# Patient Record
Sex: Female | Born: 1950 | Race: White | Hispanic: No | Marital: Married | Smoking: Former smoker
Health system: Southern US, Community
[De-identification: ages and names within clinical notes are randomized; demographics above are authoritative.]

## PROBLEM LIST (undated history)

## (undated) DIAGNOSIS — M8580 Other specified disorders of bone density and structure, unspecified site: Secondary | ICD-10-CM

## (undated) DIAGNOSIS — F32A Depression, unspecified: Secondary | ICD-10-CM

## (undated) DIAGNOSIS — F329 Major depressive disorder, single episode, unspecified: Secondary | ICD-10-CM

## (undated) DIAGNOSIS — E669 Obesity, unspecified: Secondary | ICD-10-CM

## (undated) DIAGNOSIS — K209 Esophagitis, unspecified without bleeding: Secondary | ICD-10-CM

## (undated) DIAGNOSIS — R519 Headache, unspecified: Secondary | ICD-10-CM

## (undated) DIAGNOSIS — R011 Cardiac murmur, unspecified: Secondary | ICD-10-CM

## (undated) DIAGNOSIS — I10 Essential (primary) hypertension: Secondary | ICD-10-CM

## (undated) DIAGNOSIS — G4730 Sleep apnea, unspecified: Secondary | ICD-10-CM

## (undated) DIAGNOSIS — E039 Hypothyroidism, unspecified: Secondary | ICD-10-CM

## (undated) DIAGNOSIS — R002 Palpitations: Secondary | ICD-10-CM

## (undated) DIAGNOSIS — R0600 Dyspnea, unspecified: Secondary | ICD-10-CM

## (undated) DIAGNOSIS — J45909 Unspecified asthma, uncomplicated: Secondary | ICD-10-CM

## (undated) DIAGNOSIS — J309 Allergic rhinitis, unspecified: Secondary | ICD-10-CM

## (undated) DIAGNOSIS — K219 Gastro-esophageal reflux disease without esophagitis: Secondary | ICD-10-CM

## (undated) DIAGNOSIS — K5790 Diverticulosis of intestine, part unspecified, without perforation or abscess without bleeding: Secondary | ICD-10-CM

## (undated) DIAGNOSIS — Z5189 Encounter for other specified aftercare: Secondary | ICD-10-CM

## (undated) DIAGNOSIS — E785 Hyperlipidemia, unspecified: Secondary | ICD-10-CM

## (undated) DIAGNOSIS — J939 Pneumothorax, unspecified: Secondary | ICD-10-CM

## (undated) DIAGNOSIS — F419 Anxiety disorder, unspecified: Secondary | ICD-10-CM

## (undated) DIAGNOSIS — R51 Headache: Secondary | ICD-10-CM

## (undated) HISTORY — PX: EYE SURGERY: SHX253

## (undated) HISTORY — DX: Diverticulosis of intestine, part unspecified, without perforation or abscess without bleeding: K57.90

## (undated) HISTORY — DX: Anxiety disorder, unspecified: F41.9

## (undated) HISTORY — DX: Encounter for other specified aftercare: Z51.89

## (undated) HISTORY — PX: TONSILLECTOMY: SUR1361

## (undated) HISTORY — PX: COLONOSCOPY: SHX174

## (undated) HISTORY — PX: BUNIONECTOMY: SHX129

---

## 2004-04-12 ENCOUNTER — Other Ambulatory Visit: Payer: Self-pay

## 2004-12-13 ENCOUNTER — Ambulatory Visit: Payer: Self-pay

## 2005-03-08 ENCOUNTER — Ambulatory Visit: Payer: Self-pay | Admitting: Obstetrics and Gynecology

## 2005-06-13 ENCOUNTER — Ambulatory Visit: Payer: Self-pay | Admitting: Unknown Physician Specialty

## 2005-10-10 ENCOUNTER — Inpatient Hospital Stay: Payer: Self-pay | Admitting: Internal Medicine

## 2007-02-15 ENCOUNTER — Ambulatory Visit: Payer: Self-pay | Admitting: Obstetrics and Gynecology

## 2008-02-09 ENCOUNTER — Other Ambulatory Visit: Payer: Self-pay

## 2008-02-09 ENCOUNTER — Ambulatory Visit: Payer: Self-pay | Admitting: Internal Medicine

## 2008-03-21 ENCOUNTER — Ambulatory Visit: Payer: Self-pay | Admitting: Obstetrics and Gynecology

## 2008-10-22 ENCOUNTER — Ambulatory Visit: Payer: Self-pay | Admitting: Internal Medicine

## 2009-03-25 ENCOUNTER — Ambulatory Visit: Payer: Self-pay | Admitting: Obstetrics and Gynecology

## 2009-06-09 ENCOUNTER — Ambulatory Visit: Payer: Self-pay | Admitting: Internal Medicine

## 2010-04-14 ENCOUNTER — Ambulatory Visit: Payer: Self-pay | Admitting: Obstetrics and Gynecology

## 2010-07-07 ENCOUNTER — Ambulatory Visit: Payer: Self-pay | Admitting: Internal Medicine

## 2010-09-20 ENCOUNTER — Ambulatory Visit: Payer: Self-pay | Admitting: Unknown Physician Specialty

## 2010-12-10 ENCOUNTER — Ambulatory Visit: Payer: Self-pay | Admitting: Internal Medicine

## 2011-05-08 ENCOUNTER — Observation Stay: Payer: Self-pay | Admitting: Internal Medicine

## 2011-05-23 ENCOUNTER — Ambulatory Visit: Payer: Self-pay | Admitting: Internal Medicine

## 2011-06-02 ENCOUNTER — Ambulatory Visit: Payer: Self-pay | Admitting: Obstetrics and Gynecology

## 2012-06-05 ENCOUNTER — Ambulatory Visit: Payer: Self-pay | Admitting: Obstetrics and Gynecology

## 2012-11-07 ENCOUNTER — Ambulatory Visit: Payer: Self-pay | Admitting: Internal Medicine

## 2013-05-14 ENCOUNTER — Ambulatory Visit: Payer: Self-pay | Admitting: Physician Assistant

## 2013-06-11 ENCOUNTER — Ambulatory Visit: Payer: Self-pay | Admitting: Obstetrics and Gynecology

## 2013-12-11 ENCOUNTER — Ambulatory Visit: Payer: Self-pay | Admitting: Internal Medicine

## 2013-12-19 DIAGNOSIS — M19019 Primary osteoarthritis, unspecified shoulder: Secondary | ICD-10-CM | POA: Insufficient documentation

## 2014-03-18 ENCOUNTER — Ambulatory Visit: Payer: Self-pay | Admitting: Physician Assistant

## 2014-06-12 ENCOUNTER — Ambulatory Visit: Payer: Self-pay | Admitting: Obstetrics and Gynecology

## 2014-06-18 ENCOUNTER — Ambulatory Visit: Payer: Self-pay | Admitting: Physician Assistant

## 2014-07-12 ENCOUNTER — Ambulatory Visit: Payer: Self-pay

## 2014-09-18 DIAGNOSIS — M2012 Hallux valgus (acquired), left foot: Secondary | ICD-10-CM | POA: Insufficient documentation

## 2014-09-18 DIAGNOSIS — M19072 Primary osteoarthritis, left ankle and foot: Secondary | ICD-10-CM | POA: Insufficient documentation

## 2014-11-17 DIAGNOSIS — J4541 Moderate persistent asthma with (acute) exacerbation: Secondary | ICD-10-CM | POA: Diagnosis not present

## 2014-11-17 DIAGNOSIS — J209 Acute bronchitis, unspecified: Secondary | ICD-10-CM | POA: Diagnosis not present

## 2014-11-27 DIAGNOSIS — J4541 Moderate persistent asthma with (acute) exacerbation: Secondary | ICD-10-CM | POA: Diagnosis not present

## 2014-11-27 DIAGNOSIS — N181 Chronic kidney disease, stage 1: Secondary | ICD-10-CM | POA: Diagnosis not present

## 2014-11-27 DIAGNOSIS — F411 Generalized anxiety disorder: Secondary | ICD-10-CM | POA: Diagnosis not present

## 2014-11-27 DIAGNOSIS — G43009 Migraine without aura, not intractable, without status migrainosus: Secondary | ICD-10-CM | POA: Diagnosis not present

## 2014-12-03 DIAGNOSIS — J4540 Moderate persistent asthma, uncomplicated: Secondary | ICD-10-CM | POA: Diagnosis not present

## 2014-12-03 DIAGNOSIS — G4733 Obstructive sleep apnea (adult) (pediatric): Secondary | ICD-10-CM | POA: Diagnosis not present

## 2014-12-15 DIAGNOSIS — N181 Chronic kidney disease, stage 1: Secondary | ICD-10-CM | POA: Diagnosis not present

## 2014-12-17 DIAGNOSIS — N181 Chronic kidney disease, stage 1: Secondary | ICD-10-CM | POA: Diagnosis not present

## 2014-12-17 DIAGNOSIS — G4733 Obstructive sleep apnea (adult) (pediatric): Secondary | ICD-10-CM | POA: Diagnosis not present

## 2014-12-17 DIAGNOSIS — G2581 Restless legs syndrome: Secondary | ICD-10-CM | POA: Diagnosis not present

## 2014-12-17 DIAGNOSIS — J4540 Moderate persistent asthma, uncomplicated: Secondary | ICD-10-CM | POA: Diagnosis not present

## 2014-12-24 DIAGNOSIS — G4733 Obstructive sleep apnea (adult) (pediatric): Secondary | ICD-10-CM | POA: Diagnosis not present

## 2015-03-16 DIAGNOSIS — N189 Chronic kidney disease, unspecified: Secondary | ICD-10-CM | POA: Diagnosis not present

## 2015-03-16 DIAGNOSIS — I10 Essential (primary) hypertension: Secondary | ICD-10-CM | POA: Diagnosis not present

## 2015-03-19 DIAGNOSIS — J301 Allergic rhinitis due to pollen: Secondary | ICD-10-CM | POA: Diagnosis not present

## 2015-03-19 DIAGNOSIS — M5137 Other intervertebral disc degeneration, lumbosacral region: Secondary | ICD-10-CM | POA: Diagnosis not present

## 2015-03-19 DIAGNOSIS — G2581 Restless legs syndrome: Secondary | ICD-10-CM | POA: Diagnosis not present

## 2015-03-19 DIAGNOSIS — J4540 Moderate persistent asthma, uncomplicated: Secondary | ICD-10-CM | POA: Diagnosis not present

## 2015-03-19 DIAGNOSIS — F411 Generalized anxiety disorder: Secondary | ICD-10-CM | POA: Diagnosis not present

## 2015-03-19 DIAGNOSIS — K219 Gastro-esophageal reflux disease without esophagitis: Secondary | ICD-10-CM | POA: Diagnosis not present

## 2015-03-19 DIAGNOSIS — N181 Chronic kidney disease, stage 1: Secondary | ICD-10-CM | POA: Diagnosis not present

## 2015-03-19 DIAGNOSIS — G43009 Migraine without aura, not intractable, without status migrainosus: Secondary | ICD-10-CM | POA: Diagnosis not present

## 2015-05-13 DIAGNOSIS — H1131 Conjunctival hemorrhage, right eye: Secondary | ICD-10-CM | POA: Diagnosis not present

## 2015-06-15 DIAGNOSIS — E079 Disorder of thyroid, unspecified: Secondary | ICD-10-CM | POA: Diagnosis not present

## 2015-06-15 DIAGNOSIS — N189 Chronic kidney disease, unspecified: Secondary | ICD-10-CM | POA: Diagnosis not present

## 2015-06-17 DIAGNOSIS — N181 Chronic kidney disease, stage 1: Secondary | ICD-10-CM | POA: Diagnosis not present

## 2015-06-17 DIAGNOSIS — M5137 Other intervertebral disc degeneration, lumbosacral region: Secondary | ICD-10-CM | POA: Diagnosis not present

## 2015-06-17 DIAGNOSIS — G43009 Migraine without aura, not intractable, without status migrainosus: Secondary | ICD-10-CM | POA: Diagnosis not present

## 2015-06-17 DIAGNOSIS — J4540 Moderate persistent asthma, uncomplicated: Secondary | ICD-10-CM | POA: Diagnosis not present

## 2015-06-17 DIAGNOSIS — I10 Essential (primary) hypertension: Secondary | ICD-10-CM | POA: Diagnosis not present

## 2015-06-17 DIAGNOSIS — G4733 Obstructive sleep apnea (adult) (pediatric): Secondary | ICD-10-CM | POA: Diagnosis not present

## 2015-06-17 DIAGNOSIS — Z79891 Long term (current) use of opiate analgesic: Secondary | ICD-10-CM | POA: Diagnosis not present

## 2015-07-15 DIAGNOSIS — G4733 Obstructive sleep apnea (adult) (pediatric): Secondary | ICD-10-CM | POA: Diagnosis not present

## 2015-07-21 DIAGNOSIS — G4733 Obstructive sleep apnea (adult) (pediatric): Secondary | ICD-10-CM | POA: Diagnosis not present

## 2015-07-21 DIAGNOSIS — J4540 Moderate persistent asthma, uncomplicated: Secondary | ICD-10-CM | POA: Diagnosis not present

## 2015-07-21 DIAGNOSIS — J301 Allergic rhinitis due to pollen: Secondary | ICD-10-CM | POA: Diagnosis not present

## 2015-09-14 DIAGNOSIS — N189 Chronic kidney disease, unspecified: Secondary | ICD-10-CM | POA: Diagnosis not present

## 2015-09-17 ENCOUNTER — Other Ambulatory Visit: Payer: Self-pay | Admitting: Physician Assistant

## 2015-09-17 DIAGNOSIS — F411 Generalized anxiety disorder: Secondary | ICD-10-CM | POA: Diagnosis not present

## 2015-09-17 DIAGNOSIS — M5137 Other intervertebral disc degeneration, lumbosacral region: Secondary | ICD-10-CM | POA: Diagnosis not present

## 2015-09-17 DIAGNOSIS — Z1231 Encounter for screening mammogram for malignant neoplasm of breast: Secondary | ICD-10-CM

## 2015-09-17 DIAGNOSIS — I10 Essential (primary) hypertension: Secondary | ICD-10-CM | POA: Diagnosis not present

## 2015-09-17 DIAGNOSIS — J4540 Moderate persistent asthma, uncomplicated: Secondary | ICD-10-CM | POA: Diagnosis not present

## 2015-09-17 DIAGNOSIS — E039 Hypothyroidism, unspecified: Secondary | ICD-10-CM | POA: Diagnosis not present

## 2015-09-17 DIAGNOSIS — N181 Chronic kidney disease, stage 1: Secondary | ICD-10-CM | POA: Diagnosis not present

## 2015-09-17 DIAGNOSIS — N390 Urinary tract infection, site not specified: Secondary | ICD-10-CM | POA: Diagnosis not present

## 2015-09-29 ENCOUNTER — Ambulatory Visit
Admission: RE | Admit: 2015-09-29 | Discharge: 2015-09-29 | Disposition: A | Discharge status: Home / Self Care | Payer: Medicare Other | Source: Ambulatory Visit | Attending: Physician Assistant | Admitting: Physician Assistant

## 2015-09-29 DIAGNOSIS — Z1231 Encounter for screening mammogram for malignant neoplasm of breast: Secondary | ICD-10-CM | POA: Diagnosis not present

## 2015-10-07 DIAGNOSIS — Z23 Encounter for immunization: Secondary | ICD-10-CM | POA: Diagnosis not present

## 2015-10-08 ENCOUNTER — Other Ambulatory Visit: Payer: Self-pay | Admitting: Internal Medicine

## 2015-10-08 DIAGNOSIS — R928 Other abnormal and inconclusive findings on diagnostic imaging of breast: Secondary | ICD-10-CM

## 2015-10-23 ENCOUNTER — Ambulatory Visit
Admission: RE | Admit: 2015-10-23 | Discharge: 2015-10-23 | Disposition: A | Discharge status: Home / Self Care | Payer: Medicare Other | Source: Ambulatory Visit | Attending: Internal Medicine | Admitting: Internal Medicine

## 2015-10-23 DIAGNOSIS — R928 Other abnormal and inconclusive findings on diagnostic imaging of breast: Secondary | ICD-10-CM | POA: Insufficient documentation

## 2015-11-03 ENCOUNTER — Other Ambulatory Visit: Payer: Self-pay | Admitting: Nurse Practitioner

## 2015-11-03 DIAGNOSIS — R8271 Bacteriuria: Secondary | ICD-10-CM | POA: Diagnosis not present

## 2015-11-03 DIAGNOSIS — R1032 Left lower quadrant pain: Secondary | ICD-10-CM | POA: Insufficient documentation

## 2015-11-03 DIAGNOSIS — Z86010 Personal history of colonic polyps: Secondary | ICD-10-CM | POA: Insufficient documentation

## 2015-11-04 DIAGNOSIS — N181 Chronic kidney disease, stage 1: Secondary | ICD-10-CM | POA: Diagnosis not present

## 2015-11-04 DIAGNOSIS — G4700 Insomnia, unspecified: Secondary | ICD-10-CM | POA: Diagnosis not present

## 2015-11-04 DIAGNOSIS — F411 Generalized anxiety disorder: Secondary | ICD-10-CM | POA: Diagnosis not present

## 2015-11-04 DIAGNOSIS — K5900 Constipation, unspecified: Secondary | ICD-10-CM | POA: Diagnosis not present

## 2015-11-04 DIAGNOSIS — G43009 Migraine without aura, not intractable, without status migrainosus: Secondary | ICD-10-CM | POA: Diagnosis not present

## 2015-11-04 DIAGNOSIS — Z0001 Encounter for general adult medical examination with abnormal findings: Secondary | ICD-10-CM | POA: Diagnosis not present

## 2015-11-04 DIAGNOSIS — J4540 Moderate persistent asthma, uncomplicated: Secondary | ICD-10-CM | POA: Diagnosis not present

## 2015-11-04 DIAGNOSIS — M5137 Other intervertebral disc degeneration, lumbosacral region: Secondary | ICD-10-CM | POA: Diagnosis not present

## 2015-11-06 ENCOUNTER — Ambulatory Visit: Payer: Medicare Other

## 2015-11-10 ENCOUNTER — Ambulatory Visit
Admission: RE | Admit: 2015-11-10 | Discharge: 2015-11-10 | Disposition: A | Discharge status: Home / Self Care | Payer: Medicare Other | Source: Ambulatory Visit | Attending: Nurse Practitioner | Admitting: Nurse Practitioner

## 2015-11-10 DIAGNOSIS — R1032 Left lower quadrant pain: Secondary | ICD-10-CM | POA: Diagnosis not present

## 2015-11-10 DIAGNOSIS — K760 Fatty (change of) liver, not elsewhere classified: Secondary | ICD-10-CM | POA: Insufficient documentation

## 2015-11-10 HISTORY — DX: Unspecified asthma, uncomplicated: J45.909

## 2015-11-10 HISTORY — DX: Essential (primary) hypertension: I10

## 2015-11-10 MED ORDER — IOHEXOL 300 MG/ML  SOLN
100.0000 mL | Freq: Once | INTRAMUSCULAR | Status: AC | PRN
  Administered 2015-11-10: 100 mL via INTRAVENOUS

## 2015-11-26 ENCOUNTER — Encounter
Admission: RE | Disposition: A | Discharge status: Home / Self Care | Payer: Self-pay | Source: Ambulatory Visit | Attending: Unknown Physician Specialty

## 2015-11-26 ENCOUNTER — Ambulatory Visit: Payer: Medicare Other | Admitting: Anesthesiology

## 2015-11-26 ENCOUNTER — Ambulatory Visit
Admission: RE | Admit: 2015-11-26 | Discharge: 2015-11-26 | Disposition: A | Discharge status: Home / Self Care | Payer: Medicare Other | Source: Ambulatory Visit | Attending: Unknown Physician Specialty | Admitting: Unknown Physician Specialty

## 2015-11-26 ENCOUNTER — Encounter: Payer: Self-pay | Admitting: Anesthesiology

## 2015-11-26 DIAGNOSIS — Z833 Family history of diabetes mellitus: Secondary | ICD-10-CM | POA: Diagnosis not present

## 2015-11-26 DIAGNOSIS — E079 Disorder of thyroid, unspecified: Secondary | ICD-10-CM | POA: Diagnosis not present

## 2015-11-26 DIAGNOSIS — Z803 Family history of malignant neoplasm of breast: Secondary | ICD-10-CM | POA: Insufficient documentation

## 2015-11-26 DIAGNOSIS — J309 Allergic rhinitis, unspecified: Secondary | ICD-10-CM | POA: Insufficient documentation

## 2015-11-26 DIAGNOSIS — Z882 Allergy status to sulfonamides status: Secondary | ICD-10-CM | POA: Diagnosis not present

## 2015-11-26 DIAGNOSIS — K640 First degree hemorrhoids: Secondary | ICD-10-CM | POA: Diagnosis not present

## 2015-11-26 DIAGNOSIS — F329 Major depressive disorder, single episode, unspecified: Secondary | ICD-10-CM | POA: Diagnosis not present

## 2015-11-26 DIAGNOSIS — M19019 Primary osteoarthritis, unspecified shoulder: Secondary | ICD-10-CM | POA: Diagnosis not present

## 2015-11-26 DIAGNOSIS — G4730 Sleep apnea, unspecified: Secondary | ICD-10-CM | POA: Insufficient documentation

## 2015-11-26 DIAGNOSIS — I10 Essential (primary) hypertension: Secondary | ICD-10-CM | POA: Diagnosis not present

## 2015-11-26 DIAGNOSIS — Z885 Allergy status to narcotic agent status: Secondary | ICD-10-CM | POA: Insufficient documentation

## 2015-11-26 DIAGNOSIS — K5730 Diverticulosis of large intestine without perforation or abscess without bleeding: Secondary | ICD-10-CM | POA: Diagnosis not present

## 2015-11-26 DIAGNOSIS — R1032 Left lower quadrant pain: Secondary | ICD-10-CM | POA: Diagnosis not present

## 2015-11-26 DIAGNOSIS — Z888 Allergy status to other drugs, medicaments and biological substances status: Secondary | ICD-10-CM | POA: Insufficient documentation

## 2015-11-26 DIAGNOSIS — J45909 Unspecified asthma, uncomplicated: Secondary | ICD-10-CM | POA: Insufficient documentation

## 2015-11-26 DIAGNOSIS — Z808 Family history of malignant neoplasm of other organs or systems: Secondary | ICD-10-CM | POA: Insufficient documentation

## 2015-11-26 DIAGNOSIS — K648 Other hemorrhoids: Secondary | ICD-10-CM | POA: Diagnosis not present

## 2015-11-26 DIAGNOSIS — Z8042 Family history of malignant neoplasm of prostate: Secondary | ICD-10-CM | POA: Diagnosis not present

## 2015-11-26 DIAGNOSIS — Z8052 Family history of malignant neoplasm of bladder: Secondary | ICD-10-CM | POA: Insufficient documentation

## 2015-11-26 DIAGNOSIS — Z6829 Body mass index (BMI) 29.0-29.9, adult: Secondary | ICD-10-CM | POA: Diagnosis not present

## 2015-11-26 DIAGNOSIS — Z886 Allergy status to analgesic agent status: Secondary | ICD-10-CM | POA: Insufficient documentation

## 2015-11-26 DIAGNOSIS — Z86010 Personal history of colonic polyps: Secondary | ICD-10-CM | POA: Insufficient documentation

## 2015-11-26 DIAGNOSIS — Z800 Family history of malignant neoplasm of digestive organs: Secondary | ICD-10-CM | POA: Diagnosis not present

## 2015-11-26 DIAGNOSIS — Z79899 Other long term (current) drug therapy: Secondary | ICD-10-CM | POA: Insufficient documentation

## 2015-11-26 DIAGNOSIS — D122 Benign neoplasm of ascending colon: Secondary | ICD-10-CM | POA: Diagnosis not present

## 2015-11-26 DIAGNOSIS — E669 Obesity, unspecified: Secondary | ICD-10-CM | POA: Insufficient documentation

## 2015-11-26 DIAGNOSIS — E785 Hyperlipidemia, unspecified: Secondary | ICD-10-CM | POA: Diagnosis not present

## 2015-11-26 DIAGNOSIS — M8580 Other specified disorders of bone density and structure, unspecified site: Secondary | ICD-10-CM | POA: Diagnosis not present

## 2015-11-26 DIAGNOSIS — Z8249 Family history of ischemic heart disease and other diseases of the circulatory system: Secondary | ICD-10-CM | POA: Insufficient documentation

## 2015-11-26 DIAGNOSIS — Z87891 Personal history of nicotine dependence: Secondary | ICD-10-CM | POA: Diagnosis not present

## 2015-11-26 DIAGNOSIS — Z9889 Other specified postprocedural states: Secondary | ICD-10-CM | POA: Diagnosis not present

## 2015-11-26 DIAGNOSIS — K635 Polyp of colon: Secondary | ICD-10-CM | POA: Diagnosis not present

## 2015-11-26 DIAGNOSIS — Z7951 Long term (current) use of inhaled steroids: Secondary | ICD-10-CM | POA: Diagnosis not present

## 2015-11-26 DIAGNOSIS — K5790 Diverticulosis of intestine, part unspecified, without perforation or abscess without bleeding: Secondary | ICD-10-CM | POA: Diagnosis not present

## 2015-11-26 HISTORY — DX: Sleep apnea, unspecified: G47.30

## 2015-11-26 HISTORY — DX: Hypothyroidism, unspecified: E03.9

## 2015-11-26 HISTORY — DX: Pneumothorax, unspecified: J93.9

## 2015-11-26 HISTORY — DX: Headache: R51

## 2015-11-26 HISTORY — DX: Major depressive disorder, single episode, unspecified: F32.9

## 2015-11-26 HISTORY — DX: Esophagitis, unspecified: K20.9

## 2015-11-26 HISTORY — DX: Depression, unspecified: F32.A

## 2015-11-26 HISTORY — DX: Obesity, unspecified: E66.9

## 2015-11-26 HISTORY — DX: Esophagitis, unspecified without bleeding: K20.90

## 2015-11-26 HISTORY — DX: Headache, unspecified: R51.9

## 2015-11-26 HISTORY — DX: Other specified disorders of bone density and structure, unspecified site: M85.80

## 2015-11-26 HISTORY — DX: Allergic rhinitis, unspecified: J30.9

## 2015-11-26 HISTORY — DX: Hyperlipidemia, unspecified: E78.5

## 2015-11-26 HISTORY — PX: COLONOSCOPY WITH PROPOFOL: SHX5780

## 2015-11-26 SURGERY — COLONOSCOPY WITH PROPOFOL
Anesthesia: General

## 2015-11-26 MED ORDER — PROPOFOL 500 MG/50ML IV EMUL
INTRAVENOUS | Status: DC | PRN
  Administered 2015-11-26: 120 ug/kg/min via INTRAVENOUS

## 2015-11-26 MED ORDER — LIDOCAINE HCL (PF) 1 % IJ SOLN
2.0000 mL | Freq: Once | INTRAMUSCULAR | Status: AC
  Administered 2015-11-26: 0.3 mL via INTRADERMAL
  Filled 2015-11-26: qty 2

## 2015-11-26 MED ORDER — FENTANYL CITRATE (PF) 100 MCG/2ML IJ SOLN
INTRAMUSCULAR | Status: DC | PRN
  Administered 2015-11-26: 25 ug via INTRAVENOUS
  Administered 2015-11-26: 50 ug via INTRAVENOUS
  Administered 2015-11-26: 25 ug via INTRAVENOUS

## 2015-11-26 MED ORDER — MIDAZOLAM HCL 2 MG/2ML IJ SOLN
INTRAMUSCULAR | Status: DC | PRN
  Administered 2015-11-26: 2 mg via INTRAVENOUS

## 2015-11-26 MED ORDER — SODIUM CHLORIDE 0.9 % IV SOLN
INTRAVENOUS | Status: DC
  Administered 2015-11-26: 1000 mL via INTRAVENOUS

## 2015-11-26 MED ORDER — EPHEDRINE SULFATE 50 MG/ML IJ SOLN
INTRAMUSCULAR | Status: DC | PRN
  Administered 2015-11-26: 5 mg via INTRAVENOUS

## 2015-11-26 NOTE — Anesthesia Procedure Notes (Signed)
Performed by: COOK-MARTIN, Ludwig Tugwell Pre-anesthesia Checklist: Patient identified, Emergency Drugs available, Suction available, Patient being monitored and Timeout performed Patient Re-evaluated:Patient Re-evaluated prior to inductionOxygen Delivery Method: Nasal cannula Preoxygenation: Pre-oxygenation with 100% oxygen Intubation Type: IV induction Placement Confirmation: positive ETCO2 and CO2 detector       

## 2015-11-26 NOTE — Anesthesia Postprocedure Evaluation (Signed)
Anesthesia Post Note  Patient: Bonnie West  Procedure(s) Performed: Procedure(s) (LRB): COLONOSCOPY WITH PROPOFOL (N/A)  Patient location during evaluation: PACU Anesthesia Type: General Level of consciousness: awake and alert Pain management: pain level controlled Vital Signs Assessment: post-procedure vital signs reviewed and stable Respiratory status: spontaneous breathing and respiratory function stable Cardiovascular status: stable Anesthetic complications: no    Last Vitals:  Filed Vitals:   11/26/15 1330 11/26/15 1500  BP: 130/74 92/65  Pulse: 85 72  Temp: 36.8 C 35.7 C  Resp: 17 16    Last Pain: There were no vitals filed for this visit.               Dalary Hollar K

## 2015-11-26 NOTE — Transfer of Care (Signed)
Immediate Anesthesia Transfer of Care Note  Patient: Bonnie West  Procedure(s) Performed: Procedure(s): COLONOSCOPY WITH PROPOFOL (N/A)  Patient Location: PACU  Anesthesia Type:General  Level of Consciousness: awake and sedated  Airway & Oxygen Therapy: Patient Spontanous Breathing and Patient connected to nasal cannula oxygen  Post-op Assessment: Report given to RN and Post -op Vital signs reviewed and stable  Post vital signs: Reviewed and stable  Last Vitals:  Filed Vitals:   11/26/15 1330 11/26/15 1500  BP: 130/74 92/65  Pulse: 85 72  Temp: 36.8 C 35.7 C  Resp: 17 16    Complications: No apparent anesthesia complications

## 2015-11-26 NOTE — Anesthesia Preprocedure Evaluation (Signed)
Anesthesia Evaluation  Patient identified by MRN, date of birth, ID band Patient awake    Reviewed: Allergy & Precautions, H&P , NPO status , Patient's Chart, lab work & pertinent test results, reviewed documented beta blocker date and time   History of Anesthesia Complications Negative for: history of anesthetic complications  Airway Mallampati: I  TM Distance: >3 FB Neck ROM: full    Dental no notable dental hx. (+) Caps, Teeth Intact Permanent bridge:   Pulmonary neg shortness of breath, asthma , sleep apnea and Continuous Positive Airway Pressure Ventilation , neg COPD, neg recent URI, former smoker,    Pulmonary exam normal breath sounds clear to auscultation       Cardiovascular Exercise Tolerance: Good hypertension, (-) angina(-) CAD, (-) Past MI, (-) Cardiac Stents and (-) CABG Normal cardiovascular exam(-) dysrhythmias + Valvular Problems/Murmurs  Rhythm:regular Rate:Normal     Neuro/Psych PSYCHIATRIC DISORDERS (Depression) negative neurological ROS     GI/Hepatic Neg liver ROS, GERD  Medicated and Controlled,  Endo/Other  neg diabetesHypothyroidism   Renal/GU CRFRenal disease  negative genitourinary   Musculoskeletal   Abdominal   Peds  Hematology negative hematology ROS (+)   Anesthesia Other Findings Past Medical History:   Asthma                                                       Hypertension                                                 Abdominal pain                                               Allergic rhinitis                                            Depression                                                   Hyperlipemia                                                 Headache                                                     Obesity  Osteopenia                                                   Pneumothorax                                                  Esophagitis                                                  Sleep apnea                                                  Hypothyroidism                                               Reproductive/Obstetrics negative OB ROS                             Anesthesia Physical Anesthesia Plan  ASA: III  Anesthesia Plan: General   Post-op Pain Management:    Induction:   Airway Management Planned:   Additional Equipment:   Intra-op Plan:   Post-operative Plan:   Informed Consent: I have reviewed the patients History and Physical, chart, labs and discussed the procedure including the risks, benefits and alternatives for the proposed anesthesia with the patient or authorized representative who has indicated his/her understanding and acceptance.   Dental Advisory Given  Plan Discussed with: Anesthesiologist, CRNA and Surgeon  Anesthesia Plan Comments:         Anesthesia Quick Evaluation

## 2015-11-26 NOTE — Op Note (Signed)
[facility] Patient Name: Bonnie West Procedure Date: 11/26/2015 2:23 PM MRN: [id number] Account #: [id number] Date of Birth: [birth year] Admit Type: Outpatient Age: 65 Room: [facility] ENDO ROOM 1 Gender: Female Note Status: Finalized Procedure:         Colonoscopy Indications:       High risk colon cancer surveillance: Personal history of                     colonic polyps Providers:         Manya Silvas, MD Referring MD:      Lavera Guise, MD (Referring MD) Medicines:         Propofol per Anesthesia Complications:     No immediate complications. Procedure:         Pre-Anesthesia Assessment:                    - After reviewing the risks and benefits, the patient was                     deemed in satisfactory condition to undergo the procedure.                    After obtaining informed consent, the colonoscope was                     passed under direct vision. Throughout the procedure, the                     patient's blood pressure, pulse, and oxygen saturations                     were monitored continuously. The Colonoscope was                     introduced through the anus and advanced to the the cecum,                     identified by appendiceal orifice and ileocecal valve. The                     colonoscopy was somewhat difficult due to a tortuous                     colon. Successful completion of the procedure was aided by                     applying abdominal pressure. The patient tolerated the                     procedure well. The quality of the bowel preparation was                     good. Findings:      A diminutive polyp was found in the ascending colon. The polyp was       sessile. The polyp was removed with a jumbo cold forceps. Resection and       retrieval were complete.      Many small and large-mouthed diverticula were found in the sigmoid colon       and in the descending colon.      Internal hemorrhoids were  found during endoscopy. The hemorrhoids were       small and Grade I (internal hemorrhoids that do not  prolapse).      The exam was otherwise without abnormality. Impression:        - One diminutive polyp in the ascending colon. Resected                     and retrieved.                    - Diverticulosis in the sigmoid colon and in the                     descending colon.                    - Internal hemorrhoids.                    - The examination was otherwise normal. Recommendation:    - Await pathology results. Manya Silvas, MD 11/26/2015 2:55:47 PM This report has been signed electronically. Number of Addenda: 0 Note Initiated On: 11/26/2015 2:23 PM Scope Withdrawal Time: 0 hours 9 minutes 22 seconds  Total Procedure Duration: 0 hours 21 minutes 44 seconds       [facility]

## 2015-11-30 LAB — SURGICAL PATHOLOGY

## 2015-12-10 DIAGNOSIS — K760 Fatty (change of) liver, not elsewhere classified: Secondary | ICD-10-CM | POA: Insufficient documentation

## 2015-12-10 DIAGNOSIS — R1032 Left lower quadrant pain: Secondary | ICD-10-CM | POA: Diagnosis not present

## 2015-12-14 DIAGNOSIS — K760 Fatty (change of) liver, not elsewhere classified: Secondary | ICD-10-CM | POA: Diagnosis not present

## 2016-01-01 DIAGNOSIS — E139 Other specified diabetes mellitus without complications: Secondary | ICD-10-CM | POA: Diagnosis not present

## 2016-01-05 DIAGNOSIS — N390 Urinary tract infection, site not specified: Secondary | ICD-10-CM | POA: Diagnosis not present

## 2016-01-13 DIAGNOSIS — G4733 Obstructive sleep apnea (adult) (pediatric): Secondary | ICD-10-CM | POA: Diagnosis not present

## 2016-01-20 DIAGNOSIS — E039 Hypothyroidism, unspecified: Secondary | ICD-10-CM | POA: Diagnosis not present

## 2016-02-02 DIAGNOSIS — G4733 Obstructive sleep apnea (adult) (pediatric): Secondary | ICD-10-CM | POA: Diagnosis not present

## 2016-02-02 DIAGNOSIS — J301 Allergic rhinitis due to pollen: Secondary | ICD-10-CM | POA: Diagnosis not present

## 2016-02-02 DIAGNOSIS — J4540 Moderate persistent asthma, uncomplicated: Secondary | ICD-10-CM | POA: Diagnosis not present

## 2016-03-31 DIAGNOSIS — J4540 Moderate persistent asthma, uncomplicated: Secondary | ICD-10-CM | POA: Diagnosis not present

## 2016-03-31 DIAGNOSIS — J301 Allergic rhinitis due to pollen: Secondary | ICD-10-CM | POA: Diagnosis not present

## 2016-03-31 DIAGNOSIS — M5137 Other intervertebral disc degeneration, lumbosacral region: Secondary | ICD-10-CM | POA: Diagnosis not present

## 2016-03-31 DIAGNOSIS — E039 Hypothyroidism, unspecified: Secondary | ICD-10-CM | POA: Diagnosis not present

## 2016-03-31 DIAGNOSIS — I10 Essential (primary) hypertension: Secondary | ICD-10-CM | POA: Diagnosis not present

## 2016-03-31 DIAGNOSIS — F411 Generalized anxiety disorder: Secondary | ICD-10-CM | POA: Diagnosis not present

## 2016-03-31 DIAGNOSIS — K219 Gastro-esophageal reflux disease without esophagitis: Secondary | ICD-10-CM | POA: Diagnosis not present

## 2016-04-27 DIAGNOSIS — R0602 Shortness of breath: Secondary | ICD-10-CM | POA: Diagnosis not present

## 2016-04-27 DIAGNOSIS — J449 Chronic obstructive pulmonary disease, unspecified: Secondary | ICD-10-CM | POA: Diagnosis not present

## 2016-04-27 DIAGNOSIS — J4541 Moderate persistent asthma with (acute) exacerbation: Secondary | ICD-10-CM | POA: Diagnosis not present

## 2016-04-27 DIAGNOSIS — J069 Acute upper respiratory infection, unspecified: Secondary | ICD-10-CM | POA: Diagnosis not present

## 2016-06-07 DIAGNOSIS — G4733 Obstructive sleep apnea (adult) (pediatric): Secondary | ICD-10-CM | POA: Diagnosis not present

## 2016-06-07 DIAGNOSIS — J4541 Moderate persistent asthma with (acute) exacerbation: Secondary | ICD-10-CM | POA: Diagnosis not present

## 2016-06-28 DIAGNOSIS — H43811 Vitreous degeneration, right eye: Secondary | ICD-10-CM | POA: Diagnosis not present

## 2016-07-15 DIAGNOSIS — H43811 Vitreous degeneration, right eye: Secondary | ICD-10-CM | POA: Diagnosis not present

## 2016-08-01 DIAGNOSIS — E039 Hypothyroidism, unspecified: Secondary | ICD-10-CM | POA: Diagnosis not present

## 2016-08-01 DIAGNOSIS — N189 Chronic kidney disease, unspecified: Secondary | ICD-10-CM | POA: Diagnosis not present

## 2016-08-04 DIAGNOSIS — R7301 Impaired fasting glucose: Secondary | ICD-10-CM | POA: Diagnosis not present

## 2016-08-04 DIAGNOSIS — I10 Essential (primary) hypertension: Secondary | ICD-10-CM | POA: Diagnosis not present

## 2016-08-04 DIAGNOSIS — E039 Hypothyroidism, unspecified: Secondary | ICD-10-CM | POA: Diagnosis not present

## 2016-08-04 DIAGNOSIS — F411 Generalized anxiety disorder: Secondary | ICD-10-CM | POA: Diagnosis not present

## 2016-08-04 DIAGNOSIS — J4540 Moderate persistent asthma, uncomplicated: Secondary | ICD-10-CM | POA: Diagnosis not present

## 2016-08-04 DIAGNOSIS — E782 Mixed hyperlipidemia: Secondary | ICD-10-CM | POA: Diagnosis not present

## 2016-08-04 DIAGNOSIS — N181 Chronic kidney disease, stage 1: Secondary | ICD-10-CM | POA: Diagnosis not present

## 2016-08-04 DIAGNOSIS — M5137 Other intervertebral disc degeneration, lumbosacral region: Secondary | ICD-10-CM | POA: Diagnosis not present

## 2016-08-19 DIAGNOSIS — H2513 Age-related nuclear cataract, bilateral: Secondary | ICD-10-CM | POA: Diagnosis not present

## 2016-09-01 DIAGNOSIS — E039 Hypothyroidism, unspecified: Secondary | ICD-10-CM | POA: Diagnosis not present

## 2016-09-01 DIAGNOSIS — E782 Mixed hyperlipidemia: Secondary | ICD-10-CM | POA: Diagnosis not present

## 2016-09-01 DIAGNOSIS — F411 Generalized anxiety disorder: Secondary | ICD-10-CM | POA: Diagnosis not present

## 2016-09-01 DIAGNOSIS — I10 Essential (primary) hypertension: Secondary | ICD-10-CM | POA: Diagnosis not present

## 2016-09-01 DIAGNOSIS — Z79899 Other long term (current) drug therapy: Secondary | ICD-10-CM | POA: Diagnosis not present

## 2016-09-01 DIAGNOSIS — Z79891 Long term (current) use of opiate analgesic: Secondary | ICD-10-CM | POA: Diagnosis not present

## 2016-09-06 DIAGNOSIS — G4733 Obstructive sleep apnea (adult) (pediatric): Secondary | ICD-10-CM | POA: Diagnosis not present

## 2016-09-06 DIAGNOSIS — G43009 Migraine without aura, not intractable, without status migrainosus: Secondary | ICD-10-CM | POA: Diagnosis not present

## 2016-09-06 DIAGNOSIS — J449 Chronic obstructive pulmonary disease, unspecified: Secondary | ICD-10-CM | POA: Diagnosis not present

## 2016-09-07 DIAGNOSIS — H2513 Age-related nuclear cataract, bilateral: Secondary | ICD-10-CM | POA: Diagnosis not present

## 2016-09-15 DIAGNOSIS — J4541 Moderate persistent asthma with (acute) exacerbation: Secondary | ICD-10-CM | POA: Diagnosis not present

## 2016-09-19 ENCOUNTER — Ambulatory Visit: Admit: 2016-09-19 | Payer: Medicare Other | Admitting: Ophthalmology

## 2016-09-19 SURGERY — PHACOEMULSIFICATION, CATARACT, WITH IOL INSERTION
Anesthesia: Topical | Laterality: Right

## 2016-09-22 ENCOUNTER — Other Ambulatory Visit: Payer: Self-pay | Admitting: Internal Medicine

## 2016-09-22 ENCOUNTER — Ambulatory Visit
Admission: RE | Admit: 2016-09-22 | Discharge: 2016-09-22 | Disposition: A | Discharge status: Home / Self Care | Payer: Medicare Other | Source: Ambulatory Visit | Attending: Internal Medicine | Admitting: Internal Medicine

## 2016-09-22 DIAGNOSIS — R059 Cough, unspecified: Secondary | ICD-10-CM

## 2016-09-22 DIAGNOSIS — R05 Cough: Secondary | ICD-10-CM | POA: Insufficient documentation

## 2016-09-22 DIAGNOSIS — R0602 Shortness of breath: Secondary | ICD-10-CM | POA: Diagnosis not present

## 2016-09-22 DIAGNOSIS — J4541 Moderate persistent asthma with (acute) exacerbation: Secondary | ICD-10-CM | POA: Diagnosis not present

## 2016-10-06 ENCOUNTER — Encounter: Payer: Self-pay | Admitting: *Deleted

## 2016-10-11 ENCOUNTER — Encounter: Payer: Self-pay | Admitting: *Deleted

## 2016-10-11 ENCOUNTER — Ambulatory Visit: Payer: Medicare Other | Admitting: Certified Registered Nurse Anesthetist

## 2016-10-11 ENCOUNTER — Encounter
Admission: RE | Disposition: A | Discharge status: Home / Self Care | Payer: Self-pay | Source: Ambulatory Visit | Attending: Ophthalmology

## 2016-10-11 ENCOUNTER — Ambulatory Visit
Admission: RE | Admit: 2016-10-11 | Discharge: 2016-10-11 | Disposition: A | Discharge status: Home / Self Care | Payer: Medicare Other | Source: Ambulatory Visit | Attending: Ophthalmology | Admitting: Ophthalmology

## 2016-10-11 DIAGNOSIS — Z885 Allergy status to narcotic agent status: Secondary | ICD-10-CM | POA: Insufficient documentation

## 2016-10-11 DIAGNOSIS — M8580 Other specified disorders of bone density and structure, unspecified site: Secondary | ICD-10-CM | POA: Diagnosis not present

## 2016-10-11 DIAGNOSIS — H2511 Age-related nuclear cataract, right eye: Secondary | ICD-10-CM | POA: Diagnosis not present

## 2016-10-11 DIAGNOSIS — E1136 Type 2 diabetes mellitus with diabetic cataract: Secondary | ICD-10-CM | POA: Diagnosis not present

## 2016-10-11 DIAGNOSIS — G4730 Sleep apnea, unspecified: Secondary | ICD-10-CM | POA: Insufficient documentation

## 2016-10-11 DIAGNOSIS — K219 Gastro-esophageal reflux disease without esophagitis: Secondary | ICD-10-CM | POA: Insufficient documentation

## 2016-10-11 DIAGNOSIS — J45909 Unspecified asthma, uncomplicated: Secondary | ICD-10-CM | POA: Insufficient documentation

## 2016-10-11 DIAGNOSIS — E039 Hypothyroidism, unspecified: Secondary | ICD-10-CM | POA: Diagnosis not present

## 2016-10-11 DIAGNOSIS — R011 Cardiac murmur, unspecified: Secondary | ICD-10-CM | POA: Diagnosis not present

## 2016-10-11 DIAGNOSIS — Z881 Allergy status to other antibiotic agents status: Secondary | ICD-10-CM | POA: Insufficient documentation

## 2016-10-11 DIAGNOSIS — Z888 Allergy status to other drugs, medicaments and biological substances status: Secondary | ICD-10-CM | POA: Diagnosis not present

## 2016-10-11 DIAGNOSIS — Z882 Allergy status to sulfonamides status: Secondary | ICD-10-CM | POA: Diagnosis not present

## 2016-10-11 DIAGNOSIS — N189 Chronic kidney disease, unspecified: Secondary | ICD-10-CM | POA: Insufficient documentation

## 2016-10-11 DIAGNOSIS — F329 Major depressive disorder, single episode, unspecified: Secondary | ICD-10-CM | POA: Diagnosis not present

## 2016-10-11 DIAGNOSIS — I129 Hypertensive chronic kidney disease with stage 1 through stage 4 chronic kidney disease, or unspecified chronic kidney disease: Secondary | ICD-10-CM | POA: Diagnosis not present

## 2016-10-11 DIAGNOSIS — E7800 Pure hypercholesterolemia, unspecified: Secondary | ICD-10-CM | POA: Insufficient documentation

## 2016-10-11 DIAGNOSIS — I10 Essential (primary) hypertension: Secondary | ICD-10-CM | POA: Diagnosis not present

## 2016-10-11 HISTORY — PX: CATARACT EXTRACTION W/PHACO: SHX586

## 2016-10-11 HISTORY — DX: Dyspnea, unspecified: R06.00

## 2016-10-11 HISTORY — DX: Palpitations: R00.2

## 2016-10-11 HISTORY — DX: Cardiac murmur, unspecified: R01.1

## 2016-10-11 SURGERY — PHACOEMULSIFICATION, CATARACT, WITH IOL INSERTION
Anesthesia: Monitor Anesthesia Care | Site: Eye | Laterality: Right | Wound class: Clean

## 2016-10-11 MED ORDER — LIDOCAINE HCL (PF) 4 % IJ SOLN
INTRAMUSCULAR | Status: AC
  Filled 2016-10-11: qty 5

## 2016-10-11 MED ORDER — SODIUM CHLORIDE 0.9 % IV SOLN
INTRAVENOUS | Status: DC
  Administered 2016-10-11: 10:00:00 via INTRAVENOUS

## 2016-10-11 MED ORDER — ARMC OPHTHALMIC DILATING DROPS
1.0000 "application " | OPHTHALMIC | Status: AC
  Administered 2016-10-11 (×3): 1 via OPHTHALMIC

## 2016-10-11 MED ORDER — MOXIFLOXACIN HCL 0.5 % OP SOLN: 1.0000 [drp] | OPHTHALMIC | Status: DC

## 2016-10-11 MED ORDER — FENTANYL CITRATE (PF) 100 MCG/2ML IJ SOLN
INTRAMUSCULAR | Status: DC | PRN
Start: 2016-10-11 — End: 2016-10-11
  Administered 2016-10-11: 50 ug via INTRAVENOUS

## 2016-10-11 MED ORDER — ARMC OPHTHALMIC DILATING DROPS: 1.0000 "application " | OPHTHALMIC | Status: AC

## 2016-10-11 MED ORDER — NA CHONDROIT SULF-NA HYALURON 40-17 MG/ML IO SOLN
INTRAOCULAR | Status: AC
  Filled 2016-10-11: qty 1

## 2016-10-11 MED ORDER — MIDAZOLAM HCL 2 MG/2ML IJ SOLN
INTRAMUSCULAR | Status: DC | PRN
  Administered 2016-10-11: 1 mg via INTRAVENOUS

## 2016-10-11 MED ORDER — FENTANYL CITRATE (PF) 100 MCG/2ML IJ SOLN: 25.0000 ug | INTRAMUSCULAR | Status: DC | PRN

## 2016-10-11 MED ORDER — LIDOCAINE HCL (PF) 4 % IJ SOLN
INTRAOCULAR | Status: DC | PRN
  Administered 2016-10-11: 4 mL via OPHTHALMIC

## 2016-10-11 MED ORDER — EPINEPHRINE PF 1 MG/ML IJ SOLN
INTRAMUSCULAR | Status: AC
  Filled 2016-10-11: qty 2

## 2016-10-11 MED ORDER — MOXIFLOXACIN HCL 0.5 % OP SOLN
1.0000 [drp] | OPHTHALMIC | Status: AC
  Administered 2016-10-11 (×3): 1 [drp] via OPHTHALMIC

## 2016-10-11 MED ORDER — MOXIFLOXACIN HCL 0.5 % OP SOLN
OPHTHALMIC | Status: AC
  Administered 2016-10-11: 1 [drp] via OPHTHALMIC
  Filled 2016-10-11: qty 3

## 2016-10-11 MED ORDER — NA CHONDROIT SULF-NA HYALURON 40-17 MG/ML IO SOLN
INTRAOCULAR | Status: DC | PRN
  Administered 2016-10-11: 1 mL via INTRAOCULAR

## 2016-10-11 MED ORDER — MOXIFLOXACIN HCL 0.5 % OP SOLN
OPHTHALMIC | Status: DC | PRN
  Administered 2016-10-11: 9 [drp] via OPHTHALMIC

## 2016-10-11 MED ORDER — POVIDONE-IODINE 5 % OP SOLN
OPHTHALMIC | Status: AC
  Filled 2016-10-11: qty 30

## 2016-10-11 MED ORDER — EPINEPHRINE PF 1 MG/ML IJ SOLN
INTRAOCULAR | Status: DC | PRN
  Administered 2016-10-11: 200 mL via OPHTHALMIC

## 2016-10-11 MED ORDER — ARMC OPHTHALMIC DILATING DROPS
OPHTHALMIC | Status: AC
  Administered 2016-10-11: 1 via OPHTHALMIC
  Filled 2016-10-11: qty 0.4

## 2016-10-11 MED ORDER — ONDANSETRON HCL 4 MG/2ML IJ SOLN: 4.0000 mg | Freq: Once | INTRAMUSCULAR | Status: DC | PRN

## 2016-10-11 MED ORDER — CARBACHOL 0.01 % IO SOLN
INTRAOCULAR | Status: DC | PRN
  Administered 2016-10-11: 0.5 mL via INTRAOCULAR

## 2016-10-11 MED ORDER — MOXIFLOXACIN HCL 0.5 % OP SOLN
1.0000 [drp] | OPHTHALMIC | Status: AC
Start: 2016-10-11 — End: 2016-10-11

## 2016-10-11 SURGICAL SUPPLY — 21 items
CANNULA ANT/CHMB 27GA (MISCELLANEOUS) ×2 IMPLANT
CUP MEDICINE 2OZ PLAST GRAD ST (MISCELLANEOUS) ×2 IMPLANT
GLOVE BIO SURGEON STRL SZ8 (GLOVE) ×2 IMPLANT
GLOVE BIOGEL M 6.5 STRL (GLOVE) ×2 IMPLANT
GLOVE SURG LX 8.0 MICRO (GLOVE) ×1
GLOVE SURG LX STRL 8.0 MICRO (GLOVE) ×1 IMPLANT
GOWN STRL REUS W/ TWL LRG LVL3 (GOWN DISPOSABLE) ×2 IMPLANT
GOWN STRL REUS W/TWL LRG LVL3 (GOWN DISPOSABLE) ×2
LENS IOL TECNIS ITEC 18.5 (Intraocular Lens) ×2 IMPLANT
PACK CATARACT (MISCELLANEOUS) ×2 IMPLANT
PACK CATARACT BRASINGTON LX (MISCELLANEOUS) ×2 IMPLANT
PACK EYE AFTER SURG (MISCELLANEOUS) ×2 IMPLANT
SOL BSS BAG (MISCELLANEOUS) ×2
SOL PREP PVP 2OZ (MISCELLANEOUS) ×2
SOLUTION BSS BAG (MISCELLANEOUS) ×1 IMPLANT
SOLUTION PREP PVP 2OZ (MISCELLANEOUS) ×1 IMPLANT
SYR 3ML LL SCALE MARK (SYRINGE) ×2 IMPLANT
SYR 5ML LL (SYRINGE) ×2 IMPLANT
SYR TB 1ML 27GX1/2 LL (SYRINGE) ×2 IMPLANT
WATER STERILE IRR 250ML POUR (IV SOLUTION) ×2 IMPLANT
WIPE NON LINTING 3.25X3.25 (MISCELLANEOUS) ×2 IMPLANT

## 2016-10-11 NOTE — Transfer of Care (Signed)
Immediate Anesthesia Transfer of Care Note  Patient: Bonnie West  Procedure(s) Performed: Procedure(s) with comments: CATARACT EXTRACTION PHACO AND INTRAOCULAR LENS PLACEMENT ([place]) (Right) - Lot# [id number] H [place]: 00:37.7 AP%: 18.1 CDE: 6.80  Patient Location: PACU  Anesthesia Type:MAC  Level of Consciousness: awake, alert  and oriented  Airway & Oxygen Therapy: Patient Spontanous Breathing  Post-op Assessment: Report given to RN and Post -op Vital signs reviewed and stable  Post vital signs: Reviewed and stable  Last Vitals:  Vitals:   10/11/16 0955  BP: (!) 137/94  Pulse: 69  Resp: 16  Temp: 36.5 C    Last Pain:  Vitals:   10/11/16 0955  TempSrc: Oral  PainSc: 4          Complications: No apparent anesthesia complications

## 2016-10-11 NOTE — Discharge Instructions (Signed)
Eye Surgery Discharge Instructions  Expect mild scratchy sensation or mild soreness. DO NOT RUB YOUR EYE!  The day of surgery:  Minimal physical activity, but bed rest is not required  No reading, computer work, or close hand work  No bending, lifting, or straining.  May watch TV  For 24 hours:  No driving, legal decisions, or alcoholic beverages  Safety precautions  Eat anything you prefer: It is better to start with liquids, then soup then solid foods.  _____ Eye patch should be worn until postoperative exam tomorrow.  ____ Solar shield eyeglasses should be worn for comfort in the sunlight/patch while sleeping  Resume all regular medications including aspirin or Coumadin if these were discontinued prior to surgery. You may shower, bathe, shave, or wash your hair. Tylenol may be taken for mild discomfort.  Call your doctor if you experience significant pain, nausea, or vomiting, fever > 101 or other signs of infection. [phone] or [phone] Specific instructions:  Follow-up Information    PORFILIO,WILLIAM LOUIS, MD Follow up.   Specialty:  Ophthalmology Why:  November 22 at 10:40am Contact information: [address] [place] [address] [phone]

## 2016-10-11 NOTE — H&P (Signed)
All labs reviewed. Abnormal studies sent to patients PCP when indicated.  Previous H&P reviewed, patient examined, there are NO CHANGES.  [name]11/21/201710:41 AM

## 2016-10-11 NOTE — Anesthesia Preprocedure Evaluation (Signed)
Anesthesia Evaluation  Patient identified by MRN, date of birth, ID band Patient awake    Reviewed: Allergy & Precautions, H&P , NPO status , Patient's Chart, lab work & pertinent test results, reviewed documented beta blocker date and time   History of Anesthesia Complications Negative for: history of anesthetic complications  Airway Mallampati: I  TM Distance: >3 FB Neck ROM: full    Dental no notable dental hx. (+) Caps, Teeth Intact Permanent bridge:   Pulmonary neg shortness of breath, asthma , sleep apnea and Continuous Positive Airway Pressure Ventilation , neg COPD, neg recent URI, former smoker,    Pulmonary exam normal breath sounds clear to auscultation       Cardiovascular Exercise Tolerance: Good hypertension, (-) angina(-) CAD, (-) Past MI, (-) Cardiac Stents and (-) CABG Normal cardiovascular exam(-) dysrhythmias + Valvular Problems/Murmurs  Rhythm:regular Rate:Normal     Neuro/Psych PSYCHIATRIC DISORDERS (Depression) negative neurological ROS     GI/Hepatic Neg liver ROS, GERD  Medicated and Controlled,  Endo/Other  neg diabetesHypothyroidism   Renal/GU CRFRenal disease  negative genitourinary   Musculoskeletal   Abdominal   Peds  Hematology negative hematology ROS (+)   Anesthesia Other Findings Past Medical History:   Asthma                                                       Hypertension                                                 Abdominal pain                                               Allergic rhinitis                                            Depression                                                   Hyperlipemia                                                 Headache                                                     Obesity  Osteopenia                                                   Pneumothorax                                                  Esophagitis                                                  Sleep apnea                                                  Hypothyroidism                                               Reproductive/Obstetrics negative OB ROS                             Anesthesia Physical  Anesthesia Plan  ASA: III  Anesthesia Plan: MAC   Post-op Pain Management:    Induction: Intravenous  Airway Management Planned: Nasal Cannula  Additional Equipment:   Intra-op Plan:   Post-operative Plan:   Informed Consent: I have reviewed the patients History and Physical, chart, labs and discussed the procedure including the risks, benefits and alternatives for the proposed anesthesia with the patient or authorized representative who has indicated his/her understanding and acceptance.   Dental Advisory Given  Plan Discussed with: Anesthesiologist, CRNA and Surgeon  Anesthesia Plan Comments:         Anesthesia Quick Evaluation

## 2016-10-11 NOTE — Op Note (Signed)
PREOPERATIVE DIAGNOSIS:  Nuclear sclerotic cataract of the right eye.   POSTOPERATIVE DIAGNOSIS:  right nuclear sclerotic CATARACT   OPERATIVE PROCEDURE: Procedure(s): CATARACT EXTRACTION PHACO AND INTRAOCULAR LENS PLACEMENT (IOC)   SURGEON:  Birder Robson, MD.   ANESTHESIA:  Anesthesiologist: Alvin Critchley, MD CRNA: Demetrius Charity, CRNA  1.      Managed anesthesia care. 2.      0.23ml of Shugarcaine was instilled in the eye following the paracentesis.   COMPLICATIONS:  None.   TECHNIQUE:   Stop and chop   DESCRIPTION OF PROCEDURE:  The patient was examined and consented in the preoperative holding area where the aforementioned topical anesthesia was applied to the right eye and then brought back to the Operating Room where the right eye was prepped and draped in the usual sterile ophthalmic fashion and a lid speculum was placed. A paracentesis was created with the side port blade and the anterior chamber was filled with viscoelastic. A near clear corneal incision was performed with the steel keratome. A continuous curvilinear capsulorrhexis was performed with a cystotome followed by the capsulorrhexis forceps. Hydrodissection and hydrodelineation were carried out with BSS on a blunt cannula. The lens was removed in a stop and chop  technique and the remaining cortical material was removed with the irrigation-aspiration handpiece. The capsular bag was inflated with viscoelastic and the Technis ZCB00  lens was placed in the capsular bag without complication. The remaining viscoelastic was removed from the eye with the irrigation-aspiration handpiece. The wounds were hydrated. The anterior chamber was flushed with Miostat and the eye was inflated to physiologic pressure. 0.54ml of Vigamox was placed in the anterior chamber. The wounds were found to be water tight. The eye was dressed with Vigamox. The patient was given protective glasses to wear throughout the day and a shield with which to sleep  tonight. The patient was also given drops with which to begin a drop regimen today and will follow-up with me in one day.  Implant Name Type Inv. Item Serial No. Manufacturer Lot No. LRB No. Used  LENS IOL DIOP 18.5 - [id number] Intraocular Lens LENS IOL DIOP 18.5 [id number] AMO   Right 1   Procedure(s) with comments: CATARACT EXTRACTION PHACO AND INTRAOCULAR LENS PLACEMENT (I[facility]) (Right) - Lot# [id number] H [place]: 00:37.7 AP%: 18.1 CDE: 6.80  Electronically signed: [place] 10/11/2016 11:07 AM

## 2016-10-11 NOTE — Anesthesia Postprocedure Evaluation (Signed)
Anesthesia Post Note  Patient: Bonnie West  Procedure(s) Performed: Procedure(s) (LRB): CATARACT EXTRACTION PHACO AND INTRAOCULAR LENS PLACEMENT (I[facility]) (Right)  Patient location during evaluation: PACU Anesthesia Type: MAC Level of consciousness: awake and alert and oriented Pain management: pain level controlled Vital Signs Assessment: post-procedure vital signs reviewed and stable Respiratory status: spontaneous breathing Cardiovascular status: blood pressure returned to baseline Anesthetic complications: no    Last Vitals:  Vitals:   10/11/16 0955  BP: (!) 137/94  Pulse: 69  Resp: 16  Temp: 36.5 C    Last Pain:  Vitals:   10/11/16 0955  TempSrc: Oral  PainSc: 4                  Blima Singer

## 2016-10-27 DIAGNOSIS — H2512 Age-related nuclear cataract, left eye: Secondary | ICD-10-CM | POA: Diagnosis not present

## 2016-10-31 ENCOUNTER — Encounter: Payer: Self-pay | Admitting: *Deleted

## 2016-11-02 DIAGNOSIS — G43009 Migraine without aura, not intractable, without status migrainosus: Secondary | ICD-10-CM | POA: Diagnosis not present

## 2016-11-02 DIAGNOSIS — G4733 Obstructive sleep apnea (adult) (pediatric): Secondary | ICD-10-CM | POA: Diagnosis not present

## 2016-11-02 DIAGNOSIS — J301 Allergic rhinitis due to pollen: Secondary | ICD-10-CM | POA: Diagnosis not present

## 2016-11-02 DIAGNOSIS — J4540 Moderate persistent asthma, uncomplicated: Secondary | ICD-10-CM | POA: Diagnosis not present

## 2016-11-04 DIAGNOSIS — E039 Hypothyroidism, unspecified: Secondary | ICD-10-CM | POA: Diagnosis not present

## 2016-11-04 DIAGNOSIS — F411 Generalized anxiety disorder: Secondary | ICD-10-CM | POA: Diagnosis not present

## 2016-11-04 DIAGNOSIS — I10 Essential (primary) hypertension: Secondary | ICD-10-CM | POA: Diagnosis not present

## 2016-11-04 DIAGNOSIS — M5137 Other intervertebral disc degeneration, lumbosacral region: Secondary | ICD-10-CM | POA: Diagnosis not present

## 2016-11-04 DIAGNOSIS — G4733 Obstructive sleep apnea (adult) (pediatric): Secondary | ICD-10-CM | POA: Diagnosis not present

## 2016-11-04 DIAGNOSIS — J449 Chronic obstructive pulmonary disease, unspecified: Secondary | ICD-10-CM | POA: Diagnosis not present

## 2016-11-08 ENCOUNTER — Ambulatory Visit: Payer: Medicare Other | Admitting: Certified Registered"

## 2016-11-08 ENCOUNTER — Ambulatory Visit
Admission: RE | Admit: 2016-11-08 | Discharge: 2016-11-08 | Disposition: A | Discharge status: Home / Self Care | Payer: Medicare Other | Source: Ambulatory Visit | Attending: Ophthalmology | Admitting: Ophthalmology

## 2016-11-08 ENCOUNTER — Encounter: Payer: Self-pay | Admitting: Anesthesiology

## 2016-11-08 ENCOUNTER — Encounter
Admission: RE | Disposition: A | Discharge status: Home / Self Care | Payer: Self-pay | Source: Ambulatory Visit | Attending: Ophthalmology

## 2016-11-08 DIAGNOSIS — H269 Unspecified cataract: Secondary | ICD-10-CM | POA: Diagnosis not present

## 2016-11-08 DIAGNOSIS — E785 Hyperlipidemia, unspecified: Secondary | ICD-10-CM | POA: Insufficient documentation

## 2016-11-08 DIAGNOSIS — H2512 Age-related nuclear cataract, left eye: Secondary | ICD-10-CM | POA: Insufficient documentation

## 2016-11-08 DIAGNOSIS — E669 Obesity, unspecified: Secondary | ICD-10-CM | POA: Diagnosis not present

## 2016-11-08 DIAGNOSIS — G4730 Sleep apnea, unspecified: Secondary | ICD-10-CM | POA: Diagnosis not present

## 2016-11-08 DIAGNOSIS — F329 Major depressive disorder, single episode, unspecified: Secondary | ICD-10-CM | POA: Insufficient documentation

## 2016-11-08 DIAGNOSIS — I10 Essential (primary) hypertension: Secondary | ICD-10-CM | POA: Insufficient documentation

## 2016-11-08 DIAGNOSIS — Z6830 Body mass index (BMI) 30.0-30.9, adult: Secondary | ICD-10-CM | POA: Diagnosis not present

## 2016-11-08 DIAGNOSIS — J45909 Unspecified asthma, uncomplicated: Secondary | ICD-10-CM | POA: Diagnosis not present

## 2016-11-08 DIAGNOSIS — K209 Esophagitis, unspecified: Secondary | ICD-10-CM | POA: Diagnosis not present

## 2016-11-08 DIAGNOSIS — R51 Headache: Secondary | ICD-10-CM | POA: Diagnosis not present

## 2016-11-08 DIAGNOSIS — M8580 Other specified disorders of bone density and structure, unspecified site: Secondary | ICD-10-CM | POA: Insufficient documentation

## 2016-11-08 DIAGNOSIS — E039 Hypothyroidism, unspecified: Secondary | ICD-10-CM | POA: Insufficient documentation

## 2016-11-08 HISTORY — DX: Gastro-esophageal reflux disease without esophagitis: K21.9

## 2016-11-08 HISTORY — PX: CATARACT EXTRACTION W/PHACO: SHX586

## 2016-11-08 SURGERY — PHACOEMULSIFICATION, CATARACT, WITH IOL INSERTION
Anesthesia: Monitor Anesthesia Care | Laterality: Left | Wound class: Clean

## 2016-11-08 MED ORDER — EPINEPHRINE PF 1 MG/ML IJ SOLN
INTRAOCULAR | Status: DC | PRN
  Administered 2016-11-08: 250 mL via OPHTHALMIC

## 2016-11-08 MED ORDER — SODIUM CHLORIDE 0.9 % IV SOLN
INTRAVENOUS | Status: DC
  Administered 2016-11-08: 08:00:00 via INTRAVENOUS

## 2016-11-08 MED ORDER — MOXIFLOXACIN HCL 0.5 % OP SOLN
OPHTHALMIC | Status: AC
  Filled 2016-11-08: qty 3

## 2016-11-08 MED ORDER — LIDOCAINE HCL (PF) 4 % IJ SOLN
INTRAMUSCULAR | Status: AC
  Filled 2016-11-08: qty 5

## 2016-11-08 MED ORDER — NA CHONDROIT SULF-NA HYALURON 40-17 MG/ML IO SOLN
INTRAOCULAR | Status: AC
  Filled 2016-11-08: qty 1

## 2016-11-08 MED ORDER — ARMC OPHTHALMIC DILATING DROPS
OPHTHALMIC | Status: AC
  Administered 2016-11-08: 1 via OPHTHALMIC
  Filled 2016-11-08: qty 0.4

## 2016-11-08 MED ORDER — MOXIFLOXACIN HCL 0.5 % OP SOLN
OPHTHALMIC | Status: DC | PRN
  Administered 2016-11-08: 1 [drp] via OPHTHALMIC

## 2016-11-08 MED ORDER — ARMC OPHTHALMIC DILATING DROPS
1.0000 "application " | OPHTHALMIC | Status: AC
  Administered 2016-11-08 (×3): 1 via OPHTHALMIC

## 2016-11-08 MED ORDER — FENTANYL CITRATE (PF) 100 MCG/2ML IJ SOLN
INTRAMUSCULAR | Status: DC | PRN
  Administered 2016-11-08: 50 ug via INTRAVENOUS

## 2016-11-08 MED ORDER — MIDAZOLAM HCL 2 MG/2ML IJ SOLN
INTRAMUSCULAR | Status: DC | PRN
  Administered 2016-11-08: 1 mg via INTRAVENOUS

## 2016-11-08 MED ORDER — MOXIFLOXACIN HCL 0.5 % OP SOLN
1.0000 [drp] | OPHTHALMIC | Status: DC | PRN
  Administered 2016-11-08 (×3): 1 [drp] via OPHTHALMIC

## 2016-11-08 MED ORDER — POVIDONE-IODINE 5 % OP SOLN
OPHTHALMIC | Status: AC
  Filled 2016-11-08: qty 30

## 2016-11-08 MED ORDER — CARBACHOL 0.01 % IO SOLN
INTRAOCULAR | Status: DC | PRN
  Administered 2016-11-08: 0.5 mL via INTRAOCULAR

## 2016-11-08 MED ORDER — EPINEPHRINE PF 1 MG/ML IJ SOLN
INTRAMUSCULAR | Status: AC
  Filled 2016-11-08: qty 1

## 2016-11-08 MED ORDER — NA CHONDROIT SULF-NA HYALURON 40-17 MG/ML IO SOLN
INTRAOCULAR | Status: DC | PRN
  Administered 2016-11-08: 1 mL via INTRAOCULAR

## 2016-11-08 MED ORDER — BSS IO SOLN
INTRAOCULAR | Status: DC | PRN
  Administered 2016-11-08: 1 mL via OPHTHALMIC

## 2016-11-08 SURGICAL SUPPLY — 21 items
CANNULA ANT/CHMB 27GA (MISCELLANEOUS) ×2 IMPLANT
CUP MEDICINE 2OZ PLAST GRAD ST (MISCELLANEOUS) ×2 IMPLANT
GLOVE BIO SURGEON STRL SZ8 (GLOVE) ×2 IMPLANT
GLOVE BIOGEL M 6.5 STRL (GLOVE) ×2 IMPLANT
GLOVE SURG LX 8.0 MICRO (GLOVE) ×1
GLOVE SURG LX STRL 8.0 MICRO (GLOVE) ×1 IMPLANT
GOWN STRL REUS W/ TWL LRG LVL3 (GOWN DISPOSABLE) ×2 IMPLANT
GOWN STRL REUS W/TWL LRG LVL3 (GOWN DISPOSABLE) ×2
LENS IOL TECNIS ITEC 22.0 (Intraocular Lens) ×2 IMPLANT
PACK CATARACT (MISCELLANEOUS) ×2 IMPLANT
PACK CATARACT BRASINGTON LX (MISCELLANEOUS) ×2 IMPLANT
PACK EYE AFTER SURG (MISCELLANEOUS) ×2 IMPLANT
SOL BSS BAG (MISCELLANEOUS) ×2
SOL PREP PVP 2OZ (MISCELLANEOUS) ×2
SOLUTION BSS BAG (MISCELLANEOUS) ×1 IMPLANT
SOLUTION PREP PVP 2OZ (MISCELLANEOUS) ×1 IMPLANT
SYR 3ML LL SCALE MARK (SYRINGE) ×2 IMPLANT
SYR 5ML LL (SYRINGE) ×2 IMPLANT
SYR TB 1ML 27GX1/2 LL (SYRINGE) ×2 IMPLANT
WATER STERILE IRR 250ML POUR (IV SOLUTION) ×2 IMPLANT
WIPE NON LINTING 3.25X3.25 (MISCELLANEOUS) ×2 IMPLANT

## 2016-11-08 NOTE — Discharge Instructions (Signed)
Eye Surgery Discharge Instructions  Expect mild scratchy sensation or mild soreness. DO NOT RUB YOUR EYE!  The day of surgery:  Minimal physical activity, but bed rest is not required  No reading, computer work, or close hand work  No bending, lifting, or straining.  May watch TV  For 24 hours:  No driving, legal decisions, or alcoholic beverages  Safety precautions  Eat anything you prefer: It is better to start with liquids, then soup then solid foods.  _____ Eye patch should be worn until postoperative exam tomorrow.  ____ Solar shield eyeglasses should be worn for comfort in the sunlight/patch while sleeping  Resume all regular medications including aspirin or Coumadin if these were discontinued prior to surgery. You may shower, bathe, shave, or wash your hair. Tylenol may be taken for mild discomfort.  Call your doctor if you experience significant pain, nausea, or vomiting, fever > 101 or other signs of infection. [phone] or [phone] Specific instructions:  Follow-up Information    PORFILIO,WILLIAM LOUIS, MD Follow up.   Specialty:  Ophthalmology Why:  December 20 at 10:50am Contact information: [address] [place] [address] [phone]

## 2016-11-08 NOTE — H&P (Signed)
All labs reviewed. Abnormal studies sent to patients PCP when indicated.  Previous H&P reviewed, patient examined, there are NO CHANGES.  Bonnie Kimberlin LOUIS12/19/20178:17 AM

## 2016-11-08 NOTE — Anesthesia Postprocedure Evaluation (Signed)
Anesthesia Post Note  Patient: Bonnie West  Procedure(s) Performed: Procedure(s) (LRB): CATARACT EXTRACTION PHACO AND INTRAOCULAR LENS PLACEMENT (I[facility]) (Left)  Patient location during evaluation: PACU Anesthesia Type: MAC Level of consciousness: awake and alert Pain management: pain level controlled Vital Signs Assessment: post-procedure vital signs reviewed and stable Respiratory status: spontaneous breathing, nonlabored ventilation, respiratory function stable and patient connected to nasal cannula oxygen Cardiovascular status: stable and blood pressure returned to baseline Anesthetic complications: no     Last Vitals:  Vitals:   11/08/16 0714 11/08/16 0844  BP: (!) 158/83 (!) 150/82  Pulse: 68 75  Resp: 16 18  Temp: 36.7 C 36.7 C    Last Pain:  Vitals:   11/08/16 0714  TempSrc: Oral  PainSc: 5                  Brantley Fling

## 2016-11-08 NOTE — Transfer of Care (Signed)
Immediate Anesthesia Transfer of Care Note  Patient: Bonnie West  Procedure(s) Performed: Procedure(s) with comments: CATARACT EXTRACTION PHACO AND INTRAOCULAR LENS PLACEMENT ([place]) (Left) - PACK LOT: [id number] H US:00:32 AP:44 CDE:6.46  Patient Location: PACU and Short Stay  Anesthesia Type:MAC  Level of Consciousness: awake  Airway & Oxygen Therapy: Patient Spontanous Breathing  Post-op Assessment: Report given to RN  Post vital signs: Reviewed  Last Vitals:  Vitals:   11/08/16 0714 11/08/16 0844  BP: (!) 158/83 (!) 150/82  Pulse: 68 75  Resp: 16 18  Temp: 36.7 C 36.7 C    Last Pain:  Vitals:   11/08/16 0714  TempSrc: Oral  PainSc: 5          Complications: No apparent anesthesia complications

## 2016-11-08 NOTE — Op Note (Signed)
PREOPERATIVE DIAGNOSIS:  Nuclear sclerotic cataract of the left eye.   POSTOPERATIVE DIAGNOSIS:  Nuclear sclerotic cataract of the left eye.   OPERATIVE PROCEDURE: Procedure(s): CATARACT EXTRACTION PHACO AND INTRAOCULAR LENS PLACEMENT (IOC)   SURGEON:  Birder Robson, MD.   ANESTHESIA:  Anesthesiologist: Andria Frames, MD CRNA: Rolla Plate, CRNA  1.      Managed anesthesia care. 2.     0.37ml of Shugarcaine was instilled following the paracentesis   COMPLICATIONS:  None.   TECHNIQUE:   Stop and chop   DESCRIPTION OF PROCEDURE:  The patient was examined and consented in the preoperative holding area where the aforementioned topical anesthesia was applied to the left eye and then brought back to the Operating Room where the left eye was prepped and draped in the usual sterile ophthalmic fashion and a lid speculum was placed. A paracentesis was created with the side port blade and the anterior chamber was filled with viscoelastic. A near clear corneal incision was performed with the steel keratome. A continuous curvilinear capsulorrhexis was performed with a cystotome followed by the capsulorrhexis forceps. Hydrodissection and hydrodelineation were carried out with BSS on a blunt cannula. The lens was removed in a stop and chop  technique and the remaining cortical material was removed with the irrigation-aspiration handpiece. The capsular bag was inflated with viscoelastic and the Technis ZCB00 lens was placed in the capsular bag without complication. The remaining viscoelastic was removed from the eye with the irrigation-aspiration handpiece. The wounds were hydrated. The anterior chamber was flushed with Miostat and the eye was inflated to physiologic pressure. 0.13ml Vigamox was placed in the anterior chamber. The wounds were found to be water tight. The eye was dressed with Vigamox. The patient was given protective glasses to wear throughout the day and a shield with which to sleep  tonight. The patient was also given drops with which to begin a drop regimen today and will follow-up with me in one day.  Implant Name Type Inv. Item Serial No. Manufacturer Lot No. LRB No. Used  LENS IOL DIOP 22.0 - [id number] Intraocular Lens LENS IOL DIOP 22.0 [id number] AMO   Left 1    Procedure(s) with comments: CATARACT EXTRACTION PHACO AND INTRAOCULAR LENS PLACEMENT (IOC) (Left) - PACK LOT: [id number] H US:00:32 AP:44 CDE:6.46  Electronically signed: [place] 11/08/2016 8:42 AM

## 2016-11-08 NOTE — Anesthesia Preprocedure Evaluation (Signed)
Anesthesia Evaluation  Patient identified by MRN, date of birth, ID band Patient awake    Reviewed: Allergy & Precautions, H&P , NPO status , Patient's Chart, lab work & pertinent test results, reviewed documented beta blocker date and time   History of Anesthesia Complications Negative for: history of anesthetic complications  Airway Mallampati: I  TM Distance: >3 FB Neck ROM: full    Dental no notable dental hx. (+) Caps, Teeth Intact Permanent bridge:   Pulmonary neg shortness of breath, asthma , sleep apnea and Continuous Positive Airway Pressure Ventilation , neg COPD, neg recent URI, former smoker,    Pulmonary exam normal breath sounds clear to auscultation       Cardiovascular Exercise Tolerance: Good hypertension, (-) angina(-) CAD, (-) Past MI, (-) Cardiac Stents and (-) CABG Normal cardiovascular exam(-) dysrhythmias + Valvular Problems/Murmurs  Rhythm:regular Rate:Normal     Neuro/Psych PSYCHIATRIC DISORDERS (Depression) negative neurological ROS     GI/Hepatic Neg liver ROS, GERD  Medicated and Controlled,  Endo/Other  neg diabetesHypothyroidism   Renal/GU CRFRenal disease  negative genitourinary   Musculoskeletal   Abdominal   Peds  Hematology negative hematology ROS (+)   Anesthesia Other Findings Past Medical History:   Asthma                                                       Hypertension                                                 Abdominal pain                                               Allergic rhinitis                                            Depression                                                   Hyperlipemia                                                 Headache                                                     Obesity  Osteopenia                                                   Pneumothorax                                                  Esophagitis                                                  Sleep apnea                                                  Hypothyroidism                                               Reproductive/Obstetrics negative OB ROS                             Anesthesia Physical  Anesthesia Plan  ASA: III  Anesthesia Plan: MAC   Post-op Pain Management:    Induction: Intravenous  Airway Management Planned: Nasal Cannula  Additional Equipment:   Intra-op Plan:   Post-operative Plan:   Informed Consent: I have reviewed the patients History and Physical, chart, labs and discussed the procedure including the risks, benefits and alternatives for the proposed anesthesia with the patient or authorized representative who has indicated his/her understanding and acceptance.   Dental Advisory Given  Plan Discussed with: Anesthesiologist, CRNA and Surgeon  Anesthesia Plan Comments:         Anesthesia Quick Evaluation

## 2016-11-09 DIAGNOSIS — H2512 Age-related nuclear cataract, left eye: Secondary | ICD-10-CM | POA: Diagnosis not present

## 2016-12-19 ENCOUNTER — Other Ambulatory Visit: Payer: Self-pay | Admitting: Internal Medicine

## 2016-12-19 DIAGNOSIS — G43009 Migraine without aura, not intractable, without status migrainosus: Secondary | ICD-10-CM | POA: Diagnosis not present

## 2016-12-19 DIAGNOSIS — Z23 Encounter for immunization: Secondary | ICD-10-CM | POA: Diagnosis not present

## 2016-12-19 DIAGNOSIS — E039 Hypothyroidism, unspecified: Secondary | ICD-10-CM | POA: Diagnosis not present

## 2016-12-19 DIAGNOSIS — G4700 Insomnia, unspecified: Secondary | ICD-10-CM | POA: Diagnosis not present

## 2016-12-19 DIAGNOSIS — J4540 Moderate persistent asthma, uncomplicated: Secondary | ICD-10-CM | POA: Diagnosis not present

## 2016-12-19 DIAGNOSIS — M5137 Other intervertebral disc degeneration, lumbosacral region: Secondary | ICD-10-CM | POA: Diagnosis not present

## 2016-12-19 DIAGNOSIS — Z1231 Encounter for screening mammogram for malignant neoplasm of breast: Secondary | ICD-10-CM

## 2016-12-19 DIAGNOSIS — F411 Generalized anxiety disorder: Secondary | ICD-10-CM | POA: Diagnosis not present

## 2017-01-24 ENCOUNTER — Ambulatory Visit
Admission: RE | Admit: 2017-01-24 | Discharge: 2017-01-24 | Disposition: A | Discharge status: Home / Self Care | Payer: Medicare Other | Source: Ambulatory Visit | Attending: Internal Medicine | Admitting: Internal Medicine

## 2017-01-24 DIAGNOSIS — Z1231 Encounter for screening mammogram for malignant neoplasm of breast: Secondary | ICD-10-CM | POA: Diagnosis not present

## 2017-02-13 DIAGNOSIS — N189 Chronic kidney disease, unspecified: Secondary | ICD-10-CM | POA: Diagnosis not present

## 2017-02-13 DIAGNOSIS — I10 Essential (primary) hypertension: Secondary | ICD-10-CM | POA: Diagnosis not present

## 2017-02-13 DIAGNOSIS — E782 Mixed hyperlipidemia: Secondary | ICD-10-CM | POA: Diagnosis not present

## 2017-02-16 DIAGNOSIS — E782 Mixed hyperlipidemia: Secondary | ICD-10-CM | POA: Diagnosis not present

## 2017-02-16 DIAGNOSIS — Z0001 Encounter for general adult medical examination with abnormal findings: Secondary | ICD-10-CM | POA: Diagnosis not present

## 2017-02-16 DIAGNOSIS — Z79891 Long term (current) use of opiate analgesic: Secondary | ICD-10-CM | POA: Diagnosis not present

## 2017-02-16 DIAGNOSIS — M5137 Other intervertebral disc degeneration, lumbosacral region: Secondary | ICD-10-CM | POA: Diagnosis not present

## 2017-02-16 DIAGNOSIS — Z124 Encounter for screening for malignant neoplasm of cervix: Secondary | ICD-10-CM | POA: Diagnosis not present

## 2017-02-16 DIAGNOSIS — N390 Urinary tract infection, site not specified: Secondary | ICD-10-CM | POA: Diagnosis not present

## 2017-02-16 DIAGNOSIS — E2839 Other primary ovarian failure: Secondary | ICD-10-CM | POA: Diagnosis not present

## 2017-02-16 DIAGNOSIS — I10 Essential (primary) hypertension: Secondary | ICD-10-CM | POA: Diagnosis not present

## 2017-02-16 DIAGNOSIS — I349 Nonrheumatic mitral valve disorder, unspecified: Secondary | ICD-10-CM | POA: Diagnosis not present

## 2017-02-16 DIAGNOSIS — F411 Generalized anxiety disorder: Secondary | ICD-10-CM | POA: Diagnosis not present

## 2017-03-01 DIAGNOSIS — Z780 Asymptomatic menopausal state: Secondary | ICD-10-CM | POA: Diagnosis not present

## 2017-03-01 DIAGNOSIS — M85852 Other specified disorders of bone density and structure, left thigh: Secondary | ICD-10-CM | POA: Diagnosis not present

## 2017-03-01 DIAGNOSIS — M8588 Other specified disorders of bone density and structure, other site: Secondary | ICD-10-CM | POA: Diagnosis not present

## 2017-03-01 DIAGNOSIS — H53123 Transient visual loss, bilateral: Secondary | ICD-10-CM | POA: Diagnosis not present

## 2017-03-08 DIAGNOSIS — I349 Nonrheumatic mitral valve disorder, unspecified: Secondary | ICD-10-CM | POA: Diagnosis not present

## 2017-03-14 DIAGNOSIS — J301 Allergic rhinitis due to pollen: Secondary | ICD-10-CM | POA: Diagnosis not present

## 2017-03-14 DIAGNOSIS — J4540 Moderate persistent asthma, uncomplicated: Secondary | ICD-10-CM | POA: Diagnosis not present

## 2017-03-14 DIAGNOSIS — G43009 Migraine without aura, not intractable, without status migrainosus: Secondary | ICD-10-CM | POA: Diagnosis not present

## 2017-03-14 DIAGNOSIS — G4733 Obstructive sleep apnea (adult) (pediatric): Secondary | ICD-10-CM | POA: Diagnosis not present

## 2017-05-01 ENCOUNTER — Ambulatory Visit
Admission: EM | Admit: 2017-05-01 | Discharge: 2017-05-01 | Disposition: A | Discharge status: Home / Self Care | Payer: Medicare Other | Attending: Family Medicine | Admitting: Family Medicine

## 2017-05-01 DIAGNOSIS — M8580 Other specified disorders of bone density and structure, unspecified site: Secondary | ICD-10-CM | POA: Insufficient documentation

## 2017-05-01 DIAGNOSIS — Z9889 Other specified postprocedural states: Secondary | ICD-10-CM | POA: Insufficient documentation

## 2017-05-01 DIAGNOSIS — E039 Hypothyroidism, unspecified: Secondary | ICD-10-CM | POA: Diagnosis not present

## 2017-05-01 DIAGNOSIS — Z8052 Family history of malignant neoplasm of bladder: Secondary | ICD-10-CM | POA: Insufficient documentation

## 2017-05-01 DIAGNOSIS — Z888 Allergy status to other drugs, medicaments and biological substances status: Secondary | ICD-10-CM | POA: Insufficient documentation

## 2017-05-01 DIAGNOSIS — Z79899 Other long term (current) drug therapy: Secondary | ICD-10-CM | POA: Diagnosis not present

## 2017-05-01 DIAGNOSIS — G4730 Sleep apnea, unspecified: Secondary | ICD-10-CM | POA: Insufficient documentation

## 2017-05-01 DIAGNOSIS — F329 Major depressive disorder, single episode, unspecified: Secondary | ICD-10-CM | POA: Diagnosis not present

## 2017-05-01 DIAGNOSIS — Z803 Family history of malignant neoplasm of breast: Secondary | ICD-10-CM | POA: Insufficient documentation

## 2017-05-01 DIAGNOSIS — E669 Obesity, unspecified: Secondary | ICD-10-CM | POA: Diagnosis not present

## 2017-05-01 DIAGNOSIS — Z8042 Family history of malignant neoplasm of prostate: Secondary | ICD-10-CM | POA: Insufficient documentation

## 2017-05-01 DIAGNOSIS — K219 Gastro-esophageal reflux disease without esophagitis: Secondary | ICD-10-CM | POA: Insufficient documentation

## 2017-05-01 DIAGNOSIS — I10 Essential (primary) hypertension: Secondary | ICD-10-CM | POA: Insufficient documentation

## 2017-05-01 DIAGNOSIS — J45909 Unspecified asthma, uncomplicated: Secondary | ICD-10-CM | POA: Diagnosis not present

## 2017-05-01 DIAGNOSIS — Z87891 Personal history of nicotine dependence: Secondary | ICD-10-CM | POA: Insufficient documentation

## 2017-05-01 DIAGNOSIS — Z800 Family history of malignant neoplasm of digestive organs: Secondary | ICD-10-CM | POA: Diagnosis not present

## 2017-05-01 DIAGNOSIS — E785 Hyperlipidemia, unspecified: Secondary | ICD-10-CM | POA: Diagnosis not present

## 2017-05-01 LAB — BASIC METABOLIC PANEL
Anion gap: 9 (ref 5–15)
BUN: 17 mg/dL (ref 6–20)
CO2: 25 mmol/L (ref 22–32)
Calcium: 9.6 mg/dL (ref 8.9–10.3)
Chloride: 101 mmol/L (ref 101–111)
Creatinine, Ser: 1.04 mg/dL — ABNORMAL HIGH (ref 0.44–1.00)
GFR calc Af Amer: >60 mL/min (ref >60)
GFR, EST NON AFRICAN AMERICAN: 54 mL/min — AB (ref >60)
GLUCOSE: 119 mg/dL — AB (ref 65–99)
POTASSIUM: 3.4 mmol/L — AB (ref 3.5–5.1)
Sodium: 135 mmol/L (ref 135–145)

## 2017-05-01 MED ORDER — LISINOPRIL 20 MG PO TABS: 20.0000 mg | ORAL_TABLET | Freq: Every day | ORAL | 0 refills | Status: DC

## 2017-05-01 NOTE — ED Provider Notes (Signed)
[facility]    CSN: [id number] Arrival date & time: 05/01/17  1551     History   Chief Complaint Chief Complaint  Patient presents with  . Hypertension    HPI Bonnie West is a 66 y.o. female.   66 yo female with a c/o high blood pressure readings at home for the past week. States she called her PCP and was instructed to come here as they didn't have any appointments today. Denies any chest pressure, shortness of breath, numbness/tingling, one-sided weakness.    The history is provided by the patient.  Hypertension     Past Medical History:  Diagnosis Date  . Allergic rhinitis   . Asthma   . Depression   . Dyspnea   . Esophagitis   . GERD (gastroesophageal reflux disease)   . Headache   . Heart murmur   . Hyperlipemia   . Hypertension   . Hypothyroidism   . Obesity   . Osteopenia   . Palpitations   . Pneumothorax   . Sleep apnea     There are no active problems to display for this patient.   Past Surgical History:  Procedure Laterality Date  . BUNIONECTOMY    . CATARACT EXTRACTION W/PHACO Right 10/11/2016   Procedure: CATARACT EXTRACTION PHACO AND INTRAOCULAR LENS PLACEMENT (IOC);  Surgeon: Birder Robson, MD;  Location: [facility] ORS;  Service: Ophthalmology;  Laterality: Right;  Lot# [id number] H [place]: 00:37.7 AP%: 18.1 CDE: 6.80  . CATARACT EXTRACTION W/PHACO Left 11/08/2016   Procedure: CATARACT EXTRACTION PHACO AND INTRAOCULAR LENS PLACEMENT (IOC);  Surgeon: Birder Robson, MD;  Location: [facility] ORS;  Service: Ophthalmology;  Laterality: Left;  PACK LOT: [id number] H US:00:32 AP:44 CDE:6.46  . COLONOSCOPY    . COLONOSCOPY WITH PROPOFOL N/A 11/26/2015   Procedure: COLONOSCOPY WITH PROPOFOL;  Surgeon: Manya Silvas, MD;  Location: [facility] ENDOSCOPY;  Service: Endoscopy;  Laterality: N/A;  . EYE SURGERY    . TONSILLECTOMY      OB History    No data available       Home Medications    Prior to Admission medications   Medication Sig Start  Date End Date Taking? Authorizing Provider  albuterol (PROVENTIL HFA;VENTOLIN HFA) 108 (90 Base) MCG/ACT inhaler Inhale 2 puffs into the lungs every 6 (six) hours as needed for wheezing or shortness of breath.   Yes [provider]  ALPRAZolam (XANAX) 0.25 MG tablet Take 0.25 mg by mouth 2 (two) times daily as needed. 08/27/16  Yes [provider]  bisacodyl (DULCOLAX) 10 MG suppository Place 10 mg rectally as needed for moderate constipation.   Yes [provider]  cetirizine (ZYRTEC) 10 MG tablet Take 10 mg by mouth daily.   Yes [provider]  clindamycin (CLEOCIN T) 1 % external solution Apply 1 application topically 2 (two) times daily.   Yes [provider]  Cyanocobalamin (VITAMIN B12 PO) Take 1 tablet by mouth daily.   Yes [provider]  cyclobenzaprine (FLEXERIL) 5 MG tablet Take 5 mg by mouth 3 (three) times daily as needed for muscle spasms.   Yes [provider]  docusate sodium (COLACE) 100 MG capsule Take 100 mg by mouth 2 (two) times daily.   Yes [provider]  fenofibrate 160 MG tablet Take 160 mg by mouth daily.   Yes [provider]  FLUoxetine (PROZAC) 40 MG capsule Take 40 mg by mouth daily.   Yes [provider]  fluticasone (FLOVENT HFA) 110 MCG/ACT inhaler  Inhale 1 puff into the lungs 2 (two) times daily.   Yes [provider]  ipratropium (ATROVENT HFA) 17 MCG/ACT inhaler Inhale 2 puffs into the lungs every 6 (six) hours.   Yes [provider]  isometheptene-acetaminophen-dichloralphenazone (MIDRIN) 65-100-325 MG capsule Take 1 capsule by mouth 4 (four) times daily as needed for migraine. Maximum 5 capsules in 12 hours for migraine headaches, 8 capsules in 24 hours for tension headaches.   Yes [provider]  levothyroxine (SYNTHROID, LEVOTHROID) 100 MCG tablet Take 100 mcg by mouth every morning. 08/04/16  Yes [provider]  magnesium oxide  (MAG-OX) 400 MG tablet Take 400 mg by mouth daily.   Yes [provider]  montelukast (SINGULAIR) 10 MG tablet Take 10 mg by mouth at bedtime.   Yes [provider]  omeprazole (PRILOSEC) 40 MG capsule Take 40 mg by mouth daily.   Yes [provider]  VICODIN 5-300 MG TABS Take 1 tablet by mouth 2 (two) times daily as needed. 08/16/16  Yes [provider]  azelastine (ASTELIN) 0.1 % nasal spray Place 2 sprays into both nostrils 2 (two) times daily as needed for allergies. Use in each nostril as directed     [provider]  lisinopril (PRINIVIL,ZESTRIL) 20 MG tablet Take 1 tablet (20 mg total) by mouth daily. 05/01/17   Norval Gable, MD  triamcinolone (NASACORT ALLERGY 24HR) 55 MCG/ACT AERO nasal inhaler Place 2 sprays into the nose daily.    [provider]  triamcinolone cream (KENALOG) 0.1 % Apply 1 application topically 2 (two) times daily as needed.     [provider]    Family History Family History  Problem Relation Age of Onset  . Breast cancer Maternal Grandmother   . Bladder Cancer Father   . Prostate cancer Father   . Colon cancer Paternal Grandmother     Social History Social History  Substance Use Topics  . Smoking status: Former Research scientist (life sciences)  . Smokeless tobacco: Never Used  . Alcohol use No     Allergies   Advair diskus [fluticasone-salmeterol]; Codeine; Erythromycin; Morphine and related; Nsaids; and Sulfa antibiotics   Review of Systems Review of Systems   Physical Exam Triage Vital Signs ED Triage Vitals  Enc Vitals Group     BP 05/01/17 1607 (!) 156/83     Pulse Rate 05/01/17 1607 79     Resp 05/01/17 1607 18     Temp 05/01/17 1607 98.3 F (36.8 C)     Temp Source 05/01/17 1607 Oral     SpO2 05/01/17 1607 98 %     Weight 05/01/17 1604 161 lb (73 kg)     Height 05/01/17 1604 5\' 1"  (1.549 m)     Head Circumference --      Peak Flow --      Pain Score 05/01/17 1604 3     Pain Loc --       Pain Edu? --      Excl. in [place]? --    No data found.   Updated Vital Signs BP (!) 155/84 (BP Location: Left Arm) Comment: Adult Cuff Blue  Pulse 79   Temp 98.3 F (36.8 C) (Oral)   Resp 18   Ht 5\' 1"  (1.549 m)   Wt 161 lb (73 kg)   SpO2 98%   BMI 30.42 kg/m   Visual Acuity Right Eye Distance:   Left Eye Distance:   Bilateral Distance:    Right Eye Near:  Left Eye Near:    Bilateral Near:     Physical Exam  Constitutional: She is oriented to person, place, and time. She appears well-developed and well-nourished. No distress.  HENT:  Head: Normocephalic and atraumatic.  Right Ear: Tympanic membrane, external ear and ear canal normal.  Left Ear: Tympanic membrane, external ear and ear canal normal.  Nose: Mucosal edema present. No rhinorrhea, nose lacerations, sinus tenderness, nasal deformity, septal deviation or nasal septal hematoma. No epistaxis.  No foreign bodies. Right sinus exhibits no maxillary sinus tenderness and no frontal sinus tenderness. Left sinus exhibits no maxillary sinus tenderness and no frontal sinus tenderness.  Mouth/Throat: Uvula is midline, oropharynx is clear and moist and mucous membranes are normal. No oropharyngeal exudate.  Eyes: Conjunctivae and EOM are normal. Pupils are equal, round, and reactive to light. Right eye exhibits no discharge. Left eye exhibits no discharge. No scleral icterus.  Neck: Normal range of motion. Neck supple. No thyromegaly present.  Cardiovascular: Normal rate, regular rhythm, normal heart sounds and intact distal pulses.   Pulmonary/Chest: Effort normal and breath sounds normal. No respiratory distress. She has no wheezes. She has no rales.  Lymphadenopathy:    She has no cervical adenopathy.  Neurological: She is alert and oriented to person, place, and time. No cranial nerve deficit.  Skin: She is not diaphoretic.  Nursing note and vitals reviewed.    UC Treatments / Results  Labs (all labs ordered are listed,  but only abnormal results are displayed) Labs Reviewed  BASIC METABOLIC PANEL - Abnormal; Notable for the following:       Result Value   Potassium 3.4 (*)    Glucose, Bld 119 (*)    Creatinine, Ser 1.04 (*)    GFR calc non Af Amer 54 (*)    All other components within normal limits    EKG  EKG Interpretation None       Radiology No results found.  Procedures Procedures (including critical care time)  Medications Ordered in UC Medications - No data to display   Initial Impression / Assessment and Plan / UC Course  I have reviewed the triage vital signs and the nursing notes.  Pertinent labs & imaging results that were available during my care of the patient were reviewed by me and considered in my medical decision making (see chart for details).       Final Clinical Impressions(s) / UC Diagnoses   Final diagnoses:  Uncontrolled hypertension    New Prescriptions Discharge Medication List as of 05/01/2017  5:41 PM     1. Lab results and diagnosis reviewed with patient 2. Recommend increase lisinopril to 20mg  po qd; info on DASH diet given  3. Follow-up with PCP for further chronic hypertension management and recheck labs    Norval Gable, MD 05/01/17 [phone]

## 2017-05-01 NOTE — ED Triage Notes (Signed)
Pt is here because she has an elevated B/P for the last week. She does take Lisinopril however it doesn't seem to be managing her pressure at this time. Headaches, denies vision changes.

## 2017-05-16 DIAGNOSIS — F411 Generalized anxiety disorder: Secondary | ICD-10-CM | POA: Diagnosis not present

## 2017-05-16 DIAGNOSIS — G43009 Migraine without aura, not intractable, without status migrainosus: Secondary | ICD-10-CM | POA: Diagnosis not present

## 2017-05-16 DIAGNOSIS — I10 Essential (primary) hypertension: Secondary | ICD-10-CM | POA: Diagnosis not present

## 2017-05-16 DIAGNOSIS — J449 Chronic obstructive pulmonary disease, unspecified: Secondary | ICD-10-CM | POA: Diagnosis not present

## 2017-05-16 DIAGNOSIS — N814 Uterovaginal prolapse, unspecified: Secondary | ICD-10-CM | POA: Diagnosis not present

## 2017-05-16 DIAGNOSIS — M545 Low back pain: Secondary | ICD-10-CM | POA: Diagnosis not present

## 2017-06-08 DIAGNOSIS — R945 Abnormal results of liver function studies: Secondary | ICD-10-CM | POA: Diagnosis not present

## 2017-06-08 DIAGNOSIS — J4541 Moderate persistent asthma with (acute) exacerbation: Secondary | ICD-10-CM | POA: Diagnosis not present

## 2017-06-08 DIAGNOSIS — J449 Chronic obstructive pulmonary disease, unspecified: Secondary | ICD-10-CM | POA: Diagnosis not present

## 2017-06-08 DIAGNOSIS — I10 Essential (primary) hypertension: Secondary | ICD-10-CM | POA: Diagnosis not present

## 2017-06-08 DIAGNOSIS — J069 Acute upper respiratory infection, unspecified: Secondary | ICD-10-CM | POA: Diagnosis not present

## 2017-06-12 ENCOUNTER — Encounter: Payer: Self-pay | Admitting: Obstetrics and Gynecology

## 2017-06-12 ENCOUNTER — Ambulatory Visit (INDEPENDENT_AMBULATORY_CARE_PROVIDER_SITE_OTHER): Payer: Medicare Other | Admitting: Obstetrics and Gynecology

## 2017-06-12 VITALS — BP 128/70 | HR 84 | Resp 18 | Ht 60.25 in | Wt 161.0 lb

## 2017-06-12 DIAGNOSIS — M545 Low back pain: Secondary | ICD-10-CM | POA: Diagnosis not present

## 2017-06-12 DIAGNOSIS — N393 Stress incontinence (female) (male): Secondary | ICD-10-CM | POA: Diagnosis not present

## 2017-06-12 DIAGNOSIS — G8929 Other chronic pain: Secondary | ICD-10-CM

## 2017-06-12 DIAGNOSIS — N814 Uterovaginal prolapse, unspecified: Secondary | ICD-10-CM

## 2017-06-12 DIAGNOSIS — K5900 Constipation, unspecified: Secondary | ICD-10-CM

## 2017-06-12 DIAGNOSIS — N3281 Overactive bladder: Secondary | ICD-10-CM | POA: Diagnosis not present

## 2017-06-12 DIAGNOSIS — N8111 Cystocele, midline: Secondary | ICD-10-CM | POA: Diagnosis not present

## 2017-06-12 NOTE — Progress Notes (Addendum)
66 y.o. G1P1001 MarriedCaucasianF here for referred by PCP for vaginal prolapse.   She c/o a 3 year h/o worsening genital prolapse. She pushes the prolapse back up intermittently. It's not painful. She takes a medication (either oral ducolax or colace) daily to have a BM. She has used this bowel regimen for a couple of years, needs more medication this year. Stools vary from soft to hard. She can at times go 5 days without a BM or have 3 in a day.  She has some urinary urgency, sometimes leaks on the way to the bathroom (weekly). She has some GSI with a full bladder (can help to kegel). Urge is worse than stress.  She feels she is emptying her bladder, sometimes takes a while, but she reports a normal stream (large amount). Nocturia 0-1 x a night. Not sexually active secondary to husbands medical issues. No vaginal bleeding. She does have some lower back pain. She has some LLQ abdominal pain, negative CT.     No LMP recorded. Patient is postmenopausal.          Sexually active: No.  The current method of family planning is post menopausal status.    Exercising: No.  The patient does not participate in regular exercise at present. Smoker:  Former smoker  Health Maintenance: Pap:  2018 with PCP WNL per patient  History of abnormal Pap:  Yes- years ago - repeat PAP Normal per patient  MMG:  01-24-17 WNL  Colonoscopy:  2017 polyps BMD:   2018 Osteopenia per patient  TDaP:  2016 Gardasil: N/A   reports that she has quit smoking. She has never used smokeless tobacco. She reports that she does not drink alcohol or use drugs.  Past Medical History:  Diagnosis Date  . Allergic rhinitis   . Asthma   . Depression   . Dyspnea   . Esophagitis   . GERD (gastroesophageal reflux disease)   . Headache   . Heart murmur   . Hyperlipemia   . Hypertension   . Hypothyroidism   . Obesity   . Osteopenia   . Palpitations   . Pneumothorax   . Sleep apnea     Past Surgical History:  Procedure  Laterality Date  . BUNIONECTOMY    . CATARACT EXTRACTION W/PHACO Right 10/11/2016   Procedure: CATARACT EXTRACTION PHACO AND INTRAOCULAR LENS PLACEMENT (IOC);  Surgeon: Birder Robson, MD;  Location: [facility] ORS;  Service: Ophthalmology;  Laterality: Right;  Lot# [id number] H [place]: 00:37.7 AP%: 18.1 CDE: 6.80  . CATARACT EXTRACTION W/PHACO Left 11/08/2016   Procedure: CATARACT EXTRACTION PHACO AND INTRAOCULAR LENS PLACEMENT (IOC);  Surgeon: Birder Robson, MD;  Location: [facility] ORS;  Service: Ophthalmology;  Laterality: Left;  PACK LOT: [id number] H US:00:32 AP:44 CDE:6.46  . COLONOSCOPY    . COLONOSCOPY WITH PROPOFOL N/A 11/26/2015   Procedure: COLONOSCOPY WITH PROPOFOL;  Surgeon: Manya Silvas, MD;  Location: [facility] ENDOSCOPY;  Service: Endoscopy;  Laterality: N/A;  . EYE SURGERY    . TONSILLECTOMY      Current Outpatient Prescriptions  Medication Sig Dispense Refill  . albuterol (PROVENTIL HFA;VENTOLIN HFA) 108 (90 Base) MCG/ACT inhaler Inhale 2 puffs into the lungs every 6 (six) hours as needed for wheezing or shortness of breath.    . ALPRAZolam (XANAX) 0.25 MG tablet Take 0.25 mg by mouth 2 (two) times daily as needed.    Marland Kitchen amLODipine (NORVASC) 5 MG tablet     . bisacodyl (DULCOLAX) 10 MG suppository Place 10 mg rectally as needed  for moderate constipation.    . cetirizine (ZYRTEC) 10 MG tablet Take 10 mg by mouth daily.    . clindamycin (CLEOCIN T) 1 % external solution Apply 1 application topically 2 (two) times daily.    . Cyanocobalamin (VITAMIN B12 PO) Take 1 tablet by mouth daily.    . cyclobenzaprine (FLEXERIL) 5 MG tablet Take 5 mg by mouth 3 (three) times daily as needed for muscle spasms.    Marland Kitchen docusate sodium (COLACE) 100 MG capsule Take 100 mg by mouth 2 (two) times daily.    . fenofibrate 160 MG tablet Take 160 mg by mouth daily.    Marland Kitchen FLUoxetine (PROZAC) 40 MG capsule Take 40 mg by mouth daily.    . fluticasone (FLOVENT HFA) 110 MCG/ACT inhaler Inhale 1 puff into the lungs 2  (two) times daily.    Marland Kitchen ipratropium (ATROVENT HFA) 17 MCG/ACT inhaler Inhale 2 puffs into the lungs every 6 (six) hours.    Marland Kitchen isometheptene-acetaminophen-dichloralphenazone (MIDRIN) 65-100-325 MG capsule Take 1 capsule by mouth 4 (four) times daily as needed for migraine. Maximum 5 capsules in 12 hours for migraine headaches, 8 capsules in 24 hours for tension headaches.    . levothyroxine (SYNTHROID, LEVOTHROID) 100 MCG tablet Take 100 mcg by mouth every morning.    Marland Kitchen lisinopril (PRINIVIL,ZESTRIL) 20 MG tablet Take 1 tablet (20 mg total) by mouth daily. 14 tablet 0  . magnesium oxide (MAG-OX) 400 MG tablet Take 400 mg by mouth daily.    . montelukast (SINGULAIR) 10 MG tablet Take 10 mg by mouth at bedtime.    Marland Kitchen omeprazole (PRILOSEC) 40 MG capsule Take 40 mg by mouth daily.    Marland Kitchen triamcinolone (NASACORT ALLERGY 24HR) 55 MCG/ACT AERO nasal inhaler Place 2 sprays into the nose daily.    Marland Kitchen triamcinolone cream (KENALOG) 0.1 % Apply 1 application topically 2 (two) times daily as needed.     Marland Kitchen VICODIN 5-300 MG TABS Take 1 tablet by mouth 2 (two) times daily as needed.     No current facility-administered medications for this visit.    Facility-Administered Medications Ordered in Other Visits  Medication Dose Route Frequency Provider Last Rate Last Dose  . fentaNYL (SUBLIMAZE) injection 25 mcg  25 mcg Intravenous Q5 min PRN Alvin Critchley, MD      . ondansetron [facility]) injection 4 mg  4 mg Intravenous Once PRN Alvin Critchley, MD        Family History  Problem Relation Age of Onset  . Breast cancer Maternal Grandmother   . Bladder Cancer Father   . Prostate cancer Father   . Colon cancer Paternal Grandmother     Review of Systems  Constitutional: Negative.   HENT: Negative.   Eyes: Negative.   Respiratory: Negative.   Cardiovascular: Negative.   Gastrointestinal: Negative.   Endocrine: Negative.   Genitourinary: Negative.        Vaginal prolapse   Musculoskeletal: Negative.   Skin:  Negative.   Allergic/Immunologic: Negative.   Neurological: Negative.   Psychiatric/Behavioral: Negative.     Exam:   BP 128/70 (BP Location: Right Arm, Patient Position: Sitting, Cuff Size: Normal)   Pulse 84   Resp 18   Ht 5' 0.25" (1.53 m)   Wt 161 lb (73 kg)   BMI 31.18 kg/m   Weight change: @WEIGHTCHANGE @ Height:   Height: 5' 0.25" (153 cm)  Ht Readings from Last 3 Encounters:  06/12/17 5' 0.25" (1.53 m)  05/01/17 5\' 1"  (1.549 m)  11/08/16 5'  1" (1.549 m)    General appearance: alert, cooperative and appears stated age Head: Normocephalic, without obvious abnormality, atraumatic Neck: no adenopathy, supple, symmetrical, trachea midline and thyroid normal to inspection and palpation Lungs: clear to auscultation bilaterally Cardiovascular: regular rate and rhythm Abdomen: soft, non-tender; bowel sounds normal; no masses,  no organomegaly Extremities: extremities normal, atraumatic, no cyanosis or edema Skin: Skin color, texture, turgor normal. No rashes or lesions Lymph nodes: Cervical, supraclavicular, and axillary nodes normal. No abnormal inguinal nodes palpated Neurologic: Grossly normal   Pelvic: External genitalia:  no lesions              Urethra:  normal appearing urethra with no masses, tenderness or lesions              Bartholins and Skenes: normal                 Vagina: normal appearing vagina with normal color and discharge, no lesions  Grade 2 uterine prolapse and 1-2 cystocele. With standing and valsalva the cervix protrudes to 1 cm outside of the introitus.              Cervix: no lesions               Bimanual Exam:  Uterus:  normal size, contour, position, consistency, mobility, non-tender              Adnexa: no mass, fullness, tenderness               Rectovaginal: Confirms               Anus:  normal sphincter tone, no lesions, +external hemorrhoid  Chaperone was present for exam.  A:  Uterine prolapse, grade 2, mild cystocele. Discussed  options, do nothing, pessary and surgery. Reviewed the ACOG handout on prolapse.  Overactive bladder with some urge incontinence  Mild GSI  P:   She will return for pessary fitting  Kegel exercises  Once her prolapse is under control will see if her bladder symptoms change (sometimes GSI can worsen with correction of prolapse)  CC: Dr Yancey Flemings  Addendum: records from primary MD reviewed. Creatinine is mildly elevated at 1.04, GFR 54

## 2017-06-14 ENCOUNTER — Ambulatory Visit (INDEPENDENT_AMBULATORY_CARE_PROVIDER_SITE_OTHER): Payer: Medicare Other | Admitting: Obstetrics and Gynecology

## 2017-06-14 ENCOUNTER — Encounter: Payer: Self-pay | Admitting: Obstetrics and Gynecology

## 2017-06-14 VITALS — BP 138/80 | HR 84 | Resp 24 | Wt 160.0 lb

## 2017-06-14 DIAGNOSIS — N8111 Cystocele, midline: Secondary | ICD-10-CM | POA: Diagnosis not present

## 2017-06-14 DIAGNOSIS — N814 Uterovaginal prolapse, unspecified: Secondary | ICD-10-CM

## 2017-06-14 NOTE — Progress Notes (Signed)
GYNECOLOGY  VISIT   HPI: 66 y.o.   Married  Caucasian  female   G1P1001 with No LMP recorded. Patient is postmenopausal.   here for  Pessary fitting  For a grade 2 uterine prolapse and grade 1-2 cystocele. She currently has a respiratory issue and was just started on steroids. She does frequently cough and she does some heavy lifting caring for an ill family member.   GYNECOLOGIC HISTORY: No LMP recorded. Patient is postmenopausal. Contraception:postmenopause Menopausal hormone therapy: none         OB History    Gravida Para Term Preterm AB Living   1 1 1     1    SAB TAB Ectopic Multiple Live Births           1         There are no active problems to display for this patient.   Past Medical History:  Diagnosis Date  . Allergic rhinitis   . Anxiety   . Asthma   . Blood transfusion without reported diagnosis   . Depression   . Diverticulosis   . Dyspnea   . Esophagitis   . GERD (gastroesophageal reflux disease)   . Headache   . Heart murmur   . Hyperlipemia   . Hypertension   . Hypothyroidism   . Obesity   . Osteopenia   . Palpitations   . Pneumothorax   . Sleep apnea     Past Surgical History:  Procedure Laterality Date  . BUNIONECTOMY    . CATARACT EXTRACTION W/PHACO Right 10/11/2016   Procedure: CATARACT EXTRACTION PHACO AND INTRAOCULAR LENS PLACEMENT (IOC);  Surgeon: Birder Robson, MD;  Location: [facility] ORS;  Service: Ophthalmology;  Laterality: Right;  Lot# [id number] H [place]: 00:37.7 AP%: 18.1 CDE: 6.80  . CATARACT EXTRACTION W/PHACO Left 11/08/2016   Procedure: CATARACT EXTRACTION PHACO AND INTRAOCULAR LENS PLACEMENT (IOC);  Surgeon: Birder Robson, MD;  Location: [facility] ORS;  Service: Ophthalmology;  Laterality: Left;  PACK LOT: [id number] H US:00:32 AP:44 CDE:6.46  . COLONOSCOPY    . COLONOSCOPY WITH PROPOFOL N/A 11/26/2015   Procedure: COLONOSCOPY WITH PROPOFOL;  Surgeon: Manya Silvas, MD;  Location: [facility] ENDOSCOPY;  Service: Endoscopy;  Laterality: N/A;   . EYE SURGERY    . TONSILLECTOMY      Current Outpatient Prescriptions  Medication Sig Dispense Refill  . albuterol (PROVENTIL HFA;VENTOLIN HFA) 108 (90 Base) MCG/ACT inhaler Inhale 2 puffs into the lungs every 6 (six) hours as needed for wheezing or shortness of breath.    . ALPRAZolam (XANAX) 0.25 MG tablet Take 0.25 mg by mouth 2 (two) times daily as needed.    Marland Kitchen amLODipine (NORVASC) 5 MG tablet     . bisacodyl (DULCOLAX) 10 MG suppository Place 10 mg rectally as needed for moderate constipation.    . cetirizine (ZYRTEC) 10 MG tablet Take 10 mg by mouth daily.    . clindamycin (CLEOCIN T) 1 % external solution Apply 1 application topically 2 (two) times daily.    . Cyanocobalamin (VITAMIN B12 PO) Take 1 tablet by mouth daily.    . cyclobenzaprine (FLEXERIL) 5 MG tablet Take 5 mg by mouth 3 (three) times daily as needed for muscle spasms.    Marland Kitchen docusate sodium (COLACE) 100 MG capsule Take 100 mg by mouth 2 (two) times daily.    . fenofibrate 160 MG tablet Take 160 mg by mouth daily.    Marland Kitchen FLUoxetine (PROZAC) 40 MG capsule Take 40 mg by mouth daily.    Marland Kitchen  fluticasone (FLOVENT HFA) 110 MCG/ACT inhaler Inhale 1 puff into the lungs 2 (two) times daily.    Marland Kitchen ipratropium (ATROVENT HFA) 17 MCG/ACT inhaler Inhale 2 puffs into the lungs every 6 (six) hours.    Marland Kitchen isometheptene-acetaminophen-dichloralphenazone (MIDRIN) 65-100-325 MG capsule Take 1 capsule by mouth 4 (four) times daily as needed for migraine. Maximum 5 capsules in 12 hours for migraine headaches, 8 capsules in 24 hours for tension headaches.    . levothyroxine (SYNTHROID, LEVOTHROID) 100 MCG tablet Take 100 mcg by mouth every morning.    Marland Kitchen lisinopril (PRINIVIL,ZESTRIL) 20 MG tablet Take 1 tablet (20 mg total) by mouth daily. 14 tablet 0  . magnesium oxide (MAG-OX) 400 MG tablet Take 400 mg by mouth daily.    . montelukast (SINGULAIR) 10 MG tablet Take 10 mg by mouth at bedtime.    Marland Kitchen omeprazole (PRILOSEC) 40 MG capsule Take 40 mg by  mouth daily.    . prednisoLONE 5 MG TABS tablet Take by mouth.    . triamcinolone (NASACORT ALLERGY 24HR) 55 MCG/ACT AERO nasal inhaler Place 2 sprays into the nose daily.    Marland Kitchen triamcinolone cream (KENALOG) 0.1 % Apply 1 application topically 2 (two) times daily as needed.     Marland Kitchen VICODIN 5-300 MG TABS Take 1 tablet by mouth 2 (two) times daily as needed.     No current facility-administered medications for this visit.    Facility-Administered Medications Ordered in Other Visits  Medication Dose Route Frequency Provider Last Rate Last Dose  . fentaNYL (SUBLIMAZE) injection 25 mcg  25 mcg Intravenous Q5 min PRN Alvin Critchley, MD      . ondansetron [facility]) injection 4 mg  4 mg Intravenous Once PRN Alvin Critchley, MD         ALLERGIES: Advair diskus [fluticasone-salmeterol]; Codeine; Erythromycin; Morphine and related; Nsaids; and Sulfa antibiotics  Family History  Problem Relation Age of Onset  . Breast cancer Maternal Grandmother   . Stroke Maternal Grandmother   . Bladder Cancer Father   . Prostate cancer Father   . Heart attack Father   . Aortic aneurysm Father   . Congestive Heart Failure Father   . Colon cancer Paternal Grandmother   . Aneurysm Paternal Grandmother   . Stroke Paternal Grandmother   . Congestive Heart Failure Paternal Grandmother   . Dementia Mother   . Stroke Mother   . Dementia Maternal Grandfather   . Stroke Maternal Grandfather   . Dementia Paternal Grandfather   . Stroke Paternal Grandfather     Social History   Social History  . Marital status: Married    Spouse name: N/A  . Number of children: N/A  . Years of education: N/A   Occupational History  . Not on file.   Social History Main Topics  . Smoking status: Former Research scientist (life sciences)  . Smokeless tobacco: Never Used  . Alcohol use No  . Drug use: No  . Sexual activity: No   Other Topics Concern  . Not on file   Social History Narrative  . No narrative on file    Review of Systems   Constitutional: Negative.   HENT: Negative.   Eyes: Negative.   Respiratory: Negative.   Cardiovascular: Negative.   Gastrointestinal: Negative.   Genitourinary:       Urine prolapse   Musculoskeletal: Negative.   Skin: Negative.   Neurological: Negative.   Endo/Heme/Allergies: Negative.   Psychiatric/Behavioral: Negative.     PHYSICAL EXAMINATION:    BP 138/80 (BP  Location: Right Arm, Patient Position: Sitting, Cuff Size: Normal)   Pulse 84   Resp (!) 24   Wt 160 lb (72.6 kg)   BMI 30.99 kg/m     General appearance: alert, cooperative and appears stated age  Pelvic: External genitalia:  no lesions              Urethra:  normal appearing urethra with no masses, tenderness or lesions              Bartholins and Skenes: normal                 Vagina: normal appearing vagina with normal color and discharge, no lesions              Cervix: non tender  She was fitted with the #3 ring pessary, felt slightly uncomfortable with the pessary in. Seemed to fit well on vaginal exam Fitted with the #2 ring pessary with support, comfortable, held her prolapse, but started to come out with a hard cough Retried the #3, she stated she could feel it a little, not having pain. Doesn't come out with coughing.  She wanted to use the #3 ring pessary with support, the fitting pessary was removed and the regular pessary was placed.  Chaperone was present for exam.  ASSESSMENT Genital prolapse Pessary fitting    PLAN #3 ring pessary with support placed, discussed that she really shouldn't feel the pessary. She didn't want to go home with the #2 pessary because it was coming out with hard coughing.  F/U tomorrow for pessary check If she is not comfortable will then try the #2 We discussed that the heavy lifting and straining could worsen her prolapse Discussed that she might need vaginal estrogen   An After Visit Summary was printed and given to the patient.  In addition to the pessary  fitting, at least 10 minutes was spent face to face in counseling

## 2017-06-15 ENCOUNTER — Ambulatory Visit (INDEPENDENT_AMBULATORY_CARE_PROVIDER_SITE_OTHER): Payer: Medicare Other | Admitting: Obstetrics and Gynecology

## 2017-06-15 ENCOUNTER — Encounter: Payer: Self-pay | Admitting: Obstetrics and Gynecology

## 2017-06-15 VITALS — BP 140/86 | HR 92 | Resp 20 | Ht 60.25 in | Wt 160.0 lb

## 2017-06-15 DIAGNOSIS — Z4689 Encounter for fitting and adjustment of other specified devices: Secondary | ICD-10-CM | POA: Diagnosis not present

## 2017-06-15 DIAGNOSIS — N819 Female genital prolapse, unspecified: Secondary | ICD-10-CM | POA: Diagnosis not present

## 2017-06-15 DIAGNOSIS — N952 Postmenopausal atrophic vaginitis: Secondary | ICD-10-CM | POA: Diagnosis not present

## 2017-06-15 MED ORDER — ESTRADIOL 0.1 MG/GM VA CREA: TOPICAL_CREAM | VAGINAL | 1 refills | Status: DC

## 2017-06-15 NOTE — Progress Notes (Signed)
GYNECOLOGY  VISIT   HPI: 66 y.o.   Married  Caucasian  female   G1P1001 with No LMP recorded. Patient is postmenopausal.   here for  Pessary check. She was fitted with a #3 ring pessary with support. She slept better, thinks it helped her lower back pain. Holding the prolapse in. No worsening in her baseline LLQ abdominal pain with this pessary. Overall feels better. She is leaking some urine with coughing, in the past would only leak with a full bladder, now a little more with coughing. Feels like she is emptying her bladder okay. She hasn't had a BM yet.     GYNECOLOGIC HISTORY: No LMP recorded. Patient is postmenopausal. Contraception: post menopausal  Menopausal hormone therapy: none        OB History    Gravida Para Term Preterm AB Living   1 1 1     1    SAB TAB Ectopic Multiple Live Births           1         There are no active problems to display for this patient.   Past Medical History:  Diagnosis Date  . Allergic rhinitis   . Anxiety   . Asthma   . Blood transfusion without reported diagnosis   . Depression   . Diverticulosis   . Dyspnea   . Esophagitis   . GERD (gastroesophageal reflux disease)   . Headache   . Heart murmur   . Hyperlipemia   . Hypertension   . Hypothyroidism   . Obesity   . Osteopenia   . Palpitations   . Pneumothorax   . Sleep apnea     Past Surgical History:  Procedure Laterality Date  . BUNIONECTOMY    . CATARACT EXTRACTION W/PHACO Right 10/11/2016   Procedure: CATARACT EXTRACTION PHACO AND INTRAOCULAR LENS PLACEMENT (IOC);  Surgeon: Birder Robson, MD;  Location: [facility] ORS;  Service: Ophthalmology;  Laterality: Right;  Lot# [id number] H [place]: 00:37.7 AP%: 18.1 CDE: 6.80  . CATARACT EXTRACTION W/PHACO Left 11/08/2016   Procedure: CATARACT EXTRACTION PHACO AND INTRAOCULAR LENS PLACEMENT (IOC);  Surgeon: Birder Robson, MD;  Location: [facility] ORS;  Service: Ophthalmology;  Laterality: Left;  PACK LOT: [id number] H US:00:32 AP:44 CDE:6.46   . COLONOSCOPY    . COLONOSCOPY WITH PROPOFOL N/A 11/26/2015   Procedure: COLONOSCOPY WITH PROPOFOL;  Surgeon: Manya Silvas, MD;  Location: [facility] ENDOSCOPY;  Service: Endoscopy;  Laterality: N/A;  . EYE SURGERY    . TONSILLECTOMY      Current Outpatient Prescriptions  Medication Sig Dispense Refill  . albuterol (PROVENTIL HFA;VENTOLIN HFA) 108 (90 Base) MCG/ACT inhaler Inhale 2 puffs into the lungs every 6 (six) hours as needed for wheezing or shortness of breath.    . ALPRAZolam (XANAX) 0.25 MG tablet Take 0.25 mg by mouth 2 (two) times daily as needed.    Marland Kitchen amLODipine (NORVASC) 5 MG tablet     . bisacodyl (DULCOLAX) 10 MG suppository Place 10 mg rectally as needed for moderate constipation.    . cetirizine (ZYRTEC) 10 MG tablet Take 10 mg by mouth daily.    . clindamycin (CLEOCIN T) 1 % external solution Apply 1 application topically 2 (two) times daily.    . Cyanocobalamin (VITAMIN B12 PO) Take 1 tablet by mouth daily.    . cyclobenzaprine (FLEXERIL) 5 MG tablet Take 5 mg by mouth 3 (three) times daily as needed for muscle spasms.    Marland Kitchen docusate sodium (COLACE) 100 MG capsule Take 100  mg by mouth 2 (two) times daily.    . fenofibrate 160 MG tablet Take 160 mg by mouth daily.    Marland Kitchen FLUoxetine (PROZAC) 40 MG capsule Take 40 mg by mouth daily.    . fluticasone (FLOVENT HFA) 110 MCG/ACT inhaler Inhale 1 puff into the lungs 2 (two) times daily.    Marland Kitchen ipratropium (ATROVENT HFA) 17 MCG/ACT inhaler Inhale 2 puffs into the lungs every 6 (six) hours.    Marland Kitchen isometheptene-acetaminophen-dichloralphenazone (MIDRIN) 65-100-325 MG capsule Take 1 capsule by mouth 4 (four) times daily as needed for migraine. Maximum 5 capsules in 12 hours for migraine headaches, 8 capsules in 24 hours for tension headaches.    . levothyroxine (SYNTHROID, LEVOTHROID) 100 MCG tablet Take 100 mcg by mouth every morning.    Marland Kitchen lisinopril (PRINIVIL,ZESTRIL) 20 MG tablet Take 1 tablet (20 mg total) by mouth daily. 14 tablet 0  .  magnesium oxide (MAG-OX) 400 MG tablet Take 400 mg by mouth daily.    . montelukast (SINGULAIR) 10 MG tablet Take 10 mg by mouth at bedtime.    Marland Kitchen omeprazole (PRILOSEC) 40 MG capsule Take 40 mg by mouth daily.    . prednisoLONE 5 MG TABS tablet Take by mouth.    . triamcinolone (NASACORT ALLERGY 24HR) 55 MCG/ACT AERO nasal inhaler Place 2 sprays into the nose daily.    Marland Kitchen triamcinolone cream (KENALOG) 0.1 % Apply 1 application topically 2 (two) times daily as needed.     Marland Kitchen VICODIN 5-300 MG TABS Take 1 tablet by mouth 2 (two) times daily as needed.     No current facility-administered medications for this visit.    Facility-Administered Medications Ordered in Other Visits  Medication Dose Route Frequency Provider Last Rate Last Dose  . fentaNYL (SUBLIMAZE) injection 25 mcg  25 mcg Intravenous Q5 min PRN Alvin Critchley, MD      . ondansetron [facility]) injection 4 mg  4 mg Intravenous Once PRN Alvin Critchley, MD         ALLERGIES: Advair diskus [fluticasone-salmeterol]; Codeine; Erythromycin; Morphine and related; Nsaids; and Sulfa antibiotics  Family History  Problem Relation Age of Onset  . Breast cancer Maternal Grandmother   . Stroke Maternal Grandmother   . Bladder Cancer Father   . Prostate cancer Father   . Heart attack Father   . Aortic aneurysm Father   . Congestive Heart Failure Father   . Colon cancer Paternal Grandmother   . Aneurysm Paternal Grandmother   . Stroke Paternal Grandmother   . Congestive Heart Failure Paternal Grandmother   . Dementia Mother   . Stroke Mother   . Dementia Maternal Grandfather   . Stroke Maternal Grandfather   . Dementia Paternal Grandfather   . Stroke Paternal Grandfather     Social History   Social History  . Marital status: Married    Spouse name: N/A  . Number of children: N/A  . Years of education: N/A   Occupational History  . Not on file.   Social History Main Topics  . Smoking status: Former Research scientist (life sciences)  . Smokeless tobacco:  Never Used  . Alcohol use No  . Drug use: No  . Sexual activity: No   Other Topics Concern  . Not on file   Social History Narrative  . No narrative on file    Review of Systems  Constitutional: Negative.   HENT: Negative.   Eyes: Negative.   Respiratory: Negative.   Cardiovascular: Negative.   Gastrointestinal: Negative.   Genitourinary:  Negative.   Musculoskeletal: Negative.   Skin: Negative.   Neurological: Negative.   Endo/Heme/Allergies: Negative.   Psychiatric/Behavioral: Negative.     PHYSICAL EXAMINATION:    BP 140/86 (BP Location: Right Arm, Patient Position: Sitting, Cuff Size: Normal)   Pulse 92   Resp 20   Ht 5' 0.25" (1.53 m)   Wt 160 lb (72.6 kg)   BMI 30.99 kg/m     General appearance: alert, cooperative and appears stated age  Pelvic: External genitalia:  no lesions              Urethra:  normal appearing urethra with no masses, tenderness or lesions              Bartholins and Skenes: normal                 Vagina: the pessary was removed and cleaned. The vagina is atrophic, slight erythema              Cervix: no lesions  Chaperone was present for exam.  ASSESSMENT Genital prolapse Vaginal atrophy Slight worsening in her GSI Some urge incontinence    PLAN Start Estrace vaginal cream  Continue with the #3 ring pessary with support F/U in 1 week Will monitor her incontinence with the pessary and estrace cream    An After Visit Summary was printed and given to the patient.  15 minutes face to face time of which over 50% was spent in counseling.

## 2017-06-22 ENCOUNTER — Ambulatory Visit (INDEPENDENT_AMBULATORY_CARE_PROVIDER_SITE_OTHER): Payer: Medicare Other | Admitting: Obstetrics and Gynecology

## 2017-06-22 ENCOUNTER — Encounter: Payer: Self-pay | Admitting: Obstetrics and Gynecology

## 2017-06-22 VITALS — BP 124/68 | HR 80 | Resp 16 | Wt 162.0 lb

## 2017-06-22 DIAGNOSIS — G8929 Other chronic pain: Secondary | ICD-10-CM

## 2017-06-22 DIAGNOSIS — R1032 Left lower quadrant pain: Secondary | ICD-10-CM | POA: Diagnosis not present

## 2017-06-22 DIAGNOSIS — M549 Dorsalgia, unspecified: Secondary | ICD-10-CM

## 2017-06-22 DIAGNOSIS — Z4689 Encounter for fitting and adjustment of other specified devices: Secondary | ICD-10-CM

## 2017-06-22 DIAGNOSIS — N952 Postmenopausal atrophic vaginitis: Secondary | ICD-10-CM | POA: Diagnosis not present

## 2017-06-22 NOTE — Progress Notes (Addendum)
GYNECOLOGY  VISIT   HPI: 66 y.o.   Married  Caucasian  female   G1P1001 with No LMP recorded. Patient is postmenopausal.   here for pessary follow up. She was fitted with a #3 ring pessary with support last week. She was seen 24 hours after insertion and was having some slight vaginal erythema and was started on vaginal estrogen cream. She is feeling so much better, "I wish I did this a year ago". Feels her bowel movements are better, lower back pain is better. Voiding well. She feels like a different person.  Her chronic LLQ abdominal pain is unchanged. Prior negative CT Her incontinence is a little better this week compared to when we first inserted the pessary.  She is on another round of antibiotics and steroids for a respiratory infection/asthma.     GYNECOLOGIC HISTORY: No LMP recorded. Patient is postmenopausal. Contraception:postmenpause  Menopausal hormone therapy: none         OB History    Gravida Para Term Preterm AB Living   1 1 1     1    SAB TAB Ectopic Multiple Live Births           1         There are no active problems to display for this patient.   Past Medical History:  Diagnosis Date  . Allergic rhinitis   . Anxiety   . Asthma   . Blood transfusion without reported diagnosis   . Depression   . Diverticulosis   . Dyspnea   . Esophagitis   . GERD (gastroesophageal reflux disease)   . Headache   . Heart murmur   . Hyperlipemia   . Hypertension   . Hypothyroidism   . Obesity   . Osteopenia   . Palpitations   . Pneumothorax   . Sleep apnea     Past Surgical History:  Procedure Laterality Date  . BUNIONECTOMY    . CATARACT EXTRACTION W/PHACO Right 10/11/2016   Procedure: CATARACT EXTRACTION PHACO AND INTRAOCULAR LENS PLACEMENT (IOC);  Surgeon: Birder Robson, MD;  Location: [facility] ORS;  Service: Ophthalmology;  Laterality: Right;  Lot# [id number] H [place]: 00:37.7 AP%: 18.1 CDE: 6.80  . CATARACT EXTRACTION W/PHACO Left 11/08/2016   Procedure: CATARACT  EXTRACTION PHACO AND INTRAOCULAR LENS PLACEMENT (IOC);  Surgeon: Birder Robson, MD;  Location: [facility] ORS;  Service: Ophthalmology;  Laterality: Left;  PACK LOT: [id number] H US:00:32 AP:44 CDE:6.46  . COLONOSCOPY    . COLONOSCOPY WITH PROPOFOL N/A 11/26/2015   Procedure: COLONOSCOPY WITH PROPOFOL;  Surgeon: Manya Silvas, MD;  Location: [facility] ENDOSCOPY;  Service: Endoscopy;  Laterality: N/A;  . EYE SURGERY    . TONSILLECTOMY      Current Outpatient Prescriptions  Medication Sig Dispense Refill  . albuterol (PROVENTIL HFA;VENTOLIN HFA) 108 (90 Base) MCG/ACT inhaler Inhale 2 puffs into the lungs every 6 (six) hours as needed for wheezing or shortness of breath.    . ALPRAZolam (XANAX) 0.25 MG tablet Take 0.25 mg by mouth 2 (two) times daily as needed.    Marland Kitchen amLODipine (NORVASC) 5 MG tablet     . azithromycin (ZITHROMAX) 250 MG tablet     . bisacodyl (DULCOLAX) 10 MG suppository Place 10 mg rectally as needed for moderate constipation.    . cetirizine (ZYRTEC) 10 MG tablet Take 10 mg by mouth daily.    . clindamycin (CLEOCIN T) 1 % external solution Apply 1 application topically 2 (two) times daily.    . Cyanocobalamin (VITAMIN B12 PO)  Take 1 tablet by mouth daily.    . cyclobenzaprine (FLEXERIL) 5 MG tablet Take 5 mg by mouth 3 (three) times daily as needed for muscle spasms.    Marland Kitchen docusate sodium (COLACE) 100 MG capsule Take 100 mg by mouth 2 (two) times daily.    Marland Kitchen estradiol (ESTRACE) 0.1 MG/GM vaginal cream 1 gram vaginally every night for one week, then twice weekly at bedtime 42.5 g 1  . fenofibrate 160 MG tablet Take 160 mg by mouth daily.    Marland Kitchen FLUoxetine (PROZAC) 40 MG capsule Take 40 mg by mouth daily.    . fluticasone (FLOVENT HFA) 110 MCG/ACT inhaler Inhale 1 puff into the lungs 2 (two) times daily.    Marland Kitchen ipratropium (ATROVENT HFA) 17 MCG/ACT inhaler Inhale 2 puffs into the lungs every 6 (six) hours.    Marland Kitchen isometheptene-acetaminophen-dichloralphenazone (MIDRIN) 65-100-325 MG capsule  Take 1 capsule by mouth 4 (four) times daily as needed for migraine. Maximum 5 capsules in 12 hours for migraine headaches, 8 capsules in 24 hours for tension headaches.    . levothyroxine (SYNTHROID, LEVOTHROID) 100 MCG tablet Take 100 mcg by mouth every morning.    Marland Kitchen lisinopril (PRINIVIL,ZESTRIL) 20 MG tablet Take 1 tablet (20 mg total) by mouth daily. 14 tablet 0  . magnesium oxide (MAG-OX) 400 MG tablet Take 400 mg by mouth daily.    . montelukast (SINGULAIR) 10 MG tablet Take 10 mg by mouth at bedtime.    Marland Kitchen omeprazole (PRILOSEC) 40 MG capsule Take 40 mg by mouth daily.    . predniSONE (STERAPRED UNI-PAK 21 TAB) 10 MG (21) TBPK tablet     . triamcinolone (NASACORT ALLERGY 24HR) 55 MCG/ACT AERO nasal inhaler Place 2 sprays into the nose daily.    Marland Kitchen triamcinolone cream (KENALOG) 0.1 % Apply 1 application topically 2 (two) times daily as needed.     Marland Kitchen VICODIN 5-300 MG TABS Take 1 tablet by mouth 2 (two) times daily as needed.     No current facility-administered medications for this visit.    Facility-Administered Medications Ordered in Other Visits  Medication Dose Route Frequency Provider Last Rate Last Dose  . fentaNYL (SUBLIMAZE) injection 25 mcg  25 mcg Intravenous Q5 min PRN Alvin Critchley, MD      . ondansetron [facility]) injection 4 mg  4 mg Intravenous Once PRN Alvin Critchley, MD         ALLERGIES: Advair diskus [fluticasone-salmeterol]; Codeine; Erythromycin; Morphine and related; Nsaids; and Sulfa antibiotics  Family History  Problem Relation Age of Onset  . Breast cancer Maternal Grandmother   . Stroke Maternal Grandmother   . Bladder Cancer Father   . Prostate cancer Father   . Heart attack Father   . Aortic aneurysm Father   . Congestive Heart Failure Father   . Colon cancer Paternal Grandmother   . Aneurysm Paternal Grandmother   . Stroke Paternal Grandmother   . Congestive Heart Failure Paternal Grandmother   . Dementia Mother   . Stroke Mother   . Dementia Maternal  Grandfather   . Stroke Maternal Grandfather   . Dementia Paternal Grandfather   . Stroke Paternal Grandfather     Social History   Social History  . Marital status: Married    Spouse name: N/A  . Number of children: N/A  . Years of education: N/A   Occupational History  . Not on file.   Social History Main Topics  . Smoking status: Former Research scientist (life sciences)  . Smokeless tobacco: Never Used  .  Alcohol use No  . Drug use: No  . Sexual activity: No   Other Topics Concern  . Not on file   Social History Narrative  . No narrative on file    Review of Systems  Constitutional: Negative.   HENT: Negative.   Eyes: Negative.   Respiratory: Negative.   Cardiovascular: Negative.   Gastrointestinal: Negative.   Genitourinary: Negative.        Loss of urine with sneeze or cough   Musculoskeletal: Negative.   Skin: Negative.   Neurological: Positive for headaches.  Endo/Heme/Allergies: Negative.   Psychiatric/Behavioral: Negative.     PHYSICAL EXAMINATION:    BP 124/68 (BP Location: Right Arm, Patient Position: Sitting, Cuff Size: Normal)   Pulse 80   Resp 16   Wt 162 lb (73.5 kg)   BMI 31.38 kg/m     General appearance: alert, cooperative and appears stated age  Pelvic: External genitalia:  no lesions              Urethra:  normal appearing urethra with no masses, tenderness or lesions              Bartholins and Skenes: normal                 Vagina: the pessary was removed and cleaned. The vagina looks better estrogenized this week. No significant erythema, no erosions, no granulation tissue. The pessary was replaced.              Cervix: no lesions              Bimanual Exam:  Uterus:  normal size, contour, position, consistency, mobility, non-tender              Adnexa: no mass, fullness, tenderness                Chaperone was present for exam.  ASSESSMENT Genital prolapse, feeling so much better with the pessary in. Doing well Vaginal atrophy, already improving with  one week of estrace treatment Back pain, improved Chronic LLQ abdominal pain, normal pelvic exam, reviewed prior negative abdominal/pelvic CT report    PLAN Continue with the pessary F/U in 3 months Space the estrace cream to 2 x a week Call with any concerns or changes F/U with her primary for her abdominal pain, don't think it is from a gyn source   An After Visit Summary was printed and given to the patient.  CC: Dr Chancy Milroy

## 2017-06-29 DIAGNOSIS — H43813 Vitreous degeneration, bilateral: Secondary | ICD-10-CM | POA: Diagnosis not present

## 2017-07-06 ENCOUNTER — Telehealth: Payer: Self-pay | Admitting: Obstetrics and Gynecology

## 2017-07-06 NOTE — Telephone Encounter (Signed)
Patient has a yeast infection after taking antibiotics. Patient used a 3 day OTC treatment and her symptoms have gone away. Patient is asking if she should try another 3 day treatment,  7 day treatment or the oral treatment?

## 2017-07-06 NOTE — Telephone Encounter (Signed)
Given that she self treated and didn't get better, I think she needs to be seen.

## 2017-07-06 NOTE — Telephone Encounter (Signed)
Spoke with patient. Patient requesting medication for yeast infection.  Reports completing two "Z packs" for asthma prescribed by PCP. Experienced vaginal burning, itching and feeling "raw". Self treated with OTC Monistat 3 day. Reports continued vaginal burning and itching.   Recently started estrace vaginal cream, now using twice a week. Patient states she has f/u scheduled for 07/25/17.  Advised patient would review with Dr. Talbert Nan and return call with recommendations, patient is agreeable.  Dr. Talbert Nan, please review and advise?

## 2017-07-07 ENCOUNTER — Ambulatory Visit (INDEPENDENT_AMBULATORY_CARE_PROVIDER_SITE_OTHER): Payer: Medicare Other | Admitting: Obstetrics and Gynecology

## 2017-07-07 ENCOUNTER — Encounter: Payer: Self-pay | Admitting: Obstetrics and Gynecology

## 2017-07-07 VITALS — BP 138/80 | HR 80 | Resp 16 | Wt 163.0 lb

## 2017-07-07 DIAGNOSIS — N760 Acute vaginitis: Secondary | ICD-10-CM

## 2017-07-07 MED ORDER — FLUCONAZOLE 150 MG PO TABS: 150.0000 mg | ORAL_TABLET | Freq: Once | ORAL | 0 refills | Status: AC

## 2017-07-07 MED ORDER — BETAMETHASONE VALERATE 0.1 % EX OINT: TOPICAL_OINTMENT | CUTANEOUS | 0 refills | Status: DC

## 2017-07-07 NOTE — Progress Notes (Signed)
GYNECOLOGY  VISIT   HPI: 66 y.o.   Married  Caucasian  female   G1P1001 with No LMP recorded. Patient is postmenopausal.   here c/o vaginal itching. Patient recently finished 2 rounds of azithromycin and developed vulvar itching and burning was raw. Patient tried OTC monistat 3,with only mild relief. Now with just itching and some irritation. Not sure if she has any bladder symptoms. Some urgency to void. No frequency or burning.      GYNECOLOGIC HISTORY: No LMP recorded. Patient is postmenopausal. Contraception:postmenopause  Menopausal hormone therapy: Estrace         OB History    Gravida Para Term Preterm AB Living   1 1 1     1    SAB TAB Ectopic Multiple Live Births           1         There are no active problems to display for this patient.   Past Medical History:  Diagnosis Date  . Allergic rhinitis   . Anxiety   . Asthma   . Blood transfusion without reported diagnosis   . Depression   . Diverticulosis   . Dyspnea   . Esophagitis   . GERD (gastroesophageal reflux disease)   . Headache   . Heart murmur   . Hyperlipemia   . Hypertension   . Hypothyroidism   . Obesity   . Osteopenia   . Palpitations   . Pneumothorax   . Sleep apnea     Past Surgical History:  Procedure Laterality Date  . BUNIONECTOMY    . CATARACT EXTRACTION W/PHACO Right 10/11/2016   Procedure: CATARACT EXTRACTION PHACO AND INTRAOCULAR LENS PLACEMENT (IOC);  Surgeon: Birder Robson, MD;  Location: [facility] ORS;  Service: Ophthalmology;  Laterality: Right;  Lot# [id number] H [place]: 00:37.7 AP%: 18.1 CDE: 6.80  . CATARACT EXTRACTION W/PHACO Left 11/08/2016   Procedure: CATARACT EXTRACTION PHACO AND INTRAOCULAR LENS PLACEMENT (IOC);  Surgeon: Birder Robson, MD;  Location: [facility] ORS;  Service: Ophthalmology;  Laterality: Left;  PACK LOT: [id number] H US:00:32 AP:44 CDE:6.46  . COLONOSCOPY    . COLONOSCOPY WITH PROPOFOL N/A 11/26/2015   Procedure: COLONOSCOPY WITH PROPOFOL;  Surgeon: Manya Silvas, MD;  Location: [facility] ENDOSCOPY;  Service: Endoscopy;  Laterality: N/A;  . EYE SURGERY    . TONSILLECTOMY      Current Outpatient Prescriptions  Medication Sig Dispense Refill  . albuterol (PROVENTIL HFA;VENTOLIN HFA) 108 (90 Base) MCG/ACT inhaler Inhale 2 puffs into the lungs every 6 (six) hours as needed for wheezing or shortness of breath.    . ALPRAZolam (XANAX) 0.25 MG tablet Take 0.25 mg by mouth 2 (two) times daily as needed.    Marland Kitchen amLODipine (NORVASC) 5 MG tablet     . bisacodyl (DULCOLAX) 10 MG suppository Place 10 mg rectally as needed for moderate constipation.    . cetirizine (ZYRTEC) 10 MG tablet Take 10 mg by mouth daily.    . clindamycin (CLEOCIN T) 1 % external solution Apply 1 application topically 2 (two) times daily.    . Cyanocobalamin (VITAMIN B12 PO) Take 1 tablet by mouth daily.    . cyclobenzaprine (FLEXERIL) 5 MG tablet Take 5 mg by mouth 3 (three) times daily as needed for muscle spasms.    Marland Kitchen docusate sodium (COLACE) 100 MG capsule Take 100 mg by mouth 2 (two) times daily.    Marland Kitchen estradiol (ESTRACE) 0.1 MG/GM vaginal cream 1 gram vaginally every night for one week, then twice weekly at bedtime  42.5 g 1  . fenofibrate 160 MG tablet Take 160 mg by mouth daily.    Marland Kitchen FLUoxetine (PROZAC) 40 MG capsule Take 40 mg by mouth daily.    . fluticasone (FLOVENT HFA) 110 MCG/ACT inhaler Inhale 1 puff into the lungs 2 (two) times daily.    Marland Kitchen ipratropium (ATROVENT HFA) 17 MCG/ACT inhaler Inhale 2 puffs into the lungs every 6 (six) hours.    Marland Kitchen isometheptene-acetaminophen-dichloralphenazone (MIDRIN) 65-100-325 MG capsule Take 1 capsule by mouth 4 (four) times daily as needed for migraine. Maximum 5 capsules in 12 hours for migraine headaches, 8 capsules in 24 hours for tension headaches.    . levothyroxine (SYNTHROID, LEVOTHROID) 100 MCG tablet Take 100 mcg by mouth every morning.    Marland Kitchen lisinopril (PRINIVIL,ZESTRIL) 20 MG tablet Take 1 tablet (20 mg total) by mouth daily. 14 tablet  0  . magnesium oxide (MAG-OX) 400 MG tablet Take 400 mg by mouth daily.    . montelukast (SINGULAIR) 10 MG tablet Take 10 mg by mouth at bedtime.    Marland Kitchen omeprazole (PRILOSEC) 40 MG capsule Take 40 mg by mouth daily.    Marland Kitchen triamcinolone (NASACORT ALLERGY 24HR) 55 MCG/ACT AERO nasal inhaler Place 2 sprays into the nose daily.    Marland Kitchen triamcinolone cream (KENALOG) 0.1 % Apply 1 application topically 2 (two) times daily as needed.     Marland Kitchen VICODIN 5-300 MG TABS Take 1 tablet by mouth 2 (two) times daily as needed.    . predniSONE (STERAPRED UNI-PAK 21 TAB) 10 MG (21) TBPK tablet      No current facility-administered medications for this visit.    Facility-Administered Medications Ordered in Other Visits  Medication Dose Route Frequency Provider Last Rate Last Dose  . fentaNYL (SUBLIMAZE) injection 25 mcg  25 mcg Intravenous Q5 min PRN Alvin Critchley, MD      . ondansetron [facility]) injection 4 mg  4 mg Intravenous Once PRN Alvin Critchley, MD         ALLERGIES: Advair diskus [fluticasone-salmeterol]; Codeine; Erythromycin; Morphine and related; Nsaids; and Sulfa antibiotics  Family History  Problem Relation Age of Onset  . Breast cancer Maternal Grandmother   . Stroke Maternal Grandmother   . Bladder Cancer Father   . Prostate cancer Father   . Heart attack Father   . Aortic aneurysm Father   . Congestive Heart Failure Father   . Colon cancer Paternal Grandmother   . Aneurysm Paternal Grandmother   . Stroke Paternal Grandmother   . Congestive Heart Failure Paternal Grandmother   . Dementia Mother   . Stroke Mother   . Dementia Maternal Grandfather   . Stroke Maternal Grandfather   . Dementia Paternal Grandfather   . Stroke Paternal Grandfather     Social History   Social History  . Marital status: Married    Spouse name: N/A  . Number of children: N/A  . Years of education: N/A   Occupational History  . Not on file.   Social History Main Topics  . Smoking status: Former Research scientist (life sciences)  .  Smokeless tobacco: Never Used  . Alcohol use No  . Drug use: No  . Sexual activity: No   Other Topics Concern  . Not on file   Social History Narrative  . No narrative on file    Review of Systems  Constitutional: Negative.   HENT: Negative.   Eyes: Negative.   Respiratory: Negative.   Cardiovascular: Negative.   Gastrointestinal: Negative.   Genitourinary:  Vaginal itching   Musculoskeletal: Negative.   Skin: Negative.   Neurological: Negative.   Endo/Heme/Allergies: Negative.   Psychiatric/Behavioral: Negative.     PHYSICAL EXAMINATION:    BP 138/80 (BP Location: Right Arm, Patient Position: Sitting, Cuff Size: Normal)   Pulse 80   Resp 16   Wt 163 lb (73.9 kg)   BMI 31.57 kg/m     General appearance: alert, cooperative and appears stated age   Pelvic: External genitalia:  no lesions, mild erythema              Urethra:  normal appearing urethra with no masses, tenderness or lesions              Bartholins and Skenes: normal                 Vagina: normal appearing vagina with slight increase in white, creamy vaginal discharge, no lesions  Pessary in place, not removed               Chaperone was present for exam.  Wet prep: ? clue, no trich, + wbc KOH: not clearly seen yeast PH: 4  Urine dip: negative   ASSESSMENT Vulvovaginitis, suspect partially treated yeast     PLAN Diflucan, she will take one tablet now. If Affirm + for yeast and still symptomatic in 3 days will take the second tablet.  Steroid ointment Send Affirm She has a f/u pessary check in early September   An After Visit Summary was printed and given to the patient.

## 2017-07-07 NOTE — Telephone Encounter (Signed)
Spoke with patient and given Dr. Gentry Fitz message. Made appointment to see Dr.Jertson today at 2:30pm.

## 2017-07-08 LAB — VAGINITIS/VAGINOSIS, DNA PROBE
CANDIDA SPECIES: NEGATIVE
GARDNERELLA VAGINALIS: NEGATIVE
Trichomonas vaginosis: NEGATIVE

## 2017-07-11 DIAGNOSIS — J449 Chronic obstructive pulmonary disease, unspecified: Secondary | ICD-10-CM | POA: Diagnosis not present

## 2017-07-11 DIAGNOSIS — J069 Acute upper respiratory infection, unspecified: Secondary | ICD-10-CM | POA: Diagnosis not present

## 2017-07-11 DIAGNOSIS — I10 Essential (primary) hypertension: Secondary | ICD-10-CM | POA: Diagnosis not present

## 2017-07-25 ENCOUNTER — Encounter: Payer: Self-pay | Admitting: Obstetrics and Gynecology

## 2017-07-25 ENCOUNTER — Ambulatory Visit (INDEPENDENT_AMBULATORY_CARE_PROVIDER_SITE_OTHER): Payer: Medicare Other | Admitting: Obstetrics and Gynecology

## 2017-07-25 VITALS — BP 120/70 | HR 84 | Resp 14 | Wt 162.0 lb

## 2017-07-25 DIAGNOSIS — Z4689 Encounter for fitting and adjustment of other specified devices: Secondary | ICD-10-CM | POA: Diagnosis not present

## 2017-07-25 DIAGNOSIS — N952 Postmenopausal atrophic vaginitis: Secondary | ICD-10-CM

## 2017-07-25 NOTE — Progress Notes (Signed)
GYNECOLOGY  VISIT   HPI: 66 y.o.   Married  Caucasian  female   G1P1001 with No LMP recorded. Patient is postmenopausal.   here for pessary follow up. She was fitted with a #3 ring pessary with support in 7/18. She was started on vaginal estrogen to prevent vaginal irritation.  She thinks the pessary may be a little lower, she could feel it easier when she checked for it. No vaginal bulge. Her back pain got better after the pessary was inserted, now bothering her again intermittently. Definitely still improved with the pessary in. Normal BM, still some leakage if she coughs too hard. Not leaking every day, overall tolerable. Leaks small amounts. Using the estrogen 2 x a week. No vaginal bleeding or vaginal d/c. Breasts are slightly tender bilaterally since using the estrogen (she is using 1 gram of estrace cream 2 x a week)  GYNECOLOGIC HISTORY: No LMP recorded. Patient is postmenopausal. Contraception:postmenopause  Menopausal hormone therapy: none         OB History    Gravida Para Term Preterm AB Living   1 1 1     1    SAB TAB Ectopic Multiple Live Births           1         There are no active problems to display for this patient.   Past Medical History:  Diagnosis Date  . Allergic rhinitis   . Anxiety   . Asthma   . Blood transfusion without reported diagnosis   . Depression   . Diverticulosis   . Dyspnea   . Esophagitis   . GERD (gastroesophageal reflux disease)   . Headache   . Heart murmur   . Hyperlipemia   . Hypertension   . Hypothyroidism   . Obesity   . Osteopenia   . Palpitations   . Pneumothorax   . Sleep apnea     Past Surgical History:  Procedure Laterality Date  . BUNIONECTOMY    . CATARACT EXTRACTION W/PHACO Right 10/11/2016   Procedure: CATARACT EXTRACTION PHACO AND INTRAOCULAR LENS PLACEMENT (IOC);  Surgeon: Birder Robson, MD;  Location: [facility] ORS;  Service: Ophthalmology;  Laterality: Right;  Lot# [id number] H [place]: 00:37.7 AP%: 18.1 CDE: 6.80   . CATARACT EXTRACTION W/PHACO Left 11/08/2016   Procedure: CATARACT EXTRACTION PHACO AND INTRAOCULAR LENS PLACEMENT (IOC);  Surgeon: Birder Robson, MD;  Location: [facility] ORS;  Service: Ophthalmology;  Laterality: Left;  PACK LOT: [id number] H US:00:32 AP:44 CDE:6.46  . COLONOSCOPY    . COLONOSCOPY WITH PROPOFOL N/A 11/26/2015   Procedure: COLONOSCOPY WITH PROPOFOL;  Surgeon: Manya Silvas, MD;  Location: [facility] ENDOSCOPY;  Service: Endoscopy;  Laterality: N/A;  . EYE SURGERY    . TONSILLECTOMY      Current Outpatient Prescriptions  Medication Sig Dispense Refill  . albuterol (PROVENTIL HFA;VENTOLIN HFA) 108 (90 Base) MCG/ACT inhaler Inhale 2 puffs into the lungs every 6 (six) hours as needed for wheezing or shortness of breath.    . ALPRAZolam (XANAX) 0.25 MG tablet Take 0.25 mg by mouth 2 (two) times daily as needed.    Marland Kitchen amLODipine (NORVASC) 5 MG tablet     . betamethasone valerate ointment (VALISONE) 0.1 % Apply a pea sized amount topically bid x 1-2 weeks as needed 15 g 0  . bisacodyl (DULCOLAX) 10 MG suppository Place 10 mg rectally as needed for moderate constipation.    . cetirizine (ZYRTEC) 10 MG tablet Take 10 mg by mouth daily.    Marland Kitchen  clindamycin (CLEOCIN T) 1 % external solution Apply 1 application topically 2 (two) times daily.    . Cyanocobalamin (VITAMIN B12 PO) Take 1 tablet by mouth daily.    . cyclobenzaprine (FLEXERIL) 5 MG tablet Take 5 mg by mouth 3 (three) times daily as needed for muscle spasms.    Marland Kitchen docusate sodium (COLACE) 100 MG capsule Take 100 mg by mouth 2 (two) times daily.    Marland Kitchen estradiol (ESTRACE) 0.1 MG/GM vaginal cream 1 gram vaginally every night for one week, then twice weekly at bedtime 42.5 g 1  . fenofibrate 160 MG tablet Take 160 mg by mouth daily.    Marland Kitchen FLUoxetine (PROZAC) 40 MG capsule Take 40 mg by mouth daily.    . fluticasone (FLOVENT HFA) 110 MCG/ACT inhaler Inhale 1 puff into the lungs 2 (two) times daily.    Marland Kitchen ipratropium (ATROVENT HFA) 17 MCG/ACT  inhaler Inhale 2 puffs into the lungs every 6 (six) hours.    Marland Kitchen isometheptene-acetaminophen-dichloralphenazone (MIDRIN) 65-100-325 MG capsule Take 1 capsule by mouth 4 (four) times daily as needed for migraine. Maximum 5 capsules in 12 hours for migraine headaches, 8 capsules in 24 hours for tension headaches.    . levothyroxine (SYNTHROID, LEVOTHROID) 100 MCG tablet Take 100 mcg by mouth every morning.    Marland Kitchen lisinopril (PRINIVIL,ZESTRIL) 20 MG tablet Take 1 tablet (20 mg total) by mouth daily. 14 tablet 0  . magnesium oxide (MAG-OX) 400 MG tablet Take 400 mg by mouth daily.    . montelukast (SINGULAIR) 10 MG tablet Take 10 mg by mouth at bedtime.    Marland Kitchen omeprazole (PRILOSEC) 40 MG capsule Take 40 mg by mouth daily.    Marland Kitchen triamcinolone (NASACORT ALLERGY 24HR) 55 MCG/ACT AERO nasal inhaler Place 2 sprays into the nose daily.    Marland Kitchen triamcinolone cream (KENALOG) 0.1 % Apply 1 application topically 2 (two) times daily as needed.     Marland Kitchen VICODIN 5-300 MG TABS Take 1 tablet by mouth 2 (two) times daily as needed.     No current facility-administered medications for this visit.    Facility-Administered Medications Ordered in Other Visits  Medication Dose Route Frequency Provider Last Rate Last Dose  . fentaNYL (SUBLIMAZE) injection 25 mcg  25 mcg Intravenous Q5 min PRN Alvin Critchley, MD      . ondansetron [facility]) injection 4 mg  4 mg Intravenous Once PRN Alvin Critchley, MD         ALLERGIES: Advair diskus [fluticasone-salmeterol]; Codeine; Erythromycin; Morphine and related; Nsaids; and Sulfa antibiotics  Family History  Problem Relation Age of Onset  . Breast cancer Maternal Grandmother   . Stroke Maternal Grandmother   . Bladder Cancer Father   . Prostate cancer Father   . Heart attack Father   . Aortic aneurysm Father   . Congestive Heart Failure Father   . Colon cancer Paternal Grandmother   . Aneurysm Paternal Grandmother   . Stroke Paternal Grandmother   . Congestive Heart Failure Paternal  Grandmother   . Dementia Mother   . Stroke Mother   . Dementia Maternal Grandfather   . Stroke Maternal Grandfather   . Dementia Paternal Grandfather   . Stroke Paternal Grandfather     Social History   Social History  . Marital status: Married    Spouse name: N/A  . Number of children: N/A  . Years of education: N/A   Occupational History  . Not on file.   Social History Main Topics  . Smoking status:  Former Smoker  . Smokeless tobacco: Never Used  . Alcohol use No  . Drug use: No  . Sexual activity: No   Other Topics Concern  . Not on file   Social History Narrative  . No narrative on file    Review of Systems  Constitutional: Negative.   HENT: Negative.   Eyes: Negative.   Respiratory: Negative.   Cardiovascular: Negative.   Gastrointestinal: Negative.   Genitourinary: Negative.   Musculoskeletal: Negative.   Skin: Negative.   Neurological: Negative.   Endo/Heme/Allergies: Negative.   Psychiatric/Behavioral: Negative.     PHYSICAL EXAMINATION:    BP 120/70 (BP Location: Right Arm, Patient Position: Sitting, Cuff Size: Normal)   Pulse 84   Resp 14   Wt 162 lb (73.5 kg)   BMI 31.38 kg/m     General appearance: alert, cooperative and appears stated age  Pelvic: External genitalia:  no lesions              Urethra:  normal appearing urethra with no masses, tenderness or lesions              Bartholins and Skenes: normal                 Vagina: the pessary was removed and cleaned, no vaginal irritation, the pessary was replaced              Cervix: no lesions  Chaperone was present for exam.  ASSESSMENT Pessary check, overall doing well with the pessary Vaginal atrophy, improved with the estrogen cream Slight breast tenderness since using the estrogen cream    PLAN Continue with the pessary She will decrease to 0.5 gm of estrace cream 2 x a week F/U in 3 months, sooner with any concerns   An After Visit Summary was printed and given to the  patient.

## 2017-09-22 ENCOUNTER — Encounter: Payer: Self-pay | Admitting: *Deleted

## 2017-09-22 ENCOUNTER — Ambulatory Visit
Admission: EM | Admit: 2017-09-22 | Discharge: 2017-09-22 | Disposition: A | Discharge status: Home / Self Care | Payer: Medicare Other | Attending: Family Medicine | Admitting: Family Medicine

## 2017-09-22 DIAGNOSIS — E669 Obesity, unspecified: Secondary | ICD-10-CM | POA: Insufficient documentation

## 2017-09-22 DIAGNOSIS — R319 Hematuria, unspecified: Secondary | ICD-10-CM | POA: Diagnosis not present

## 2017-09-22 DIAGNOSIS — G4730 Sleep apnea, unspecified: Secondary | ICD-10-CM | POA: Diagnosis not present

## 2017-09-22 DIAGNOSIS — F419 Anxiety disorder, unspecified: Secondary | ICD-10-CM | POA: Diagnosis not present

## 2017-09-22 DIAGNOSIS — I10 Essential (primary) hypertension: Secondary | ICD-10-CM | POA: Insufficient documentation

## 2017-09-22 DIAGNOSIS — Z79899 Other long term (current) drug therapy: Secondary | ICD-10-CM | POA: Diagnosis not present

## 2017-09-22 DIAGNOSIS — E039 Hypothyroidism, unspecified: Secondary | ICD-10-CM | POA: Diagnosis not present

## 2017-09-22 DIAGNOSIS — Z87891 Personal history of nicotine dependence: Secondary | ICD-10-CM | POA: Diagnosis not present

## 2017-09-22 DIAGNOSIS — J45909 Unspecified asthma, uncomplicated: Secondary | ICD-10-CM | POA: Diagnosis not present

## 2017-09-22 DIAGNOSIS — F329 Major depressive disorder, single episode, unspecified: Secondary | ICD-10-CM | POA: Diagnosis not present

## 2017-09-22 DIAGNOSIS — K219 Gastro-esophageal reflux disease without esophagitis: Secondary | ICD-10-CM | POA: Diagnosis not present

## 2017-09-22 DIAGNOSIS — E785 Hyperlipidemia, unspecified: Secondary | ICD-10-CM | POA: Insufficient documentation

## 2017-09-22 DIAGNOSIS — R300 Dysuria: Secondary | ICD-10-CM | POA: Diagnosis present

## 2017-09-22 DIAGNOSIS — N390 Urinary tract infection, site not specified: Secondary | ICD-10-CM

## 2017-09-22 DIAGNOSIS — M8580 Other specified disorders of bone density and structure, unspecified site: Secondary | ICD-10-CM | POA: Diagnosis not present

## 2017-09-22 DIAGNOSIS — R350 Frequency of micturition: Secondary | ICD-10-CM | POA: Diagnosis present

## 2017-09-22 LAB — URINALYSIS, COMPLETE (UACMP) WITH MICROSCOPIC
BILIRUBIN URINE: NEGATIVE
Glucose, UA: NEGATIVE mg/dL
KETONES UR: NEGATIVE mg/dL
NITRITE: NEGATIVE
PROTEIN: NEGATIVE mg/dL
Specific Gravity, Urine: 1.015 (ref 1.005–1.030)
pH: 7 (ref 5.0–8.0)

## 2017-09-22 MED ORDER — CEPHALEXIN 500 MG PO CAPS: 500.0000 mg | ORAL_CAPSULE | Freq: Two times a day (BID) | ORAL | 0 refills | Status: DC

## 2017-09-22 NOTE — ED Triage Notes (Signed)
Dysuria, urinary freq/urg, x2-3 days.

## 2017-09-22 NOTE — ED Provider Notes (Signed)
[facility]    CSN: [id number] Arrival date & time: 09/22/17  1414     History   Chief Complaint Chief Complaint  Patient presents with  . Dysuria  . Urinary Frequency    HPI Bonnie West is a 66 y.o. female.    Dysuria  Pain quality:  Burning Pain severity:  Mild Onset quality:  Sudden Duration:  2 days Timing:  Constant Progression:  Worsening Chronicity:  New Recent urinary tract infections: no   Relieved by:  None tried Ineffective treatments:  None tried Urinary symptoms: discolored urine and frequent urination   Associated symptoms: no abdominal pain, no fever, no flank pain, no nausea and no vomiting   Risk factors: no hx of pyelonephritis, no hx of urolithiasis, no kidney transplant, not pregnant, no recurrent urinary tract infections, no renal cysts, no renal disease, not sexually active, no sexually transmitted infections and no single kidney   Urinary Frequency  Pertinent negatives include no abdominal pain.    Past Medical History:  Diagnosis Date  . Allergic rhinitis   . Anxiety   . Asthma   . Blood transfusion without reported diagnosis   . Depression   . Diverticulosis   . Dyspnea   . Esophagitis   . GERD (gastroesophageal reflux disease)   . Headache   . Heart murmur   . Hyperlipemia   . Hypertension   . Hypothyroidism   . Obesity   . Osteopenia   . Palpitations   . Pneumothorax   . Sleep apnea     There are no active problems to display for this patient.   Past Surgical History:  Procedure Laterality Date  . BUNIONECTOMY    . CATARACT EXTRACTION W/PHACO Right 10/11/2016   Procedure: CATARACT EXTRACTION PHACO AND INTRAOCULAR LENS PLACEMENT (IOC);  Surgeon: Birder Robson, MD;  Location: [facility] ORS;  Service: Ophthalmology;  Laterality: Right;  Lot# [id number] H [place]: 00:37.7 AP%: 18.1 CDE: 6.80  . CATARACT EXTRACTION W/PHACO Left 11/08/2016   Procedure: CATARACT EXTRACTION PHACO AND INTRAOCULAR LENS PLACEMENT (IOC);   Surgeon: Birder Robson, MD;  Location: [facility] ORS;  Service: Ophthalmology;  Laterality: Left;  PACK LOT: [id number] H US:00:32 AP:44 CDE:6.46  . COLONOSCOPY    . COLONOSCOPY WITH PROPOFOL N/A 11/26/2015   Procedure: COLONOSCOPY WITH PROPOFOL;  Surgeon: Manya Silvas, MD;  Location: [facility] ENDOSCOPY;  Service: Endoscopy;  Laterality: N/A;  . EYE SURGERY    . TONSILLECTOMY      OB History    Gravida Para Term Preterm AB Living   1 1 1     1    SAB TAB Ectopic Multiple Live Births           1       Home Medications    Prior to Admission medications   Medication Sig Start Date End Date Taking? Authorizing Provider  albuterol (PROVENTIL HFA;VENTOLIN HFA) 108 (90 Base) MCG/ACT inhaler Inhale 2 puffs into the lungs every 6 (six) hours as needed for wheezing or shortness of breath.   Yes [provider]  ALPRAZolam (XANAX) 0.25 MG tablet Take 0.25 mg by mouth 2 (two) times daily as needed. 08/27/16  Yes [provider]  amLODipine (NORVASC) 5 MG tablet  05/16/17  Yes [provider]  cetirizine (ZYRTEC) 10 MG tablet Take 10 mg by mouth daily.   Yes [provider]  clindamycin (CLEOCIN T) 1 % external solution Apply 1 application topically 2 (two) times daily.   Yes [provider]  Cyanocobalamin (VITAMIN B12 PO) Take 1 tablet by mouth daily.   Yes [provider]  cyclobenzaprine (FLEXERIL) 5 MG tablet Take 5 mg by mouth 3 (three) times daily as needed for muscle spasms.   Yes [provider]  estradiol (ESTRACE) 0.1 MG/GM vaginal cream 1 gram vaginally every night for one week, then twice weekly at bedtime 06/15/17  Yes Salvadore Dom, MD  fenofibrate 160 MG tablet Take 160 mg by mouth daily.   Yes [provider]  FLUoxetine (PROZAC) 40 MG capsule Take 40 mg by mouth daily.   Yes [provider]  fluticasone (FLOVENT HFA) 110 MCG/ACT inhaler Inhale 1 puff into the lungs 2 (two) times daily.   Yes [provider]  ipratropium (ATROVENT HFA) 17 MCG/ACT inhaler Inhale 2 puffs into the lungs every 6 (six) hours.   Yes [provider]  levothyroxine (SYNTHROID, LEVOTHROID) 100 MCG tablet Take 100 mcg by mouth every morning. 08/04/16  Yes [provider]  lisinopril (PRINIVIL,ZESTRIL) 20 MG tablet Take 1 tablet (20 mg total) by mouth daily. 05/01/17  Yes Norval Gable, MD  magnesium oxide (MAG-OX) 400 MG tablet Take 400 mg by mouth daily.   Yes [provider]  omeprazole (PRILOSEC) 40 MG capsule Take 40 mg by mouth daily.   Yes [provider]  VICODIN 5-300 MG TABS Take 1 tablet by mouth 2 (two) times daily as needed. 08/16/16  Yes [provider]  betamethasone valerate ointment (VALISONE) 0.1 % Apply a pea sized amount topically bid x 1-2 weeks as needed 07/07/17   Salvadore Dom, MD  bisacodyl (DULCOLAX) 10 MG suppository Place 10 mg rectally as needed for moderate constipation.    [provider]  cephALEXin (KEFLEX) 500 MG capsule Take 1 capsule (500 mg total) by mouth 2 (two) times daily. 09/22/17   Norval Gable, MD  docusate sodium (COLACE) 100 MG capsule Take 100 mg by mouth 2 (two) times daily.    [provider]  isometheptene-acetaminophen-dichloralphenazone (MIDRIN) [phone] MG capsule Take 1 capsule by mouth 4 (four) times daily as needed for migraine. Maximum 5 capsules in 12 hours for migraine headaches, 8 capsules in 24 hours for tension headaches.    [provider]  montelukast (SINGULAIR) 10 MG tablet Take 10 mg by mouth at bedtime.    [provider]  triamcinolone (NASACORT ALLERGY 24HR) 55 MCG/ACT AERO nasal inhaler Place 2 sprays into the nose daily.    [provider]  triamcinolone cream (KENALOG) 0.1 % Apply 1 application topically 2 (two) times daily as needed.     [provider]    Family History Family History  Problem Relation Age of Onset  . Breast cancer  Maternal Grandmother   . Stroke Maternal Grandmother   . Bladder Cancer Father   . Prostate cancer Father   . Heart attack Father   . Aortic aneurysm Father   . Congestive Heart Failure Father   . Colon cancer Paternal Grandmother   . Aneurysm Paternal Grandmother   . Stroke Paternal Grandmother   . Congestive Heart Failure Paternal Grandmother   . Dementia Mother   . Stroke Mother   . Dementia Maternal Grandfather   . Stroke Maternal Grandfather   . Dementia Paternal Grandfather   . Stroke Paternal Grandfather     Social History Social History  Substance Use Topics  . Smoking status: Former Research scientist (life sciences)  . Smokeless tobacco: Never Used  . Alcohol use No  Allergies   Advair diskus [fluticasone-salmeterol]; Codeine; Erythromycin; Morphine and related; Nsaids; and Sulfa antibiotics   Review of Systems Review of Systems  Constitutional: Negative for fever.  Gastrointestinal: Negative for abdominal pain, nausea and vomiting.  Genitourinary: Positive for dysuria and frequency. Negative for flank pain.     Physical Exam Triage Vital Signs ED Triage Vitals  Enc Vitals Group     BP 09/22/17 1435 (!) 141/85     Pulse Rate 09/22/17 1435 68     Resp 09/22/17 1435 16     Temp 09/22/17 1435 98.7 F (37.1 C)     Temp Source 09/22/17 1435 Oral     SpO2 09/22/17 1435 99 %     Weight 09/22/17 1437 164 lb (74.4 kg)     Height 09/22/17 1437 5\' 1"  (1.549 m)     Head Circumference --      Peak Flow --      Pain Score --      Pain Loc --      Pain Edu? --      Excl. in [place]? --    No data found.   Updated Vital Signs BP (!) 141/85 (BP Location: Left Arm)   Pulse 68   Temp 98.7 F (37.1 C) (Oral)   Resp 16   Ht 5\' 1"  (1.549 m)   Wt 164 lb (74.4 kg)   SpO2 99%   BMI 30.99 kg/m   Visual Acuity Right Eye Distance:   Left Eye Distance:   Bilateral Distance:    Right Eye Near:   Left Eye Near:    Bilateral Near:     Physical Exam  Constitutional: She appears  well-developed and well-nourished. No distress.  Abdominal: Soft. Bowel sounds are normal. She exhibits no distension and no mass. There is no tenderness. There is no rebound and no guarding.  Skin: She is not diaphoretic.  Nursing note and vitals reviewed.    UC Treatments / Results  Labs (all labs ordered are listed, but only abnormal results are displayed) Labs Reviewed  URINALYSIS, COMPLETE (UACMP) WITH MICROSCOPIC - Abnormal; Notable for the following:       Result Value   APPearance CLOUDY (*)    Hgb urine dipstick MODERATE (*)    Leukocytes, UA LARGE (*)    Squamous Epithelial / LPF 0-5 (*)    Bacteria, UA MANY (*)    All other components within normal limits  URINE CULTURE    EKG  EKG Interpretation None       Radiology No results found.  Procedures Procedures (including critical care time)  Medications Ordered in UC Medications - No data to display   Initial Impression / Assessment and Plan / UC Course  I have reviewed the triage vital signs and the nursing notes.  Pertinent labs & imaging results that were available during my care of the patient were reviewed by me and considered in my medical decision making (see chart for details).      Final Clinical Impressions(s) / UC Diagnoses   Final diagnoses:  Urinary tract infection with hematuria, site unspecified    New Prescriptions Discharge Medication List as of 09/22/2017  3:31 PM    START taking these medications   Details  cephALEXin (KEFLEX) 500 MG capsule Take 1 capsule (500 mg total) by mouth 2 (two) times daily., Starting Fri 09/22/2017, Normal       1. Lab results and diagnosis reviewed with patient 2. rx as per orders above; reviewed  possible side effects, interactions, risks and benefits  3. Recommend supportive treatment with increased water intake 4. Follow-up prn if symptoms worsen or don't improve Controlled Substance Prescriptions [place] Controlled Substance Registry consulted? Not  Applicable   Norval Gable, MD 09/22/17 [phone]

## 2017-09-25 ENCOUNTER — Telehealth: Payer: Self-pay

## 2017-09-25 LAB — URINE CULTURE: Special Requests: NORMAL

## 2017-09-25 NOTE — Telephone Encounter (Signed)
Patient advised of all lab results and verbalized understanding. [facility]

## 2017-10-02 DIAGNOSIS — R05 Cough: Secondary | ICD-10-CM | POA: Diagnosis not present

## 2017-10-02 DIAGNOSIS — J4541 Moderate persistent asthma with (acute) exacerbation: Secondary | ICD-10-CM | POA: Diagnosis not present

## 2017-10-24 ENCOUNTER — Ambulatory Visit (INDEPENDENT_AMBULATORY_CARE_PROVIDER_SITE_OTHER): Payer: Medicare Other | Admitting: Obstetrics and Gynecology

## 2017-10-24 ENCOUNTER — Encounter: Payer: Self-pay | Admitting: Obstetrics and Gynecology

## 2017-10-24 ENCOUNTER — Other Ambulatory Visit: Payer: Self-pay

## 2017-10-24 VITALS — BP 146/82 | HR 62 | Resp 14 | Wt 160.5 lb

## 2017-10-24 DIAGNOSIS — N899 Noninflammatory disorder of vagina, unspecified: Secondary | ICD-10-CM

## 2017-10-24 DIAGNOSIS — N898 Other specified noninflammatory disorders of vagina: Secondary | ICD-10-CM

## 2017-10-24 DIAGNOSIS — Z4689 Encounter for fitting and adjustment of other specified devices: Secondary | ICD-10-CM

## 2017-10-24 DIAGNOSIS — N393 Stress incontinence (female) (male): Secondary | ICD-10-CM | POA: Diagnosis not present

## 2017-10-24 NOTE — Patient Instructions (Signed)
Use 0.5 grams of the estrace cream vaginally every 4-5 days

## 2017-10-24 NOTE — Progress Notes (Signed)
GYNECOLOGY  VISIT   HPI: 66 y.o.   Married  Caucasian  female   G1P1001 with No LMP recorded. Patient is postmenopausal.   here for a pessary check. She was fitted with a #3 ring pessary with support in 7/18. She hasn't been using the estrace cream in the last month, she is worried about the risk, then got sick with asthma.  She feels comfortable with the pessary in. Her back pain is better. Normal BM.  She has some GSI, worse when she is sick. Overall tolerable. She uses poise pads (thin ones). Leaks a small amount most days. Mainly when she coughs.   GYNECOLOGIC HISTORY: No LMP recorded. Patient is postmenopausal. Contraception: post menopausal  Menopausal hormone therapy: estradiol cream         OB History    Gravida Para Term Preterm AB Living   1 1 1     1    SAB TAB Ectopic Multiple Live Births           1         There are no active problems to display for this patient.   Past Medical History:  Diagnosis Date  . Allergic rhinitis   . Anxiety   . Asthma   . Blood transfusion without reported diagnosis   . Depression   . Diverticulosis   . Dyspnea   . Esophagitis   . GERD (gastroesophageal reflux disease)   . Headache   . Heart murmur   . Hyperlipemia   . Hypertension   . Hypothyroidism   . Obesity   . Osteopenia   . Palpitations   . Pneumothorax   . Sleep apnea     Past Surgical History:  Procedure Laterality Date  . BUNIONECTOMY    . CATARACT EXTRACTION W/PHACO Right 10/11/2016   Procedure: CATARACT EXTRACTION PHACO AND INTRAOCULAR LENS PLACEMENT (IOC);  Surgeon: Birder Robson, MD;  Location: [facility] ORS;  Service: Ophthalmology;  Laterality: Right;  Lot# [id number] H [place]: 00:37.7 AP%: 18.1 CDE: 6.80  . CATARACT EXTRACTION W/PHACO Left 11/08/2016   Procedure: CATARACT EXTRACTION PHACO AND INTRAOCULAR LENS PLACEMENT (IOC);  Surgeon: Birder Robson, MD;  Location: [facility] ORS;  Service: Ophthalmology;  Laterality: Left;  PACK LOT:  [id number] H US:00:32 AP:44 CDE:6.46  . COLONOSCOPY    . COLONOSCOPY WITH PROPOFOL N/A 11/26/2015   Procedure: COLONOSCOPY WITH PROPOFOL;  Surgeon: Manya Silvas, MD;  Location: [facility] ENDOSCOPY;  Service: Endoscopy;  Laterality: N/A;  . EYE SURGERY    . TONSILLECTOMY      Current Outpatient Medications  Medication Sig Dispense Refill  . albuterol (PROVENTIL HFA;VENTOLIN HFA) 108 (90 Base) MCG/ACT inhaler Inhale 2 puffs into the lungs every 6 (six) hours as needed for wheezing or shortness of breath.    . ALPRAZolam (XANAX) 0.25 MG tablet Take 0.25 mg by mouth 2 (two) times daily as needed.    Marland Kitchen amLODipine (NORVASC) 5 MG tablet     . betamethasone valerate ointment (VALISONE) 0.1 % Apply a pea sized amount topically bid x 1-2 weeks as needed 15 g 0  . bisacodyl (DULCOLAX) 10 MG suppository Place 10 mg rectally as needed for moderate constipation.    . cetirizine (ZYRTEC) 10 MG tablet Take 10 mg by mouth daily.    . Cholecalciferol (VITAMIN D-1000 MAX ST) 1000 units tablet Take by mouth.    . clindamycin (CLEOCIN T) 1 % external solution Apply 1 application topically 2 (two) times daily.    . Cyanocobalamin (VITAMIN B12 PO) Take  1 tablet by mouth daily.    . cyclobenzaprine (FLEXERIL) 5 MG tablet Take 5 mg by mouth 3 (three) times daily as needed for muscle spasms.    Marland Kitchen docusate sodium (COLACE) 100 MG capsule Take 100 mg by mouth 2 (two) times daily.    Marland Kitchen estradiol (ESTRACE) 0.1 MG/GM vaginal cream 1 gram vaginally every night for one week, then twice weekly at bedtime 42.5 g 1  . fenofibrate 160 MG tablet Take 160 mg by mouth daily.    Marland Kitchen FLUoxetine (PROZAC) 40 MG capsule Take 40 mg by mouth daily.    . fluticasone (FLOVENT HFA) 110 MCG/ACT inhaler Inhale 1 puff into the lungs 2 (two) times daily.    Marland Kitchen ipratropium (ATROVENT HFA) 17 MCG/ACT inhaler Inhale 2 puffs into the lungs every 6 (six) hours.    Marland Kitchen isometheptene-acetaminophen-dichloralphenazone (MIDRIN) 65-100-325 MG capsule Take 1 capsule  by mouth 4 (four) times daily as needed for migraine. Maximum 5 capsules in 12 hours for migraine headaches, 8 capsules in 24 hours for tension headaches.    . levothyroxine (SYNTHROID, LEVOTHROID) 100 MCG tablet Take 100 mcg by mouth every morning.    Marland Kitchen lisinopril (PRINIVIL,ZESTRIL) 20 MG tablet Take 1 tablet (20 mg total) by mouth daily. 14 tablet 0  . magnesium oxide (MAG-OX) 400 MG tablet Take 400 mg by mouth daily.    . montelukast (SINGULAIR) 10 MG tablet Take 10 mg by mouth at bedtime.    Marland Kitchen omeprazole (PRILOSEC) 40 MG capsule Take 40 mg by mouth daily.    . predniSONE (STERAPRED UNI-PAK 21 TAB) 10 MG (21) TBPK tablet     . triamcinolone (NASACORT ALLERGY 24HR) 55 MCG/ACT AERO nasal inhaler Place 2 sprays into the nose daily.    Marland Kitchen triamcinolone cream (KENALOG) 0.1 % Apply 1 application topically 2 (two) times daily as needed.     Marland Kitchen VICODIN 5-300 MG TABS Take 1 tablet by mouth 2 (two) times daily as needed.     No current facility-administered medications for this visit.    Facility-Administered Medications Ordered in Other Visits  Medication Dose Route Frequency Provider Last Rate Last Dose  . fentaNYL (SUBLIMAZE) injection 25 mcg  25 mcg Intravenous Q5 min PRN Alvin Critchley, MD      . ondansetron [facility]) injection 4 mg  4 mg Intravenous Once PRN Alvin Critchley, MD         ALLERGIES: Advair diskus [fluticasone-salmeterol]; Codeine; Erythromycin; Morphine and related; Nsaids; and Sulfa antibiotics  Family History  Problem Relation Age of Onset  . Breast cancer Maternal Grandmother   . Stroke Maternal Grandmother   . Bladder Cancer Father   . Prostate cancer Father   . Heart attack Father   . Aortic aneurysm Father   . Congestive Heart Failure Father   . Colon cancer Paternal Grandmother   . Aneurysm Paternal Grandmother   . Stroke Paternal Grandmother   . Congestive Heart Failure Paternal Grandmother   . Dementia Mother   . Stroke Mother   . Dementia Maternal Grandfather   .  Stroke Maternal Grandfather   . Dementia Paternal Grandfather   . Stroke Paternal Grandfather     Social History   Socioeconomic History  . Marital status: Married    Spouse name: Not on file  . Number of children: Not on file  . Years of education: Not on file  . Highest education level: Not on file  Social Needs  . Financial resource strain: Not on file  . Food  insecurity - worry: Not on file  . Food insecurity - inability: Not on file  . Transportation needs - medical: Not on file  . Transportation needs - non-medical: Not on file  Occupational History  . Not on file  Tobacco Use  . Smoking status: Former Research scientist (life sciences)  . Smokeless tobacco: Never Used  Substance and Sexual Activity  . Alcohol use: No  . Drug use: No  . Sexual activity: No    Partners: Male    Birth control/protection: Post-menopausal  Other Topics Concern  . Not on file  Social History Narrative  . Not on file    Review of Systems  Constitutional: Negative.   HENT: Negative.   Eyes: Negative.   Respiratory: Negative.   Cardiovascular: Negative.   Gastrointestinal: Negative.   Genitourinary: Negative.   Musculoskeletal: Positive for joint pain and myalgias.  Skin: Negative.   Neurological: Negative.   Endo/Heme/Allergies: Negative.   Psychiatric/Behavioral: Negative.     PHYSICAL EXAMINATION:    BP (!) 146/82 (BP Location: Right Arm, Patient Position: Sitting, Cuff Size: Normal)   Pulse 62   Resp 14   Wt 160 lb 8 oz (72.8 kg)   BMI 30.33 kg/m     General appearance: alert, cooperative and appears stated age   Pelvic: External genitalia:  no lesions              Urethra:  normal appearing urethra with no masses, tenderness or lesions              Bartholins and Skenes: normal                 Vagina: the pessary was removed and cleaned, there is an area of erythema and irritation on the right posterior vaginal apex, approximately 1 cm              Cervix: Stenotic, mild erythema  Chaperone  was present for exam.  ASSESSMENT Genital prolapse, well controlled with the pessary Now with mild vaginal irritation, she stopped using the vaginal estrogen GSI, tolerable    PLAN She will restart 0.5 gram of estrace cream vaginally every 4-5 days We discussed her removing the pessary and she states she has tried and can't get it F/U in one month Call with any concerns   An After Visit Summary was printed and given to the patient.

## 2017-10-25 ENCOUNTER — Ambulatory Visit: Payer: Medicare Other | Admitting: Obstetrics and Gynecology

## 2017-10-25 DIAGNOSIS — J4540 Moderate persistent asthma, uncomplicated: Secondary | ICD-10-CM | POA: Diagnosis not present

## 2017-10-25 DIAGNOSIS — I10 Essential (primary) hypertension: Secondary | ICD-10-CM | POA: Diagnosis not present

## 2017-11-06 ENCOUNTER — Other Ambulatory Visit: Payer: Self-pay | Admitting: Internal Medicine

## 2017-11-07 ENCOUNTER — Other Ambulatory Visit: Payer: Self-pay

## 2017-11-07 MED ORDER — MONTELUKAST SODIUM 10 MG PO TABS: 10.0000 mg | ORAL_TABLET | Freq: Every day | ORAL | 3 refills | Status: DC

## 2017-11-20 DIAGNOSIS — Z23 Encounter for immunization: Secondary | ICD-10-CM | POA: Diagnosis not present

## 2017-11-27 ENCOUNTER — Ambulatory Visit (INDEPENDENT_AMBULATORY_CARE_PROVIDER_SITE_OTHER): Payer: Medicare Other | Admitting: Internal Medicine

## 2017-11-27 ENCOUNTER — Encounter: Payer: Self-pay | Admitting: Internal Medicine

## 2017-11-27 VITALS — BP 140/90 | HR 83 | Resp 16 | Ht 61.0 in | Wt 163.0 lb

## 2017-11-27 DIAGNOSIS — J45909 Unspecified asthma, uncomplicated: Secondary | ICD-10-CM

## 2017-11-27 DIAGNOSIS — G43709 Chronic migraine without aura, not intractable, without status migrainosus: Secondary | ICD-10-CM | POA: Diagnosis not present

## 2017-11-27 DIAGNOSIS — J301 Allergic rhinitis due to pollen: Secondary | ICD-10-CM | POA: Diagnosis not present

## 2017-11-27 DIAGNOSIS — G4733 Obstructive sleep apnea (adult) (pediatric): Secondary | ICD-10-CM

## 2017-11-27 NOTE — Progress Notes (Signed)
[facility] [place], [place] [address]  Pulmonary Sleep Medicine  Office Visit Note  Patient Name: Bonnie West DOB: 1951-04-10 MRN [id number]  Date of Service: 11/27/2017     Complaints/HPI:  Patient with multiple medical problems including chronic migraines obstructive sleep apnea-hypopnea syndrome.  She has been doing relatively well.  She usually takes Vicodin for her headaches.  She states that she has been doing well with CPAP also.  No recent issues as far as nasal congestion or upper respiratory infections.  Occasional shortness of breath this still noted.  Current Medication: Outpatient Encounter Medications as of 11/27/2017  Medication Sig  . albuterol (PROVENTIL HFA;VENTOLIN HFA) 108 (90 Base) MCG/ACT inhaler Inhale 2 puffs into the lungs every 6 (six) hours as needed for wheezing or shortness of breath.  . ALPRAZolam (XANAX) 0.25 MG tablet Take 0.25 mg by mouth 2 (two) times daily as needed.  Marland Kitchen amLODipine (NORVASC) 5 MG tablet TAKE 1 TABLET BY MOUTH ONCE DAILY  . betamethasone valerate ointment (VALISONE) 0.1 % Apply a pea sized amount topically bid x 1-2 weeks as needed  . bisacodyl (DULCOLAX) 10 MG suppository Place 10 mg rectally as needed for moderate constipation.  . cetirizine (ZYRTEC) 10 MG tablet Take 10 mg by mouth daily.  . Cholecalciferol (VITAMIN D-1000 MAX ST) 1000 units tablet Take by mouth.  . Cyanocobalamin (VITAMIN B12 PO) Take 1 tablet by mouth daily.  . cyclobenzaprine (FLEXERIL) 5 MG tablet Take 5 mg by mouth 3 (three) times daily as needed for muscle spasms.  Marland Kitchen docusate sodium (COLACE) 100 MG capsule Take 100 mg by mouth 2 (two) times daily.  Marland Kitchen estradiol (ESTRACE) 0.1 MG/GM vaginal cream 1 gram vaginally every night for one week, then twice weekly at bedtime  . fenofibrate 160 MG tablet Take 160 mg by mouth daily.  Marland Kitchen FLUoxetine (PROZAC) 40 MG capsule Take 40 mg by mouth daily.  . fluticasone (FLOVENT HFA) 110 MCG/ACT inhaler  Inhale 1 puff into the lungs 2 (two) times daily.  Marland Kitchen ipratropium (ATROVENT HFA) 17 MCG/ACT inhaler Inhale 2 puffs into the lungs every 6 (six) hours.  Marland Kitchen isometheptene-acetaminophen-dichloralphenazone (MIDRIN) 65-100-325 MG capsule Take 1 capsule by mouth 4 (four) times daily as needed for migraine. Maximum 5 capsules in 12 hours for migraine headaches, 8 capsules in 24 hours for tension headaches.  . levothyroxine (SYNTHROID, LEVOTHROID) 100 MCG tablet Take 100 mcg by mouth every morning.  Marland Kitchen lisinopril (PRINIVIL,ZESTRIL) 20 MG tablet Take 1 tablet (20 mg total) by mouth daily.  . magnesium oxide (MAG-OX) 400 MG tablet Take 400 mg by mouth daily.  . montelukast (SINGULAIR) 10 MG tablet Take 1 tablet (10 mg total) by mouth at bedtime.  Marland Kitchen omeprazole (PRILOSEC) 40 MG capsule Take 40 mg by mouth daily.  Marland Kitchen triamcinolone cream (KENALOG) 0.1 % Apply 1 application topically 2 (two) times daily as needed.   Marland Kitchen VICODIN 5-300 MG TABS Take 1 tablet by mouth 2 (two) times daily as needed.  . clindamycin (CLEOCIN T) 1 % external solution Apply 1 application topically 2 (two) times daily.  . predniSONE (STERAPRED UNI-PAK 21 TAB) 10 MG (21) TBPK tablet   . triamcinolone (NASACORT ALLERGY 24HR) 55 MCG/ACT AERO nasal inhaler Place 2 sprays into the nose daily.   Facility-Administered Encounter Medications as of 11/27/2017  Medication  . fentaNYL (SUBLIMAZE) injection 25 mcg  . ondansetron (ZOFRAN) injection 4 mg    Surgical History: Past Surgical History:  Procedure Laterality Date  .  BUNIONECTOMY    . CATARACT EXTRACTION W/PHACO Right 10/11/2016   Procedure: CATARACT EXTRACTION PHACO AND INTRAOCULAR LENS PLACEMENT (IOC);  Surgeon: Birder Robson, MD;  Location: [facility] ORS;  Service: Ophthalmology;  Laterality: Right;  Lot# [id number] H [place]: 00:37.7 AP%: 18.1 CDE: 6.80  . CATARACT EXTRACTION W/PHACO Left 11/08/2016   Procedure: CATARACT EXTRACTION PHACO AND INTRAOCULAR LENS PLACEMENT (IOC);  Surgeon: Birder Robson, MD;  Location: [facility] ORS;  Service: Ophthalmology;  Laterality: Left;  PACK LOT: [id number] H US:00:32 AP:44 CDE:6.46  . COLONOSCOPY    . COLONOSCOPY WITH PROPOFOL N/A 11/26/2015   Procedure: COLONOSCOPY WITH PROPOFOL;  Surgeon: Manya Silvas, MD;  Location: [facility] ENDOSCOPY;  Service: Endoscopy;  Laterality: N/A;  . EYE SURGERY    . TONSILLECTOMY      Medical History: Past Medical History:  Diagnosis Date  . Allergic rhinitis   . Anxiety   . Asthma   . Blood transfusion without reported diagnosis   . Depression   . Diverticulosis   . Dyspnea   . Esophagitis   . GERD (gastroesophageal reflux disease)   . Headache   . Heart murmur   . Hyperlipemia   . Hypertension   . Hypothyroidism   . Obesity   . Osteopenia   . Palpitations   . Pneumothorax   . Sleep apnea     Family History: Family History  Problem Relation Age of Onset  . Breast cancer Maternal Grandmother   . Stroke Maternal Grandmother   . Bladder Cancer Father   . Prostate cancer Father   . Heart attack Father   . Aortic aneurysm Father   . Congestive Heart Failure Father   . Colon cancer Paternal Grandmother   . Aneurysm Paternal Grandmother   . Stroke Paternal Grandmother   . Congestive Heart Failure Paternal Grandmother   . Dementia Mother   . Stroke Mother   . Dementia Maternal Grandfather   . Stroke Maternal Grandfather   . Dementia Paternal Grandfather   . Stroke Paternal Grandfather     Social History: Social History   Socioeconomic History  . Marital status: Married    Spouse name: Not on file  . Number of children: Not on file  . Years of education: Not on file  . Highest education level: Not on file  Social Needs  . Financial resource strain: Not on file  . Food insecurity - worry: Not on file  . Food insecurity - inability: Not on file  . Transportation needs - medical: Not on file  . Transportation needs - non-medical: Not on file  Occupational History  . Not on file   Tobacco Use  . Smoking status: Former Research scientist (life sciences)  . Smokeless tobacco: Never Used  Substance and Sexual Activity  . Alcohol use: No  . Drug use: No  . Sexual activity: No    Partners: Male    Birth control/protection: Post-menopausal  Other Topics Concern  . Not on file  Social History Narrative  . Not on file     ROS  General: (-) fever, (-) chills, (-) night sweats, (-) weakness, (-) changes in appetite. Skin: (-) rashes, (-) itching,. Eyes: (-) visual changes, (-) redness, (-) itching, (-) double or blurred vision. Nose and Sinuses: (-) nasal stuffiness or itchiness, (-) postnasal drip, (-) nosebleeds, (-) sinus trouble. Mouth and Throat: (-) sore throat, (-) hoarseness. Neck: (-) swollen glands, (-) enlarged thyroid, (-) neck pain. Respiratory: (-) cough, (-) bloody sputum, +shortness of breath Cardiovascular: (-) ankle swelling, (-)  chest pain. Lymphatic: (-) lymph node enlargement, (-) lymph node tenderness. Neurologic: (-) numbness, (-) tingling,(-) dizziness. Psychiatric: (-) anxiety, (-) depression.  Vital Signs: Blood pressure 140/90, pulse 83, resp. rate 16, height 5\' 1"  (1.549 m), weight 163 lb (73.9 kg), SpO2 98 %.  Examination: General Appearance: The patient is well-developed, well-nourished, and in no distress. Skin: Gross inspection of skin demonstrates no evidence of abnormality. Head: Patient's head is normocephalic, no gross deformities. Eyes: no gross deformities noted. ENT: ears appear grossly normal. Nasopharynx appears to be normal. Neck: Supple. No thyromegaly. No LAD. Respiratory: Lungs are clear to auscultation with no adventitious sounds. Cardiovascular: Normal S1 and S2 without murmur or rub. Extremities: No cyanosis. pulses are equal. Neurologic: Alert and oriented. No involuntary movements.  LABS: Recent Results (from the past 2160 hour(s))  Urinalysis, Complete w Microscopic     Status: Abnormal   Collection Time: 09/22/17  2:39 PM   Result Value Ref Range   Color, Urine YELLOW YELLOW   APPearance CLOUDY (A) CLEAR   Specific Gravity, Urine 1.015 1.005 - 1.030   pH 7.0 5.0 - 8.0   Glucose, UA NEGATIVE NEGATIVE mg/dL   Hgb urine dipstick MODERATE (A) NEGATIVE   Bilirubin Urine NEGATIVE NEGATIVE   Ketones, ur NEGATIVE NEGATIVE mg/dL   Protein, ur NEGATIVE NEGATIVE mg/dL   Nitrite NEGATIVE NEGATIVE   Leukocytes, UA LARGE (A) NEGATIVE   Squamous Epithelial / LPF 0-5 (A) NONE SEEN   WBC, UA TOO NUMEROUS TO COUNT 0 - 5 WBC/hpf   RBC / HPF 6-30 0 - 5 RBC/hpf   Bacteria, UA MANY (A) NONE SEEN  Urine culture     Status: Abnormal   Collection Time: 09/22/17  2:39 PM  Result Value Ref Range   Specimen Description URINE, CLEAN CATCH    Special Requests Normal    Culture >=100,000 COLONIES/mL KLEBSIELLA PNEUMONIAE (A)    Report Status 09/25/2017 FINAL    Organism ID, Bacteria KLEBSIELLA PNEUMONIAE (A)       Susceptibility   Klebsiella pneumoniae - MIC*    AMPICILLIN RESISTANT Resistant     CEFAZOLIN <=4 SENSITIVE Sensitive     CEFTRIAXONE <=1 SENSITIVE Sensitive     CIPROFLOXACIN <=0.25 SENSITIVE Sensitive     GENTAMICIN <=1 SENSITIVE Sensitive     IMIPENEM <=0.25 SENSITIVE Sensitive     NITROFURANTOIN 64 INTERMEDIATE Intermediate     TRIMETH/SULFA <=20 SENSITIVE Sensitive     AMPICILLIN/SULBACTAM 4 SENSITIVE Sensitive     PIP/TAZO <=4 SENSITIVE Sensitive     Extended ESBL NEGATIVE Sensitive     * >=100,000 COLONIES/mL KLEBSIELLA PNEUMONIAE    Radiology: No results found.  No results found.  No results found.    Assessment and Plan: There are no active problems to display for this patient.   1. OSA   She will continue with CPAP therapy as prescribed We will continue with supportive care Monitor periodic down mode  2. Migraines  she will need a prescription for Vicodin headaches are better controlled with current management She will receive a medications from her primary care  physician  3. Moderate Asthma  suggested 2 to do a follow-up pulmonary function test it has been over 4 years since her last study  Right now she does want to do it and we will follow up in 6 months at which time she stated she would get a PFT done  4. Allergic Rhinitis  stable at this time with seasonal she will continue with present management  General Counseling: I have discussed the findings of the evaluation and examination with Vaughan Basta.  I have also discussed any further diagnostic evaluation thatmay be needed or ordered today. Anastassia verbalizes understanding of the findings of todays visit. We also reviewed her medications today and discussed drug interactions and side effects including but not limited excessive drowsiness and altered mental states. We also discussed that there is always a risk not just to her but also people around her. she has been encouraged to call the office with any questions or concerns that should arise related to todays visit.    Time spent: 80min  I have personally obtained a history, examined the patient, evaluated laboratory and imaging results, formulated the assessment and plan and placed orders.    Allyne Gee, MD [facility] Pulmonary and Critical Care Sleep medicine

## 2017-11-28 ENCOUNTER — Encounter (INDEPENDENT_AMBULATORY_CARE_PROVIDER_SITE_OTHER): Payer: Self-pay

## 2017-11-28 ENCOUNTER — Ambulatory Visit (INDEPENDENT_AMBULATORY_CARE_PROVIDER_SITE_OTHER): Payer: Medicare Other | Admitting: Obstetrics and Gynecology

## 2017-11-28 ENCOUNTER — Other Ambulatory Visit: Payer: Self-pay

## 2017-11-28 ENCOUNTER — Encounter: Payer: Self-pay | Admitting: Obstetrics and Gynecology

## 2017-11-28 VITALS — BP 138/82 | HR 80 | Resp 16 | Wt 165.0 lb

## 2017-11-28 DIAGNOSIS — Z4689 Encounter for fitting and adjustment of other specified devices: Secondary | ICD-10-CM

## 2017-11-28 NOTE — Progress Notes (Signed)
GYNECOLOGY  VISIT   HPI: 67 y.o.   Married  Caucasian  female   G1P1001 with No LMP recorded. Patient is postmenopausal.   here for follow up pessary. The patient has a #3 ring pessary with support, first fitted in 7/18. When she was seen last month for f/u, she hasn't been using her vaginal estrogen and had vaginal irritation. She is using 1 gram of estrace cream 1-2 x a week. She is not able to take the pessary out. Last used the cream on Friday.   GYNECOLOGIC HISTORY: No LMP recorded. Patient is postmenopausal. Contraception:postmenopause  Menopausal hormone therapy: none         OB History    Gravida Para Term Preterm AB Living   1 1 1     1    SAB TAB Ectopic Multiple Live Births           1         There are no active problems to display for this patient.   Past Medical History:  Diagnosis Date  . Allergic rhinitis   . Anxiety   . Asthma   . Blood transfusion without reported diagnosis   . Depression   . Diverticulosis   . Dyspnea   . Esophagitis   . GERD (gastroesophageal reflux disease)   . Headache   . Heart murmur   . Hyperlipemia   . Hypertension   . Hypothyroidism   . Obesity   . Osteopenia   . Palpitations   . Pneumothorax   . Sleep apnea     Past Surgical History:  Procedure Laterality Date  . BUNIONECTOMY    . CATARACT EXTRACTION W/PHACO Right 10/11/2016   Procedure: CATARACT EXTRACTION PHACO AND INTRAOCULAR LENS PLACEMENT (IOC);  Surgeon: Birder Robson, MD;  Location: [facility] ORS;  Service: Ophthalmology;  Laterality: Right;  Lot# [id number] H [place]: 00:37.7 AP%: 18.1 CDE: 6.80  . CATARACT EXTRACTION W/PHACO Left 11/08/2016   Procedure: CATARACT EXTRACTION PHACO AND INTRAOCULAR LENS PLACEMENT (IOC);  Surgeon: Birder Robson, MD;  Location: [facility] ORS;  Service: Ophthalmology;  Laterality: Left;  PACK LOT: [id number] H US:00:32 AP:44 CDE:6.46  . COLONOSCOPY    . COLONOSCOPY WITH PROPOFOL N/A 11/26/2015   Procedure: COLONOSCOPY WITH PROPOFOL;  Surgeon:  Manya Silvas, MD;  Location: [facility] ENDOSCOPY;  Service: Endoscopy;  Laterality: N/A;  . EYE SURGERY    . TONSILLECTOMY      Current Outpatient Medications  Medication Sig Dispense Refill  . albuterol (PROVENTIL HFA;VENTOLIN HFA) 108 (90 Base) MCG/ACT inhaler Inhale 2 puffs into the lungs every 6 (six) hours as needed for wheezing or shortness of breath.    . ALPRAZolam (XANAX) 0.25 MG tablet Take 0.25 mg by mouth 2 (two) times daily as needed.    Marland Kitchen amLODipine (NORVASC) 5 MG tablet TAKE 1 TABLET BY MOUTH ONCE DAILY 30 tablet 2  . betamethasone valerate ointment (VALISONE) 0.1 % Apply a pea sized amount topically bid x 1-2 weeks as needed 15 g 0  . bisacodyl (DULCOLAX) 10 MG suppository Place 10 mg rectally as needed for moderate constipation.    . cetirizine (ZYRTEC) 10 MG tablet Take 10 mg by mouth daily.    . Cholecalciferol (VITAMIN D-1000 MAX ST) 1000 units tablet Take by mouth.    . clindamycin (CLEOCIN T) 1 % external solution Apply 1 application topically 2 (two) times daily.    . Cyanocobalamin (VITAMIN B12 PO) Take 1 tablet by mouth daily.    . cyclobenzaprine (FLEXERIL) 5 MG tablet  Take 5 mg by mouth 3 (three) times daily as needed for muscle spasms.    Marland Kitchen docusate sodium (COLACE) 100 MG capsule Take 100 mg by mouth 2 (two) times daily.    Marland Kitchen estradiol (ESTRACE) 0.1 MG/GM vaginal cream 1 gram vaginally every night for one week, then twice weekly at bedtime 42.5 g 1  . fenofibrate 160 MG tablet Take 160 mg by mouth daily.    Marland Kitchen FLUoxetine (PROZAC) 40 MG capsule Take 40 mg by mouth daily.    . fluticasone (FLOVENT HFA) 110 MCG/ACT inhaler Inhale 1 puff into the lungs 2 (two) times daily.    Marland Kitchen ipratropium (ATROVENT HFA) 17 MCG/ACT inhaler Inhale 2 puffs into the lungs every 6 (six) hours.    Marland Kitchen isometheptene-acetaminophen-dichloralphenazone (MIDRIN) 65-100-325 MG capsule Take 1 capsule by mouth 4 (four) times daily as needed for migraine. Maximum 5 capsules in 12 hours for migraine  headaches, 8 capsules in 24 hours for tension headaches.    . levothyroxine (SYNTHROID, LEVOTHROID) 100 MCG tablet Take 100 mcg by mouth every morning.    Marland Kitchen lisinopril (PRINIVIL,ZESTRIL) 20 MG tablet Take 1 tablet (20 mg total) by mouth daily. 14 tablet 0  . magnesium oxide (MAG-OX) 400 MG tablet Take 400 mg by mouth daily.    . montelukast (SINGULAIR) 10 MG tablet Take 1 tablet (10 mg total) by mouth at bedtime. 90 tablet 3  . omeprazole (PRILOSEC) 40 MG capsule Take 40 mg by mouth daily.    Marland Kitchen triamcinolone (NASACORT ALLERGY 24HR) 55 MCG/ACT AERO nasal inhaler Place 2 sprays into the nose daily.    Marland Kitchen triamcinolone cream (KENALOG) 0.1 % Apply 1 application topically 2 (two) times daily as needed.     Marland Kitchen VICODIN 5-300 MG TABS Take 1 tablet by mouth 2 (two) times daily as needed.     No current facility-administered medications for this visit.    Facility-Administered Medications Ordered in Other Visits  Medication Dose Route Frequency Provider Last Rate Last Dose  . fentaNYL (SUBLIMAZE) injection 25 mcg  25 mcg Intravenous Q5 min PRN Alvin Critchley, MD      . ondansetron [facility]) injection 4 mg  4 mg Intravenous Once PRN Alvin Critchley, MD         ALLERGIES: Advair diskus [fluticasone-salmeterol]; Codeine; Erythromycin; Morphine and related; Nsaids; and Sulfa antibiotics  Family History  Problem Relation Age of Onset  . Breast cancer Maternal Grandmother   . Stroke Maternal Grandmother   . Bladder Cancer Father   . Prostate cancer Father   . Heart attack Father   . Aortic aneurysm Father   . Congestive Heart Failure Father   . Colon cancer Paternal Grandmother   . Aneurysm Paternal Grandmother   . Stroke Paternal Grandmother   . Congestive Heart Failure Paternal Grandmother   . Dementia Mother   . Stroke Mother   . Dementia Maternal Grandfather   . Stroke Maternal Grandfather   . Dementia Paternal Grandfather   . Stroke Paternal Grandfather     Social History   Socioeconomic  History  . Marital status: Married    Spouse name: Not on file  . Number of children: Not on file  . Years of education: Not on file  . Highest education level: Not on file  Social Needs  . Financial resource strain: Not on file  . Food insecurity - worry: Not on file  . Food insecurity - inability: Not on file  . Transportation needs - medical: Not on file  .  Transportation needs - non-medical: Not on file  Occupational History  . Not on file  Tobacco Use  . Smoking status: Former Research scientist (life sciences)  . Smokeless tobacco: Never Used  Substance and Sexual Activity  . Alcohol use: No  . Drug use: No  . Sexual activity: No    Partners: Male    Birth control/protection: Post-menopausal  Other Topics Concern  . Not on file  Social History Narrative  . Not on file    Review of Systems  Constitutional: Negative.   HENT: Negative.   Eyes: Negative.   Respiratory: Negative.   Cardiovascular: Negative.   Gastrointestinal: Negative.   Genitourinary: Negative.   Musculoskeletal: Negative.   Skin: Negative.   Neurological: Negative.   Endo/Heme/Allergies: Negative.   Psychiatric/Behavioral: Negative.     PHYSICAL EXAMINATION:    BP 138/82 (BP Location: Right Arm, Patient Position: Sitting, Cuff Size: Normal)   Pulse 80   Resp 16   Wt 165 lb (74.8 kg)   BMI 31.18 kg/m     General appearance: alert, cooperative and appears stated age  Pelvic: External genitalia:  no lesions              Urethra:  normal appearing urethra with no masses, tenderness or lesions              Bartholins and Skenes: normal                 Vagina: the pessary was removed and cleaned. No vaginal irritation or erythema noted. 1 gram of estrace cream placed vaginally. The pessary was replaced.               Cervix: no lesions  Chaperone was present for exam.  ASSESSMENT Pessary maintenance, doing well. Vaginal erythema has resolved with use of estrogen cream    PLAN F/U in 3 months Continue with the  estrogen cream Call with any concerns   An After Visit Summary was printed and given to the patient.

## 2017-12-20 ENCOUNTER — Other Ambulatory Visit: Payer: Self-pay | Admitting: Nurse Practitioner

## 2017-12-20 DIAGNOSIS — F411 Generalized anxiety disorder: Secondary | ICD-10-CM

## 2017-12-20 MED ORDER — ALPRAZOLAM 0.25 MG PO TABS: 0.2500 mg | ORAL_TABLET | Freq: Two times a day (BID) | ORAL | 2 refills | Status: DC | PRN

## 2017-12-20 NOTE — Progress Notes (Signed)
Renewed rx for alprazolam 0.25mg  twice daily as needed. Sent rx to [facility].

## 2017-12-27 ENCOUNTER — Other Ambulatory Visit: Payer: Self-pay

## 2017-12-27 ENCOUNTER — Telehealth: Payer: Self-pay

## 2017-12-27 MED ORDER — AZITHROMYCIN 250 MG PO TABS: ORAL_TABLET | ORAL | 0 refills | Status: DC

## 2017-12-27 MED ORDER — PREDNISONE 10 MG (21) PO TBPK: ORAL_TABLET | Freq: Every day | ORAL | 0 refills | Status: DC

## 2017-12-27 NOTE — Telephone Encounter (Signed)
Called pt to advised we sent a pred dose and abx to pharmacy.  dbs

## 2017-12-31 ENCOUNTER — Other Ambulatory Visit: Payer: Self-pay

## 2017-12-31 ENCOUNTER — Emergency Department: Payer: Medicare Other

## 2017-12-31 ENCOUNTER — Ambulatory Visit (INDEPENDENT_AMBULATORY_CARE_PROVIDER_SITE_OTHER)
Admission: EM | Admit: 2017-12-31 | Discharge: 2017-12-31 | Disposition: A | Discharge status: Home / Self Care | Payer: Medicare Other | Source: Home / Self Care | Attending: Family Medicine | Admitting: Family Medicine

## 2017-12-31 ENCOUNTER — Inpatient Hospital Stay
Admission: EM | Admit: 2017-12-31 | Discharge: 2018-01-09 | DRG: 189 | Disposition: A | Discharge status: Home / Self Care | Payer: Medicare Other | Attending: Internal Medicine | Admitting: Internal Medicine

## 2017-12-31 DIAGNOSIS — R05 Cough: Secondary | ICD-10-CM | POA: Diagnosis not present

## 2017-12-31 DIAGNOSIS — M8580 Other specified disorders of bone density and structure, unspecified site: Secondary | ICD-10-CM | POA: Diagnosis present

## 2017-12-31 DIAGNOSIS — K5790 Diverticulosis of intestine, part unspecified, without perforation or abscess without bleeding: Secondary | ICD-10-CM | POA: Diagnosis present

## 2017-12-31 DIAGNOSIS — Z803 Family history of malignant neoplasm of breast: Secondary | ICD-10-CM

## 2017-12-31 DIAGNOSIS — Z820 Family history of epilepsy and other diseases of the nervous system: Secondary | ICD-10-CM | POA: Diagnosis not present

## 2017-12-31 DIAGNOSIS — J101 Influenza due to other identified influenza virus with other respiratory manifestations: Secondary | ICD-10-CM | POA: Diagnosis present

## 2017-12-31 DIAGNOSIS — Y92231 Patient bathroom in hospital as the place of occurrence of the external cause: Secondary | ICD-10-CM | POA: Diagnosis not present

## 2017-12-31 DIAGNOSIS — R062 Wheezing: Secondary | ICD-10-CM

## 2017-12-31 DIAGNOSIS — F411 Generalized anxiety disorder: Secondary | ICD-10-CM

## 2017-12-31 DIAGNOSIS — R52 Pain, unspecified: Secondary | ICD-10-CM

## 2017-12-31 DIAGNOSIS — J441 Chronic obstructive pulmonary disease with (acute) exacerbation: Secondary | ICD-10-CM | POA: Diagnosis present

## 2017-12-31 DIAGNOSIS — R0602 Shortness of breath: Secondary | ICD-10-CM

## 2017-12-31 DIAGNOSIS — R011 Cardiac murmur, unspecified: Secondary | ICD-10-CM | POA: Diagnosis present

## 2017-12-31 DIAGNOSIS — Z6830 Body mass index (BMI) 30.0-30.9, adult: Secondary | ICD-10-CM

## 2017-12-31 DIAGNOSIS — Z8249 Family history of ischemic heart disease and other diseases of the circulatory system: Secondary | ICD-10-CM

## 2017-12-31 DIAGNOSIS — I10 Essential (primary) hypertension: Secondary | ICD-10-CM | POA: Diagnosis present

## 2017-12-31 DIAGNOSIS — J111 Influenza due to unidentified influenza virus with other respiratory manifestations: Secondary | ICD-10-CM

## 2017-12-31 DIAGNOSIS — Z8052 Family history of malignant neoplasm of bladder: Secondary | ICD-10-CM

## 2017-12-31 DIAGNOSIS — J45901 Unspecified asthma with (acute) exacerbation: Secondary | ICD-10-CM

## 2017-12-31 DIAGNOSIS — Z961 Presence of intraocular lens: Secondary | ICD-10-CM | POA: Diagnosis present

## 2017-12-31 DIAGNOSIS — Z7951 Long term (current) use of inhaled steroids: Secondary | ICD-10-CM

## 2017-12-31 DIAGNOSIS — J9601 Acute respiratory failure with hypoxia: Principal | ICD-10-CM | POA: Diagnosis present

## 2017-12-31 DIAGNOSIS — F419 Anxiety disorder, unspecified: Secondary | ICD-10-CM | POA: Diagnosis present

## 2017-12-31 DIAGNOSIS — Z886 Allergy status to analgesic agent status: Secondary | ICD-10-CM

## 2017-12-31 DIAGNOSIS — Z8042 Family history of malignant neoplasm of prostate: Secondary | ICD-10-CM

## 2017-12-31 DIAGNOSIS — E785 Hyperlipidemia, unspecified: Secondary | ICD-10-CM | POA: Diagnosis present

## 2017-12-31 DIAGNOSIS — R69 Illness, unspecified: Secondary | ICD-10-CM

## 2017-12-31 DIAGNOSIS — Z885 Allergy status to narcotic agent status: Secondary | ICD-10-CM

## 2017-12-31 DIAGNOSIS — E876 Hypokalemia: Secondary | ICD-10-CM | POA: Diagnosis present

## 2017-12-31 DIAGNOSIS — Z79890 Hormone replacement therapy (postmenopausal): Secondary | ICD-10-CM | POA: Diagnosis not present

## 2017-12-31 DIAGNOSIS — J309 Allergic rhinitis, unspecified: Secondary | ICD-10-CM | POA: Diagnosis present

## 2017-12-31 DIAGNOSIS — Z823 Family history of stroke: Secondary | ICD-10-CM | POA: Diagnosis not present

## 2017-12-31 DIAGNOSIS — E669 Obesity, unspecified: Secondary | ICD-10-CM | POA: Diagnosis present

## 2017-12-31 DIAGNOSIS — W010XXA Fall on same level from slipping, tripping and stumbling without subsequent striking against object, initial encounter: Secondary | ICD-10-CM | POA: Diagnosis not present

## 2017-12-31 DIAGNOSIS — Z9842 Cataract extraction status, left eye: Secondary | ICD-10-CM

## 2017-12-31 DIAGNOSIS — S6991XA Unspecified injury of right wrist, hand and finger(s), initial encounter: Secondary | ICD-10-CM | POA: Diagnosis not present

## 2017-12-31 DIAGNOSIS — M25511 Pain in right shoulder: Secondary | ICD-10-CM | POA: Diagnosis not present

## 2017-12-31 DIAGNOSIS — E039 Hypothyroidism, unspecified: Secondary | ICD-10-CM | POA: Diagnosis present

## 2017-12-31 DIAGNOSIS — K219 Gastro-esophageal reflux disease without esophagitis: Secondary | ICD-10-CM | POA: Diagnosis present

## 2017-12-31 DIAGNOSIS — Z9841 Cataract extraction status, right eye: Secondary | ICD-10-CM | POA: Diagnosis not present

## 2017-12-31 DIAGNOSIS — Z800 Family history of malignant neoplasm of digestive organs: Secondary | ICD-10-CM

## 2017-12-31 DIAGNOSIS — Z87891 Personal history of nicotine dependence: Secondary | ICD-10-CM

## 2017-12-31 DIAGNOSIS — R059 Cough, unspecified: Secondary | ICD-10-CM

## 2017-12-31 DIAGNOSIS — S4991XA Unspecified injury of right shoulder and upper arm, initial encounter: Secondary | ICD-10-CM | POA: Diagnosis not present

## 2017-12-31 DIAGNOSIS — M79644 Pain in right finger(s): Secondary | ICD-10-CM | POA: Diagnosis not present

## 2017-12-31 DIAGNOSIS — F329 Major depressive disorder, single episode, unspecified: Secondary | ICD-10-CM | POA: Diagnosis present

## 2017-12-31 DIAGNOSIS — Z882 Allergy status to sulfonamides status: Secondary | ICD-10-CM

## 2017-12-31 DIAGNOSIS — M79641 Pain in right hand: Secondary | ICD-10-CM | POA: Diagnosis not present

## 2017-12-31 DIAGNOSIS — Z881 Allergy status to other antibiotic agents status: Secondary | ICD-10-CM

## 2017-12-31 LAB — BASIC METABOLIC PANEL
ANION GAP: 15 (ref 5–15)
BUN: 16 mg/dL (ref 6–20)
CALCIUM: 10.1 mg/dL (ref 8.9–10.3)
CO2: 22 mmol/L (ref 22–32)
Chloride: 104 mmol/L (ref 101–111)
Creatinine, Ser: 0.84 mg/dL (ref 0.44–1.00)
GFR calc Af Amer: >60 mL/min (ref >60)
GFR calc non Af Amer: >60 mL/min (ref >60)
GLUCOSE: 132 mg/dL — AB (ref 65–99)
Potassium: 3.6 mmol/L (ref 3.5–5.1)
SODIUM: 141 mmol/L (ref 135–145)

## 2017-12-31 LAB — CBC
HCT: 42.4 % (ref 35.0–47.0)
Hemoglobin: 14.5 g/dL (ref 12.0–16.0)
MCH: 31.6 pg (ref 26.0–34.0)
MCHC: 34.3 g/dL (ref 32.0–36.0)
MCV: 92.3 fL (ref 80.0–100.0)
PLATELETS: 295 10*3/uL (ref 150–440)
RBC: 4.6 MIL/uL (ref 3.80–5.20)
RDW: 12.8 % (ref 11.5–14.5)
WBC: 5.9 10*3/uL (ref 3.6–11.0)

## 2017-12-31 LAB — MAGNESIUM: MAGNESIUM: 2 mg/dL (ref 1.7–2.4)

## 2017-12-31 LAB — INFLUENZA PANEL BY PCR (TYPE A & B)
INFLAPCR: POSITIVE — AB
Influenza B By PCR: NEGATIVE

## 2017-12-31 MED ORDER — SODIUM CHLORIDE 0.9 % IV SOLN: 250.0000 mL | INTRAVENOUS | Status: DC | PRN

## 2017-12-31 MED ORDER — METHYLPREDNISOLONE SODIUM SUCC 125 MG IJ SOLR
60.0000 mg | Freq: Once | INTRAMUSCULAR | Status: AC
  Administered 2017-12-31: 60 mg via INTRAVENOUS
  Filled 2017-12-31: qty 2

## 2017-12-31 MED ORDER — ACETAMINOPHEN 650 MG RE SUPP: 650.0000 mg | Freq: Four times a day (QID) | RECTAL | Status: DC | PRN

## 2017-12-31 MED ORDER — SENNOSIDES-DOCUSATE SODIUM 8.6-50 MG PO TABS
1.0000 | ORAL_TABLET | Freq: Every evening | ORAL | Status: DC | PRN
  Administered 2018-01-04 – 2018-01-05 (×2): 1 via ORAL
  Filled 2017-12-31 (×2): qty 1

## 2017-12-31 MED ORDER — GUAIFENESIN 100 MG/5ML PO SOLN
5.0000 mL | ORAL | Status: DC | PRN
  Administered 2017-12-31 – 2018-01-01 (×5): 100 mg via ORAL
  Administered 2018-01-02 (×2): 5 mL via ORAL
  Administered 2018-01-03 – 2018-01-08 (×19): 100 mg via ORAL
  Filled 2017-12-31 (×2): qty 10
  Filled 2017-12-31 (×2): qty 5
  Filled 2017-12-31 (×4): qty 10
  Filled 2017-12-31: qty 5
  Filled 2017-12-31: qty 10
  Filled 2017-12-31: qty 5
  Filled 2017-12-31: qty 10
  Filled 2017-12-31: qty 5
  Filled 2017-12-31: qty 10
  Filled 2017-12-31 (×2): qty 5
  Filled 2017-12-31 (×2): qty 10
  Filled 2017-12-31: qty 5
  Filled 2017-12-31: qty 10
  Filled 2017-12-31: qty 5
  Filled 2017-12-31 (×4): qty 10
  Filled 2017-12-31: qty 5
  Filled 2017-12-31: qty 10
  Filled 2017-12-31 (×2): qty 5
  Filled 2017-12-31: qty 10

## 2017-12-31 MED ORDER — IPRATROPIUM-ALBUTEROL 0.5-2.5 (3) MG/3ML IN SOLN
3.0000 mL | Freq: Once | RESPIRATORY_TRACT | Status: AC
  Administered 2017-12-31: 3 mL via RESPIRATORY_TRACT
  Filled 2017-12-31: qty 3

## 2017-12-31 MED ORDER — MAGNESIUM OXIDE 400 (241.3 MG) MG PO TABS
400.0000 mg | ORAL_TABLET | Freq: Every day | ORAL | Status: DC
  Administered 2017-12-31 – 2018-01-08 (×9): 400 mg via ORAL
  Filled 2017-12-31 (×9): qty 1

## 2017-12-31 MED ORDER — AMLODIPINE BESYLATE 5 MG PO TABS
5.0000 mg | ORAL_TABLET | Freq: Every day | ORAL | Status: DC
  Administered 2018-01-01 – 2018-01-09 (×9): 5 mg via ORAL
  Filled 2017-12-31 (×10): qty 1

## 2017-12-31 MED ORDER — METHYLPREDNISOLONE SODIUM SUCC 40 MG IJ SOLR
40.0000 mg | Freq: Four times a day (QID) | INTRAMUSCULAR | Status: DC
  Administered 2017-12-31 – 2018-01-09 (×35): 40 mg via INTRAVENOUS
  Filled 2017-12-31 (×35): qty 1

## 2017-12-31 MED ORDER — DOCUSATE SODIUM 100 MG PO CAPS
100.0000 mg | ORAL_CAPSULE | Freq: Two times a day (BID) | ORAL | Status: DC
  Administered 2017-12-31 – 2018-01-09 (×18): 100 mg via ORAL
  Filled 2017-12-31 (×18): qty 1

## 2017-12-31 MED ORDER — ALBUTEROL SULFATE (2.5 MG/3ML) 0.083% IN NEBU
5.0000 mg | INHALATION_SOLUTION | Freq: Once | RESPIRATORY_TRACT | Status: AC
  Administered 2017-12-31: 5 mg via RESPIRATORY_TRACT
  Filled 2017-12-31: qty 6

## 2017-12-31 MED ORDER — LISINOPRIL 20 MG PO TABS
20.0000 mg | ORAL_TABLET | Freq: Every day | ORAL | Status: DC
  Administered 2017-12-31 – 2018-01-08 (×9): 20 mg via ORAL
  Filled 2017-12-31 (×9): qty 1

## 2017-12-31 MED ORDER — OSELTAMIVIR PHOSPHATE 75 MG PO CAPS
75.0000 mg | ORAL_CAPSULE | Freq: Two times a day (BID) | ORAL | Status: DC
Start: 2017-12-31 — End: 2018-01-05
  Administered 2017-12-31 – 2018-01-05 (×10): 75 mg via ORAL
  Filled 2017-12-31 (×11): qty 1

## 2017-12-31 MED ORDER — ENOXAPARIN SODIUM 40 MG/0.4ML ~~LOC~~ SOLN
40.0000 mg | SUBCUTANEOUS | Status: DC
  Administered 2017-12-31 – 2018-01-08 (×9): 40 mg via SUBCUTANEOUS
  Filled 2017-12-31 (×9): qty 0.4

## 2017-12-31 MED ORDER — BISACODYL 5 MG PO TBEC: 5.0000 mg | DELAYED_RELEASE_TABLET | Freq: Every day | ORAL | Status: DC | PRN

## 2017-12-31 MED ORDER — FENOFIBRATE 160 MG PO TABS
160.0000 mg | ORAL_TABLET | Freq: Every day | ORAL | Status: DC
  Administered 2018-01-01 – 2018-01-09 (×9): 160 mg via ORAL
  Filled 2017-12-31 (×9): qty 1

## 2017-12-31 MED ORDER — ALBUTEROL SULFATE (2.5 MG/3ML) 0.083% IN NEBU
2.5000 mg | INHALATION_SOLUTION | RESPIRATORY_TRACT | Status: DC | PRN
  Administered 2018-01-01 (×3): 2.5 mg via RESPIRATORY_TRACT
  Filled 2017-12-31 (×3): qty 3

## 2017-12-31 MED ORDER — CYCLOBENZAPRINE HCL 10 MG PO TABS
5.0000 mg | ORAL_TABLET | Freq: Three times a day (TID) | ORAL | Status: DC | PRN
  Administered 2018-01-05 – 2018-01-09 (×5): 5 mg via ORAL
  Filled 2017-12-31 (×5): qty 1

## 2017-12-31 MED ORDER — SODIUM CHLORIDE 0.9% FLUSH: 3.0000 mL | INTRAVENOUS | Status: DC | PRN

## 2017-12-31 MED ORDER — LORATADINE 10 MG PO TABS
10.0000 mg | ORAL_TABLET | Freq: Every day | ORAL | Status: DC
  Administered 2018-01-01 – 2018-01-09 (×9): 10 mg via ORAL
  Filled 2017-12-31 (×9): qty 1

## 2017-12-31 MED ORDER — LEVOTHYROXINE SODIUM 100 MCG PO TABS
100.0000 ug | ORAL_TABLET | ORAL | Status: DC
  Administered 2018-01-01 – 2018-01-09 (×9): 100 ug via ORAL
  Filled 2017-12-31 (×9): qty 1

## 2017-12-31 MED ORDER — PANTOPRAZOLE SODIUM 40 MG PO TBEC
40.0000 mg | DELAYED_RELEASE_TABLET | Freq: Every day | ORAL | Status: DC
  Administered 2018-01-01 – 2018-01-09 (×9): 40 mg via ORAL
  Filled 2017-12-31 (×9): qty 1

## 2017-12-31 MED ORDER — SODIUM CHLORIDE 0.9% FLUSH
3.0000 mL | Freq: Two times a day (BID) | INTRAVENOUS | Status: DC
  Administered 2017-12-31 – 2018-01-09 (×18): 3 mL via INTRAVENOUS

## 2017-12-31 MED ORDER — FLUOXETINE HCL 20 MG PO CAPS
40.0000 mg | ORAL_CAPSULE | Freq: Every day | ORAL | Status: DC
  Administered 2018-01-01 – 2018-01-09 (×9): 40 mg via ORAL
  Filled 2017-12-31 (×9): qty 2

## 2017-12-31 MED ORDER — IPRATROPIUM-ALBUTEROL 0.5-2.5 (3) MG/3ML IN SOLN
3.0000 mL | Freq: Four times a day (QID) | RESPIRATORY_TRACT | Status: DC
  Administered 2017-12-31: 3 mL via RESPIRATORY_TRACT

## 2017-12-31 MED ORDER — ACETAMINOPHEN 325 MG PO TABS
650.0000 mg | ORAL_TABLET | Freq: Four times a day (QID) | ORAL | Status: DC | PRN
  Administered 2017-12-31 – 2018-01-08 (×18): 650 mg via ORAL
  Filled 2017-12-31 (×18): qty 2

## 2017-12-31 MED ORDER — MONTELUKAST SODIUM 10 MG PO TABS
10.0000 mg | ORAL_TABLET | Freq: Every day | ORAL | Status: DC
  Administered 2018-01-02 – 2018-01-09 (×8): 10 mg via ORAL
  Filled 2017-12-31 (×9): qty 1

## 2017-12-31 MED ORDER — ALPRAZOLAM 0.25 MG PO TABS
0.2500 mg | ORAL_TABLET | Freq: Two times a day (BID) | ORAL | Status: DC | PRN
  Administered 2017-12-31 – 2018-01-02 (×4): 0.25 mg via ORAL
  Filled 2017-12-31 (×4): qty 1

## 2017-12-31 NOTE — ED Triage Notes (Signed)
Pt presents via POV, sent for Urgent care in [place]. C/o SOB and body aches. Pt has hx of asthma. Pt given breathing treatment at UC. Pt reports taking prednisone and z pack starting Thursday and finished abx and has 1 prednisone remaining.

## 2017-12-31 NOTE — ED Provider Notes (Signed)
[facility] ____________________________________________  Time seen: Approximately 430PM  I have reviewed the triage vital signs and the nursing notes.   HISTORY  Chief Complaint Shortness of Breath   HPI Bonnie West is a 67 y.o. female history of asthma presenting for evaluation of cough, congestion, wheezing and shortness of breath that started on this past Tuesday.  Reports her husband recently sick with similar.  States she started out with cold-like symptoms with accompanying intermittent subjective fevers that led into her asthma flaring up.  Reports that she has had continued wheezing.  Has been trying her albuterol and DuoNeb treatments at home.  Was able to call her doctor and they called in a prescription of prednisone and a Z-Pak this past Friday in which she started, without much change.  Patient states that she has been monitoring her oxygenation at home getting ranges of 90-93.  Reports continues shortness of breath with wheezing.  States some soreness from coughing, denies any other chest pain.  Reports feels very tired and run down. Denies recent sickness. Denies recent antibiotic use.  Denies recent asthma exacerbation.  Lavera Guise, MD: PCP   Past Medical History:  Diagnosis Date  . Allergic rhinitis   . Anxiety   . Asthma   . Blood transfusion without reported diagnosis   . Depression   . Diverticulosis   . Dyspnea   . Esophagitis   . GERD (gastroesophageal reflux disease)   . Headache   . Heart murmur   . Hyperlipemia   . Hypertension   . Hypothyroidism   . Obesity   . Osteopenia   . Palpitations   . Pneumothorax   . Sleep apnea     There are no active problems to display for this patient.   Past Surgical History:  Procedure Laterality Date  . BUNIONECTOMY    . CATARACT EXTRACTION W/PHACO Right 10/11/2016   Procedure: CATARACT EXTRACTION PHACO AND INTRAOCULAR LENS PLACEMENT (IOC);  Surgeon: Birder Robson, MD;  Location: [facility]  ORS;  Service: Ophthalmology;  Laterality: Right;  Lot# [id number] H [place]: 00:37.7 AP%: 18.1 CDE: 6.80  . CATARACT EXTRACTION W/PHACO Left 11/08/2016   Procedure: CATARACT EXTRACTION PHACO AND INTRAOCULAR LENS PLACEMENT (IOC);  Surgeon: Birder Robson, MD;  Location: [facility] ORS;  Service: Ophthalmology;  Laterality: Left;  PACK LOT: [id number] H US:00:32 AP:44 CDE:6.46  . COLONOSCOPY    . COLONOSCOPY WITH PROPOFOL N/A 11/26/2015   Procedure: COLONOSCOPY WITH PROPOFOL;  Surgeon: Manya Silvas, MD;  Location: [facility] ENDOSCOPY;  Service: Endoscopy;  Laterality: N/A;  . EYE SURGERY    . TONSILLECTOMY       No current facility-administered medications for this encounter.   Current Outpatient Medications:  .  albuterol (PROVENTIL HFA;VENTOLIN HFA) 108 (90 Base) MCG/ACT inhaler, Inhale 2 puffs into the lungs every 6 (six) hours as needed for wheezing or shortness of breath., Disp: , Rfl:  .  ALPRAZolam (XANAX) 0.25 MG tablet, Take 1 tablet (0.25 mg total) by mouth 2 (two) times daily as needed., Disp: 60 tablet, Rfl: 2 .  amLODipine (NORVASC) 5 MG tablet, TAKE 1 TABLET BY MOUTH ONCE DAILY, Disp: 30 tablet, Rfl: 2 .  betamethasone valerate ointment (VALISONE) 0.1 %, Apply a pea sized amount topically bid x 1-2 weeks as needed, Disp: 15 g, Rfl: 0 .  bisacodyl (DULCOLAX) 10 MG suppository, Place 10 mg rectally as needed for moderate constipation., Disp: , Rfl:  .  cetirizine (ZYRTEC) 10 MG tablet, Take 10 mg by mouth daily., Disp: ,  Rfl:  .  Cholecalciferol (VITAMIN D-1000 MAX ST) 1000 units tablet, Take by mouth., Disp: , Rfl:  .  clindamycin (CLEOCIN T) 1 % external solution, Apply 1 application topically 2 (two) times daily., Disp: , Rfl:  .  Cyanocobalamin (VITAMIN B12 PO), Take 1 tablet by mouth daily., Disp: , Rfl:  .  cyclobenzaprine (FLEXERIL) 5 MG tablet, Take 5 mg by mouth 3 (three) times daily as needed for muscle spasms., Disp: , Rfl:  .  docusate sodium (COLACE) 100 MG capsule, Take 100 mg by  mouth 2 (two) times daily., Disp: , Rfl:  .  estradiol (ESTRACE) 0.1 MG/GM vaginal cream, 1 gram vaginally every night for one week, then twice weekly at bedtime, Disp: 42.5 g, Rfl: 1 .  fenofibrate 160 MG tablet, Take 160 mg by mouth daily., Disp: , Rfl:  .  FLUoxetine (PROZAC) 40 MG capsule, Take 40 mg by mouth daily., Disp: , Rfl:  .  fluticasone (FLOVENT HFA) 110 MCG/ACT inhaler, Inhale 1 puff into the lungs 2 (two) times daily., Disp: , Rfl:  .  ipratropium (ATROVENT HFA) 17 MCG/ACT inhaler, Inhale 2 puffs into the lungs every 6 (six) hours., Disp: , Rfl:  .  isometheptene-acetaminophen-dichloralphenazone (MIDRIN) 65-100-325 MG capsule, Take 1 capsule by mouth 4 (four) times daily as needed for migraine. Maximum 5 capsules in 12 hours for migraine headaches, 8 capsules in 24 hours for tension headaches., Disp: , Rfl:  .  levothyroxine (SYNTHROID, LEVOTHROID) 100 MCG tablet, Take 100 mcg by mouth every morning., Disp: , Rfl:  .  lisinopril (PRINIVIL,ZESTRIL) 20 MG tablet, Take 1 tablet (20 mg total) by mouth daily., Disp: 14 tablet, Rfl: 0 .  magnesium oxide (MAG-OX) 400 MG tablet, Take 400 mg by mouth daily., Disp: , Rfl:  .  montelukast (SINGULAIR) 10 MG tablet, Take 1 tablet (10 mg total) by mouth at bedtime., Disp: 90 tablet, Rfl: 3 .  omeprazole (PRILOSEC) 40 MG capsule, Take 40 mg by mouth daily., Disp: , Rfl:  .  predniSONE (STERAPRED UNI-PAK 21 TAB) 10 MG (21) TBPK tablet, Take by mouth daily. Take as directed for 6 days, Disp: 21 tablet, Rfl: 0 .  triamcinolone (NASACORT ALLERGY 24HR) 55 MCG/ACT AERO nasal inhaler, Place 2 sprays into the nose daily., Disp: , Rfl:  .  triamcinolone cream (KENALOG) 0.1 %, Apply 1 application topically 2 (two) times daily as needed. , Disp: , Rfl:  .  VICODIN 5-300 MG TABS, Take 1 tablet by mouth 2 (two) times daily as needed., Disp: , Rfl:  .  azithromycin (ZITHROMAX Z-PAK) 250 MG tablet, Take as directed for 6 days, Disp: 6 each, Rfl:  0  Facility-Administered Medications Ordered in Other Encounters:  .  fentaNYL (SUBLIMAZE) injection 25 mcg, 25 mcg, Intravenous, Q5 min PRN, Alvin Critchley, MD .  ondansetron [facility]) injection 4 mg, 4 mg, Intravenous, Once PRN, Alvin Critchley, MD  Allergies Advair diskus [fluticasone-salmeterol]; Codeine; Erythromycin; Morphine and related; Nsaids; and Sulfa antibiotics  Family History  Problem Relation Age of Onset  . Breast cancer Maternal Grandmother   . Stroke Maternal Grandmother   . Bladder Cancer Father   . Prostate cancer Father   . Heart attack Father   . Aortic aneurysm Father   . Congestive Heart Failure Father   . Colon cancer Paternal Grandmother   . Aneurysm Paternal Grandmother   . Stroke Paternal Grandmother   . Congestive Heart Failure Paternal Grandmother   . Dementia Mother   . Stroke Mother   .  Dementia Maternal Grandfather   . Stroke Maternal Grandfather   . Dementia Paternal Grandfather   . Stroke Paternal Grandfather     Social History Social History   Tobacco Use  . Smoking status: Former Research scientist (life sciences)  . Smokeless tobacco: Never Used  Substance Use Topics  . Alcohol use: No  . Drug use: No    Review of Systems Constitutional: Positive reports of fevers ENT: No sore throat. Cardiovascular: Denies chest pain. Respiratory: Positive shortness of breath. Gastrointestinal: No abdominal pain.   Skin: Negative for rash. Neurological: Negative for headaches, focal weakness or numbness.  ____________________________________________   PHYSICAL EXAM:  VITAL SIGNS: ED Triage Vitals [12/31/17 1615]  Enc Vitals Group     BP (!) 164/94     Pulse Rate 97     Resp 30     Temp 98.4 F (36.9 C)     Temp Source Oral     SpO2 100 %     Weight      Height      Head Circumference      Peak Flow      Pain Score      Pain Loc      Pain Edu?      Excl. in [place]?     Constitutional: Alert and oriented. Well appearing and in no acute distress. Eyes:  Conjunctivae are normal.  Head: Atraumatic. No sinus tenderness to palpation. No swelling. No erythema.  Nose:Nasal congestion with clear rhinorrhea  Mouth/Throat: Mucous membranes are moist. Mild pharyngeal erythema. No tonsillar swelling or exudate.  Neck: No stridor.  No cervical spine tenderness to palpation. Hematological/Lymphatic/Immunilogical: No cervical lymphadenopathy. Cardiovascular: Tachycardia.  Otherwise grossly normal heart sounds.  Good peripheral circulation. Respiratory: Tachypneic.  Speaking in 3-4 word sentences.  Diffuse inspiratory and expiratory wheezes.  Scattered rhonchi.  Intermittently coughing. Gastrointestinal: Soft and nontender.  Musculoskeletal: Ambulatory with steady gait. Neurologic:  Normal speech and language. No gait instability. Skin:  Skin appears warm, dry and intact. No rash noted. Psychiatric: Mood and affect are normal. Speech and behavior are normal.  ___________________________________________   LABS (all labs ordered are listed, but only abnormal results are displayed)  Labs Reviewed - No data to display   PROCEDURES Procedures    INITIAL IMPRESSION / ASSESSMENT AND PLAN / ED COURSE  Pertinent labs & imaging results that were available during my care of the patient were reviewed by me and considered in my medical decision making (see chart for details).  Patient with diffuse wheezing, suspect asthma exacerbation secondary to influenza versus viral illness.  Patient with continued wheezing, even with taking outpatient Z-Pak and prednisone.  Suspect failed outpatient therapy.  Patient not hypoxic at urgent care, resting O2 sats as above, ambulatory did not go below 93%.  Patient given DuoNeb in urgent care, continues to have shortness of breath as well as wheezing.  Recommend patient to be seen in the ER, concern for need for admission.  Discussed transfer by EMS, patient declined and states that her husband will take her directly to  [place] RN charge nurse at [place] called and given report.  Patient stable at time of discharge.  ____________________________________________   FINAL CLINICAL IMPRESSION(S) / ED DIAGNOSES  Final diagnoses:  Influenza-like illness  Exacerbation of asthma, unspecified asthma severity, unspecified whether persistent     ED Discharge Orders    None       Note: This dictation was prepared with Dragon dictation along with smaller phrase technology.  Any transcriptional errors that result from this process are unintentional.         Marylene Land, NP 12/31/17 1818

## 2017-12-31 NOTE — H&P (Signed)
[facility] [place] NAME: Bonnie West    MR#:  [id number]  DATE OF BIRTH:  October 09, 1951  DATE OF ADMISSION:  12/31/2017  PRIMARY CARE PHYSICIAN: Lavera Guise, MD   REQUESTING/REFERRING PHYSICIAN: Dr. Rip Harbour.  CHIEF COMPLAINT:   Chief Complaint  Patient presents with  . Shortness of Breath   Cough, shortness of breath and wheezing for 5 days. HISTORY OF PRESENT ILLNESS:  Bonnie West  is a 67 y.o. female with a known history of multiple medical problems as below.  The patient presents the ED with above chief complaints.  She started to have cough, sputum, shortness of breath and wheezing 5-6 days ago.  She was given Z-Pak and prednisone by her doctor.  Her symptoms has been worsening and she complains of body aching all over.  She is found hypoxia in the ED.  Chest x-ray did not show any infiltrate but influenza test is positive for influenza A.  PAST MEDICAL HISTORY:   Past Medical History:  Diagnosis Date  . Allergic rhinitis   . Anxiety   . Asthma   . Blood transfusion without reported diagnosis   . Depression   . Diverticulosis   . Dyspnea   . Esophagitis   . GERD (gastroesophageal reflux disease)   . Headache   . Heart murmur   . Hyperlipemia   . Hypertension   . Hypothyroidism   . Obesity   . Osteopenia   . Palpitations   . Pneumothorax   . Sleep apnea     PAST SURGICAL HISTORY:   Past Surgical History:  Procedure Laterality Date  . BUNIONECTOMY    . CATARACT EXTRACTION W/PHACO Right 10/11/2016   Procedure: CATARACT EXTRACTION PHACO AND INTRAOCULAR LENS PLACEMENT (IOC);  Surgeon: Birder Robson, MD;  Location: [facility] ORS;  Service: Ophthalmology;  Laterality: Right;  Lot# [id number] H [place]: 00:37.7 AP%: 18.1 CDE: 6.80  . CATARACT EXTRACTION W/PHACO Left 11/08/2016   Procedure: CATARACT EXTRACTION PHACO AND INTRAOCULAR LENS PLACEMENT (IOC);  Surgeon: Birder Robson, MD;  Location: [facility] ORS;  Service: Ophthalmology;   Laterality: Left;  PACK LOT: [id number] H US:00:32 AP:44 CDE:6.46  . COLONOSCOPY    . COLONOSCOPY WITH PROPOFOL N/A 11/26/2015   Procedure: COLONOSCOPY WITH PROPOFOL;  Surgeon: Manya Silvas, MD;  Location: [facility] ENDOSCOPY;  Service: Endoscopy;  Laterality: N/A;  . EYE SURGERY    . TONSILLECTOMY      SOCIAL HISTORY:   Social History   Tobacco Use  . Smoking status: Former Research scientist (life sciences)  . Smokeless tobacco: Never Used  Substance Use Topics  . Alcohol use: No    FAMILY HISTORY:   Family History  Problem Relation Age of Onset  . Breast cancer Maternal Grandmother   . Stroke Maternal Grandmother   . Bladder Cancer Father   . Prostate cancer Father   . Heart attack Father   . Aortic aneurysm Father   . Congestive Heart Failure Father   . Colon cancer Paternal Grandmother   . Aneurysm Paternal Grandmother   . Stroke Paternal Grandmother   . Congestive Heart Failure Paternal Grandmother   . Dementia Mother   . Stroke Mother   . Dementia Maternal Grandfather   . Stroke Maternal Grandfather   . Dementia Paternal Grandfather   . Stroke Paternal Grandfather     DRUG ALLERGIES:   Allergies  Allergen Reactions  . Advair Diskus [Fluticasone-Salmeterol] Other (See Comments)    Causes asthma attack   . Codeine   .  Erythromycin Other (See Comments)    Stomach pain  . Morphine And Related Itching and Nausea And Vomiting  . Nsaids   . Sulfa Antibiotics Other (See Comments)    unknown    REVIEW OF SYSTEMS:   Review of Systems  Constitutional: Positive for malaise/fatigue. Negative for chills and fever.  HENT: Negative for sore throat.   Eyes: Negative for blurred vision and double vision.  Respiratory: Positive for cough, sputum production, shortness of breath and wheezing. Negative for hemoptysis and stridor.   Cardiovascular: Negative for chest pain, palpitations, orthopnea and leg swelling.  Gastrointestinal: Negative for abdominal pain, blood in stool, diarrhea, melena,  nausea and vomiting.  Genitourinary: Negative for dysuria, flank pain and hematuria.  Musculoskeletal: Negative for back pain and joint pain.  Neurological: Positive for weakness. Negative for dizziness, sensory change, focal weakness, seizures, loss of consciousness and headaches.  Endo/Heme/Allergies: Negative for polydipsia.  Psychiatric/Behavioral: Negative for depression. The patient is not nervous/anxious.     MEDICATIONS AT HOME:   Prior to Admission medications   Medication Sig Start Date End Date Taking? Authorizing Provider  albuterol (PROVENTIL HFA;VENTOLIN HFA) 108 (90 Base) MCG/ACT inhaler Inhale 2 puffs into the lungs every 6 (six) hours as needed for wheezing or shortness of breath.    [provider]  ALPRAZolam Duanne Moron) 0.25 MG tablet Take 1 tablet (0.25 mg total) by mouth 2 (two) times daily as needed. 12/20/17   Ronnell Freshwater, NP  amLODipine (NORVASC) 5 MG tablet TAKE 1 TABLET BY MOUTH ONCE DAILY 11/07/17   Lavera Guise, MD  azithromycin (ZITHROMAX Z-PAK) 250 MG tablet Take as directed for 6 days 12/27/17   Ronnell Freshwater, NP  betamethasone valerate ointment (VALISONE) 0.1 % Apply a pea sized amount topically bid x 1-2 weeks as needed 07/07/17   Salvadore Dom, MD  bisacodyl (DULCOLAX) 10 MG suppository Place 10 mg rectally as needed for moderate constipation.    [provider]  cetirizine (ZYRTEC) 10 MG tablet Take 10 mg by mouth daily.    [provider]  Cholecalciferol (VITAMIN D-1000 MAX ST) 1000 units tablet Take by mouth.    [provider]  clindamycin (CLEOCIN T) 1 % external solution Apply 1 application topically 2 (two) times daily.    [provider]  Cyanocobalamin (VITAMIN B12 PO) Take 1 tablet by mouth daily.    [provider]  cyclobenzaprine (FLEXERIL) 5 MG tablet Take 5 mg by mouth 3 (three) times daily as needed for muscle spasms.    [provider]  docusate sodium (COLACE) 100 MG  capsule Take 100 mg by mouth 2 (two) times daily.    [provider]  estradiol (ESTRACE) 0.1 MG/GM vaginal cream 1 gram vaginally every night for one week, then twice weekly at bedtime 06/15/17   Salvadore Dom, MD  fenofibrate 160 MG tablet Take 160 mg by mouth daily.    [provider]  FLUoxetine (PROZAC) 40 MG capsule Take 40 mg by mouth daily.    [provider]  fluticasone (FLOVENT HFA) 110 MCG/ACT inhaler Inhale 1 puff into the lungs 2 (two) times daily.    [provider]  ipratropium (ATROVENT HFA) 17 MCG/ACT inhaler Inhale 2 puffs into the lungs every 6 (six) hours.    [provider]  isometheptene-acetaminophen-dichloralphenazone (MIDRIN) [phone] MG capsule Take 1 capsule by mouth 4 (four) times daily as needed for migraine. Maximum 5 capsules in 12 hours for migraine headaches, 8 capsules  in 24 hours for tension headaches.    [provider]  levothyroxine (SYNTHROID, LEVOTHROID) 100 MCG tablet Take 100 mcg by mouth every morning. 08/04/16   [provider]  lisinopril (PRINIVIL,ZESTRIL) 20 MG tablet Take 1 tablet (20 mg total) by mouth daily. 05/01/17   Norval Gable, MD  magnesium oxide (MAG-OX) 400 MG tablet Take 400 mg by mouth daily.    [provider]  montelukast (SINGULAIR) 10 MG tablet Take 1 tablet (10 mg total) by mouth at bedtime. 11/07/17   Lavera Guise, MD  omeprazole (PRILOSEC) 40 MG capsule Take 40 mg by mouth daily.    [provider]  predniSONE (STERAPRED UNI-PAK 21 TAB) 10 MG (21) TBPK tablet Take by mouth daily. Take as directed for 6 days 12/27/17   Ronnell Freshwater, NP  triamcinolone (NASACORT ALLERGY 24HR) 55 MCG/ACT AERO nasal inhaler Place 2 sprays into the nose daily.    [provider]  triamcinolone cream (KENALOG) 0.1 % Apply 1 application topically 2 (two) times daily as needed.     [provider]  VICODIN 5-300 MG TABS Take 1 tablet by mouth 2 (two)  times daily as needed. 08/16/16   [provider]      VITAL SIGNS:  Blood pressure (!) 154/81, pulse 99, temperature 99 F (37.2 C), temperature source Oral, resp. rate (!) 26, height 5\' 1"  (1.549 m), weight 162 lb (73.5 kg), SpO2 98 %.  PHYSICAL EXAMINATION:  Physical Exam  GENERAL:  67 y.o.-year-old patient lying in the bed with no acute distress.  EYES: Pupils equal, round, reactive to light and accommodation. No scleral icterus. Extraocular muscles intact.  HEENT: Head atraumatic, normocephalic. Oropharynx and nasopharynx clear.  NECK:  Supple, no jugular venous distention. No thyroid enlargement, no tenderness.  LUNGS: Normal breath sounds bilaterally, moderate expiratory wheezing, and rhonchi. No use of accessory muscles of respiration.  CARDIOVASCULAR: S1, S2 normal. No murmurs, rubs, or gallops.  ABDOMEN: Soft, nontender, nondistended. Bowel sounds present. No organomegaly or mass.  EXTREMITIES: No pedal edema, cyanosis, or clubbing.  NEUROLOGIC: Cranial nerves II through XII are intact. Muscle strength 5/5 in all extremities. Sensation intact. Gait not checked.  PSYCHIATRIC: The patient is alert and oriented x 3.  SKIN: No obvious rash, lesion, or ulcer.   LABORATORY PANEL:   CBC Recent Labs  Lab 12/31/17 1812  WBC 5.9  HGB 14.5  HCT 42.4  PLT 295   ------------------------------------------------------------------------------------------------------------------  Chemistries  Recent Labs  Lab 12/31/17 1812  NA 141  K 3.6  CL 104  CO2 22  GLUCOSE 132*  BUN 16  CREATININE 0.84  CALCIUM 10.1   ------------------------------------------------------------------------------------------------------------------  Cardiac Enzymes No results for input(s): TROPONINI in the last 168 hours. ------------------------------------------------------------------------------------------------------------------  RADIOLOGY:  Dg Chest 2 View  Result Date:  12/31/2017 CLINICAL DATA:  Cough, congestion, wheezing and shortness of breath for 5 days, history asthma, hypertension EXAM: CHEST  2 VIEW COMPARISON:  09/22/2016 FINDINGS: Normal heart size, mediastinal contours, and pulmonary vascularity. Slightly increased chronic bronchitic changes and accentuation of perihilar markings. Lungs otherwise clear. No pleural effusion or pneumothorax. Bones unremarkable. IMPRESSION: Increased bronchitic changes and accentuation of perihilar markings which could reflect worsened bronchitis or asthma. No definite acute infiltrate. Electronically Signed   By: Lavonia Dana M.D.   On: 12/31/2017 18:34      IMPRESSION AND PLAN:   Acute respiratory failure with hypoxia due to asthma exacerbation and influenza A. The patient will be admitted to medical floor.  Continue oxygen by nasal cannula, DuoNeb, Robitussin as needed, continue Tamiflu.  Hypertension.  Continue hypertension medication.  All the records are reviewed and case discussed with ED provider. Management plans discussed with the patient, family and they are in agreement.  CODE STATUS: Full code  TOTAL TIME TAKING CARE OF THIS PATIENT: 53 minutes.    Demetrios Loll M.D on 12/31/2017 at 9:07 PM  Between 7am to 6pm - Pager - [phone]  After 6pm go to [email or web address] - password [email or web address] [facility]  [employer] [place]  Office  [phone]  CC: Primary care physician; Lavera Guise, MD   Note: This dictation was prepared with Dragon dictation along with smaller phrase technology. Any transcriptional errors that result from this process are unin

## 2017-12-31 NOTE — ED Provider Notes (Addendum)
[facility] Emergency Department Provider Note   ____________________________________________   First MD Initiated Contact with Patient 12/31/17 1835     (approximate)  I have reviewed the triage vital signs and the nursing notes.   HISTORY  Chief Complaint Shortness of Breath   HPI Bonnie West is a 67 y.o. female Who reports she has history of asthma. She's had a cough congestion wheezing and shortness of breath that started this last Tuesday. Her husband recently was sick with something similar and she says she caught it from him. She says she often gets this like this. She had prednisone and Z-Pak called in and she finished that except for 1 more day prednisone pill. She is not getting any better. She feels like she has a fever at home. She is aching all over.  Past Medical History:  Diagnosis Date  . Allergic rhinitis   . Anxiety   . Asthma   . Blood transfusion without reported diagnosis   . Depression   . Diverticulosis   . Dyspnea   . Esophagitis   . GERD (gastroesophageal reflux disease)   . Headache   . Heart murmur   . Hyperlipemia   . Hypertension   . Hypothyroidism   . Obesity   . Osteopenia   . Palpitations   . Pneumothorax   . Sleep apnea     There are no active problems to display for this patient.   Past Surgical History:  Procedure Laterality Date  . BUNIONECTOMY    . CATARACT EXTRACTION W/PHACO Right 10/11/2016   Procedure: CATARACT EXTRACTION PHACO AND INTRAOCULAR LENS PLACEMENT (IOC);  Surgeon: Birder Robson, MD;  Location: [facility] ORS;  Service: Ophthalmology;  Laterality: Right;  Lot# [id number] H [place]: 00:37.7 AP%: 18.1 CDE: 6.80  . CATARACT EXTRACTION W/PHACO Left 11/08/2016   Procedure: CATARACT EXTRACTION PHACO AND INTRAOCULAR LENS PLACEMENT (IOC);  Surgeon: Birder Robson, MD;  Location: [facility] ORS;  Service: Ophthalmology;  Laterality: Left;  PACK LOT: [id number] H US:00:32 AP:44 CDE:6.46  . COLONOSCOPY    .  COLONOSCOPY WITH PROPOFOL N/A 11/26/2015   Procedure: COLONOSCOPY WITH PROPOFOL;  Surgeon: Manya Silvas, MD;  Location: [facility] ENDOSCOPY;  Service: Endoscopy;  Laterality: N/A;  . EYE SURGERY    . TONSILLECTOMY      Prior to Admission medications   Medication Sig Start Date End Date Taking? Authorizing Provider  albuterol (PROVENTIL HFA;VENTOLIN HFA) 108 (90 Base) MCG/ACT inhaler Inhale 2 puffs into the lungs every 6 (six) hours as needed for wheezing or shortness of breath.    [provider]  ALPRAZolam Duanne Moron) 0.25 MG tablet Take 1 tablet (0.25 mg total) by mouth 2 (two) times daily as needed. 12/20/17   Ronnell Freshwater, NP  amLODipine (NORVASC) 5 MG tablet TAKE 1 TABLET BY MOUTH ONCE DAILY 11/07/17   Lavera Guise, MD  azithromycin (ZITHROMAX Z-PAK) 250 MG tablet Take as directed for 6 days 12/27/17   Ronnell Freshwater, NP  betamethasone valerate ointment (VALISONE) 0.1 % Apply a pea sized amount topically bid x 1-2 weeks as needed 07/07/17   Salvadore Dom, MD  bisacodyl (DULCOLAX) 10 MG suppository Place 10 mg rectally as needed for moderate constipation.    [provider]  cetirizine (ZYRTEC) 10 MG tablet Take 10 mg by mouth daily.    [provider]  Cholecalciferol (VITAMIN D-1000 MAX ST) 1000 units tablet Take by mouth.    [provider]  clindamycin (CLEOCIN T) 1 % external  solution Apply 1 application topically 2 (two) times daily.    [provider]  Cyanocobalamin (VITAMIN B12 PO) Take 1 tablet by mouth daily.    [provider]  cyclobenzaprine (FLEXERIL) 5 MG tablet Take 5 mg by mouth 3 (three) times daily as needed for muscle spasms.    [provider]  docusate sodium (COLACE) 100 MG capsule Take 100 mg by mouth 2 (two) times daily.    [provider]  estradiol (ESTRACE) 0.1 MG/GM vaginal cream 1 gram vaginally every night for one week, then twice weekly at bedtime 06/15/17   Salvadore Dom, MD    fenofibrate 160 MG tablet Take 160 mg by mouth daily.    [provider]  FLUoxetine (PROZAC) 40 MG capsule Take 40 mg by mouth daily.    [provider]  fluticasone (FLOVENT HFA) 110 MCG/ACT inhaler Inhale 1 puff into the lungs 2 (two) times daily.    [provider]  ipratropium (ATROVENT HFA) 17 MCG/ACT inhaler Inhale 2 puffs into the lungs every 6 (six) hours.    [provider]  isometheptene-acetaminophen-dichloralphenazone (MIDRIN) [id number] MG capsule Take 1 capsule by mouth 4 (four) times daily as needed for migraine. Maximum 5 capsules in 12 hours for migraine headaches, 8 capsules in 24 hours for tension headaches.    [provider]  levothyroxine (SYNTHROID, LEVOTHROID) 100 MCG tablet Take 100 mcg by mouth every morning. 08/04/16   [provider]  lisinopril (PRINIVIL,ZESTRIL) 20 MG tablet Take 1 tablet (20 mg total) by mouth daily. 05/01/17   Norval Gable, MD  magnesium oxide (MAG-OX) 400 MG tablet Take 400 mg by mouth daily.    [provider]  montelukast (SINGULAIR) 10 MG tablet Take 1 tablet (10 mg total) by mouth at bedtime. 11/07/17   Lavera Guise, MD  omeprazole (PRILOSEC) 40 MG capsule Take 40 mg by mouth daily.    [provider]  predniSONE (STERAPRED UNI-PAK 21 TAB) 10 MG (21) TBPK tablet Take by mouth daily. Take as directed for 6 days 12/27/17   Ronnell Freshwater, NP  triamcinolone (NASACORT ALLERGY 24HR) 55 MCG/ACT AERO nasal inhaler Place 2 sprays into the nose daily.    [provider]  triamcinolone cream (KENALOG) 0.1 % Apply 1 application topically 2 (two) times daily as needed.     [provider]  VICODIN 5-300 MG TABS Take 1 tablet by mouth 2 (two) times daily as needed. 08/16/16   [provider]    Allergies Advair diskus [fluticasone-salmeterol]; Codeine; Erythromycin; Morphine and related; Nsaids; and Sulfa antibiotics  Family History  Problem Relation Age  of Onset  . Breast cancer Maternal Grandmother   . Stroke Maternal Grandmother   . Bladder Cancer Father   . Prostate cancer Father   . Heart attack Father   . Aortic aneurysm Father   . Congestive Heart Failure Father   . Colon cancer Paternal Grandmother   . Aneurysm Paternal Grandmother   . Stroke Paternal Grandmother   . Congestive Heart Failure Paternal Grandmother   . Dementia Mother   . Stroke Mother   . Dementia Maternal Grandfather   . Stroke Maternal Grandfather   . Dementia Paternal Grandfather   . Stroke Paternal Grandfather     Social History Social History   Tobacco Use  . Smoking status: Former Research scientist (life sciences)  . Smokeless tobacco: Never Used  Substance Use Topics  . Alcohol use: No  . Drug use: No  Review of Systems  Constitutional:  fever/chills Eyes: No visual changes. ENT: No sore throat. Cardiovascular: Denies chest pain. Respiratory:  shortness of breath. Gastrointestinal: No abdominal pain.  No nausea, no vomiting.  No diarrhea.  No constipation. Genitourinary: Negative for dysuria. Musculoskeletal: Negative for back pain. Skin: Negative for rash. Neurological: Negative for headaches, focal weakness  ____________________________________________   PHYSICAL EXAM:  VITAL SIGNS: ED Triage Vitals  Enc Vitals Group     BP 12/31/17 1759 (!) 244/213     Pulse Rate 12/31/17 1759 (!) 102     Resp 12/31/17 1759 (!) 22     Temp 12/31/17 1759 99.6 F (37.6 C)     Temp src --      SpO2 12/31/17 1759 98 %     Weight 12/31/17 1800 162 lb (73.5 kg)     Height 12/31/17 1800 5\' 1"  (1.549 m)     Head Circumference --      Peak Flow --      Pain Score 12/31/17 1800 4     Pain Loc --      Pain Edu? --      Excl. in [place]? --     Constitutional: Alert and oriented. breathing hard. Eyes: Conjunctivae are normal.  Head: Atraumatic. Nose: No congestion/rhinnorhea. Mouth/Throat: Mucous membranes are moist.  Oropharynx non-erythematous. Neck: No stridor.    Cardiovascular: Normal rate, regular rhythm. Grossly normal heart sounds.  Good peripheral circulation. Respiratory: Normal respiratory effort.  No retractions. Lungs wheezes diffusely Gastrointestinal: Soft and nontender. No distention. No abdominal bruits. No CVA tenderness. Musculoskeletal: No lower extremity tenderness nor edema.  No joint effusions. Neurologic:  Normal speech and language. No gross focal neurologic deficits are appreciated.  Skin:  Skin is warm, dry and intact. No rash noted. Psychiatric: Mood and affect are normal. Speech and behavior are normal.  ____________________________________________   LABS (all labs ordered are listed, but only abnormal results are displayed)  Labs Reviewed  INFLUENZA PANEL BY PCR (TYPE A & B) - Abnormal; Notable for the following components:      Result Value   Influenza A By PCR POSITIVE (*)    All other components within normal limits  BASIC METABOLIC PANEL - Abnormal; Notable for the following components:   Glucose, Bld 132 (*)    All other components within normal limits  CBC   ____________________________________________  EKG   ____________________________________________  RADIOLOGY  ED MD interpretation:  chest x-ray reported as clear I review the film and agree  Official radiology report(s): Dg Chest 2 View  Result Date: 12/31/2017 CLINICAL DATA:  Cough, congestion, wheezing and shortness of breath for 5 days, history asthma, hypertension EXAM: CHEST  2 VIEW COMPARISON:  09/22/2016 FINDINGS: Normal heart size, mediastinal contours, and pulmonary vascularity. Slightly increased chronic bronchitic changes and accentuation of perihilar markings. Lungs otherwise clear. No pleural effusion or pneumothorax. Bones unremarkable. IMPRESSION: Increased bronchitic changes and accentuation of perihilar markings which could reflect worsened bronchitis or asthma. No definite acute infiltrate. Electronically Signed   By: Lavonia Dana  M.D.   On: 12/31/2017 18:34    ____________________________________________   PROCEDURES  Procedure(s) performed:  Procedures  Critical Care performed:   ____________________________________________   INITIAL IMPRESSION / ASSESSMENT AND PLAN / ED COURSE  after 3 nebulizer treatments patient remains short of breath tachypnea can wheezing. O2 sats are about 93% on room air. She's had site Medrol as well. She also had outpatient Zithromax and prednisone. I will start her on Tamiflu  and plan on admitting her for continued nebulizer treatment and steroids.   patient walks back and forth in front of the stretcher O2 sat drops to 8889 patient a sickly cannot walk any further and slumps into the stretcher almost falling in it.      ____________________________________________   FINAL CLINICAL IMPRESSION(S) / ED DIAGNOSES  Final diagnoses:  Exacerbation of asthma, unspecified asthma severity, unspecified whether persistent  Flu     ED Discharge Orders    None       Note:  This document was prepared using Dragon voice recognition software and may include unintentional dictation errors.    Nena Polio, MD 12/31/17 Lona Kettle    Nena Polio, MD 12/31/17 2015

## 2017-12-31 NOTE — ED Triage Notes (Signed)
Patient has been experiencing shortness of breath and wheezing since Tuesday.

## 2018-01-01 LAB — BASIC METABOLIC PANEL
Anion gap: 15 (ref 5–15)
BUN: 18 mg/dL (ref 6–20)
CO2: 23 mmol/L (ref 22–32)
CREATININE: 0.86 mg/dL (ref 0.44–1.00)
Calcium: 9.8 mg/dL (ref 8.9–10.3)
Chloride: 103 mmol/L (ref 101–111)
GFR calc Af Amer: >60 mL/min (ref >60)
GFR calc non Af Amer: >60 mL/min (ref >60)
GLUCOSE: 189 mg/dL — AB (ref 65–99)
Potassium: 3.2 mmol/L — ABNORMAL LOW (ref 3.5–5.1)
Sodium: 141 mmol/L (ref 135–145)

## 2018-01-01 LAB — CBC
HEMATOCRIT: 38.6 % (ref 35.0–47.0)
HEMOGLOBIN: 13.1 g/dL (ref 12.0–16.0)
MCH: 31.4 pg (ref 26.0–34.0)
MCHC: 34 g/dL (ref 32.0–36.0)
MCV: 92.5 fL (ref 80.0–100.0)
Platelets: 289 10*3/uL (ref 150–440)
RBC: 4.17 MIL/uL (ref 3.80–5.20)
RDW: 12.8 % (ref 11.5–14.5)
WBC: 4.6 10*3/uL (ref 3.6–11.0)

## 2018-01-01 MED ORDER — POTASSIUM CHLORIDE CRYS ER 20 MEQ PO TBCR
40.0000 meq | EXTENDED_RELEASE_TABLET | Freq: Once | ORAL | Status: AC
  Administered 2018-01-01: 40 meq via ORAL
  Filled 2018-01-01: qty 2

## 2018-01-01 MED ORDER — BUDESONIDE 0.5 MG/2ML IN SUSP
0.5000 mg | Freq: Two times a day (BID) | RESPIRATORY_TRACT | Status: DC
  Administered 2018-01-01 – 2018-01-09 (×17): 0.5 mg via RESPIRATORY_TRACT
  Filled 2018-01-01 (×17): qty 2

## 2018-01-01 MED ORDER — ALBUTEROL SULFATE (2.5 MG/3ML) 0.083% IN NEBU
2.5000 mg | INHALATION_SOLUTION | RESPIRATORY_TRACT | Status: DC | PRN
  Administered 2018-01-01 – 2018-01-07 (×3): 2.5 mg via RESPIRATORY_TRACT
  Filled 2018-01-01 (×3): qty 3

## 2018-01-01 MED ORDER — BENZONATATE 100 MG PO CAPS
100.0000 mg | ORAL_CAPSULE | Freq: Three times a day (TID) | ORAL | Status: DC | PRN
  Administered 2018-01-01 – 2018-01-09 (×10): 100 mg via ORAL
  Filled 2018-01-01 (×12): qty 1

## 2018-01-01 MED ORDER — IPRATROPIUM-ALBUTEROL 0.5-2.5 (3) MG/3ML IN SOLN
3.0000 mL | Freq: Four times a day (QID) | RESPIRATORY_TRACT | Status: DC
  Administered 2018-01-01 – 2018-01-09 (×32): 3 mL via RESPIRATORY_TRACT
  Filled 2018-01-01 (×31): qty 3

## 2018-01-01 MED ORDER — ALUM & MAG HYDROXIDE-SIMETH 200-200-20 MG/5ML PO SUSP
30.0000 mL | Freq: Four times a day (QID) | ORAL | Status: DC | PRN
  Administered 2018-01-02 – 2018-01-06 (×4): 30 mL via ORAL
  Filled 2018-01-01 (×4): qty 30

## 2018-01-01 NOTE — Progress Notes (Signed)
[place] at [place] NAME: Bonnie West    MR#:  [id number]  DATE OF BIRTH:  02-12-1951  SUBJECTIVE:   Patient here due to shortness of breath and respiratory distress noted to be positive for influenza A. Patient also noted to be in mild COPD exacerbation. Continues to have significant wheezing and exertional dyspnea.  REVIEW OF SYSTEMS:    Review of Systems  Constitutional: Negative for chills and fever.  HENT: Negative for congestion and tinnitus.   Eyes: Negative for blurred vision and double vision.  Respiratory: Positive for cough, shortness of breath and wheezing.   Cardiovascular: Negative for chest pain, orthopnea and PND.  Gastrointestinal: Negative for abdominal pain, diarrhea, nausea and vomiting.  Genitourinary: Negative for dysuria and hematuria.  Neurological: Negative for dizziness, sensory change and focal weakness.  All other systems reviewed and are negative.   Nutrition: Heart Healthy Tolerating Diet: Yes Tolerating PT: Await Eval.     DRUG ALLERGIES:   Allergies  Allergen Reactions  . Advair Diskus [Fluticasone-Salmeterol] Other (See Comments)    Causes asthma attack   . Codeine   . Erythromycin Other (See Comments)    Stomach pain  . Morphine And Related Itching and Nausea And Vomiting  . Nsaids   . Sulfa Antibiotics Other (See Comments)    unknown    VITALS:  Blood pressure (!) 166/89, pulse 97, temperature 98.9 F (37.2 C), temperature source Oral, resp. rate (!) 22, height 5\' 1"  (1.549 m), weight 70.2 kg (154 lb 12.2 oz), SpO2 98 %.  PHYSICAL EXAMINATION:   Physical Exam  GENERAL:  67 y.o.-year-old patient lying in bed in mild to mod. Resp. Distress.   EYES: Pupils equal, round, reactive to light and accommodation. No scleral icterus. Extraocular muscles intact.  HEENT: Head atraumatic, normocephalic. Oropharynx and nasopharynx clear.  NECK:  Supple, no jugular venous distention. No thyroid  enlargement, no tenderness.  LUNGS: Diffuse wheezing, rhonchi bilaterally. Good air entry bilaterally, positive use of accessory muscles.  CARDIOVASCULAR: S1, S2 normal. No murmurs, rubs, or gallops.  ABDOMEN: Soft, nontender, nondistended. Bowel sounds present. No organomegaly or mass.  EXTREMITIES: No cyanosis, clubbing or edema b/l.    NEUROLOGIC: Cranial nerves II through XII are intact. No focal Motor or sensory deficits b/l.   PSYCHIATRIC: The patient is alert and oriented x 3.  SKIN: No obvious rash, lesion, or ulcer.     LABORATORY PANEL:   CBC Recent Labs  Lab 01/01/18 0524  WBC 4.6  HGB 13.1  HCT 38.6  PLT 289   ------------------------------------------------------------------------------------------------------------------  Chemistries  Recent Labs  Lab 12/31/17 1812 01/01/18 0524  NA 141 141  K 3.6 3.2*  CL 104 103  CO2 22 23  GLUCOSE 132* 189*  BUN 16 18  CREATININE 0.84 0.86  CALCIUM 10.1 9.8  MG 2.0  --    ------------------------------------------------------------------------------------------------------------------  Cardiac Enzymes No results for input(s): TROPONINI in the last 168 hours. ------------------------------------------------------------------------------------------------------------------  RADIOLOGY:  Dg Chest 2 View  Result Date: 12/31/2017 CLINICAL DATA:  Cough, congestion, wheezing and shortness of breath for 5 days, history asthma, hypertension EXAM: CHEST  2 VIEW COMPARISON:  09/22/2016 FINDINGS: Normal heart size, mediastinal contours, and pulmonary vascularity. Slightly increased chronic bronchitic changes and accentuation of perihilar markings. Lungs otherwise clear. No pleural effusion or pneumothorax. Bones unremarkable. IMPRESSION: Increased bronchitic changes and accentuation of perihilar markings which could reflect worsened bronchitis or asthma. No definite acute infiltrate. Electronically Signed   By: Elta Guadeloupe  Thornton Papas M.D.    On: 12/31/2017 18:34     ASSESSMENT AND PLAN:   67 year old female with past medical history of asthma, hypertension, hyperlipidemia, hypothyroidism, obesity, obstructive sleep apnea, history of diverticulosis who presented to the hospital due to shortness of breath and noted to be acute respiratory failure with hypoxia secondary to flu.  1. Acute respiratory failure with hypoxia-secondary to asthma exacerbation and flu. -Continue IV steroids, will add scheduled DuoNeb nebs, Pulmicort nebs. Continue O2 supplementation. -We'll add some antitussives and continue Tamiflu for the flu.  2. Flu-patient is positive for influenza A. -Continue droplet precautions, Tamiflu. We'll add Tessalon Perles, Robitussin. -Continue further treatment as mentioned above.  3. Hypothyroidism-continue Synthroid.  4. Essential hypertension-continue lisinopril  5. GERD-continue Protonix.  6. Hypokalemia-we'll give oral potassium supplements, check mag level.  7. Depression-cont. Prozac.    All the records are reviewed and case discussed with Care Management/Social Worker. Management plans discussed with the patient, family and they are in agreement.  CODE STATUS: Full code  DVT Prophylaxis: Lovenox  TOTAL TIME TAKING CARE OF THIS PATIENT: 30 minutes.   POSSIBLE D/C IN 1-2 DAYS, DEPENDING ON CLINICAL CONDITION.   Henreitta Leber M.D on 01/01/2018 at 2:00 PM  Between 7am to 6pm - Pager - [phone]  After 6pm go to [email or web address] - password [email or web address] [facility]  Office  [phone]  CC: Primary care physician; Lavera Guise, MD

## 2018-01-02 LAB — POTASSIUM: Potassium: 4 mmol/L (ref 3.5–5.1)

## 2018-01-02 NOTE — Progress Notes (Signed)
[place] at [place] NAME: Bonnie West    MR#:  [id number]  DATE OF BIRTH:  1951-08-10  SUBJECTIVE:   Patient here due to shortness of breath and respiratory distress and noted to be positive for influenza A.  Continues to have wheezing/bronchospasm.  Afebrile.  No other complaints.   REVIEW OF SYSTEMS:    Review of Systems  Constitutional: Negative for chills and fever.  HENT: Negative for congestion and tinnitus.   Eyes: Negative for blurred vision and double vision.  Respiratory: Positive for cough, shortness of breath and wheezing.   Cardiovascular: Negative for chest pain, orthopnea and PND.  Gastrointestinal: Negative for abdominal pain, diarrhea, nausea and vomiting.  Genitourinary: Negative for dysuria and hematuria.  Neurological: Negative for dizziness, sensory change and focal weakness.  All other systems reviewed and are negative.   Nutrition: Heart Healthy Tolerating Diet: Yes Tolerating PT: Await Eval.     DRUG ALLERGIES:   Allergies  Allergen Reactions  . Advair Diskus [Fluticasone-Salmeterol] Other (See Comments)    Causes asthma attack   . Codeine   . Erythromycin Other (See Comments)    Stomach pain  . Morphine And Related Itching and Nausea And Vomiting  . Nsaids   . Sulfa Antibiotics Other (See Comments)    unknown    VITALS:  Blood pressure (!) 170/84, pulse 92, temperature 98.6 F (37 C), temperature source Oral, resp. rate 16, height 5\' 1"  (1.549 m), weight 70.2 kg (154 lb 12.2 oz), SpO2 96 %.  PHYSICAL EXAMINATION:   Physical Exam  GENERAL:  67 y.o.-year-old patient lying in bed in mild Resp. Distress.   EYES: Pupils equal, round, reactive to light and accommodation. No scleral icterus. Extraocular muscles intact.  HEENT: Head atraumatic, normocephalic. Oropharynx and nasopharynx clear.  NECK:  Supple, no jugular venous distention. No thyroid enlargement, no tenderness.  LUNGS: Diffuse  wheezing, rhonchi bilaterally. Good air entry bilaterally, positive use of accessory muscles.  CARDIOVASCULAR: S1, S2 normal. No murmurs, rubs, or gallops.  ABDOMEN: Soft, nontender, nondistended. Bowel sounds present. No organomegaly or mass.  EXTREMITIES: No cyanosis, clubbing or edema b/l.    NEUROLOGIC: Cranial nerves II through XII are intact. No focal Motor or sensory deficits b/l.   PSYCHIATRIC: The patient is alert and oriented x 3.  SKIN: No obvious rash, lesion, or ulcer.     LABORATORY PANEL:   CBC Recent Labs  Lab 01/01/18 0524  WBC 4.6  HGB 13.1  HCT 38.6  PLT 289   ------------------------------------------------------------------------------------------------------------------  Chemistries  Recent Labs  Lab 12/31/17 1812 01/01/18 0524 01/02/18 0558  NA 141 141  --   K 3.6 3.2* 4.0  CL 104 103  --   CO2 22 23  --   GLUCOSE 132* 189*  --   BUN 16 18  --   CREATININE 0.84 0.86  --   CALCIUM 10.1 9.8  --   MG 2.0  --   --    ------------------------------------------------------------------------------------------------------------------  Cardiac Enzymes No results for input(s): TROPONINI in the last 168 hours. ------------------------------------------------------------------------------------------------------------------  RADIOLOGY:  Dg Chest 2 View  Result Date: 12/31/2017 CLINICAL DATA:  Cough, congestion, wheezing and shortness of breath for 5 days, history asthma, hypertension EXAM: CHEST  2 VIEW COMPARISON:  09/22/2016 FINDINGS: Normal heart size, mediastinal contours, and pulmonary vascularity. Slightly increased chronic bronchitic changes and accentuation of perihilar markings. Lungs otherwise clear. No pleural effusion or pneumothorax. Bones unremarkable. IMPRESSION: Increased bronchitic changes and accentuation  of perihilar markings which could reflect worsened bronchitis or asthma. No definite acute infiltrate. Electronically Signed   By: Lavonia Dana M.D.   On: 12/31/2017 18:34     ASSESSMENT AND PLAN:   67 year old female with past medical history of asthma, hypertension, hyperlipidemia, hypothyroidism, obesity, obstructive sleep apnea, history of diverticulosis who presented to the hospital due to shortness of breath and noted to be acute respiratory failure with hypoxia secondary to flu.  1. Acute respiratory failure with hypoxia-secondary to asthma exacerbation and flu. -Continue IV steroids, cont. DuoNeb nebs, Pulmicort nebs. Continue O2 supplementation. - cont. Robitussin, Tessalon Pearls and continue Tamiflu for the fl  2. Flu-patient is positive for influenza A. -Continue droplet precautions, Tamiflu. Cont. Tessalon Perles, Robitussin. -Continue further treatment as mentioned above and pt. Is slowly improving.   3. Hypothyroidism-continue Synthroid.  4. Essential hypertension-continue lisinopril  5. GERD-continue Protonix.  6. Hypokalemia-improved with supplementation, magnesium level is normal.  7. Depression-cont. Prozac.    All the records are reviewed and case discussed with Care Management/Social Worker. Management plans discussed with the patient, family and they are in agreement.  CODE STATUS: Full code  DVT Prophylaxis: Lovenox  TOTAL TIME TAKING CARE OF THIS PATIENT: 25 minutes.   POSSIBLE D/C IN 1-2 DAYS, DEPENDING ON CLINICAL CONDITION.   Henreitta Leber M.D on 01/02/2018 at 1:12 PM  Between 7am to 6pm - Pager - [phone]  After 6pm go to [email or web address] - password [email or web address] [place] [facility]  Office  [phone]  CC: Primary care physician; Lavera Guise, MD

## 2018-01-02 NOTE — Progress Notes (Signed)
Patient complained of heartburns. Dr. Jannifer Franklin notified. New order put in at this time

## 2018-01-03 MED ORDER — ALPRAZOLAM 0.25 MG PO TABS
0.2500 mg | ORAL_TABLET | Freq: Three times a day (TID) | ORAL | Status: DC | PRN
  Administered 2018-01-03 – 2018-01-09 (×14): 0.25 mg via ORAL
  Filled 2018-01-03 (×14): qty 1

## 2018-01-03 NOTE — Progress Notes (Addendum)
[place] [facility] [place] NAME: Bonnie West    MR#:  [id number]  DATE OF BIRTH:  [birth year]  SUBJECTIVE:   Still has cough, shortness of breath and wheezing. Worse today.  On oxygen 2 L. REVIEW OF SYSTEMS:    Review of Systems  Constitutional: Negative for chills and fever.  HENT: Negative for congestion and tinnitus.   Eyes: Negative for blurred vision and double vision.  Respiratory: Positive for cough, shortness of breath and wheezing.   Cardiovascular: Negative for chest pain, orthopnea and PND.  Gastrointestinal: Negative for abdominal pain, diarrhea, nausea and vomiting.  Genitourinary: Negative for dysuria and hematuria.  Neurological: Negative for dizziness, sensory change and focal weakness.  Psychiatric/Behavioral: Negative for depression. The patient is nervous/anxious.   All other systems reviewed and are negative.   Nutrition: Heart Healthy Tolerating Diet: Yes Tolerating PT: Await Eval.     DRUG ALLERGIES:   Allergies  Allergen Reactions  . Advair Diskus [Fluticasone-Salmeterol] Other (See Comments)    Causes asthma attack   . Codeine   . Erythromycin Other (See Comments)    Stomach pain  . Morphine And Related Itching and Nausea And Vomiting  . Nsaids   . Sulfa Antibiotics Other (See Comments)    unknown    VITALS:  Blood pressure (!) 153/84, pulse 97, temperature 98.1 F (36.7 C), temperature source Oral, resp. rate 19, height 5\' 1"  (1.549 m), weight 154 lb 12.2 oz (70.2 kg), SpO2 97 %.  PHYSICAL EXAMINATION:   Physical Exam  GENERAL:  67 y.o.-year-old patient lying in bed in mild Resp. Distress.   EYES: Pupils equal, round, reactive to light and accommodation. No scleral icterus. Extraocular muscles intact.  HEENT: Head atraumatic, normocephalic. Oropharynx and nasopharynx clear.  NECK:  Supple, no jugular venous distention. No thyroid enlargement, no tenderness.  LUNGS: Diffuse wheezing, rhonchi  bilaterally. Good air entry bilaterally, positive use of accessory muscles.  CARDIOVASCULAR: S1, S2 normal. No murmurs, rubs, or gallops.  ABDOMEN: Soft, nontender, nondistended. Bowel sounds present. No organomegaly or mass.  EXTREMITIES: No cyanosis, clubbing or edema b/l.    NEUROLOGIC: Cranial nerves II through XII are intact. No focal Motor or sensory deficits b/l.   PSYCHIATRIC: The patient is alert and oriented x 3.  SKIN: No obvious rash, lesion, or ulcer.     LABORATORY PANEL:   CBC Recent Labs  Lab 01/01/18 0524  WBC 4.6  HGB 13.1  HCT 38.6  PLT 289   ------------------------------------------------------------------------------------------------------------------  Chemistries  Recent Labs  Lab 12/31/17 1812 01/01/18 0524 01/02/18 0558  NA 141 141  --   K 3.6 3.2* 4.0  CL 104 103  --   CO2 22 23  --   GLUCOSE 132* 189*  --   BUN 16 18  --   CREATININE 0.84 0.86  --   CALCIUM 10.1 9.8  --   MG 2.0  --   --    ------------------------------------------------------------------------------------------------------------------  Cardiac Enzymes No results for input(s): TROPONINI in the last 168 hours. ------------------------------------------------------------------------------------------------------------------  RADIOLOGY:  No results found.   ASSESSMENT AND PLAN:   67 year old female with past medical history of asthma, hypertension, hyperlipidemia, hypothyroidism, obesity, obstructive sleep apnea, history of diverticulosis who presented to the hospital due to shortness of breath and noted to be acute respiratory failure with hypoxia secondary to flu.  1. Acute respiratory failure with hypoxia-secondary to asthma exacerbation and flu. -Continue IV steroids, cont. DuoNeb nebs every 6 hours, Pulmicort nebs.  Continue O2 supplementation. - cont. Robitussin, Tessalon Pearls and continue Tamiflu for the fl Pulmonary consult.  2. Flu-patient is positive for  influenza A. -Continue droplet precautions, Tamiflu. Cont. Tessalon Perles, Robitussin. -Continue further treatment as mentioned above and pt. Is slowly improving.   3. Hypothyroidism-continue Synthroid.  4. Essential hypertension-continue lisinopril  5. GERD-continue Protonix.  6. Hypokalemia-improved with supplementation, magnesium level is normal.  7. Depression-cont. Prozac.   Anxiety.  Xanax as needed.  All the records are reviewed and case discussed with Care Management/Social Worker. Management plans discussed with the patient, her husband and they are in agreement.  CODE STATUS: Full code  DVT Prophylaxis: Lovenox  TOTAL TIME TAKING CARE OF THIS PATIENT: 36 minutes.   POSSIBLE D/C IN 2-3 DAYS, DEPENDING ON CLINICAL CONDITION.   Demetrios Loll M.D on 01/03/2018 at 2:18 PM  Between 7am to 6pm - Pager - [phone]  After 6pm go to [email or web address] - password [email or web address] [facility]  Office  [phone]  CC: Primary care physician; Lavera Guise, MD

## 2018-01-04 NOTE — Progress Notes (Signed)
Date: 01/04/2018,   MRN# [id number] Bonnie West 22-Jan-1951 Code Status:     Code Status Orders  (From admission, onward)        Start     Ordered   12/31/17 2142  Full code  Continuous     12/31/17 2141    Code Status History    Date Active Date Inactive Code Status Order ID Comments User Context   This patient has a current code status but no historical code status.     Hosp day:@LENGTHOFSTAYDAYS @ Referring MD: @ATDPROV @       CC: + flu and bronchospastic  HPI: This is a 67 year old lady. She came to [place] wheezing and short of breath. She was on antibiotics and prednisone. On w/u influenza A screen wqas positive. She carries the hx of sleep apnea and asthma. Since being here she is less sob, wheezing and cough.   PMHX:   Past Medical History:  Diagnosis Date  . Allergic rhinitis   . Anxiety   . Asthma   . Blood transfusion without reported diagnosis   . Depression   . Diverticulosis   . Dyspnea   . Esophagitis   . GERD (gastroesophageal reflux disease)   . Headache   . Heart murmur   . Hyperlipemia   . Hypertension   . Hypothyroidism   . Obesity   . Osteopenia   . Palpitations   . Pneumothorax   . Sleep apnea    Surgical Hx:  Past Surgical History:  Procedure Laterality Date  . BUNIONECTOMY    . CATARACT EXTRACTION W/PHACO Right 10/11/2016   Procedure: CATARACT EXTRACTION PHACO AND INTRAOCULAR LENS PLACEMENT (IOC);  Surgeon: Birder Robson, MD;  Location: [facility] ORS;  Service: Ophthalmology;  Laterality: Right;  Lot# [id number] H [place]: 00:37.7 AP%: 18.1 CDE: 6.80  . CATARACT EXTRACTION W/PHACO Left 11/08/2016   Procedure: CATARACT EXTRACTION PHACO AND INTRAOCULAR LENS PLACEMENT (IOC);  Surgeon: Birder Robson, MD;  Location: [facility] ORS;  Service: Ophthalmology;  Laterality: Left;  PACK LOT: [id number] H US:00:32 AP:44 CDE:6.46  . COLONOSCOPY    . COLONOSCOPY WITH PROPOFOL N/A 11/26/2015   Procedure: COLONOSCOPY WITH PROPOFOL;  Surgeon: Manya Silvas, MD;   Location: [facility] ENDOSCOPY;  Service: Endoscopy;  Laterality: N/A;  . EYE SURGERY    . TONSILLECTOMY     Family Hx:  Family History  Problem Relation Age of Onset  . Breast cancer Maternal Grandmother   . Stroke Maternal Grandmother   . Bladder Cancer Father   . Prostate cancer Father   . Heart attack Father   . Aortic aneurysm Father   . Congestive Heart Failure Father   . Colon cancer Paternal Grandmother   . Aneurysm Paternal Grandmother   . Stroke Paternal Grandmother   . Congestive Heart Failure Paternal Grandmother   . Dementia Mother   . Stroke Mother   . Dementia Maternal Grandfather   . Stroke Maternal Grandfather   . Dementia Paternal Grandfather   . Stroke Paternal Grandfather    Social Hx:   Social History   Tobacco Use  . Smoking status: Former Research scientist (life sciences)  . Smokeless tobacco: Never Used  Substance Use Topics  . Alcohol use: No  . Drug use: No   Medication:    Home Medication:    Current Medication: @CURMEDTAB @   Allergies:  Advair diskus [fluticasone-salmeterol]; Codeine; Erythromycin; Morphine and related; Nsaids; and Sulfa antibiotics  Review of Systems: Gen:  Denies  fever, sweats, chills HEENT: Denies blurred vision, double vision,  ear pain, eye pain, hearing loss, nose bleeds, sore throat Cvc:  No dizziness, chest pain or heaviness Resp:  Less sob, cough  Gi: Denies swallowing difficulty, stomach pain, nausea or vomiting, diarrhea, constipation, bowel incontinence Gu:  Denies bladder incontinence, burning urine Ext:   No Joint pain, stiffness or swelling Skin: No skin rash, easy bruising or bleeding or hives Endoc:  No polyuria, polydipsia , polyphagia or weight change Psych: No depression, insomnia or hallucinations  Other:  All other systems negative  Physical Examination:   VS: BP (!) 148/95 (BP Location: Right Arm)   Pulse 77   Temp 97.9 F (36.6 C) (Oral)   Resp 19   Ht 5\' 1"  (1.549 m)   Wt 70.2 kg (154 lb 12.2 oz)   SpO2 95%   BMI  29.24 kg/m   General Appearance: No distress  Neuro: without focal findings, mental status, speech normal, alert and oriented, cranial nerves 2-12 intact, reflexes normal and symmetric, sensation grossly normal  HEENT: PERRLA, EOM intact, no ptosis, no other lesions noticed, NECK: Supple, no stridor Pulmonary:.No wheezing, No rales  Sputum Production:   Cardiovascular:  Normal S1,S2.  No m/r/g.  .    Abdomen:Benign, Soft, non-tender, No masses, hepatosplenomegaly, No lymphadenopathy Endoc: No evident thyromegaly, no signs of acromegaly or Cushing features Skin:   warm, no rashes, no ecchymosis  Extremities: normal, no cyanosis, clubbing, no edema, warm with normal capillary refill. Other findings:     Rad results:   CLINICAL DATA:  Cough, congestion, wheezing and shortness of breath for 5 days, history asthma, hypertension  EXAM: CHEST  2 VIEW  COMPARISON:  09/22/2016  FINDINGS: Normal heart size, mediastinal contours, and pulmonary vascularity.  Slightly increased chronic bronchitic changes and accentuation of perihilar markings.  Lungs otherwise clear.  No pleural effusion or pneumothorax.  Bones unremarkable.  IMPRESSION: Increased bronchitic changes and accentuation of perihilar markings which could reflect worsened bronchitis or asthma.  No definite acute infiltrate.   Electronically Signed   By: Lavonia Dana M.D.   On: 12/31/2017 18:34    Assessment and Plan: Influenza A positive, asthma flared up as a result. Slowly getting better.   Following Stay as is     I have personally obtained a history, examined the patient, evaluated laboratory and imaging results, formulated the assessment and plan and placed orders.  The Patient requires high complexity decision making for assessment and support, frequent evaluation and titration of therapies, application of advanced monitoring technologies and extensive interpretation of multiple databases.    Keron Neenan,M.D. Pulmonary & Critical care Medicine [facility]

## 2018-01-04 NOTE — Progress Notes (Signed)
[place] at [place] NAME: Bonnie West    MR#:  [id number]  DATE OF BIRTH:  07-12-1951  SUBJECTIVE:   Still has cough, shortness of breath and wheezing. better today.  On oxygen 2 L. REVIEW OF SYSTEMS:    Review of Systems  Constitutional: Negative for chills and fever.  HENT: Negative for congestion and tinnitus.   Eyes: Negative for blurred vision and double vision.  Respiratory: Positive for cough, shortness of breath and wheezing.   Cardiovascular: Negative for chest pain, orthopnea and PND.  Gastrointestinal: Negative for abdominal pain, diarrhea, nausea and vomiting.  Genitourinary: Negative for dysuria and hematuria.  Neurological: Negative for dizziness, sensory change and focal weakness.  Psychiatric/Behavioral: Negative for depression. The patient is nervous/anxious.   All other systems reviewed and are negative.   Nutrition: Heart Healthy Tolerating Diet: Yes Tolerating PT: Await Eval.     DRUG ALLERGIES:   Allergies  Allergen Reactions  . Advair Diskus [Fluticasone-Salmeterol] Other (See Comments)    Causes asthma attack   . Codeine   . Erythromycin Other (See Comments)    Stomach pain  . Morphine And Related Itching and Nausea And Vomiting  . Nsaids   . Sulfa Antibiotics Other (See Comments)    unknown    VITALS:  Blood pressure 133/79, pulse 94, temperature 98.1 F (36.7 C), temperature source Oral, resp. rate 16, height 5\' 1"  (1.549 m), weight 154 lb 12.2 oz (70.2 kg), SpO2 96 %.  PHYSICAL EXAMINATION:   Physical Exam  GENERAL:  67 y.o.-year-old patient lying in bed in mild Resp. Distress.   EYES: Pupils equal, round, reactive to light and accommodation. No scleral icterus. Extraocular muscles intact.  HEENT: Head atraumatic, normocephalic. Oropharynx and nasopharynx clear.  NECK:  Supple, no jugular venous distention. No thyroid enlargement, no tenderness.  LUNGS: Expiratory wheezing and rhonchi  bilaterally. Good air entry bilaterally, positive use of accessory muscles.  CARDIOVASCULAR: S1, S2 normal. No murmurs, rubs, or gallops.  ABDOMEN: Soft, nontender, nondistended. Bowel sounds present. No organomegaly or mass.  EXTREMITIES: No cyanosis, clubbing or edema b/l.    NEUROLOGIC: Cranial nerves II through XII are intact. No focal Motor or sensory deficits b/l.   PSYCHIATRIC: The patient is alert and oriented x 3.  SKIN: No obvious rash, lesion, or ulcer.     LABORATORY PANEL:   CBC Recent Labs  Lab 01/01/18 0524  WBC 4.6  HGB 13.1  HCT 38.6  PLT 289   ------------------------------------------------------------------------------------------------------------------  Chemistries  Recent Labs  Lab 12/31/17 1812 01/01/18 0524 01/02/18 0558  NA 141 141  --   K 3.6 3.2* 4.0  CL 104 103  --   CO2 22 23  --   GLUCOSE 132* 189*  --   BUN 16 18  --   CREATININE 0.84 0.86  --   CALCIUM 10.1 9.8  --   MG 2.0  --   --    ------------------------------------------------------------------------------------------------------------------  Cardiac Enzymes No results for input(s): TROPONINI in the last 168 hours. ------------------------------------------------------------------------------------------------------------------  RADIOLOGY:  No results found.   ASSESSMENT AND PLAN:   67 year old female with past medical history of asthma, hypertension, hyperlipidemia, hypothyroidism, obesity, obstructive sleep apnea, history of diverticulosis who presented to the hospital due to shortness of breath and noted to be acute respiratory failure with hypoxia secondary to flu.  1. Acute respiratory failure with hypoxia-secondary to asthma exacerbation and flu. -Continue IV steroids, cont. DuoNeb nebs every 6 hours, Pulmicort nebs.  Continue O2 supplementation. - cont. Robitussin, Tessalon Pearls and continue Tamiflu for the fl Pulmonary consult, per Dr. Raul Del, Stay as is.  2.  Flu-patient is positive for influenza A. -Continue droplet precautions, Tamiflu. Cont. Tessalon Perles, Robitussin. -Continue treatment as mentioned above and pt. Is slowly improving.   3. Hypothyroidism-continue Synthroid.  4. Essential hypertension-continue lisinopril  5. GERD-continue Protonix.  6. Hypokalemia-improved with supplementation, magnesium level is normal.  7. Depression-cont. Prozac.   Anxiety.  Xanax as needed.  All the records are reviewed and case discussed with Care Management/Social Worker. Management plans discussed with the patient, her husband and they are in agreement.  CODE STATUS: Full code  DVT Prophylaxis: Lovenox  TOTAL TIME TAKING CARE OF THIS PATIENT: 33 minutes.   POSSIBLE D/C IN 2 DAYS, DEPENDING ON CLINICAL CONDITION.   Demetrios Loll M.D on 01/04/2018 at 3:18 PM  Between 7am to 6pm - Pager - [phone]  After 6pm go to [email or web address] - password [email or web address] [facility]  Office  [phone]  CC: Primary care physician; Lavera Guise, MD

## 2018-01-05 ENCOUNTER — Inpatient Hospital Stay: Payer: Medicare Other

## 2018-01-05 LAB — CREATININE, SERUM
Creatinine, Ser: 0.87 mg/dL (ref 0.44–1.00)
GFR calc non Af Amer: >60 mL/min (ref >60)

## 2018-01-05 LAB — CBC
HCT: 40.3 % (ref 35.0–47.0)
Hemoglobin: 13.7 g/dL (ref 12.0–16.0)
MCH: 31.2 pg (ref 26.0–34.0)
MCHC: 34 g/dL (ref 32.0–36.0)
MCV: 91.7 fL (ref 80.0–100.0)
Platelets: 376 10*3/uL (ref 150–440)
RBC: 4.39 MIL/uL (ref 3.80–5.20)
RDW: 13 % (ref 11.5–14.5)
WBC: 14.6 10*3/uL — AB (ref 3.6–11.0)

## 2018-01-05 MED ORDER — BISACODYL 5 MG PO TBEC
10.0000 mg | DELAYED_RELEASE_TABLET | Freq: Every day | ORAL | Status: DC | PRN
  Administered 2018-01-05: 10 mg via ORAL
  Filled 2018-01-05: qty 2

## 2018-01-05 NOTE — Plan of Care (Signed)
Pt continuing to receive duo nebs and solu medrol. Requesting prn cough and anxiety meds

## 2018-01-05 NOTE — Care Management Important Message (Signed)
Important Message  Patient Details  Name: Bonnie West MRN: [id number] Date of Birth: Jan 01, 1951   Medicare Important Message Given:  Yes    Beverly Sessions, RN 01/05/2018, 5:16 PM

## 2018-01-05 NOTE — Progress Notes (Signed)
[place] at [place] NAME: Bonnie West    MR#:  [id number]  DATE OF BIRTH:  09/23/1951  SUBJECTIVE:   better cough, shortness of breath and wheezing.  Worsening shortness of breath during talking.  Still on oxygen 2 L.  REVIEW OF SYSTEMS:    Review of Systems  Constitutional: Negative for chills and fever.  HENT: Negative for congestion and tinnitus.   Eyes: Negative for blurred vision and double vision.  Respiratory: Positive for cough, shortness of breath and wheezing.   Cardiovascular: Negative for chest pain, orthopnea and PND.  Gastrointestinal: Negative for abdominal pain, diarrhea, nausea and vomiting.  Genitourinary: Negative for dysuria and hematuria.  Neurological: Negative for dizziness, sensory change and focal weakness.  Psychiatric/Behavioral: Negative for depression. The patient is nervous/anxious.   All other systems reviewed and are negative.   Nutrition: Heart Healthy Tolerating Diet: Yes Tolerating PT: Await Eval.     DRUG ALLERGIES:   Allergies  Allergen Reactions  . Advair Diskus [Fluticasone-Salmeterol] Other (See Comments)    Causes asthma attack   . Codeine   . Erythromycin Other (See Comments)    Stomach pain  . Morphine And Related Itching and Nausea And Vomiting  . Nsaids   . Sulfa Antibiotics Other (See Comments)    unknown    VITALS:  Blood pressure (!) 144/81, pulse 89, temperature 98.2 F (36.8 C), temperature source Oral, resp. rate 20, height 5\' 1"  (1.549 m), weight 154 lb 12.2 oz (70.2 kg), SpO2 96 %.  PHYSICAL EXAMINATION:   Physical Exam  GENERAL:  67 y.o.-year-old patient lying in bed in mild Resp. Distress.   EYES: Pupils equal, round, reactive to light and accommodation. No scleral icterus. Extraocular muscles intact.  HEENT: Head atraumatic, normocephalic. Oropharynx and nasopharynx clear.  NECK:  Supple, no jugular venous distention. No thyroid enlargement, no tenderness.   LUNGS: Expiratory wheezing and rhonchi bilaterally. Good air entry bilaterally, positive use of accessory muscles.  CARDIOVASCULAR: S1, S2 normal. No murmurs, rubs, or gallops.  ABDOMEN: Soft, nontender, nondistended. Bowel sounds present. No organomegaly or mass.  EXTREMITIES: No cyanosis, clubbing or edema b/l.    NEUROLOGIC: Cranial nerves II through XII are intact. No focal Motor or sensory deficits b/l.   PSYCHIATRIC: The patient is alert and oriented x 3.  SKIN: No obvious rash, lesion, or ulcer.     LABORATORY PANEL:   CBC Recent Labs  Lab 01/05/18 0508  WBC 14.6*  HGB 13.7  HCT 40.3  PLT 376   ------------------------------------------------------------------------------------------------------------------  Chemistries  Recent Labs  Lab 12/31/17 1812 01/01/18 0524 01/02/18 0558 01/05/18 0508  NA 141 141  --   --   K 3.6 3.2* 4.0  --   CL 104 103  --   --   CO2 22 23  --   --   GLUCOSE 132* 189*  --   --   BUN 16 18  --   --   CREATININE 0.84 0.86  --  0.87  CALCIUM 10.1 9.8  --   --   MG 2.0  --   --   --    ------------------------------------------------------------------------------------------------------------------  Cardiac Enzymes No results for input(s): TROPONINI in the last 168 hours. ------------------------------------------------------------------------------------------------------------------  RADIOLOGY:  No results found.   ASSESSMENT AND PLAN:   67 year old female with past medical history of asthma, hypertension, hyperlipidemia, hypothyroidism, obesity, obstructive sleep apnea, history of diverticulosis who presented to the hospital due to shortness of breath and  noted to be acute respiratory failure with hypoxia secondary to flu.  1. Acute respiratory failure with hypoxia-secondary to asthma exacerbation and flu. -Continue IV steroids, cont. DuoNeb nebs every 6 hours, Pulmicort nebs. Continue O2 supplementation. - cont. Robitussin,  Tessalon Pearls and finished Tamiflu today. Pulmonary consult, per Dr. Raul Del, Stay as is.  2. Flu-patient is positive for influenza A. -Continue droplet precautions,  finished Tamiflu today. Tessalon Perles, Robitussin. -Continue treatment as mentioned above and pt. Is slowly improving.   3. Hypothyroidism-continue Synthroid.  4. Essential hypertension-continue lisinopril  5. GERD-continue Protonix.  6. Hypokalemia-improved with supplementation, magnesium level is normal.  7. Depression-cont. Prozac.   Anxiety.  Xanax as needed.  All the records are reviewed and case discussed with Care Management/Social Worker. Management plans discussed with the patient, her husband and they are in agreement.  CODE STATUS: Full code  DVT Prophylaxis: Lovenox  TOTAL TIME TAKING CARE OF THIS PATIENT: 28 minutes.   POSSIBLE D/C IN 2 DAYS, DEPENDING ON CLINICAL CONDITION.   Demetrios Loll M.D on 01/05/2018 at 2:53 PM  Between 7am to 6pm - Pager - [phone]  After 6pm go to [email or web address] - password [email or web address] [facility]  Office  [phone]  CC: Primary care physician; Lavera Guise, MD

## 2018-01-06 ENCOUNTER — Inpatient Hospital Stay: Payer: Medicare Other

## 2018-01-06 MED ORDER — DICLOFENAC SODIUM 1 % TD GEL
4.0000 g | Freq: Four times a day (QID) | TRANSDERMAL | Status: DC | PRN
  Administered 2018-01-08: 4 g via TOPICAL
  Filled 2018-01-06: qty 100

## 2018-01-06 NOTE — Progress Notes (Signed)
Patient ID: Bonnie West, female   DOB: 12/08/1950, 67 y.o.   MRN: [id number]  [facility] PROGRESS NOTE  GERMAINE RIPP LZJ:[id number] DOB: [birth year] DOA: 12/31/2017 PCP: Lavera Guise, MD  HPI/Subjective: Patient feeling okay.  Still wheezing and still coughing a lot.  Yesterday evening had a fall in the bathroom slipped on her gown.  Her shoulder and thumb hurt.  Objective: Vitals:   01/06/18 0927 01/06/18 1419  BP: 126/75 (!) 158/83  Pulse:  (!) 105  Resp:  (!) 24  Temp:  100.1 F (37.8 C)  SpO2:  95%    Filed Weights   12/31/17 1800 12/31/17 2134  Weight: 73.5 kg (162 lb) 70.2 kg (154 lb 12.2 oz)    ROS: Review of Systems  Constitutional: Negative for chills and fever.  Eyes: Negative for blurred vision.  Respiratory: Positive for cough, shortness of breath and wheezing.   Cardiovascular: Negative for chest pain.  Gastrointestinal: Negative for abdominal pain, constipation, diarrhea, nausea and vomiting.  Genitourinary: Negative for dysuria.  Musculoskeletal: Positive for joint pain.  Neurological: Negative for dizziness and headaches.   Exam: Physical Exam  Constitutional: She is oriented to person, place, and time.  HENT:  Nose: No mucosal edema.  Mouth/Throat: No oropharyngeal exudate or posterior oropharyngeal edema.  Eyes: Conjunctivae, EOM and lids are normal. Pupils are equal, round, and reactive to light.  Neck: No JVD present. Carotid bruit is not present. No edema present. No thyroid mass and no thyromegaly present.  Cardiovascular: S1 normal and S2 normal. Exam reveals no gallop.  No murmur heard. Pulses:      Dorsalis pedis pulses are 2+ on the right side, and 2+ on the left side.  Respiratory: No respiratory distress. She has decreased breath sounds in the right middle field, the right lower field, the left middle field and the left lower field. She has wheezes in the right middle field, the right lower field, the left middle field and the left  lower field. She has no rhonchi. She has no rales.  GI: Soft. Bowel sounds are normal. There is no tenderness.  Musculoskeletal:       Right shoulder: She exhibits tenderness.  Right thumb also painful to palpation.  Slight bruising on the right thumb.  Lymphadenopathy:    She has no cervical adenopathy.  Neurological: She is alert and oriented to person, place, and time. No cranial nerve deficit.  Skin: Skin is warm. No rash noted. Nails show no clubbing.  Psychiatric: She has a normal mood and affect.      Data Reviewed: Basic Metabolic Panel: Recent Labs  Lab 12/31/17 1812 01/01/18 0524 01/02/18 0558 01/05/18 0508  NA 141 141  --   --   K 3.6 3.2* 4.0  --   CL 104 103  --   --   CO2 22 23  --   --   GLUCOSE 132* 189*  --   --   BUN 16 18  --   --   CREATININE 0.84 0.86  --  0.87  CALCIUM 10.1 9.8  --   --   MG 2.0  --   --   --    CBC: Recent Labs  Lab 12/31/17 1812 01/01/18 0524 01/05/18 0508  WBC 5.9 4.6 14.6*  HGB 14.5 13.1 13.7  HCT 42.4 38.6 40.3  MCV 92.3 92.5 91.7  PLT 295 289 376     Studies: Dg Shoulder Right  Result Date: 01/06/2018 CLINICAL DATA:  Slip and fall, pain. EXAM: RIGHT SHOULDER - 2+ VIEW COMPARISON:  None. FINDINGS: The humeral head is well-formed and located. The subacromial, glenohumeral and acromioclavicular joint spaces are intact. No destructive bony lesions. Soft tissue planes are non-suspicious. IMPRESSION: Negative. Electronically Signed   By: Elon Alas M.D.   On: 01/06/2018 01:08   Dg Hand Complete Right  Result Date: 01/06/2018 CLINICAL DATA:  Right hand/thumb pain after fall going to the bathroom tonight. EXAM: RIGHT HAND - COMPLETE 3+ VIEW COMPARISON:  None. FINDINGS: There is no evidence of fracture or dislocation. 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 Soft tissues are unremarkable. IMPRESSION: No fracture or subluxation of the right hand. Electronically  Signed   By: Jeb Levering M.D.   On: 01/06/2018 00:03    Scheduled Meds: . amLODipine  5 mg Oral Daily  . budesonide (PULMICORT) nebulizer solution  0.5 mg Nebulization BID  . docusate sodium  100 mg Oral BID  . enoxaparin (LOVENOX) injection  40 mg Subcutaneous Q24H  . fenofibrate  160 mg Oral Daily  . FLUoxetine  40 mg Oral Daily  . ipratropium-albuterol  3 mL Nebulization Q6H  . levothyroxine  100 mcg Oral BH-q7a  . lisinopril  20 mg Oral Daily  . loratadine  10 mg Oral Daily  . magnesium oxide  400 mg Oral Daily  . methylPREDNISolone (SOLU-MEDROL) injection  40 mg Intravenous Q6H  . montelukast  10 mg Oral QHS  . pantoprazole  40 mg Oral Daily  . sodium chloride flush  3 mL Intravenous Q12H   Continuous Infusions: . sodium chloride      Assessment/Plan:  1. Acute hypoxic respiratory failure.  Patient was finally tapered off the oxygen today. 2. Asthma exacerbation and influenza A positive.  Continue steroids.  Patient still wheezing and would likely need another day in the hospital.  Continue nebulizer treatments. 3. Hypothyroidism unspecified on levothyroxine 4. Essential hypertension on lisinopril 5. GERD on Protonix 6. Hypokalemia.  Replaced during the hospital course 7. Anxiety and depression on Prozac and Xanax 8. Right shoulder pain and thumb pain from fall yesterday.  Continue to monitor.  X-rays were negative for fracture  Code Status:     Code Status Orders  (From admission, onward)        Start     Ordered   12/31/17 2142  Full code  Continuous     12/31/17 2141    Code Status History    Date Active Date Inactive Code Status Order ID Comments User Context   This patient has a current code status but no historical code status.      Disposition Plan: Potentially home tomorrow  Time spent: 28 minutes  [place]

## 2018-01-07 LAB — CREATININE, SERUM
CREATININE: 0.97 mg/dL (ref 0.44–1.00)
GFR calc Af Amer: >60 mL/min (ref >60)
GFR calc non Af Amer: 59 mL/min — ABNORMAL LOW (ref >60)

## 2018-01-07 NOTE — Progress Notes (Signed)
Patient ID: Bonnie West, female   DOB: 1951/11/02, 67 y.o.   MRN: [id number]  [facility] PROGRESS NOTE  Bonnie West EVO:[id number] DOB: 1951-03-16 DOA: 12/31/2017 PCP: Lavera Guise, MD  HPI/Subjective: Feels better than when she came into the hospital.  Still with cough and wheeze and shortness of breath.  Patient unable to talk in complete sentences without coughing.  Unable to take a deep breath without coughing.  Objective: Vitals:   01/07/18 0819 01/07/18 1300  BP:  139/73  Pulse:  88  Resp:  19  Temp:  98 F (36.7 C)  SpO2: 97% 98%    Filed Weights   12/31/17 1800 12/31/17 2134  Weight: 73.5 kg (162 lb) 70.2 kg (154 lb 12.2 oz)    ROS: Review of Systems  Constitutional: Negative for chills and fever.  Eyes: Negative for blurred vision.  Respiratory: Positive for cough, shortness of breath and wheezing.   Cardiovascular: Negative for chest pain.  Gastrointestinal: Negative for abdominal pain, constipation, diarrhea, nausea and vomiting.  Genitourinary: Negative for dysuria.  Musculoskeletal: Positive for joint pain.  Neurological: Negative for dizziness and headaches.   Exam: Physical Exam  Constitutional: She is oriented to person, place, and time.  HENT:  Nose: No mucosal edema.  Mouth/Throat: No oropharyngeal exudate or posterior oropharyngeal edema.  Eyes: Conjunctivae, EOM and lids are normal. Pupils are equal, round, and reactive to light.  Neck: No JVD present. Carotid bruit is not present. No edema present. No thyroid mass and no thyromegaly present.  Cardiovascular: S1 normal and S2 normal. Exam reveals no gallop.  No murmur heard. Pulses:      Dorsalis pedis pulses are 2+ on the right side, and 2+ on the left side.  Respiratory: No respiratory distress. She has decreased breath sounds in the right middle field, the right lower field, the left middle field and the left lower field. She has wheezes in the right middle field, the right lower field,  the left middle field and the left lower field. She has no rhonchi. She has no rales.  Coughs with deep breath.  With lots of talking triggers coughing.  GI: Soft. Bowel sounds are normal. There is no tenderness.  Musculoskeletal:       Right shoulder: She exhibits tenderness.  Right thumb also painful to palpation.  Slight bruising on the right thumb.  Lymphadenopathy:    She has no cervical adenopathy.  Neurological: She is alert and oriented to person, place, and time. No cranial nerve deficit.  Skin: Skin is warm. No rash noted. Nails show no clubbing.  Psychiatric: She has a normal mood and affect.      Data Reviewed: Basic Metabolic Panel: Recent Labs  Lab 12/31/17 1812 01/01/18 0524 01/02/18 0558 01/05/18 0508 01/07/18 0526  NA 141 141  --   --   --   K 3.6 3.2* 4.0  --   --   CL 104 103  --   --   --   CO2 22 23  --   --   --   GLUCOSE 132* 189*  --   --   --   BUN 16 18  --   --   --   CREATININE 0.84 0.86  --  0.87 0.97  CALCIUM 10.1 9.8  --   --   --   MG 2.0  --   --   --   --    CBC: Recent Labs  Lab 12/31/17 1812  01/01/18 0524 01/05/18 0508  WBC 5.9 4.6 14.6*  HGB 14.5 13.1 13.7  HCT 42.4 38.6 40.3  MCV 92.3 92.5 91.7  PLT 295 289 376     Studies: Dg Shoulder Right  Result Date: 01/06/2018 CLINICAL DATA:  Slip and fall, pain. EXAM: RIGHT SHOULDER - 2+ VIEW COMPARISON:  None. FINDINGS: The humeral head is well-formed and located. The subacromial, glenohumeral and acromioclavicular joint spaces are intact. No destructive bony lesions. Soft tissue planes are non-suspicious. IMPRESSION: Negative. Electronically Signed   By: Elon Alas M.D.   On: 01/06/2018 01:08   Dg Hand Complete Right  Result Date: 01/06/2018 CLINICAL DATA:  Right hand/thumb pain after fall going to the bathroom tonight. EXAM: RIGHT HAND - COMPLETE 3+ VIEW COMPARISON:  None. FINDINGS: There is no evidence of fracture or dislocation. Mild scattered osteoarthritis throughout the  digits. Short fifth digit middle phalanx, congenital. Soft tissues are unremarkable. IMPRESSION: No fracture or subluxation of the right hand. Electronically Signed   By: Jeb Levering M.D.   On: 01/06/2018 00:03    Scheduled Meds: . amLODipine  5 mg Oral Daily  . budesonide (PULMICORT) nebulizer solution  0.5 mg Nebulization BID  . docusate sodium  100 mg Oral BID  . enoxaparin (LOVENOX) injection  40 mg Subcutaneous Q24H  . fenofibrate  160 mg Oral Daily  . FLUoxetine  40 mg Oral Daily  . ipratropium-albuterol  3 mL Nebulization Q6H  . levothyroxine  100 mcg Oral BH-q7a  . lisinopril  20 mg Oral Daily  . loratadine  10 mg Oral Daily  . magnesium oxide  400 mg Oral Daily  . methylPREDNISolone (SOLU-MEDROL) injection  40 mg Intravenous Q6H  . montelukast  10 mg Oral QHS  . pantoprazole  40 mg Oral Daily  . sodium chloride flush  3 mL Intravenous Q12H   Continuous Infusions: . sodium chloride      Assessment/Plan:  1. Acute hypoxic respiratory failure.  Patient tapered off oxygen yesterday 2. Asthma exacerbation and influenza A positive.  Continue steroids.  Patient still wheezing and coughing with talking and/or taking a deep breath.  Continue nebulizer treatments.  Continue to monitor on daily basis on when to go home. 3. Hypothyroidism unspecified on levothyroxine 4. Essential hypertension on lisinopril 5. GERD on Protonix 6. Hypokalemia.  Replaced during the hospital course 7. Anxiety and depression on Prozac and Xanax 8. Right shoulder pain and thumb pain from fall yesterday.  Continue to monitor.  X-rays were negative for fracture  Code Status:     Code Status Orders  (From admission, onward)        Start     Ordered   12/31/17 2142  Full code  Continuous     12/31/17 2141    Code Status History    Date Active Date Inactive Code Status Order ID Comments User Context   This patient has a current code status but no historical code status.      Disposition  Plan: Reevaluate daily on when to go home  Time spent: 26 minutes  [place]

## 2018-01-08 ENCOUNTER — Inpatient Hospital Stay: Payer: Medicare Other

## 2018-01-08 NOTE — Care Management Important Message (Signed)
Important Message  Patient Details  Name: Bonnie West MRN: [id number] Date of Birth: Aug 23, 1951   Medicare Important Message Given:  Yes    Beverly Sessions, RN 01/08/2018, 4:39 PM

## 2018-01-08 NOTE — Progress Notes (Signed)
Patient ID: Bonnie West, female   DOB: [birth year], 67 y.o.   MRN: [id number]  [facility] PROGRESS NOTE  Bonnie West DOB: 10-Sep-1951 DOA: 12/31/2017 PCP: Lavera Guise, MD  HPI/Subjective: Patient still feels that she is coughing when she is trying to talk and take a deep breath.  Still feels short of breath.  Still has a little wheeze.   Objective: Vitals:   01/08/18 1301 01/08/18 1457  BP: 139/72   Pulse: 89   Resp: 20   Temp: 98 F (36.7 C)   SpO2: 95% 96%    Filed Weights   12/31/17 1800 12/31/17 2134  Weight: 73.5 kg (162 lb) 70.2 kg (154 lb 12.2 oz)    ROS: Review of Systems  Constitutional: Negative for chills and fever.  Eyes: Negative for blurred vision.  Respiratory: Positive for cough, shortness of breath and wheezing.   Cardiovascular: Negative for chest pain.  Gastrointestinal: Negative for abdominal pain, constipation, diarrhea, nausea and vomiting.  Genitourinary: Negative for dysuria.  Musculoskeletal: Positive for joint pain.  Neurological: Negative for dizziness and headaches.   Exam: Physical Exam  Constitutional: She is oriented to person, place, and time.  HENT:  Nose: No mucosal edema.  Mouth/Throat: No oropharyngeal exudate or posterior oropharyngeal edema.  Eyes: Conjunctivae, EOM and lids are normal. Pupils are equal, round, and reactive to light.  Neck: No JVD present. Carotid bruit is not present. No edema present. No thyroid mass and no thyromegaly present.  Cardiovascular: S1 normal and S2 normal. Exam reveals no gallop.  No murmur heard. Pulses:      Dorsalis pedis pulses are 2+ on the right side, and 2+ on the left side.  Respiratory: No respiratory distress. She has decreased breath sounds in the right middle field, the right lower field, the left middle field and the left lower field. She has wheezes in the right middle field, the right lower field, the left middle field and the left lower field. She has no rhonchi.  She has no rales.  Coughs with deep breath.  With lots of talking triggers coughing.  GI: Soft. Bowel sounds are normal. There is no tenderness.  Musculoskeletal:       Right shoulder: She exhibits tenderness.  Right thumb also painful to palpation.  Slight bruising on the right thumb.  Lymphadenopathy:    She has no cervical adenopathy.  Neurological: She is alert and oriented to person, place, and time. No cranial nerve deficit.  Skin: Skin is warm. No rash noted. Nails show no clubbing.  Psychiatric: She has a normal mood and affect.      Data Reviewed: Basic Metabolic Panel: Recent Labs  Lab 01/02/18 0558 01/05/18 0508 01/07/18 0526  K 4.0  --   --   CREATININE  --  0.87 0.97   CBC: Recent Labs  Lab 01/05/18 0508  WBC 14.6*  HGB 13.7  HCT 40.3  MCV 91.7  PLT 376     Studies: Dg Chest 2 View  Result Date: 01/08/2018 CLINICAL DATA:  Cough and asthma.  hypertension. EXAM: CHEST  2 VIEW COMPARISON:  December 31, 2017 FINDINGS: No edema or consolidation. The heart size and pulmonary vascularity are normal. No adenopathy. No evident bone lesions. IMPRESSION: No edema or consolidation. Electronically Signed   By: Lowella Grip III M.D.   On: 01/08/2018 08:18    Scheduled Meds: . amLODipine  5 mg Oral Daily  . budesonide (PULMICORT) nebulizer solution  0.5 mg Nebulization BID  .  docusate sodium  100 mg Oral BID  . enoxaparin (LOVENOX) injection  40 mg Subcutaneous Q24H  . fenofibrate  160 mg Oral Daily  . FLUoxetine  40 mg Oral Daily  . ipratropium-albuterol  3 mL Nebulization Q6H  . levothyroxine  100 mcg Oral BH-q7a  . lisinopril  20 mg Oral Daily  . loratadine  10 mg Oral Daily  . magnesium oxide  400 mg Oral Daily  . methylPREDNISolone (SOLU-MEDROL) injection  40 mg Intravenous Q6H  . montelukast  10 mg Oral QHS  . pantoprazole  40 mg Oral Daily  . sodium chloride flush  3 mL Intravenous Q12H   Continuous Infusions: . sodium chloride       Assessment/Plan:  1. Acute hypoxic respiratory failure.  Patient tapered off oxygen. 2. Asthma exacerbation and influenza A positive.  Continue steroids.  Patient still wheezing and coughing with talking and/or taking a deep breath.  Continue nebulizer treatments.  Continue to monitor on daily basis on when to go home.  I still do not feel comfortable sending this patient home yet. 3. Hypothyroidism unspecified on levothyroxine 4. Essential hypertension on lisinopril 5. GERD on Protonix 6. Hypokalemia.  Replaced during the hospital course 7. Anxiety and depression on Prozac and Xanax 8. Right shoulder pain and thumb pain from fall yesterday.  Continue to monitor.  X-rays were negative for fracture  Code Status:     Code Status Orders  (From admission, onward)        Start     Ordered   12/31/17 2142  Full code  Continuous     12/31/17 2141    Code Status History    Date Active Date Inactive Code Status Order ID Comments User Context   This patient has a current code status but no historical code status.      Disposition Plan: Reevaluate daily on when to go home  Time spent: 24 minutes  [place]

## 2018-01-09 MED ORDER — ALBUTEROL SULFATE (2.5 MG/3ML) 0.083% IN NEBU: 2.5000 mg | INHALATION_SOLUTION | Freq: Four times a day (QID) | RESPIRATORY_TRACT | 0 refills | Status: DC | PRN

## 2018-01-09 MED ORDER — HYDROCOD POLST-CPM POLST ER 10-8 MG/5ML PO SUER: 5.0000 mL | Freq: Two times a day (BID) | ORAL | 0 refills | Status: DC | PRN

## 2018-01-09 MED ORDER — ALPRAZOLAM 0.25 MG PO TABS: 0.2500 mg | ORAL_TABLET | Freq: Two times a day (BID) | ORAL | Status: DC | PRN

## 2018-01-09 MED ORDER — FLUTICASONE PROPIONATE HFA 110 MCG/ACT IN AERO: 1.0000 | INHALATION_SPRAY | Freq: Two times a day (BID) | RESPIRATORY_TRACT | 0 refills | Status: DC

## 2018-01-09 MED ORDER — HYDROCOD POLST-CPM POLST ER 10-8 MG/5ML PO SUER
5.0000 mL | Freq: Two times a day (BID) | ORAL | Status: DC | PRN
  Administered 2018-01-09: 5 mL via ORAL
  Filled 2018-01-09: qty 5

## 2018-01-09 MED ORDER — ALBUTEROL SULFATE HFA 108 (90 BASE) MCG/ACT IN AERS: 2.0000 | INHALATION_SPRAY | Freq: Four times a day (QID) | RESPIRATORY_TRACT | 0 refills | Status: DC | PRN

## 2018-01-09 MED ORDER — ALPRAZOLAM 0.25 MG PO TABS: 0.2500 mg | ORAL_TABLET | Freq: Two times a day (BID) | ORAL | 0 refills | Status: DC | PRN

## 2018-01-09 MED ORDER — PREDNISONE 10 MG PO TABS: ORAL_TABLET | ORAL | 0 refills | Status: DC

## 2018-01-09 NOTE — Progress Notes (Signed)
Nutrition Brief Note  Patient identified for LOS  Wt Readings from Last 15 Encounters:  12/31/17 154 lb 12.2 oz (70.2 kg)  11/28/17 165 lb (74.8 kg)  11/27/17 163 lb (73.9 kg)  10/24/17 160 lb 8 oz (72.8 kg)  09/22/17 164 lb (74.4 kg)  07/25/17 162 lb (73.5 kg)  07/07/17 163 lb (73.9 kg)  06/22/17 162 lb (73.5 kg)  06/15/17 160 lb (72.6 kg)  06/14/17 160 lb (72.6 kg)  06/12/17 161 lb (73 kg)  05/01/17 161 lb (73 kg)  11/08/16 164 lb (74.4 kg)  10/11/16 164 lb (74.4 kg)  11/26/15 155 lb (70.3 kg)    Body mass index is 29.24 kg/m. Patient meets criteria for normal weight based on current BMI.   Current diet order is regular, patient is consuming approximately 100% of meals at this time. Labs and medications reviewed.   No nutrition interventions warranted at this time. If nutrition issues arise, please consult RD.   Koleen Distance MS, RD, LDN Pager #- [phone] After Hours Pager: [phone]

## 2018-01-09 NOTE — Discharge Summary (Signed)
[facility] NAME: Bonnie West    MR#:  [id number]  DATE OF BIRTH:  1951/08/05  DATE OF ADMISSION:  12/31/2017 ADMITTING PHYSICIAN: Demetrios Loll, MD  DATE OF DISCHARGE: 01/09/2018  3:44 PM  PRIMARY CARE PHYSICIAN: Lavera Guise, MD    ADMISSION DIAGNOSIS:  Flu [J11.1] Exacerbation of asthma, unspecified asthma severity, unspecified whether persistent [J45.901]  DISCHARGE DIAGNOSIS:  Active Problems:   Acute respiratory failure with hypoxia ([place])   SECONDARY DIAGNOSIS:   Past Medical History:  Diagnosis Date  . Allergic rhinitis   . Anxiety   . Asthma   . Blood transfusion without reported diagnosis   . Depression   . Diverticulosis   . Dyspnea   . Esophagitis   . GERD (gastroesophageal reflux disease)   . Headache   . Heart murmur   . Hyperlipemia   . Hypertension   . Hypothyroidism   . Obesity   . Osteopenia   . Palpitations   . Pneumothorax   . Sleep apnea     HOSPITAL COURSE:   1.  Cute hypoxic respiratory failure.  The patient was on oxygen most of the hospital course.  A few days prior to discharge she was tapered off oxygen. 2.  Asthma exacerbation and influenza A being positive.  The patient was on steroids during the entire hospital course.  The patient had wheezing and coughing with talking and taking a deep breath.  She took a long time to progress.  She finally felt like she was turning the corner and wanted to go home.  Since she has been on steroids for a while I did give a longer taper to go home with.  She finished her entire Tamiflu course in the hospital.  She has a nebulizer at home and I prescribed her nebulizer treatment and albuterol inhaler.  She also has for cough medication with codeine 3.  Hypothyroidism unspecified on levothyroxine 4.  Essential hypertension on lisinopril 5.  GERD on Protonix 6.  Hypokalemia this was replaced during the hospital course 7.  Anxiety depression on Prozac and  Xanax 8.  Patient had a fall in the hospital and had some right shoulder and thumb pain.  X-rays were negative for fracture  DISCHARGE CONDITIONS:   Satisfactory  CONSULTS OBTAINED:  Treatment Team:  Erby Pian, MD  DRUG ALLERGIES:   Allergies  Allergen Reactions  . Advair Diskus [Fluticasone-Salmeterol] Other (See Comments)    Causes asthma attack   . Codeine   . Erythromycin Other (See Comments)    Stomach pain  . Morphine And Related Itching and Nausea And Vomiting  . Nsaids   . Sulfa Antibiotics Other (See Comments)    unknown    DISCHARGE MEDICATIONS:   Allergies as of 01/09/2018      Reactions   Advair Diskus [fluticasone-salmeterol] Other (See Comments)   Causes asthma attack   Codeine    Erythromycin Other (See Comments)   Stomach pain   Morphine And Related Itching, Nausea And Vomiting   Nsaids    Sulfa Antibiotics Other (See Comments)   unknown      Medication List    STOP taking these medications   predniSONE 10 MG (21) Tbpk tablet Commonly known as:  STERAPRED UNI-PAK 21 TAB Replaced by:  predniSONE 10 MG tablet     TAKE these medications   acetaminophen 500 MG tablet Commonly known as:  TYLENOL Take 1,000 mg by mouth 2 (two)  times daily as needed for mild pain or moderate pain.   albuterol 108 (90 Base) MCG/ACT inhaler Commonly known as:  PROVENTIL HFA;VENTOLIN HFA Inhale 2 puffs into the lungs every 6 (six) hours as needed for wheezing or shortness of breath. What changed:    when to take this  reasons to take this   albuterol (2.5 MG/3ML) 0.083% nebulizer solution Commonly known as:  PROVENTIL Take 3 mLs (2.5 mg total) by nebulization every 6 (six) hours as needed for wheezing or shortness of breath. What changed:  You were already taking a medication with the same name, and this prescription was added. Make sure you understand how and when to take each.   ALPRAZolam 0.25 MG tablet Commonly known as:  XANAX Take 1 tablet  (0.25 mg total) by mouth 2 (two) times daily as needed for anxiety.   amLODipine 5 MG tablet Commonly known as:  NORVASC Take 5 mg by mouth daily.   azelastine 0.1 % nasal spray Commonly known as:  ASTELIN Place 1 spray into both nostrils at bedtime as needed for rhinitis or allergies. Use in each nostril as directed   betamethasone valerate ointment 0.1 % Commonly known as:  VALISONE Apply a pea sized amount topically bid x 1-2 weeks as needed What changed:    how much to take  how to take this  when to take this  reasons to take this  additional instructions   bisacodyl 5 MG EC tablet Commonly known as:  DULCOLAX Take 5-10 mg by mouth at bedtime as needed for moderate constipation.   cetirizine 10 MG tablet Commonly known as:  ZYRTEC Take 10 mg by mouth daily.   chlorpheniramine-HYDROcodone 10-8 MG/5ML Suer Commonly known as:  TUSSIONEX Take 5 mLs by mouth every 12 (twelve) hours as needed for cough.   cholecalciferol 1000 units tablet Commonly known as:  VITAMIN D Take 1,000 Units by mouth at bedtime.   clindamycin 1 % external solution Commonly known as:  CLEOCIN T Apply 1 application topically 2 (two) times daily as needed. For skin breakdown   cyclobenzaprine 5 MG tablet Commonly known as:  FLEXERIL Take 5 mg by mouth 3 (three) times daily as needed for muscle spasms.   docusate sodium 100 MG capsule Commonly known as:  COLACE Take 100-200 mg by mouth at bedtime as needed for mild constipation or moderate constipation.   estradiol 0.1 MG/GM vaginal cream Commonly known as:  ESTRACE 1 gram vaginally every night for one week, then twice weekly at bedtime What changed:    how much to take  how to take this  when to take this  additional instructions   fenofibrate 160 MG tablet Take 160 mg by mouth daily.   FLUoxetine 40 MG capsule Commonly known as:  PROZAC Take 40 mg by mouth daily.   fluticasone 110 MCG/ACT inhaler Commonly known as:   FLOVENT HFA Inhale 1 puff into the lungs 2 (two) times daily. What changed:  when to take this   Hydrocodone-Acetaminophen 5-300 MG Tabs Take 1 tablet by mouth 2 (two) times daily as needed. For severe pain   ipratropium 17 MCG/ACT inhaler Commonly known as:  ATROVENT HFA Inhale 2 puffs into the lungs 2 (two) times daily.   isometheptene-acetaminophen-dichloralphenazone 65-100-325 MG capsule Commonly known as:  MIDRIN Take 1 capsule by mouth 4 (four) times daily as needed for migraine. Maximum 5 capsules in 12 hours for migraine headaches, 8 capsules in 24 hours for tension headaches.   levothyroxine  100 MCG tablet Commonly known as:  SYNTHROID, LEVOTHROID Take 100 mcg by mouth every morning.   lisinopril 20 MG tablet Commonly known as:  PRINIVIL,ZESTRIL Take 1 tablet (20 mg total) by mouth daily. What changed:  when to take this   magnesium oxide 400 MG tablet Commonly known as:  MAG-OX Take 400 mg by mouth at bedtime.   montelukast 10 MG tablet Commonly known as:  SINGULAIR Take 1 tablet (10 mg total) by mouth at bedtime. What changed:  when to take this   omeprazole 40 MG capsule Commonly known as:  PRILOSEC Take 40 mg by mouth daily.   predniSONE 10 MG tablet Commonly known as:  DELTASONE 4 tabs po day1; 3 tabs po day2,3; 2 tabs po day4,5; 1 tab po day6,7; 1/2 tab po day8,9 Replaces:  predniSONE 10 MG (21) Tbpk tablet   triamcinolone cream 0.1 % Commonly known as:  KENALOG Apply 1 application topically 2 (two) times daily as needed. For skin breakdown   vitamin B-12 1000 MCG tablet Commonly known as:  CYANOCOBALAMIN Take 1,000 mcg by mouth daily.            Durable Medical Equipment  (From admission, onward)        Start     Ordered   01/09/18 1049  For home use only DME Nebulizer machine  Once    Question:  Patient needs a nebulizer to treat with the following condition  Answer:  Asthma in adult, severe persistent, with acute exacerbation   01/09/18  1049       DISCHARGE INSTRUCTIONS:   Follow-up PMD 1 week  If you experience worsening of your admission symptoms, develop shortness of breath, life threatening emergency, suicidal or homicidal thoughts you must seek medical attention immediately by calling 911 or calling your MD immediately  if symptoms less severe.  You Must read complete instructions/literature along with all the possible adverse reactions/side effects for all the Medicines you take and that have been prescribed to you. Take any new Medicines after you have completely understood and accept all the possible adverse reactions/side effects.   Please note  You were cared for by a hospitalist during your hospital stay. If you have any questions about your discharge medications or the care you received while you were in the hospital after you are discharged, you can call the unit and asked to speak with the hospitalist on call if the hospitalist that took care of you is not available. Once you are discharged, your primary care physician will handle any further medical issues. Please note that NO REFILLS for any discharge medications will be authorized once you are discharged, as it is imperative that you return to your primary care physician (or establish a relationship with a primary care physician if you do not have one) for your aftercare needs so that they can reassess your need for medications and monitor your lab values.    Today   CHIEF COMPLAINT:   Chief Complaint  Patient presents with  . Shortness of Breath    HISTORY OF PRESENT ILLNESS:  Bonnie West  is a 67 y.o. female came in with shortness of breath   VITAL SIGNS:  Blood pressure (!) 159/82, pulse 92, temperature 98.2 F (36.8 C), temperature source Oral, resp. rate 18, height 5\' 1"  (1.549 m), weight 70.2 kg (154 lb 12.2 oz), SpO2 95 %.    PHYSICAL EXAMINATION:  GENERAL:  67 y.o.-year-old patient lying in the bed with no acute distress.  EYES:  Pupils equal, round, reactive to light and accommodation. No scleral icterus. Extraocular muscles intact.  HEENT: Head atraumatic, normocephalic. Oropharynx and nasopharynx clear.  NECK:  Supple, no jugular venous distention. No thyroid enlargement, no tenderness.  LUNGS: Decreased breath sounds bilaterally,  scattered expiratory wheeze.  No rales,rhonchi or crepitation. No use of accessory muscles of respiration.  CARDIOVASCULAR: S1, S2 normal. No murmurs, rubs, or gallops.  ABDOMEN: Soft, non-tender, non-distended. Bowel sounds present. No organomegaly or mass.  EXTREMITIES: No pedal edema, cyanosis, or clubbing.  NEUROLOGIC: Cranial nerves II through XII are intact. Muscle strength 5/5 in all extremities. Sensation intact. Gait not checked.  PSYCHIATRIC: The patient is alert and oriented x 3.  SKIN: No obvious rash, lesion, or ulcer.   DATA REVIEW:   CBC Recent Labs  Lab 01/05/18 0508  WBC 14.6*  HGB 13.7  HCT 40.3  PLT 376    Chemistries  Recent Labs  Lab 01/07/18 0526  CREATININE 0.97     RADIOLOGY:  Dg Chest 2 View  Result Date: 01/08/2018 CLINICAL DATA:  Cough and asthma.  hypertension. EXAM: CHEST  2 VIEW COMPARISON:  December 31, 2017 FINDINGS: No edema or consolidation. The heart size and pulmonary vascularity are normal. No adenopathy. No evident bone lesions. IMPRESSION: No edema or consolidation. Electronically Signed   By: Lowella Grip III M.D.   On: 01/08/2018 08:18      Management plans discussed with the patient, family and they are in agreement.  CODE STATUS:     Code Status Orders  (From admission, onward)        Start     Ordered   12/31/17 2142  Full code  Continuous     12/31/17 2141    Code Status History    Date Active Date Inactive Code Status Order ID Comments User Context   This patient has a current code status but no historical code status.      TOTAL TIME TAKING CARE OF THIS PATIENT: 32 minutes.    Loletha Grayer M.D on  01/09/2018 at 4:48 PM  Between 7am to 6pm - Pager - [phone]  After 6pm go to [email or web address] - password [email or web address] [facility]  [phone]  CC: Primary care physician; Lavera Guise, MD

## 2018-01-09 NOTE — Care Management (Signed)
Patient to discharge with nebulizer solutions.  RNCM confirmed with patient that she has a working nebulizer machine in the home.

## 2018-01-09 NOTE — Progress Notes (Signed)
Patient cleared for discharge per MD [place]. AVS printed. Prescriptions given. IV removed. Patient verbalized understanding of discharge instructions. All questions answered. Patient discharged to home.

## 2018-01-15 ENCOUNTER — Other Ambulatory Visit: Payer: Self-pay

## 2018-01-15 NOTE — Patient Outreach (Signed)
[facility]) Care Management  01/15/2018  Bonnie West 1951-04-11 [id number]  EMMI: General discharge Referral date: 01/15/18 Referral source:  EMMI general discharge red alert Referral reason: Questions about discharge papers, know who to call about changes in condition, scheduled follow up, questions/ problems.  Day # 1  Telephone call to patient regarding EMMI general discharge red alert. HIPAA verified with patient. Discussed EMMI general discharge with patient. Patient states she was discharged on Friday 01/12/18. Patient states the inside of her mouth was peeling a few hours after she got home from being discharged. Patient states she called [address] back at the hospital to ask about the peeling of her mouth. Patient states, " I was told that I was no longer a patient in the hospital and I needed to call my doctor. I thought the person was very rude."  RNCM advised patient to call patient experience department to report her concerns. RNCM provided patient main contact number to the hospital and instructed patient to ask for patient experience department.  Patient states, " I am feeling a little better its just slow going."  Patient reports she is still having some peeling in her mouth. RNCM advised patient to contact her doctors office today to let them know that she was recently discharged from the hospital and she is having peeling in her mouth. RNCM discussed importance of patient scheduling a follow up appointment with her primary MD. Patient states she already has appointments scheduled with primary primary MD and pulmonologist. Patient states she saw the appointments listed on her discharge instructions. Patient states her pulmonologist handles her asthma condition. RNCM discussed importance of her having an earlier appointment with her doctor due to being in the hospital. Patient verbalized understanding.  Patient states she has all of her medications and is taking them as  prescribed.  Patient states she has transportation to her appointments. Patient reports her only new medication from the hospital is a cough medication. Patient states medication seems to be helping.  RNCM advised patient to call her doctor for any increase in shortness of breath, wheezing.  If symptoms worsen call 911.  RNCM advised patient to notify MD of any changes in condition prior to scheduled appointment. RNCM provided contact name and number: [phone] or main office number [phone] and 24 hour nurse advise line [phone].  RNCM verified patient aware of 911 services for urgent/ emergent needs.    PLAN: RNCM will refer patient to care management assistant to close due to patient being assessed and having no further needs.  RNCM will send closure notification to patients primary MD   Quinn Plowman RN,BSN,CCM [facility] Telephonic  [phone]

## 2018-01-24 ENCOUNTER — Other Ambulatory Visit: Payer: Self-pay

## 2018-01-24 MED ORDER — FLUOXETINE HCL 40 MG PO CAPS: 40.0000 mg | ORAL_CAPSULE | Freq: Every day | ORAL | 3 refills | Status: DC

## 2018-01-24 MED ORDER — LISINOPRIL 10 MG PO TABS: 10.0000 mg | ORAL_TABLET | Freq: Every day | ORAL | 5 refills | Status: DC

## 2018-01-30 ENCOUNTER — Ambulatory Visit (INDEPENDENT_AMBULATORY_CARE_PROVIDER_SITE_OTHER): Payer: Medicare Other | Admitting: Internal Medicine

## 2018-01-30 ENCOUNTER — Encounter: Payer: Self-pay | Admitting: Internal Medicine

## 2018-01-30 VITALS — BP 148/91 | HR 78 | Resp 16 | Ht 61.0 in | Wt 159.8 lb

## 2018-01-30 DIAGNOSIS — L719 Rosacea, unspecified: Secondary | ICD-10-CM | POA: Diagnosis not present

## 2018-01-30 DIAGNOSIS — G43709 Chronic migraine without aura, not intractable, without status migrainosus: Secondary | ICD-10-CM | POA: Diagnosis not present

## 2018-01-30 DIAGNOSIS — E785 Hyperlipidemia, unspecified: Secondary | ICD-10-CM | POA: Insufficient documentation

## 2018-01-30 DIAGNOSIS — E039 Hypothyroidism, unspecified: Secondary | ICD-10-CM | POA: Diagnosis not present

## 2018-01-30 DIAGNOSIS — J45909 Unspecified asthma, uncomplicated: Secondary | ICD-10-CM | POA: Insufficient documentation

## 2018-01-30 DIAGNOSIS — I10 Essential (primary) hypertension: Secondary | ICD-10-CM | POA: Diagnosis not present

## 2018-01-30 DIAGNOSIS — E669 Obesity, unspecified: Secondary | ICD-10-CM | POA: Insufficient documentation

## 2018-01-30 MED ORDER — CLINDAMYCIN PHOSPHATE 1 % EX SOLN: 1.0000 "application " | Freq: Two times a day (BID) | CUTANEOUS | 2 refills | Status: DC | PRN

## 2018-01-30 MED ORDER — HYDROCODONE-ACETAMINOPHEN 5-300 MG PO TABS: 1.0000 | ORAL_TABLET | Freq: Two times a day (BID) | ORAL | 0 refills | Status: DC | PRN

## 2018-01-30 NOTE — Progress Notes (Signed)
[facility] [place], [place] [address]  Internal MEDICINE  Office Visit Note  Patient Name: Bonnie West  [id number]  [id number]  Date of Service: 01/30/2018  Chief Complaint  Patient presents with  . Hospitalization Follow-up    flu and asthma    HPI  Pt is here for routine follow up. Pt has been dealing with severe asthma and has been in the hospital few times. She is still on tapering dose of prednisone. She continues to have back pain and rib cage pain. She is still getting migraine headaches Worries about rosacea. BP is elevated.    Current Medication: Outpatient Encounter Medications as of 01/30/2018  Medication Sig  . acetaminophen (TYLENOL) 500 MG tablet Take 1,000 mg by mouth 2 (two) times daily as needed for mild pain or moderate pain.  Marland Kitchen albuterol (PROVENTIL HFA;VENTOLIN HFA) 108 (90 Base) MCG/ACT inhaler Inhale 2 puffs into the lungs every 6 (six) hours as needed for wheezing or shortness of breath.  Marland Kitchen albuterol (PROVENTIL) (2.5 MG/3ML) 0.083% nebulizer solution Take 3 mLs (2.5 mg total) by nebulization every 6 (six) hours as needed for wheezing or shortness of breath.  . ALPRAZolam (XANAX) 0.25 MG tablet Take 1 tablet (0.25 mg total) by mouth 2 (two) times daily as needed for anxiety.  Marland Kitchen amLODipine (NORVASC) 5 MG tablet Take 5 mg by mouth daily.  Marland Kitchen azelastine (ASTELIN) 0.1 % nasal spray Place 1 spray into both nostrils at bedtime as needed for rhinitis or allergies. Use in each nostril as directed  . betamethasone valerate ointment (VALISONE) 0.1 % Apply a pea sized amount topically bid x 1-2 weeks as needed (Patient taking differently: Apply 1 application topically 2 (two) times daily as needed. Apply a pea sized amount topically bid x 1-2 weeks as needed for skin breakdown)  . bisacodyl (DULCOLAX) 5 MG EC tablet Take 5-10 mg by mouth at bedtime as needed for moderate constipation.  . cetirizine (ZYRTEC) 10 MG tablet Take 10 mg by mouth daily.   . chlorpheniramine-HYDROcodone (TUSSIONEX) 10-8 MG/5ML SUER Take 5 mLs by mouth every 12 (twelve) hours as needed for cough.  . cholecalciferol (VITAMIN D) 1000 units tablet Take 1,000 Units by mouth at bedtime.  . clindamycin (CLEOCIN T) 1 % external solution Apply 1 application topically 2 (two) times daily as needed. For skin breakdown  . cyclobenzaprine (FLEXERIL) 5 MG tablet Take 5 mg by mouth 3 (three) times daily as needed for muscle spasms.  Marland Kitchen docusate sodium (COLACE) 100 MG capsule Take 100-200 mg by mouth at bedtime as needed for mild constipation or moderate constipation.   Marland Kitchen estradiol (ESTRACE) 0.1 MG/GM vaginal cream 1 gram vaginally every night for one week, then twice weekly at bedtime (Patient taking differently: Place 1 Applicatorful vaginally once a week. 1 gram vaginally every night for one week, then twice weekly at bedtime)  . fenofibrate 160 MG tablet Take 160 mg by mouth daily.  Marland Kitchen FLUoxetine (PROZAC) 40 MG capsule Take 1 capsule (40 mg total) by mouth daily.  . fluticasone (FLOVENT HFA) 110 MCG/ACT inhaler Inhale 1 puff into the lungs 2 (two) times daily.  . Hydrocodone-Acetaminophen 5-300 MG TABS Take 1 tablet by mouth 2 (two) times daily as needed. For severe pain  . ipratropium (ATROVENT HFA) 17 MCG/ACT inhaler Inhale 2 puffs into the lungs 2 (two) times daily.   Marland Kitchen isometheptene-acetaminophen-dichloralphenazone (MIDRIN) 65-100-325 MG capsule Take 1 capsule by mouth 4 (four) times daily as needed for migraine. Maximum  5 capsules in 12 hours for migraine headaches, 8 capsules in 24 hours for tension headaches.  . levothyroxine (SYNTHROID, LEVOTHROID) 100 MCG tablet Take 100 mcg by mouth every morning.  Marland Kitchen lisinopril (PRINIVIL,ZESTRIL) 10 MG tablet Take 1 tablet (10 mg total) by mouth daily.  . magnesium oxide (MAG-OX) 400 MG tablet Take 400 mg by mouth at bedtime.   . montelukast (SINGULAIR) 10 MG tablet Take 1 tablet (10 mg total) by mouth at bedtime. (Patient taking  differently: Take 10 mg by mouth daily. )  . omeprazole (PRILOSEC) 40 MG capsule Take 40 mg by mouth daily.  . predniSONE (DELTASONE) 10 MG tablet 4 tabs po day1; 3 tabs po day2,3; 2 tabs po day4,5; 1 tab po day6,7; 1/2 tab po day8,9  . triamcinolone cream (KENALOG) 0.1 % Apply 1 application topically 2 (two) times daily as needed. For skin breakdown  . vitamin B-12 (CYANOCOBALAMIN) 1000 MCG tablet Take 1,000 mcg by mouth daily.   Facility-Administered Encounter Medications as of 01/30/2018  Medication  . fentaNYL (SUBLIMAZE) injection 25 mcg  . ondansetron (ZOFRAN) injection 4 mg    Surgical History: Past Surgical History:  Procedure Laterality Date  . BUNIONECTOMY    . CATARACT EXTRACTION W/PHACO Right 10/11/2016   Procedure: CATARACT EXTRACTION PHACO AND INTRAOCULAR LENS PLACEMENT (IOC);  Surgeon: Birder Robson, MD;  Location: [facility] ORS;  Service: Ophthalmology;  Laterality: Right;  Lot# [id number] H [place]: 00:37.7 AP%: 18.1 CDE: 6.80  . CATARACT EXTRACTION W/PHACO Left 11/08/2016   Procedure: CATARACT EXTRACTION PHACO AND INTRAOCULAR LENS PLACEMENT (IOC);  Surgeon: Birder Robson, MD;  Location: [facility] ORS;  Service: Ophthalmology;  Laterality: Left;  PACK LOT: [id number] H US:00:32 AP:44 CDE:6.46  . COLONOSCOPY    . COLONOSCOPY WITH PROPOFOL N/A 11/26/2015   Procedure: COLONOSCOPY WITH PROPOFOL;  Surgeon: Manya Silvas, MD;  Location: [facility] ENDOSCOPY;  Service: Endoscopy;  Laterality: N/A;  . EYE SURGERY    . TONSILLECTOMY      Medical History: Past Medical History:  Diagnosis Date  . Allergic rhinitis   . Anxiety   . Asthma   . Blood transfusion without reported diagnosis   . Depression   . Diverticulosis   . Dyspnea   . Esophagitis   . GERD (gastroesophageal reflux disease)   . Headache   . Heart murmur   . Hyperlipemia   . Hypertension   . Hypothyroidism   . Obesity   . Osteopenia   . Palpitations   . Pneumothorax   . Sleep apnea     Family History: Family  History  Problem Relation Age of Onset  . Breast cancer Maternal Grandmother   . Stroke Maternal Grandmother   . Bladder Cancer Father   . Prostate cancer Father   . Heart attack Father   . Aortic aneurysm Father   . Congestive Heart Failure Father   . Colon cancer Paternal Grandmother   . Aneurysm Paternal Grandmother   . Stroke Paternal Grandmother   . Congestive Heart Failure Paternal Grandmother   . Dementia Mother   . Stroke Mother   . Dementia Maternal Grandfather   . Stroke Maternal Grandfather   . Dementia Paternal Grandfather   . Stroke Paternal Grandfather     Social History   Socioeconomic History  . Marital status: Married    Spouse name: Not on file  . Number of children: Not on file  . Years of education: Not on file  . Highest education level: Not on file  Social Needs  .  Financial resource strain: Not on file  . Food insecurity - worry: Not on file  . Food insecurity - inability: Not on file  . Transportation needs - medical: Not on file  . Transportation needs - non-medical: Not on file  Occupational History  . Not on file  Tobacco Use  . Smoking status: Former Research scientist (life sciences)  . Smokeless tobacco: Never Used  Substance and Sexual Activity  . Alcohol use: No  . Drug use: No  . Sexual activity: No    Partners: Male    Birth control/protection: Post-menopausal  Other Topics Concern  . Not on file  Social History Narrative  . Not on file    Review of Systems  Constitutional: Negative for chills, fatigue and unexpected weight change.  HENT: Positive for postnasal drip. Negative for congestion, rhinorrhea, sneezing and sore throat.   Eyes: Negative for redness.  Respiratory: Negative for cough, chest tightness and shortness of breath.   Cardiovascular: Negative for chest pain and palpitations.  Gastrointestinal: Negative for abdominal pain, constipation, diarrhea, nausea and vomiting.  Genitourinary: Negative for dysuria and frequency.  Musculoskeletal:  Negative for arthralgias, back pain, joint swelling and neck pain.  Skin: Negative for rash.  Neurological: Negative.  Negative for tremors and numbness.  Hematological: Negative for adenopathy. Does not bruise/bleed easily.  Psychiatric/Behavioral: Negative for behavioral problems (Depression), sleep disturbance and suicidal ideas. The patient is not nervous/anxious.     Vital Signs: BP (!) 148/91 (BP Location: Left Arm, Patient Position: Sitting)   Pulse 78   Resp 16   Ht 5\' 1"  (1.549 m)   Wt 159 lb 12.8 oz (72.5 kg)   SpO2 98%   BMI 30.19 kg/m    Physical Exam  Constitutional: She is oriented to person, place, and time. She appears well-developed and well-nourished. No distress.  HENT:  Head: Normocephalic and atraumatic.  Mouth/Throat: Oropharynx is clear and moist. No oropharyngeal exudate.  Eyes: EOM are normal. Pupils are equal, round, and reactive to light.  Neck: Normal range of motion. Neck supple. No JVD present. No tracheal deviation present. No thyromegaly present.  Cardiovascular: Normal rate, regular rhythm and normal heart sounds. Exam reveals no gallop and no friction rub.  No murmur heard. Pulmonary/Chest: Effort normal. No respiratory distress. She has no wheezes. She has no rales. She exhibits no tenderness.  Abdominal: Soft. Bowel sounds are normal.  Musculoskeletal: Normal range of motion.  Lymphadenopathy:    She has no cervical adenopathy.  Neurological: She is alert and oriented to person, place, and time. No cranial nerve deficit.  Skin: Skin is warm and dry. She is not diaphoretic.  Psychiatric: She has a normal mood and affect. Her behavior is normal. Judgment and thought content normal.   Assessment/Plan: 1. Moderate asthma without complication, unspecified whether persistent - Per Pulmonary  2. Chronic migraine without aura without status migrainosus, not intractable - Prn use of  HYDROcodone-Acetaminophen 5-300 MG TABS; Take 1 tablet by mouth 2  (two) times daily as needed. For severe pain  Dispense: 15 each; Refill: 0  3. Essential hypertension, benign - Needs to control bp, continue norvasc   4. Rosacea, acne - clindamycin (CLEOCIN T) 1 % external solution; Apply 1 application topically 2 (two) times daily as needed. For skin breakdown  Dispense: 30 mL; Refill: 2  5. Hypothyroidism, unspecified type - Continue synthroid   General Counseling: Sarai verbalizes understanding of the findings of todays visit and agrees with plan of treatment. I have discussed any further diagnostic  evaluation that may be needed or ordered today. We also reviewed her medications today. she has been encouraged to call the office with any questions or concerns that should arise related to todays visit.    Time spent:25 Minutes   Dr Lavera Guise Internal medicine

## 2018-02-01 ENCOUNTER — Other Ambulatory Visit: Payer: Self-pay | Admitting: Internal Medicine

## 2018-02-12 ENCOUNTER — Other Ambulatory Visit: Payer: Self-pay | Admitting: Internal Medicine

## 2018-02-13 ENCOUNTER — Other Ambulatory Visit: Payer: Self-pay

## 2018-02-13 MED ORDER — FENOFIBRATE 160 MG PO TABS
160.0000 mg | ORAL_TABLET | Freq: Every day | ORAL | 1 refills | Status: DC
Start: 2018-02-13 — End: 2018-08-05

## 2018-02-16 ENCOUNTER — Other Ambulatory Visit: Payer: Self-pay

## 2018-02-16 MED ORDER — FLUOXETINE HCL 40 MG PO CAPS: 40.0000 mg | ORAL_CAPSULE | Freq: Every day | ORAL | 1 refills | Status: DC

## 2018-02-19 ENCOUNTER — Other Ambulatory Visit: Payer: Self-pay | Admitting: Nurse Practitioner

## 2018-02-19 MED ORDER — AMLODIPINE BESYLATE 5 MG PO TABS: 5.0000 mg | ORAL_TABLET | Freq: Every day | ORAL | 3 refills | Status: DC

## 2018-02-19 MED ORDER — LEVOTHYROXINE SODIUM 100 MCG PO TABS: 100.0000 ug | ORAL_TABLET | ORAL | 3 refills | Status: DC

## 2018-02-27 ENCOUNTER — Ambulatory Visit (INDEPENDENT_AMBULATORY_CARE_PROVIDER_SITE_OTHER): Payer: Medicare Other | Admitting: Obstetrics and Gynecology

## 2018-02-27 ENCOUNTER — Telehealth: Payer: Self-pay | Admitting: Obstetrics and Gynecology

## 2018-02-27 ENCOUNTER — Encounter: Payer: Self-pay | Admitting: Obstetrics and Gynecology

## 2018-02-27 ENCOUNTER — Other Ambulatory Visit: Payer: Self-pay

## 2018-02-27 VITALS — BP 132/80 | HR 84 | Resp 16 | Wt 156.0 lb

## 2018-02-27 DIAGNOSIS — M545 Low back pain, unspecified: Secondary | ICD-10-CM

## 2018-02-27 DIAGNOSIS — Z4689 Encounter for fitting and adjustment of other specified devices: Secondary | ICD-10-CM

## 2018-02-27 DIAGNOSIS — L678 Other hair color and hair shaft abnormalities: Secondary | ICD-10-CM

## 2018-02-27 NOTE — Telephone Encounter (Signed)
Patient forgot ask Dr.Jertson about her facial hair. Patient asked what can she use to prevent facial hair?

## 2018-02-27 NOTE — Progress Notes (Signed)
GYNECOLOGY  VISIT   HPI: 67 y.o.   Married  Caucasian  female   G1P1001 with No LMP recorded. Patient is postmenopausal.   here for follow up pessary. She has had a lot of back pain in the last 3 weeks, but she has a h/o chronic back pain. Wonders if her pessary is in correctly. No vaginal discomfort. No bleeding or abnormal d/c. She was having worsening urinary incontinence when she was sick. Better now. She has only leaked a few times in the last 6 weeks. She is using her estrogen cream 2 x a week.      She was hospitalized for 10 days in February from complications from the flu. She is feeling well now.   GYNECOLOGIC HISTORY: No LMP recorded. Patient is postmenopausal. Contraception:postmenopause  Menopausal hormone therapy: none         OB History    Gravida  1   Para  1   Term  1   Preterm      AB      Living  1     SAB      TAB      Ectopic      Multiple      Live Births  1              Patient Active Problem List   Diagnosis Date Noted  . Asthma without status asthmaticus 01/30/2018  . Hyperlipidemia, unspecified 01/30/2018  . Hypertension 01/30/2018  . Obesity, unspecified 01/30/2018  . Thyroid disease 01/30/2018  . Acute respiratory failure with hypoxia ([place]) 12/31/2017  . Fatty infiltration of liver 12/10/2015  . Abdominal pain, LLQ (left lower quadrant) 11/03/2015  . History of adenomatous polyp of colon 11/03/2015  . Hallux valgus, left 09/18/2014  . 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  . Shoulder arthritis 12/19/2013    Past Medical History:  Diagnosis Date  . Allergic rhinitis   . Anxiety   . Asthma   . Blood transfusion without reported diagnosis   . Depression   . Diverticulosis   . Dyspnea   . Esophagitis   . GERD (gastroesophageal reflux disease)   . Headache   . Heart murmur   . Hyperlipemia   . Hypertension   . Hypothyroidism   . Obesity   . Osteopenia   . Palpitations   . Pneumothorax   . Sleep apnea     Past  Surgical History:  Procedure Laterality Date  . BUNIONECTOMY    . CATARACT EXTRACTION W/PHACO Right 10/11/2016   Procedure: CATARACT EXTRACTION PHACO AND INTRAOCULAR LENS PLACEMENT (IOC);  Surgeon: Birder Robson, MD;  Location: [facility] ORS;  Service: Ophthalmology;  Laterality: Right;  Lot# [id number] H [place]: 00:37.7 AP%: 18.1 CDE: 6.80  . CATARACT EXTRACTION W/PHACO Left 11/08/2016   Procedure: CATARACT EXTRACTION PHACO AND INTRAOCULAR LENS PLACEMENT (IOC);  Surgeon: Birder Robson, MD;  Location: [facility] ORS;  Service: Ophthalmology;  Laterality: Left;  PACK LOT: [id number] H US:00:32 AP:44 CDE:6.46  . COLONOSCOPY    . COLONOSCOPY WITH PROPOFOL N/A 11/26/2015   Procedure: COLONOSCOPY WITH PROPOFOL;  Surgeon: Manya Silvas, MD;  Location[facility]COPY;  Service: Endoscopy;  Laterality: N/A;  . EYE SURGERY    . TONSILLECTOMY      Current Outpatient Medications  Medication Sig Dispense Refill  . acetaminophen (TYLENOL) 500 MG tablet Take 1,000 mg by mouth 2 (two) times daily as needed for mild pain or moderate pain.    Marland Kitchen albuterol (PROVENTIL HFA;VENTOLIN HFA) 108 (90 Base)  MCG/ACT inhaler Inhale 2 puffs into the lungs every 6 (six) hours as needed for wheezing or shortness of breath. 1 Inhaler 0  . albuterol (PROVENTIL) (2.5 MG/3ML) 0.083% nebulizer solution Take 3 mLs (2.5 mg total) by nebulization every 6 (six) hours as needed for wheezing or shortness of breath. 100 vial 0  . ALPRAZolam (XANAX) 0.25 MG tablet Take 1 tablet (0.25 mg total) by mouth 2 (two) times daily as needed for anxiety. 15 tablet 0  . amLODipine (NORVASC) 5 MG tablet Take 1 tablet (5 mg total) by mouth daily. 90 tablet 3  . azelastine (ASTELIN) 0.1 % nasal spray Place 1 spray into both nostrils at bedtime as needed for rhinitis or allergies. Use in each nostril as directed    . betamethasone valerate ointment (VALISONE) 0.1 % Apply a pea sized amount topically bid x 1-2 weeks as needed (Patient taking differently: Apply 1  application topically 2 (two) times daily as needed. Apply a pea sized amount topically bid x 1-2 weeks as needed for skin breakdown) 15 g 0  . cetirizine (ZYRTEC) 10 MG tablet Take 10 mg by mouth daily.    . chlorpheniramine-HYDROcodone (TUSSIONEX) 10-8 MG/5ML SUER Take 5 mLs by mouth every 12 (twelve) hours as needed for cough. 115 mL 0  . cholecalciferol (VITAMIN D) 1000 units tablet Take 1,000 Units by mouth at bedtime.    . clindamycin (CLEOCIN T) 1 % external solution Apply 1 application topically 2 (two) times daily as needed. For skin breakdown 30 mL 2  . cyclobenzaprine (FLEXERIL) 5 MG tablet Take 5 mg by mouth 3 (three) times daily as needed for muscle spasms.    Marland Kitchen docusate sodium (COLACE) 100 MG capsule Take 100-200 mg by mouth at bedtime as needed for mild constipation or moderate constipation.     Marland Kitchen estradiol (ESTRACE) 0.1 MG/GM vaginal cream 1 gram vaginally every night for one week, then twice weekly at bedtime (Patient taking differently: Place 1 Applicatorful vaginally once a week. 1 gram vaginally every night for one week, then twice weekly at bedtime) 42.5 g 1  . fenofibrate 160 MG tablet Take 1 tablet (160 mg total) by mouth daily. 90 tablet 1  . FLOVENT HFA 110 MCG/ACT inhaler TAKE 1 PUFF BY MOUTH TWICE A DAY 12 Inhaler 0  . FLUoxetine (PROZAC) 40 MG capsule Take 1 capsule (40 mg total) by mouth daily. 90 capsule 1  . HYDROcodone-Acetaminophen 5-300 MG TABS Take 1 tablet by mouth 2 (two) times daily as needed. For severe pain 15 each 0  . ipratropium (ATROVENT HFA) 17 MCG/ACT inhaler Inhale 2 puffs into the lungs 2 (two) times daily.     Marland Kitchen isometheptene-acetaminophen-dichloralphenazone (MIDRIN) 65-100-325 MG capsule Take 1 capsule by mouth 4 (four) times daily as needed for migraine. Maximum 5 capsules in 12 hours for migraine headaches, 8 capsules in 24 hours for tension headaches.    . levothyroxine (SYNTHROID, LEVOTHROID) 100 MCG tablet Take 1 tablet (100 mcg total) by mouth  every morning. 90 tablet 3  . lisinopril (PRINIVIL,ZESTRIL) 10 MG tablet Take 1 tablet (10 mg total) by mouth daily. 30 tablet 5  . magnesium oxide (MAG-OX) 400 MG tablet Take 400 mg by mouth at bedtime.     . montelukast (SINGULAIR) 10 MG tablet Take 1 tablet (10 mg total) by mouth at bedtime. (Patient taking differently: Take 10 mg by mouth daily. ) 90 tablet 3  . omeprazole (PRILOSEC) 40 MG capsule Take 40 mg by mouth daily.    Marland Kitchen  triamcinolone cream (KENALOG) 0.1 % Apply 1 application topically 2 (two) times daily as needed. For skin breakdown    . vitamin B-12 (CYANOCOBALAMIN) 1000 MCG tablet Take 1,000 mcg by mouth daily.    . bisacodyl (DULCOLAX) 5 MG EC tablet Take 5-10 mg by mouth at bedtime as needed for moderate constipation.     No current facility-administered medications for this visit.    Facility-Administered Medications Ordered in Other Visits  Medication Dose Route Frequency Provider Last Rate Last Dose  . fentaNYL (SUBLIMAZE) injection 25 mcg  25 mcg Intravenous Q5 min PRN Alvin Critchley, MD      . ondansetron [facility]) injection 4 mg  4 mg Intravenous Once PRN Alvin Critchley, MD         ALLERGIES: Advair diskus [fluticasone-salmeterol]; Codeine; Erythromycin; Morphine and related; Nsaids; and Sulfa antibiotics  Family History  Problem Relation Age of Onset  . Breast cancer Maternal Grandmother   . Stroke Maternal Grandmother   . Bladder Cancer Father   . Prostate cancer Father   . Heart attack Father   . Aortic aneurysm Father   . Congestive Heart Failure Father   . Colon cancer Paternal Grandmother   . Aneurysm Paternal Grandmother   . Stroke Paternal Grandmother   . Congestive Heart Failure Paternal Grandmother   . Dementia Mother   . Stroke Mother   . Dementia Maternal Grandfather   . Stroke Maternal Grandfather   . Dementia Paternal Grandfather   . Stroke Paternal Grandfather     Social History   Socioeconomic History  . Marital status: Married     Spouse name: Not on file  . Number of children: Not on file  . Years of education: Not on file  . Highest education level: Not on file  Occupational History  . Not on file  Social Needs  . Financial resource strain: Not on file  . Food insecurity:    Worry: Not on file    Inability: Not on file  . Transportation needs:    Medical: Not on file    Non-medical: Not on file  Tobacco Use  . Smoking status: Former Research scientist (life sciences)  . Smokeless tobacco: Never Used  Substance and Sexual Activity  . Alcohol use: No  . Drug use: No  . Sexual activity: Never    Partners: Male    Birth control/protection: Post-menopausal  Lifestyle  . Physical activity:    Days per week: Not on file    Minutes per session: Not on file  . Stress: Not on file  Relationships  . Social connections:    Talks on phone: Not on file    Gets together: Not on file    Attends religious service: Not on file    Active member of club or organization: Not on file    Attends meetings of clubs or organizations: Not on file    Relationship status: Not on file  . Intimate partner violence:    Fear of current or ex partner: Not on file    Emotionally abused: Not on file    Physically abused: Not on file    Forced sexual activity: Not on file  Other Topics Concern  . Not on file  Social History Narrative  . Not on file    Review of Systems  Constitutional: Negative.   HENT: Negative.   Eyes: Negative.   Respiratory: Negative.   Cardiovascular: Negative.   Gastrointestinal: Positive for diarrhea.  Genitourinary: Positive for frequency.  Night urination  Loss of urine with sneeze   Musculoskeletal: Negative.   Skin: Negative.   Neurological: Negative.   Endo/Heme/Allergies: Negative.   Psychiatric/Behavioral: Negative.     PHYSICAL EXAMINATION:    BP 132/80 (BP Location: Right Arm, Patient Position: Sitting, Cuff Size: Normal)   Pulse 84   Resp 16   Wt 156 lb (70.8 kg)   BMI 29.48 kg/m     General  appearance: alert, cooperative and appears stated age Lower back: tender to palpation in the midline Abdomen: soft, non-tender; non distended, no masses,  no organomegaly  Pelvic: External genitalia:  no lesions              Urethra:  normal appearing urethra with no masses, tenderness or lesions              Bartholins and Skenes: normal                 Vagina: the pessary was removed and cleaned. Normal appearing vagina with normal color and discharge, no lesions or irritation. Pessary replaced              Cervix: no lesions  Chaperone was present for exam.  ASSESSMENT Genital prolapse, improved with the pessary H/o vaginal irritation from the pessary, resolved with continued use of vaginal estrogen (2 x a week) Lower back pain, long term, worse in the last few weeks. 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 States her narcotics have been limited in #. Her primary discussed the option of going to the pain clinic, I encouraged her to do this    PLAN Pessary f/u in 3 months Continue with the estrogen cream 2 x a week Annual in 3 months Information on back pain given F/U with her primary and the pain clinic for her back pain   An After Visit Summary was printed and given to the patient.  ~15 minutes face to face time of which over 50% was spent in counseling.

## 2018-02-27 NOTE — Patient Instructions (Signed)

## 2018-02-27 NOTE — Telephone Encounter (Signed)
Routing to Dr Jertson, please review and advise 

## 2018-02-28 NOTE — Telephone Encounter (Signed)
If all of a sudden she has noticed a big change in facial hair or has new hair on her breast or lower abdomen, she should have hirsutism labs drawn. If this is a mild change, long term issue, then she can have it removed if desired. She can wax, shave, get electrolysis, laser etc.  In pre-menopausal women we often treat with OCP's, I wouldn't do that in someone who is PMP. She can always f/u with dermatology.

## 2018-03-01 NOTE — Telephone Encounter (Signed)
Spoke with patient, advised as seen below per Dr. Talbert Nan. Reports long term facial hair growth, no other locations. Request referral to [facility] [place] [facility], first available female provider. Advised order placed for referral, our referral coordinator will f/u with scheduling.   Patient request to change AEX appointment for 7/11 d/t schedule conflict. AEX rescheduled to 06/07/18 at 2:30pm with Dr. Talbert Nan. Patient verbalizes understanding.  Routing to [employer] . Will close encounter.  Cc: Dr. Talbert Nan

## 2018-03-07 ENCOUNTER — Other Ambulatory Visit: Payer: Self-pay | Admitting: Internal Medicine

## 2018-03-23 DIAGNOSIS — L680 Hirsutism: Secondary | ICD-10-CM | POA: Diagnosis not present

## 2018-03-23 DIAGNOSIS — L678 Other hair color and hair shaft abnormalities: Secondary | ICD-10-CM | POA: Diagnosis not present

## 2018-03-27 DIAGNOSIS — L680 Hirsutism: Secondary | ICD-10-CM | POA: Diagnosis not present

## 2018-04-11 ENCOUNTER — Other Ambulatory Visit: Payer: Self-pay | Admitting: Obstetrics and Gynecology

## 2018-04-11 MED ORDER — ESTRADIOL 0.1 MG/GM VA CREA
TOPICAL_CREAM | VAGINAL | 0 refills | Status: DC
Start: 2018-04-11 — End: 2018-07-25

## 2018-04-11 NOTE — Telephone Encounter (Signed)
Medication refill request: Estradiol  Last AEX:  N/A Last OV: 02-27-18  Next AEX: 06-07-18  Last MMG (if hormonal medication request): 01-24-17 WNL  Refill authorized: please advise

## 2018-04-11 NOTE — Telephone Encounter (Signed)
Patient is requesting a refill of estradiol to the pharmacy on file. °

## 2018-04-15 DIAGNOSIS — S0993XA Unspecified injury of face, initial encounter: Secondary | ICD-10-CM | POA: Diagnosis not present

## 2018-04-15 DIAGNOSIS — R6884 Jaw pain: Secondary | ICD-10-CM | POA: Diagnosis not present

## 2018-04-15 DIAGNOSIS — S29009A Unspecified injury of muscle and tendon of unspecified wall of thorax, initial encounter: Secondary | ICD-10-CM | POA: Diagnosis not present

## 2018-04-15 DIAGNOSIS — S0081XA Abrasion of other part of head, initial encounter: Secondary | ICD-10-CM | POA: Diagnosis not present

## 2018-04-15 DIAGNOSIS — S299XXA Unspecified injury of thorax, initial encounter: Secondary | ICD-10-CM | POA: Diagnosis not present

## 2018-04-15 DIAGNOSIS — S0990XA Unspecified injury of head, initial encounter: Secondary | ICD-10-CM | POA: Diagnosis not present

## 2018-04-15 DIAGNOSIS — S199XXA Unspecified injury of neck, initial encounter: Secondary | ICD-10-CM | POA: Diagnosis not present

## 2018-04-15 DIAGNOSIS — S0083XA Contusion of other part of head, initial encounter: Secondary | ICD-10-CM | POA: Diagnosis not present

## 2018-04-19 ENCOUNTER — Other Ambulatory Visit: Payer: Self-pay | Admitting: Internal Medicine

## 2018-04-19 MED ORDER — CYCLOBENZAPRINE HCL 5 MG PO TABS: 5.0000 mg | ORAL_TABLET | Freq: Three times a day (TID) | ORAL | 3 refills | Status: DC | PRN

## 2018-04-20 ENCOUNTER — Other Ambulatory Visit: Payer: Self-pay

## 2018-04-20 MED ORDER — CYCLOBENZAPRINE HCL 5 MG PO TABS: 5.0000 mg | ORAL_TABLET | Freq: Two times a day (BID) | ORAL | 1 refills | Status: DC | PRN

## 2018-05-01 ENCOUNTER — Ambulatory Visit (INDEPENDENT_AMBULATORY_CARE_PROVIDER_SITE_OTHER): Payer: Medicare Other | Admitting: Internal Medicine

## 2018-05-01 ENCOUNTER — Telehealth: Payer: Self-pay

## 2018-05-01 ENCOUNTER — Other Ambulatory Visit: Payer: Self-pay | Admitting: Nurse Practitioner

## 2018-05-01 ENCOUNTER — Encounter: Payer: Self-pay | Admitting: Internal Medicine

## 2018-05-01 VITALS — BP 152/84 | HR 85 | Resp 16 | Ht 61.0 in | Wt 159.4 lb

## 2018-05-01 DIAGNOSIS — J4520 Mild intermittent asthma, uncomplicated: Secondary | ICD-10-CM

## 2018-05-01 DIAGNOSIS — G43709 Chronic migraine without aura, not intractable, without status migrainosus: Secondary | ICD-10-CM

## 2018-05-01 DIAGNOSIS — F411 Generalized anxiety disorder: Secondary | ICD-10-CM | POA: Diagnosis not present

## 2018-05-01 DIAGNOSIS — E039 Hypothyroidism, unspecified: Secondary | ICD-10-CM | POA: Diagnosis not present

## 2018-05-01 DIAGNOSIS — E782 Mixed hyperlipidemia: Secondary | ICD-10-CM | POA: Diagnosis not present

## 2018-05-01 MED ORDER — HYDROCODONE-ACETAMINOPHEN 5-300 MG PO TABS: 1.0000 | ORAL_TABLET | Freq: Two times a day (BID) | ORAL | 0 refills | Status: DC | PRN

## 2018-05-01 NOTE — Progress Notes (Signed)
Sent prescription for hydrocodine/APAP 5/325mg  BID prn #15 tablets to [facility] per Dr. Humphrey Rolls

## 2018-05-01 NOTE — Progress Notes (Signed)
[facility] [place], [place] [address]  Internal MEDICINE  Office Visit Note  Patient Name: Bonnie West  [id number]  [id number]  Date of Service: 05/03/2018  Chief Complaint  Patient presents with  . Asthma    68month follow up   . Pain    s/p fall     Pt is here for routine follow up. She has multiple medical problems but stable at this time. Migraines and asthma is under better control. She did sustain a fall  Asthma  There is no cough or shortness of breath. Primary symptoms comments: She is feeling better afterher severe asthma flare up. This is a chronic problem. The current episode started more than 1 month ago. The problem has been resolved. Associated symptoms include postnasal drip. Pertinent negatives include no chest pain, rhinorrhea, sneezing or sore throat. Her symptoms are aggravated by exposure to fumes and exposure to smoke. Her past medical history is significant for asthma.  Medication Refill  This is a chronic (pt needs her vicodin for pain control,. she only itakes it occasionally. fell in [place] parking lot, has multiple bruises ) problem. Associated symptoms include arthralgias and joint swelling. Pertinent negatives include no abdominal pain, chest pain, chills, congestion, coughing, fatigue, nausea, neck pain, numbness, rash, sore throat or vomiting.   Current Medication: All medications reviewed in EMR  Surgical History: Past Surgical History:  Procedure Laterality Date  . BUNIONECTOMY    . CATARACT EXTRACTION W/PHACO Right 10/11/2016   Procedure: CATARACT EXTRACTION PHACO AND INTRAOCULAR LENS PLACEMENT (IOC);  Surgeon: Birder Robson, MD;  Location: [facility] ORS;  Service: Ophthalmology;  Laterality: Right;  Lot# [id number] H [place]: 00:37.7 AP%: 18.1 CDE: 6.80  . CATARACT EXTRACTION W/PHACO Left 11/08/2016   Procedure: CATARACT EXTRACTION PHACO AND INTRAOCULAR LENS PLACEMENT (IOC);  Surgeon: Birder Robson, MD;  Location: [facility]  ORS;  Service: Ophthalmology;  Laterality: Left;  PACK LOT: [id number] H US:00:32 AP:44 CDE:6.46  . COLONOSCOPY    . COLONOSCOPY WITH PROPOFOL N/A 11/26/2015   Procedure: COLONOSCOPY WITH PROPOFOL;  Surgeon: Manya Silvas, MD;  Location: [facility] ENDOSCOPY;  Service: Endoscopy;  Laterality: N/A;  . EYE SURGERY    . TONSILLECTOMY      Medical History: Past Medical History:  Diagnosis Date  . Allergic rhinitis   . Anxiety   . Asthma   . Blood transfusion without reported diagnosis   . Depression   . Diverticulosis   . Dyspnea   . Esophagitis   . GERD (gastroesophageal reflux disease)   . Headache   . Heart murmur   . Hyperlipemia   . Hypertension   . Hypothyroidism   . Obesity   . Osteopenia   . Palpitations   . Pneumothorax   . Sleep apnea     Family History: Family History  Problem Relation Age of Onset  . Breast cancer Maternal Grandmother   . Stroke Maternal Grandmother   . Bladder Cancer Father   . Prostate cancer Father   . Heart attack Father   . Aortic aneurysm Father   . Congestive Heart Failure Father   . Colon cancer Paternal Grandmother   . Aneurysm Paternal Grandmother   . Stroke Paternal Grandmother   . Congestive Heart Failure Paternal Grandmother   . Dementia Mother   . Stroke Mother   . Dementia Maternal Grandfather   . Stroke Maternal Grandfather   . Dementia Paternal Grandfather   . Stroke Paternal Grandfather     Social History  Socioeconomic History  . Marital status: Married    Spouse name: Not on file  . Number of children: Not on file  . Years of education: Not on file  . Highest education level: Not on file  Occupational History  . Not on file  Social Needs  . Financial resource strain: Not on file  . Food insecurity:    Worry: Not on file    Inability: Not on file  . Transportation needs:    Medical: Not on file    Non-medical: Not on file  Tobacco Use  . Smoking status: Former Research scientist (life sciences)  . Smokeless tobacco: Never Used   Substance and Sexual Activity  . Alcohol use: No  . Drug use: No  . Sexual activity: Never    Partners: Male    Birth control/protection: Post-menopausal  Lifestyle  . Physical activity:    Days per week: Not on file    Minutes per session: Not on file  . Stress: Not on file  Relationships  . Social connections:    Talks on phone: Not on file    Gets together: Not on file    Attends religious service: Not on file    Active member of club or organization: Not on file    Attends meetings of clubs or organizations: Not on file    Relationship status: Not on file  . Intimate partner violence:    Fear of current or ex partner: Not on file    Emotionally abused: Not on file    Physically abused: Not on file    Forced sexual activity: Not on file  Other Topics Concern  . Not on file  Social History Narrative  . Not on file   Review of Systems  Constitutional: Negative for chills, fatigue and unexpected weight change.  HENT: Positive for postnasal drip. Negative for congestion, rhinorrhea, sneezing and sore throat.   Eyes: Negative for redness.  Respiratory: Negative for cough, chest tightness and shortness of breath.   Cardiovascular: Negative for chest pain and palpitations.  Gastrointestinal: Negative for abdominal pain, constipation, diarrhea, nausea and vomiting.  Genitourinary: Negative for dysuria and frequency.  Musculoskeletal: Positive for arthralgias and joint swelling. Negative for back pain and neck pain.  Skin: Negative for rash.  Neurological: Negative.  Negative for tremors and numbness.  Hematological: Negative for adenopathy. Does not bruise/bleed easily.  Psychiatric/Behavioral: Negative for behavioral problems (Depression), sleep disturbance and suicidal ideas. The patient is not nervous/anxious.    Vital Signs: BP (!) 152/84 (BP Location: Right Arm, Patient Position: Sitting, Cuff Size: Normal)   Pulse 85   Resp 16   Ht 5\' 1"  (1.549 m)   Wt 159 lb 6.4 oz  (72.3 kg)   SpO2 98%   BMI 30.12 kg/m    Physical Exam  Constitutional: She is oriented to person, place, and time. She appears well-developed and well-nourished. No distress.  HENT:  Head: Normocephalic and atraumatic.  Mouth/Throat: Oropharynx is clear and moist. No oropharyngeal exudate.  Eyes: Pupils are equal, round, and reactive to light. EOM are normal.  Neck: Normal range of motion. Neck supple. No JVD present. No tracheal deviation present. No thyromegaly present.  Cardiovascular: Normal rate, regular rhythm and normal heart sounds. Exam reveals no gallop and no friction rub.  No murmur heard. Pulmonary/Chest: Effort normal. No respiratory distress. She has no wheezes. She has no rales. She exhibits no tenderness.  Abdominal: Soft. Bowel sounds are normal.  Musculoskeletal: Normal range of motion.  Lymphadenopathy:  She has no cervical adenopathy.  Neurological: She is alert and oriented to person, place, and time. No cranial nerve deficit.  Skin: Skin is warm and dry. She is not diaphoretic.  Bruise on right breast   Psychiatric: She has a normal mood and affect. Her behavior is normal. Judgment and thought content normal.   Assessment/Plan: 1. Chronic migraine without aura without status migrainosus, not intractable Refilled Vicodin ( limited )   2. Mild intermittent asthma without complication Improved at this time, avoid trigger  3. Hypothyroidism, unspecified type Continue synthroid   4. GAD (generalized anxiety disorder) Continue alprazolam prn   5. Mixed hyperlipidemia Controlled with meds  General Counseling: Bonnie West understanding of the findings of todays visit and agrees with plan of treatment. I have discussed any further diagnostic evaluation that may be needed or ordered today. We also reviewed her medications today. she has been encouraged to call the office with any questions or concerns that should arise related to todays vis  Meds ordered  this encounter  Medications  . DISCONTD: HYDROcodone-Acetaminophen 5-300 MG TABS    Sig: Take 1 tablet by mouth 2 (two) times daily as needed. For severe pain    Dispense:  15 each    Refill:  0    Time spent:20 Minutes   Dr Lavera Guise Internal medicine

## 2018-05-01 NOTE — Telephone Encounter (Signed)
I sent her hydrocodone. Was there something else she needed to send.

## 2018-05-03 ENCOUNTER — Other Ambulatory Visit: Payer: Self-pay | Admitting: Internal Medicine

## 2018-05-08 ENCOUNTER — Ambulatory Visit: Payer: Self-pay | Admitting: Internal Medicine

## 2018-05-17 DIAGNOSIS — L680 Hirsutism: Secondary | ICD-10-CM | POA: Diagnosis not present

## 2018-05-18 DIAGNOSIS — M25572 Pain in left ankle and joints of left foot: Secondary | ICD-10-CM | POA: Diagnosis not present

## 2018-05-18 DIAGNOSIS — M2012 Hallux valgus (acquired), left foot: Secondary | ICD-10-CM | POA: Diagnosis not present

## 2018-05-18 DIAGNOSIS — M810 Age-related osteoporosis without current pathological fracture: Secondary | ICD-10-CM | POA: Diagnosis not present

## 2018-05-18 DIAGNOSIS — M79672 Pain in left foot: Secondary | ICD-10-CM | POA: Diagnosis not present

## 2018-05-18 DIAGNOSIS — M19072 Primary osteoarthritis, left ankle and foot: Secondary | ICD-10-CM | POA: Diagnosis not present

## 2018-05-31 ENCOUNTER — Encounter: Payer: Self-pay | Admitting: Internal Medicine

## 2018-05-31 ENCOUNTER — Ambulatory Visit (INDEPENDENT_AMBULATORY_CARE_PROVIDER_SITE_OTHER): Payer: Medicare Other | Admitting: Internal Medicine

## 2018-05-31 ENCOUNTER — Ambulatory Visit: Payer: Medicare Other | Admitting: Obstetrics and Gynecology

## 2018-05-31 VITALS — BP 128/70 | HR 81 | Resp 16 | Ht 61.0 in | Wt 158.0 lb

## 2018-05-31 DIAGNOSIS — G43809 Other migraine, not intractable, without status migrainosus: Secondary | ICD-10-CM

## 2018-05-31 DIAGNOSIS — G4733 Obstructive sleep apnea (adult) (pediatric): Secondary | ICD-10-CM

## 2018-05-31 DIAGNOSIS — R0602 Shortness of breath: Secondary | ICD-10-CM | POA: Diagnosis not present

## 2018-05-31 DIAGNOSIS — Z9989 Dependence on other enabling machines and devices: Secondary | ICD-10-CM | POA: Diagnosis not present

## 2018-05-31 DIAGNOSIS — J4540 Moderate persistent asthma, uncomplicated: Secondary | ICD-10-CM | POA: Diagnosis not present

## 2018-05-31 DIAGNOSIS — J301 Allergic rhinitis due to pollen: Secondary | ICD-10-CM

## 2018-05-31 NOTE — Progress Notes (Signed)
[facility] [place], [place] [address]  Pulmonary Sleep Medicine   Office Visit Note  Patient Name: Bonnie West DOB: June 22, 1951 MRN [id number]  Date of Service: 05/31/2018  Complaints/HPI: breathing better. She had a fall apparently at [facility]. Patient states she has had no admissions.  Denies any of any chest pain.  States that she has some shortness of breath still with exertion.  She is been using her inhalers as prescribed.  As far as her headaches are concerned her new control.  Denies having any significant flareups.  Her allergies of also been under good control with current management.  Sleep apnea she is using her CPAP as prescribed.  ROS  General: (-) fever, (-) chills, (-) night sweats, (-) weakness Skin: (-) rashes, (-) itching,. Eyes: (-) visual changes, (-) redness, (-) itching. Nose and Sinuses: (-) nasal stuffiness or itchiness, (-) postnasal drip, (-) nosebleeds, (-) sinus trouble. Mouth and Throat: (-) sore throat, (-) hoarseness. Neck: (-) swollen glands, (-) enlarged thyroid, (-) neck pain. Respiratory: - cough, (-) bloody sputum, + shortness of breath, - wheezing. Cardiovascular: - ankle swelling, (-) chest pain. Lymphatic: (-) lymph node enlargement. Neurologic: (-) numbness, (-) tingling. Psychiatric: (-) anxiety, (-) depression   Current Medication: Outpatient Encounter Medications as of 05/31/2018  Medication Sig  . acetaminophen (TYLENOL) 500 MG tablet Take 1,000 mg by mouth 2 (two) times daily as needed for mild pain or moderate pain.  Marland Kitchen ALPRAZolam (XANAX) 0.25 MG tablet Take 1 tablet (0.25 mg total) by mouth 2 (two) times daily as needed for anxiety.  Marland Kitchen amLODipine (NORVASC) 5 MG tablet Take 1 tablet (5 mg total) by mouth daily.  Marland Kitchen azelastine (ASTELIN) 0.1 % nasal spray Place 1 spray into both nostrils at bedtime as needed for rhinitis or allergies. Use in each nostril as directed  . betamethasone valerate ointment (VALISONE) 0.1  % Apply a pea sized amount topically bid x 1-2 weeks as needed (Patient taking differently: Apply 1 application topically 2 (two) times daily as needed. Apply a pea sized amount topically bid x 1-2 weeks as needed for skin breakdown)  . bisacodyl (DULCOLAX) 5 MG EC tablet Take 5-10 mg by mouth at bedtime as needed for moderate constipation.  . cetirizine (ZYRTEC) 10 MG tablet Take 10 mg by mouth daily.  . chlorpheniramine-HYDROcodone (TUSSIONEX) 10-8 MG/5ML SUER Take 5 mLs by mouth every 12 (twelve) hours as needed for cough.  . cholecalciferol (VITAMIN D) 1000 units tablet Take 1,000 Units by mouth at bedtime.  . clindamycin (CLEOCIN T) 1 % external solution Apply 1 application topically 2 (two) times daily as needed. For skin breakdown  . cyclobenzaprine (FLEXERIL) 5 MG tablet Take 1 tablet (5 mg total) by mouth 2 (two) times daily as needed for muscle spasms.  Marland Kitchen docusate sodium (COLACE) 100 MG capsule Take 100-200 mg by mouth at bedtime as needed for mild constipation or moderate constipation.   Marland Kitchen estradiol (ESTRACE) 0.1 MG/GM vaginal cream 1 gram vaginally twice weekly at bedtime  . fenofibrate 160 MG tablet Take 1 tablet (160 mg total) by mouth daily.  Marland Kitchen FLOVENT HFA 110 MCG/ACT inhaler INHALE 2 PUFFS BY MOUTH EVERY 12 HOURS  . FLUoxetine (PROZAC) 40 MG capsule Take 1 capsule (40 mg total) by mouth daily.  Marland Kitchen HYDROcodone-Acetaminophen 5-300 MG TABS Take 1 tablet by mouth 2 (two) times daily as needed. For severe pain  . ipratropium (ATROVENT HFA) 17 MCG/ACT inhaler Inhale 2 puffs into the lungs 2 (  two) times daily.   Marland Kitchen isometheptene-acetaminophen-dichloralphenazone (MIDRIN) 65-100-325 MG capsule Take 1 capsule by mouth 4 (four) times daily as needed for migraine. Maximum 5 capsules in 12 hours for migraine headaches, 8 capsules in 24 hours for tension headaches.  . levothyroxine (SYNTHROID, LEVOTHROID) 100 MCG tablet Take 1 tablet (100 mcg total) by mouth every morning.  Marland Kitchen lisinopril  (PRINIVIL,ZESTRIL) 10 MG tablet Take 1 tablet (10 mg total) by mouth daily.  . magnesium oxide (MAG-OX) 400 MG tablet Take 400 mg by mouth at bedtime.   . montelukast (SINGULAIR) 10 MG tablet Take 1 tablet (10 mg total) by mouth at bedtime. (Patient taking differently: Take 10 mg by mouth daily. )  . omeprazole (PRILOSEC) 40 MG capsule Take 40 mg by mouth daily.  Marland Kitchen triamcinolone cream (KENALOG) 0.1 % Apply 1 application topically 2 (two) times daily as needed. For skin breakdown  . VENTOLIN HFA 108 (90 Base) MCG/ACT inhaler INHALE 2 PUFFS BY MOUTH EVERY 6 HOURS  . vitamin B-12 (CYANOCOBALAMIN) 1000 MCG tablet Take 1,000 mcg by mouth daily.   No facility-administered encounter medications on file as of 05/31/2018.     Surgical History: Past Surgical History:  Procedure Laterality Date  . BUNIONECTOMY    . CATARACT EXTRACTION W/PHACO Right 10/11/2016   Procedure: CATARACT EXTRACTION PHACO AND INTRAOCULAR LENS PLACEMENT (IOC);  Surgeon: Birder Robson, MD;  Location: [facility] ORS;  Service: Ophthalmology;  Laterality: Right;  Lot# [id number] H [place]: 00:37.7 AP%: 18.1 CDE: 6.80  . CATARACT EXTRACTION W/PHACO Left 11/08/2016   Procedure: CATARACT EXTRACTION PHACO AND INTRAOCULAR LENS PLACEMENT (IOC);  Surgeon: Birder Robson, MD;  Location: [facility] ORS;  Service: Ophthalmology;  Laterality: Left;  PACK LOT: [id number] H US:00:32 AP:44 CDE:6.46  . COLONOSCOPY    . COLONOSCOPY WITH PROPOFOL N/A 11/26/2015   Procedure: COLONOSCOPY WITH PROPOFOL;  Surgeon: Manya Silvas, MD;  Location: [facility] ENDOSCOPY;  Service: Endoscopy;  Laterality: N/A;  . EYE SURGERY    . TONSILLECTOMY      Medical History: Past Medical History:  Diagnosis Date  . Allergic rhinitis   . Anxiety   . Asthma   . Blood transfusion without reported diagnosis   . Depression   . Diverticulosis   . Dyspnea   . Esophagitis   . GERD (gastroesophageal reflux disease)   . Headache   . Heart murmur   . Hyperlipemia   . Hypertension    . Hypothyroidism   . Obesity   . Osteopenia   . Palpitations   . Pneumothorax   . Sleep apnea     Family History: Family History  Problem Relation Age of Onset  . Breast cancer Maternal Grandmother   . Stroke Maternal Grandmother   . Bladder Cancer Father   . Prostate cancer Father   . Heart attack Father   . Aortic aneurysm Father   . Congestive Heart Failure Father   . Colon cancer Paternal Grandmother   . Aneurysm Paternal Grandmother   . Stroke Paternal Grandmother   . Congestive Heart Failure Paternal Grandmother   . Dementia Mother   . Stroke Mother   . Dementia Maternal Grandfather   . Stroke Maternal Grandfather   . Dementia Paternal Grandfather   . Stroke Paternal Grandfather     Social History: Social History   Socioeconomic History  . Marital status: Married    Spouse name: Not on file  . Number of children: Not on file  . Years of education: Not on file  . Highest education level:  Not on file  Occupational History  . Not on file  Social Needs  . Financial resource strain: Not on file  . Food insecurity:    Worry: Not on file    Inability: Not on file  . Transportation needs:    Medical: Not on file    Non-medical: Not on file  Tobacco Use  . Smoking status: Former Research scientist (life sciences)  . Smokeless tobacco: Never Used  Substance and Sexual Activity  . Alcohol use: No  . Drug use: No  . Sexual activity: Never    Partners: Male    Birth control/protection: Post-menopausal  Lifestyle  . Physical activity:    Days per week: Not on file    Minutes per session: Not on file  . Stress: Not on file  Relationships  . Social connections:    Talks on phone: Not on file    Gets together: Not on file    Attends religious service: Not on file    Active member of club or organization: Not on file    Attends meetings of clubs or organizations: Not on file    Relationship status: Not on file  . Intimate partner violence:    Fear of current or ex partner: Not on  file    Emotionally abused: Not on file    Physically abused: Not on file    Forced sexual activity: Not on file  Other Topics Concern  . Not on file  Social History Narrative  . Not on file    Vital Signs: Blood pressure 128/70, pulse 81, resp. rate 16, height 5\' 1"  (1.549 m), weight 158 lb (71.7 kg), SpO2 98 %.  Examination: General Appearance: The patient is well-developed, well-nourished, and in no distress. Skin: Gross inspection of skin unremarkable. Head: normocephalic, no gross deformities. Eyes: no gross deformities noted. ENT: ears appear grossly normal no exudates. Neck: Supple. No thyromegaly. No LAD. Respiratory: no rhonchi noted on exam. Cardiovascular: Normal S1 and S2 without murmur or rub. Extremities: No cyanosis. pulses are equal. Neurologic: Alert and oriented. No involuntary movements.  LABS: No results found for this or any previous visit (from the past 2160 hour(s)).  Radiology: Dg Chest 2 View  Result Date: 12/31/2017 CLINICAL DATA:  Cough, congestion, wheezing and shortness of breath for 5 days, history asthma, hypertension EXAM: CHEST  2 VIEW COMPARISON:  09/22/2016 FINDINGS: Normal heart size, mediastinal contours, and pulmonary vascularity. Slightly increased chronic bronchitic changes and accentuation of perihilar markings. Lungs otherwise clear. No pleural effusion or pneumothorax. Bones unremarkable. IMPRESSION: Increased bronchitic changes and accentuation of perihilar markings which could reflect worsened bronchitis or asthma. No definite acute infiltrate. Electronically Signed   By: Lavonia Dana M.D.   On: 12/31/2017 18:34    No results found.  No results found.    Assessment and Plan: Patient Active Problem List   Diagnosis Date Noted  . Asthma without status asthmaticus 01/30/2018  . Hyperlipidemia, unspecified 01/30/2018  . Hypertension 01/30/2018  . Obesity, unspecified 01/30/2018  . Thyroid disease 01/30/2018  . Acute respiratory  failure with hypoxia ([place]) 12/31/2017  . Fatty infiltration of liver 12/10/2015  . Abdominal pain, LLQ (left lower quadrant) 11/03/2015  . History of adenomatous polyp of colon 11/03/2015  . Hallux valgus, left 09/18/2014  . 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  . Shoulder arthritis 12/19/2013    1. Chronic Asthma medications reviewed we will continue with present management.  Follow-up pulmonary functions as prescribed.  We will continue supportive care. 2. Chronic  Migraines stable we will continue with present management she will follow-up with her primary care 3. Allergic rhinitis no recent flareups she is actually done well this summer 4. OSA on CPAP therapy will be continued.  Current pressures are adequate to control her symptoms  General Counseling: I have discussed the findings of the evaluation and examination with Vaughan Basta.  I have also discussed any further diagnostic evaluation thatmay be needed or ordered today. Jordan verbalizes understanding of the findings of todays visit. We also reviewed her medications today and discussed drug interactions and side effects including but not limited excessive drowsiness and altered mental states. We also discussed that there is always a risk not just to her but also people around her. she has been encouraged to call the office with any questions or concerns that should arise related to todays visit.  Orders Placed This Encounter  Procedures  . Spirometry with graph    Order Specific Question:   Where should this test be performed?    Answer:   [facility]  . Pulmonary function test    Standing Status:   Future    Standing Expiration Date:   06/01/2019    Order Specific Question:   Where should this test be performed?    Answer:   [facility]      Time spent: 24min  I have personally obtained a history, examined the patient, evaluated laboratory and imaging results, formulated the assessment and plan and placed  orders.    Allyne Gee, MD [facility] Pulmonary and Critical Care Sleep medicine

## 2018-05-31 NOTE — Patient Instructions (Signed)

## 2018-06-01 DIAGNOSIS — M19072 Primary osteoarthritis, left ankle and foot: Secondary | ICD-10-CM | POA: Diagnosis not present

## 2018-06-01 DIAGNOSIS — M2012 Hallux valgus (acquired), left foot: Secondary | ICD-10-CM | POA: Diagnosis not present

## 2018-06-06 ENCOUNTER — Ambulatory Visit (INDEPENDENT_AMBULATORY_CARE_PROVIDER_SITE_OTHER): Payer: Medicare Other | Admitting: Internal Medicine

## 2018-06-06 DIAGNOSIS — R0602 Shortness of breath: Secondary | ICD-10-CM | POA: Diagnosis not present

## 2018-06-06 LAB — PULMONARY FUNCTION TEST

## 2018-06-07 ENCOUNTER — Ambulatory Visit: Payer: Self-pay | Admitting: Obstetrics and Gynecology

## 2018-06-10 NOTE — Procedures (Signed)
[place], [address]  DATE OF SERVICE: June 06, 2018  Complete Pulmonary Function Testing Interpretation:  FINDINGS:   the forced vital capacity is normal.  The FEV1 is normal.  The FEV1 FVC ratio is normal.  Post bronchodilator there is no significant change in the FEV1 however clinical improvement may occur in the absence of spirometric improvement and does not preclude the use of bronchodilators.  The total lung capacity is normal as measured by plethysmography.  Residual volume is decreased residual volume total lung capacity ratio is decreased the FRC is decreased.  The DLCO is mildly decreased and normal when corrected for alveolar volume.  IMPRESSION:    This pulmonary function studies within normal limits  Allyne Gee, MD [facility] Pulmonary Critical Care Medicine Sleep Medicine

## 2018-06-14 NOTE — Progress Notes (Signed)
67 y.o. G1P1001 MarriedCaucasianF here for annual exam and pessary check. She has a h/o uterine prolapse (grade 2), mild cystocele. She is using her estrogen vaginally 2 x a week mostly. No bleeding, no c/o from the pessary.  She has chronic back pain. She had an injury to her left ankle.   She leaks urine with valsalva, small amounts, no change, wears a pad. BM are okay.  Not sexually active, husband with ED.  She saw the dermatologist for hirsutism, lab work was okay. Was given spirolactone, went to the laser doctor. The second doctor recommended she stop the spironolactone. She is now shaving.     No LMP recorded. Patient is postmenopausal.          Sexually active: No.  The current method of family planning is post menopausal status.    Exercising: No.  The patient does not participate in regular exercise at present. Smoker:  Former smoker   Health Maintenance: Pap:  Unsure but thinks 2018 with PCP History of abnormal Pap:  yes MMG:  01-24-17 density B/BIRADS  Colonoscopy:  2017 polyps BMD:   2018 osteopenia per patient. Followed with her primary  TDaP:  2016  Gardasil: n/a   reports that she has quit smoking. She has never used smokeless tobacco. She reports that she does not drink alcohol or use drugs. She plays piano for a [employer]  Past Medical History:  Diagnosis Date  . Allergic rhinitis   . Anxiety   . Asthma   . Blood transfusion without reported diagnosis   . Depression   . Diverticulosis   . Dyspnea   . Esophagitis   . GERD (gastroesophageal reflux disease)   . Headache   . Heart murmur   . Hyperlipemia   . Hypertension   . Hypothyroidism   . Obesity   . Osteopenia   . Palpitations   . Pneumothorax   . Sleep apnea     Past Surgical History:  Procedure Laterality Date  . BUNIONECTOMY    . CATARACT EXTRACTION W/PHACO Right 10/11/2016   Procedure: CATARACT EXTRACTION PHACO AND INTRAOCULAR LENS PLACEMENT (IOC);  Surgeon: Birder Robson, MD;  Location: [facility]  ORS;  Service: Ophthalmology;  Laterality: Right;  Lot# [id number] H [place]: 00:37.7 AP%: 18.1 CDE: 6.80  . CATARACT EXTRACTION W/PHACO Left 11/08/2016   Procedure: CATARACT EXTRACTION PHACO AND INTRAOCULAR LENS PLACEMENT (IOC);  Surgeon: Birder Robson, MD;  Location: [facility] ORS;  Service: Ophthalmology;  Laterality: Left;  PACK LOT: [id number] H US:00:32 AP:44 CDE:6.46  . COLONOSCOPY    . COLONOSCOPY WITH PROPOFOL N/A 11/26/2015   Procedure: COLONOSCOPY WITH PROPOFOL;  Surgeon: Manya Silvas, MD;  Location[facility]COPY;  Service: Endoscopy;  Laterality: N/A;  . EYE SURGERY    . TONSILLECTOMY      Current Outpatient Medications  Medication Sig Dispense Refill  . acetaminophen (TYLENOL) 500 MG tablet Take 1,000 mg by mouth 2 (two) times daily as needed for mild pain or moderate pain.    Marland Kitchen ALPRAZolam (XANAX) 0.25 MG tablet Take 1 tablet (0.25 mg total) by mouth 2 (two) times daily as needed for anxiety. 15 tablet 0  . amLODipine (NORVASC) 5 MG tablet Take 1 tablet (5 mg total) by mouth daily. 90 tablet 3  . azelastine (ASTELIN) 0.1 % nasal spray Place 1 spray into both nostrils at bedtime as needed for rhinitis or allergies. Use in each nostril as directed    . betamethasone valerate ointment (VALISONE) 0.1 % Apply a pea sized amount topically  bid x 1-2 weeks as needed (Patient taking differently: Apply 1 application topically 2 (two) times daily as needed. Apply a pea sized amount topically bid x 1-2 weeks as needed for skin breakdown) 15 g 0  . bisacodyl (DULCOLAX) 5 MG EC tablet Take 5-10 mg by mouth at bedtime as needed for moderate constipation.    . cetirizine (ZYRTEC) 10 MG tablet Take 10 mg by mouth daily.    . chlorpheniramine-HYDROcodone (TUSSIONEX) 10-8 MG/5ML SUER Take 5 mLs by mouth every 12 (twelve) hours as needed for cough. 115 mL 0  . cholecalciferol (VITAMIN D) 1000 units tablet Take 1,000 Units by mouth at bedtime.    . clindamycin (CLEOCIN T) 1 % external solution Apply 1  application topically 2 (two) times daily as needed. For skin breakdown 30 mL 2  . cyclobenzaprine (FLEXERIL) 5 MG tablet Take 1 tablet (5 mg total) by mouth 2 (two) times daily as needed for muscle spasms. 60 tablet 1  . diclofenac sodium (VOLTAREN) 1 % GEL APPLY 2 GRAMS TO AFFECTED AREA 4 TIMES A DAY  11  . docusate sodium (COLACE) 100 MG capsule Take 100-200 mg by mouth at bedtime as needed for mild constipation or moderate constipation.     Marland Kitchen estradiol (ESTRACE) 0.1 MG/GM vaginal cream 1 gram vaginally twice weekly at bedtime 42.5 g 0  . fenofibrate 160 MG tablet Take 1 tablet (160 mg total) by mouth daily. 90 tablet 1  . FLOVENT HFA 110 MCG/ACT inhaler INHALE 2 PUFFS BY MOUTH EVERY 12 HOURS 12 Inhaler 1  . FLUoxetine (PROZAC) 40 MG capsule Take 1 capsule (40 mg total) by mouth daily. 90 capsule 1  . HYDROcodone-Acetaminophen 5-300 MG TABS Take 1 tablet by mouth 2 (two) times daily as needed. For severe pain 15 each 0  . ipratropium (ATROVENT HFA) 17 MCG/ACT inhaler Inhale 2 puffs into the lungs 2 (two) times daily.     Marland Kitchen isometheptene-acetaminophen-dichloralphenazone (MIDRIN) 65-100-325 MG capsule Take 1 capsule by mouth 4 (four) times daily as needed for migraine. Maximum 5 capsules in 12 hours for migraine headaches, 8 capsules in 24 hours for tension headaches.    . levothyroxine (SYNTHROID, LEVOTHROID) 100 MCG tablet Take 1 tablet (100 mcg total) by mouth every morning. 90 tablet 3  . lisinopril (PRINIVIL,ZESTRIL) 10 MG tablet Take 1 tablet (10 mg total) by mouth daily. 30 tablet 5  . magnesium oxide (MAG-OX) 400 MG tablet Take 400 mg by mouth at bedtime.     . montelukast (SINGULAIR) 10 MG tablet Take 1 tablet (10 mg total) by mouth at bedtime. (Patient taking differently: Take 10 mg by mouth daily. ) 90 tablet 3  . omeprazole (PRILOSEC) 40 MG capsule Take 40 mg by mouth daily.    Marland Kitchen triamcinolone cream (KENALOG) 0.1 % Apply 1 application topically 2 (two) times daily as needed. For skin  breakdown    . VENTOLIN HFA 108 (90 Base) MCG/ACT inhaler INHALE 2 PUFFS BY MOUTH EVERY 6 HOURS 18 Inhaler 1  . vitamin B-12 (CYANOCOBALAMIN) 1000 MCG tablet Take 1,000 mcg by mouth daily.    Marland Kitchen ipratropium (ATROVENT) 0.02 % nebulizer solution Inhale into the lungs.     No current facility-administered medications for this visit.     Family History  Problem Relation Age of Onset  . Breast cancer Maternal Grandmother   . Stroke Maternal Grandmother   . Bladder Cancer Father   . Prostate cancer Father   . Heart attack Father   . Aortic  aneurysm Father   . Congestive Heart Failure Father   . Colon cancer Paternal Grandmother   . Aneurysm Paternal Grandmother   . Stroke Paternal Grandmother   . Congestive Heart Failure Paternal Grandmother   . Dementia Mother   . Stroke Mother   . Dementia Maternal Grandfather   . Stroke Maternal Grandfather   . Dementia Paternal Grandfather   . Stroke Paternal Grandfather     Review of Systems  Constitutional: Negative.   HENT: Negative.   Eyes: Negative.   Respiratory: Negative.   Cardiovascular: Negative.   Gastrointestinal: Negative.   Endocrine: Negative.   Genitourinary:       Night urination Loss of urine with cough or sneeze   Musculoskeletal: Negative.   Skin:       Hair loss  Allergic/Immunologic: Negative.   Neurological: Positive for headaches.  Hematological: Negative.   Psychiatric/Behavioral: Negative.     Exam:   BP 122/72 (BP Location: Right Arm, Patient Position: Sitting)   Pulse 68   Ht 5' 0.63" (1.54 m)   Wt 154 lb (69.9 kg)   BMI 29.45 kg/m   Weight change: @WEIGHTCHANGE @ Height:   Height: 5' 0.63" (154 cm)  Ht Readings from Last 3 Encounters:  06/15/18 5' 0.63" (1.54 m)  05/31/18 5\' 1"  (1.549 m)  05/01/18 5\' 1"  (1.549 m)    General appearance: alert, cooperative and appears stated age Head: Normocephalic, without obvious abnormality, atraumatic Neck: no adenopathy, supple, symmetrical, trachea midline  and thyroid normal to inspection and palpation Lungs: clear to auscultation bilaterally Cardiovascular: regular rate and rhythm Breasts: normal appearance, no masses or tenderness Abdomen: soft, non-tender; non distended,  no masses,  no organomegaly Extremities: extremities normal, atraumatic, no cyanosis or edema Skin: Skin color, texture, turgor normal. No rashes or lesions Lymph nodes: Cervical, supraclavicular, and axillary nodes normal. No abnormal inguinal nodes palpated Neurologic: Grossly normal   Pelvic: External genitalia:  no lesions              Urethra:  normal appearing urethra with no masses, tenderness or lesions              Bartholins and Skenes: normal                 Vagina: the pessary was removed and cleaned. No vaginal irritation.               Cervix: no lesions               Bimanual Exam:  Uterus:  normal size, contour, position, consistency, mobility, non-tender              Adnexa: no mass, fullness, tenderness               Rectovaginal: Confirms               Anus:  normal sphincter tone, no lesions  Chaperone was present for exam.  A:  Well Woman with normal exam  Genital prolapse, controlled with pessary  GSI, tolerable    P:   Pap with reflex hpv  Pessary removed, cleaned and reinserted  Mammogram due, she will schedule  Continue with her vaginal estrogen 2 x a week  Colonoscopy UTD  DEXA with her primary  Discussed breast self exam  Discussed calcium and vit D intake

## 2018-06-15 ENCOUNTER — Encounter: Payer: Self-pay | Admitting: Obstetrics and Gynecology

## 2018-06-15 ENCOUNTER — Other Ambulatory Visit: Payer: Self-pay

## 2018-06-15 ENCOUNTER — Other Ambulatory Visit (HOSPITAL_COMMUNITY)
Admission: RE | Admit: 2018-06-15 | Discharge: 2018-06-15 | Disposition: A | Discharge status: Home / Self Care | Payer: Medicare Other | Source: Ambulatory Visit | Attending: Obstetrics and Gynecology | Admitting: Obstetrics and Gynecology

## 2018-06-15 ENCOUNTER — Ambulatory Visit (INDEPENDENT_AMBULATORY_CARE_PROVIDER_SITE_OTHER): Payer: Medicare Other | Admitting: Obstetrics and Gynecology

## 2018-06-15 VITALS — BP 122/72 | HR 68 | Ht 60.63 in | Wt 154.0 lb

## 2018-06-15 DIAGNOSIS — Z01419 Encounter for gynecological examination (general) (routine) without abnormal findings: Secondary | ICD-10-CM | POA: Diagnosis not present

## 2018-06-15 DIAGNOSIS — Z124 Encounter for screening for malignant neoplasm of cervix: Secondary | ICD-10-CM | POA: Diagnosis not present

## 2018-06-15 DIAGNOSIS — Z4689 Encounter for fitting and adjustment of other specified devices: Secondary | ICD-10-CM

## 2018-06-15 NOTE — Patient Instructions (Signed)
EXERCISE AND DIET:  We recommended that you start or continue a regular exercise program for good health. Regular exercise means any activity that makes your heart beat faster and makes you sweat.  We recommend exercising at least 30 minutes per day at least 3 days a week, preferably 4 or 5.  We also recommend a diet low in fat and sugar.  Inactivity, poor dietary choices and obesity can cause diabetes, heart attack, stroke, and kidney damage, among others.    ALCOHOL AND SMOKING:  Women should limit their alcohol intake to no more than 7 drinks/beers/glasses of wine (combined, not each!) per week. Moderation of alcohol intake to this level decreases your risk of breast cancer and liver damage. And of course, no recreational drugs are part of a healthy lifestyle.  And absolutely no smoking or even second hand smoke. Most people know smoking can cause heart and lung diseases, but did you know it also contributes to weakening of your bones? Aging of your skin?  Yellowing of your teeth and nails?  CALCIUM AND VITAMIN D:  Adequate intake of calcium and Vitamin D are recommended.  The recommendations for exact amounts of these supplements seem to change often, but generally speaking 600 mg of calcium (either carbonate or citrate) and 800 units of Vitamin D per day seems prudent. Certain women may benefit from higher intake of Vitamin D.  If you are among these women, your doctor will have told you during your visit.    PAP SMEARS:  Pap smears, to check for cervical cancer or precancers,  have traditionally been done yearly, although recent scientific advances have shown that most women can have pap smears less often.  However, every woman still should have a physical exam from her gynecologist every year. It will include a breast check, inspection of the vulva and vagina to check for abnormal growths or skin changes, a visual exam of the cervix, and then an exam to evaluate the size and shape of the uterus and  ovaries.  And after 69 years of age, a rectal exam is indicated to check for rectal cancers. We will also provide age appropriate advice regarding health maintenance, like when you should have certain vaccines, screening for sexually transmitted diseases, bone density testing, colonoscopy, mammograms, etc.   MAMMOGRAMS:  All women over 40 years old should have a yearly mammogram. Many facilities now offer a "3D" mammogram, which may cost around $50 extra out of pocket. If possible,  we recommend you accept the option to have the 3D mammogram performed.  It both reduces the number of women who will be called back for extra views which then turn out to be normal, and it is better than the routine mammogram at detecting truly abnormal areas.    COLONOSCOPY:  Colonoscopy to screen for colon cancer is recommended for all women at age 50.  We know, you hate the idea of the prep.  We agree, BUT, having colon cancer and not knowing it is worse!!  Colon cancer so often starts as a polyp that can be seen and removed at colonscopy, which can quite literally save your life!  And if your first colonoscopy is normal and you have no family history of colon cancer, most women don't have to have it again for 10 years.  Once every ten years, you can do something that may end up saving your life, right?  We will be happy to help you get it scheduled when you are ready.    Be sure to check your insurance coverage so you understand how much it will cost.  It may be covered as a preventative service at no cost, but you should check your particular policy.      Breast Self-Awareness Breast self-awareness means being familiar with how your breasts look and feel. It involves checking your breasts regularly and reporting any changes to your health care provider. Practicing breast self-awareness is important. A change in your breasts can be a sign of a serious medical problem. Being familiar with how your breasts look and feel allows  you to find any problems early, when treatment is more likely to be successful. All women should practice breast self-awareness, including women who have had breast implants. How to do a breast self-exam One way to learn what is normal for your breasts and whether your breasts are changing is to do a breast self-exam. To do a breast self-exam: Look for Changes  1. Remove all the clothing above your waist. 2. Stand in front of a mirror in a room with good lighting. 3. Put your hands on your hips. 4. Push your hands firmly downward. 5. Compare your breasts in the mirror. Look for differences between them (asymmetry), such as: ? Differences in shape. ? Differences in size. ? Puckers, dips, and bumps in one breast and not the other. 6. Look at each breast for changes in your skin, such as: ? Redness. ? Scaly areas. 7. Look for changes in your nipples, such as: ? Discharge. ? Bleeding. ? Dimpling. ? Redness. ? A change in position. Feel for Changes  Carefully feel your breasts for lumps and changes. It is best to do this while lying on your back on the floor and again while sitting or standing in the shower or tub with soapy water on your skin. Feel each breast in the following way:  Place the arm on the side of the breast you are examining above your head.  Feel your breast with the other hand.  Start in the nipple area and make  inch (2 cm) overlapping circles to feel your breast. Use the pads of your three middle fingers to do this. Apply light pressure, then medium pressure, then firm pressure. The light pressure will allow you to feel the tissue closest to the skin. The medium pressure will allow you to feel the tissue that is a little deeper. The firm pressure will allow you to feel the tissue close to the ribs.  Continue the overlapping circles, moving downward over the breast until you feel your ribs below your breast.  Move one finger-width toward the center of the body.  Continue to use the  inch (2 cm) overlapping circles to feel your breast as you move slowly up toward your collarbone.  Continue the up and down exam using all three pressures until you reach your armpit.  Write Down What You Find  Write down what is normal for each breast and any changes that you find. Keep a written record with breast changes or normal findings for each breast. By writing this information down, you do not need to depend only on memory for size, tenderness, or location. Write down where you are in your menstrual cycle, if you are still menstruating. If you are having trouble noticing differences in your breasts, do not get discouraged. With time you will become more familiar with the variations in your breasts and more comfortable with the exam. How often should I examine my breasts? Examine   your breasts every month. If you are breastfeeding, the best time to examine your breasts is after a feeding or after using a breast pump. If you menstruate, the best time to examine your breasts is 5-7 days after your period is over. During your period, your breasts are lumpier, and it may be more difficult to notice changes. When should I see my health care provider? See your health care provider if you notice:  A change in shape or size of your breasts or nipples.  A change in the skin of your breast or nipples, such as a reddened or scaly area.  Unusual discharge from your nipples.  A lump or thick area that was not there before.  Pain in your breasts.  Anything that concerns you.  This information is not intended to replace advice given to you by your health care provider. Make sure you discuss any questions you have with your health care provider. Document Released: 11/07/2005 Document Revised: 04/14/2016 Document Reviewed: 09/27/2015 Elsevier Interactive Patient Education  2018 [employer]  

## 2018-06-19 LAB — CYTOLOGY - PAP: Diagnosis: NEGATIVE

## 2018-07-19 ENCOUNTER — Other Ambulatory Visit: Payer: Self-pay | Admitting: Internal Medicine

## 2018-07-20 ENCOUNTER — Other Ambulatory Visit: Payer: Self-pay | Admitting: Obstetrics and Gynecology

## 2018-07-20 NOTE — Telephone Encounter (Signed)
Medication refill request: Estradiol Last AEX:  06/15/18 JJ Next OV: 09/19/18 3 month recheck Last MMG (if hormonal medication request): 01/24/17 BIRADS 1 negative/density b Refill authorized: 04/11/18 #42.5g w/0 refills; today please advise

## 2018-07-24 ENCOUNTER — Other Ambulatory Visit: Payer: Self-pay | Admitting: Nurse Practitioner

## 2018-07-24 ENCOUNTER — Telehealth: Payer: Self-pay | Admitting: Nurse Practitioner

## 2018-07-24 DIAGNOSIS — F411 Generalized anxiety disorder: Secondary | ICD-10-CM

## 2018-07-24 MED ORDER — ALPRAZOLAM 0.25 MG PO TABS: 0.2500 mg | ORAL_TABLET | Freq: Two times a day (BID) | ORAL | 0 refills | Status: DC | PRN

## 2018-07-24 NOTE — Telephone Encounter (Signed)
Renewed alprazolam rx per patient request and sent to her pharmacy.

## 2018-07-24 NOTE — Telephone Encounter (Signed)
The patient was given a script for estrace cream 42.5 grams in 5/19, that should theoretically last 42 weeks. She shouldn't be out yet. She is also overdue for a mammogram. Will you please check on how much estrogen she has left and make sure she sets up her mammogram.

## 2018-07-24 NOTE — Progress Notes (Signed)
Renewed alprazolam rx per patient request and sent to her pharmacy.

## 2018-07-25 ENCOUNTER — Other Ambulatory Visit: Payer: Self-pay | Admitting: Obstetrics and Gynecology

## 2018-07-25 DIAGNOSIS — Z1231 Encounter for screening mammogram for malignant neoplasm of breast: Secondary | ICD-10-CM

## 2018-07-25 MED ORDER — ESTRADIOL 0.1 MG/GM VA CREA: TOPICAL_CREAM | VAGINAL | 1 refills | Status: DC

## 2018-07-25 NOTE — Telephone Encounter (Signed)
Patient scheduled for screening MMG on 07/26/18 at [place] at [facility].   Call returned to patient, advised of MMG appt as seen above. Patient agreeable to date and time.   Routing to Dr. Talbert Nan

## 2018-07-25 NOTE — Telephone Encounter (Signed)
My error, 42 grams of estrace should last about 21 weeks (some loss with filling applicator). Will refill her script since her mammogram is scheduled for tomorrow.

## 2018-07-25 NOTE — Telephone Encounter (Signed)
Spoke with patient.   1. Patient requesting refill of vaginal estradiol cream. Patient is using 1 gram vaginally twice weekly, occasionally once weekly. Confirmed patient is filling applicator as directed. Patient has less than one dose remaining. Uses with pessary.  2. Patient has not completed MMG, RN will call to schedule at [facility] and return call. Advised once MMG results received, Dr. Talbert Nan will advise on vaginal estrogen cream refill. Patient agreeable.

## 2018-07-25 NOTE — Telephone Encounter (Signed)
Left message to call Melchizedek Espinola at [phone].  

## 2018-07-26 ENCOUNTER — Ambulatory Visit
Admission: RE | Admit: 2018-07-26 | Discharge: 2018-07-26 | Disposition: A | Discharge status: Home / Self Care | Payer: Medicare Other | Source: Ambulatory Visit | Attending: Obstetrics and Gynecology | Admitting: Obstetrics and Gynecology

## 2018-07-26 DIAGNOSIS — Z1231 Encounter for screening mammogram for malignant neoplasm of breast: Secondary | ICD-10-CM | POA: Diagnosis not present

## 2018-07-26 NOTE — Telephone Encounter (Signed)
Spoke with patient, advised as seen below per Dr. Jertson. Patient verbalizes understanding and is agreeable. Encounter closed.  

## 2018-08-02 ENCOUNTER — Telehealth: Payer: Self-pay | Admitting: Obstetrics and Gynecology

## 2018-08-02 NOTE — Telephone Encounter (Addendum)
Patient called to see if Dr. Talbert Nan has received her mammogram report yet. She said it was done at at the [facility] a couple of weeks ago around 07/26/18.

## 2018-08-02 NOTE — Telephone Encounter (Signed)
Spoke with patient. Patient asking if MMG report recieved from 07/26/18?   Advised MMG report received.  Advised IMPRESSION: No mammographic evidence of malignancy. A result letter of this screening mammogram will be mailed directly to the patient. RECOMMENDATION: Screening mammogram in one year.  Patient verbalizes understanding.   Routing to provider for final review. Patient is agreeable to disposition. Will close encounter.

## 2018-08-05 ENCOUNTER — Other Ambulatory Visit: Payer: Self-pay | Admitting: Internal Medicine

## 2018-08-11 ENCOUNTER — Other Ambulatory Visit: Payer: Self-pay | Admitting: Internal Medicine

## 2018-08-19 ENCOUNTER — Other Ambulatory Visit: Payer: Self-pay | Admitting: Internal Medicine

## 2018-08-22 ENCOUNTER — Other Ambulatory Visit: Payer: Self-pay | Admitting: Internal Medicine

## 2018-08-31 ENCOUNTER — Other Ambulatory Visit: Payer: Self-pay | Admitting: Nurse Practitioner

## 2018-08-31 ENCOUNTER — Other Ambulatory Visit: Payer: Self-pay

## 2018-08-31 ENCOUNTER — Encounter: Payer: Self-pay | Admitting: Nurse Practitioner

## 2018-08-31 ENCOUNTER — Ambulatory Visit (INDEPENDENT_AMBULATORY_CARE_PROVIDER_SITE_OTHER): Payer: Medicare Other | Admitting: Nurse Practitioner

## 2018-08-31 VITALS — BP 121/82 | HR 70 | Resp 16 | Ht 61.0 in | Wt 141.8 lb

## 2018-08-31 DIAGNOSIS — Z79899 Other long term (current) drug therapy: Secondary | ICD-10-CM | POA: Diagnosis not present

## 2018-08-31 DIAGNOSIS — Z0001 Encounter for general adult medical examination with abnormal findings: Secondary | ICD-10-CM | POA: Diagnosis not present

## 2018-08-31 DIAGNOSIS — E039 Hypothyroidism, unspecified: Secondary | ICD-10-CM | POA: Diagnosis not present

## 2018-08-31 DIAGNOSIS — J4540 Moderate persistent asthma, uncomplicated: Secondary | ICD-10-CM | POA: Diagnosis not present

## 2018-08-31 DIAGNOSIS — F411 Generalized anxiety disorder: Secondary | ICD-10-CM | POA: Diagnosis not present

## 2018-08-31 DIAGNOSIS — E559 Vitamin D deficiency, unspecified: Secondary | ICD-10-CM | POA: Diagnosis not present

## 2018-08-31 DIAGNOSIS — E782 Mixed hyperlipidemia: Secondary | ICD-10-CM | POA: Diagnosis not present

## 2018-08-31 DIAGNOSIS — Z23 Encounter for immunization: Secondary | ICD-10-CM | POA: Diagnosis not present

## 2018-08-31 DIAGNOSIS — G43709 Chronic migraine without aura, not intractable, without status migrainosus: Secondary | ICD-10-CM

## 2018-08-31 LAB — POCT URINE DRUG SCREEN
POC Amphetamine UR: NOT DETECTED
POC BENZODIAZEPINES UR: NOT DETECTED
POC Barbiturate UR: NOT DETECTED
POC COCAINE UR: NOT DETECTED
POC Ecstasy UR: NOT DETECTED
POC MARIJUANA UR: NOT DETECTED
POC METHADONE UR: NOT DETECTED
POC Methamphetamine UR: NOT DETECTED
POC OPIATE UR: NOT DETECTED
POC OXYCODONE UR: NOT DETECTED
POC PHENCYCLIDINE UR: NOT DETECTED
POC TRICYCLICS UR: NOT DETECTED

## 2018-08-31 MED ORDER — ALPRAZOLAM 0.25 MG PO TABS: 0.2500 mg | ORAL_TABLET | Freq: Two times a day (BID) | ORAL | 2 refills | Status: DC | PRN

## 2018-08-31 MED ORDER — ALBUTEROL SULFATE HFA 108 (90 BASE) MCG/ACT IN AERS
INHALATION_SPRAY | RESPIRATORY_TRACT | 1 refills | Status: DC
Start: 2018-08-31 — End: 2019-04-11

## 2018-08-31 MED ORDER — HYDROCODONE-ACETAMINOPHEN 5-300 MG PO TABS: 1.0000 | ORAL_TABLET | Freq: Two times a day (BID) | ORAL | 0 refills | Status: DC | PRN

## 2018-08-31 NOTE — Progress Notes (Signed)
[facility] [place], [place] [address]  Internal MEDICINE  Office Visit Note  Patient Name: Bonnie West  [id number]  [id number]  Date of Service: 09/01/2018  Chief Complaint  Patient presents with  . Medical Management of Chronic Issues    4 month follow up. pt concerned about back pain still existing  . Migraine  . Asthma  . Anxiety  . Hyperlipidemia  . Hypothyroidism    Asthma  She complains of cough and wheezing. There is no shortness of breath. Primary symptoms comments: She is feeling better afterher severe asthma flare up. This is a chronic problem. The current episode started more than 1 year ago. The problem occurs every several days. The problem has been unchanged. Associated symptoms include dyspnea on exertion and malaise/fatigue. Pertinent negatives include no chest pain, headaches, postnasal drip, rhinorrhea, sneezing or sore throat. Her symptoms are aggravated by exposure to fumes and exposure to smoke. Her symptoms are alleviated by beta-agonist, steroid inhaler, leukotriene antagonist and ipratropium. She reports moderate improvement on treatment. There are no known risk factors for lung disease. Her past medical history is significant for asthma.  Medication Refill  This is a chronic (pt needs her vicodin for pain control,. she only itakes it occasionally. ) problem. The current episode started more than 1 year ago. The problem occurs rarely. The problem has been unchanged. Associated symptoms include arthralgias, coughing, fatigue and joint swelling. Pertinent negatives include no abdominal pain, chest pain, chills, congestion, headaches, nausea, neck pain, numbness, rash, sore throat or vomiting. The symptoms are aggravated by exertion. She has tried acetaminophen, position changes, relaxation, oral narcotics and rest for the symptoms. The treatment provided moderate relief.       Current Medication: Outpatient Encounter Medications as of  08/31/2018  Medication Sig  . acetaminophen (TYLENOL) 500 MG tablet Take 1,000 mg by mouth 2 (two) times daily as needed for mild pain or moderate pain.  Marland Kitchen albuterol (PROVENTIL HFA;VENTOLIN HFA) 108 (90 Base) MCG/ACT inhaler INHALE 2 PUFFS BY MOUTH EVERY 6 HOURS  . ALPRAZolam (XANAX) 0.25 MG tablet Take 1 tablet (0.25 mg total) by mouth 2 (two) times daily as needed for anxiety.  Marland Kitchen amLODipine (NORVASC) 5 MG tablet Take 1 tablet (5 mg total) by mouth daily.  Marland Kitchen azelastine (ASTELIN) 0.1 % nasal spray Place 1 spray into both nostrils at bedtime as needed for rhinitis or allergies. Use in each nostril as directed  . betamethasone valerate ointment (VALISONE) 0.1 % Apply a pea sized amount topically bid x 1-2 weeks as needed (Patient taking differently: Apply 1 application topically 2 (two) times daily as needed. Apply a pea sized amount topically bid x 1-2 weeks as needed for skin breakdown)  . bisacodyl (DULCOLAX) 5 MG EC tablet Take 5-10 mg by mouth at bedtime as needed for moderate constipation.  . cetirizine (ZYRTEC) 10 MG tablet Take 10 mg by mouth daily.  . cholecalciferol (VITAMIN D) 1000 units tablet Take 1,000 Units by mouth at bedtime.  . clindamycin (CLEOCIN T) 1 % external solution Apply 1 application topically 2 (two) times daily as needed. For skin breakdown  . cyclobenzaprine (FLEXERIL) 5 MG tablet Take 1 tablet (5 mg total) by mouth 2 (two) times daily as needed for muscle spasms.  . diclofenac sodium (VOLTAREN) 1 % GEL APPLY 2 GRAMS TO AFFECTED AREA 4 TIMES A DAY  . docusate sodium (COLACE) 100 MG capsule Take 100-200 mg by mouth at bedtime as needed for mild constipation  or moderate constipation.   Marland Kitchen estradiol (ESTRACE) 0.1 MG/GM vaginal cream 1 gram vaginally twice weekly at bedtime  . fenofibrate 160 MG tablet TAKE 1 TABLET BY MOUTH EVERY DAY  . FLOVENT HFA 110 MCG/ACT inhaler INHALE 2 PUFFS BY MOUTH EVERY 12 HOURS  . FLUoxetine (PROZAC) 40 MG capsule TAKE 1 CAPSULE BY MOUTH EVERY  DAY  . HYDROcodone-Acetaminophen 5-300 MG TABS Take 1 tablet by mouth 2 (two) times daily as needed. For severe pain  . ipratropium (ATROVENT HFA) 17 MCG/ACT inhaler Inhale 2 puffs into the lungs 2 (two) times daily.   Marland Kitchen ipratropium (ATROVENT) 0.02 % nebulizer solution Inhale into the lungs.  . isometheptene-acetaminophen-dichloralphenazone (MIDRIN) 65-100-325 MG capsule Take 1 capsule by mouth 4 (four) times daily as needed for migraine. Maximum 5 capsules in 12 hours for migraine headaches, 8 capsules in 24 hours for tension headaches.  . levothyroxine (SYNTHROID, LEVOTHROID) 100 MCG tablet Take 1 tablet (100 mcg total) by mouth every morning.  Marland Kitchen lisinopril (PRINIVIL,ZESTRIL) 10 MG tablet TAKE 1 TABLET BY MOUTH EVERY DAY  . magnesium oxide (MAG-OX) 400 MG tablet Take 400 mg by mouth at bedtime.   . montelukast (SINGULAIR) 10 MG tablet Take 1 tablet (10 mg total) by mouth at bedtime. (Patient taking differently: Take 10 mg by mouth daily. )  . omeprazole (PRILOSEC) 40 MG capsule Take 40 mg by mouth daily.  Marland Kitchen triamcinolone cream (KENALOG) 0.1 % Apply 1 application topically 2 (two) times daily as needed. For skin breakdown  . vitamin B-12 (CYANOCOBALAMIN) 1000 MCG tablet Take 1,000 mcg by mouth daily.  . [DISCONTINUED] ALPRAZolam (XANAX) 0.25 MG tablet Take 1 tablet (0.25 mg total) by mouth 2 (two) times daily as needed for anxiety.  . [DISCONTINUED] chlorpheniramine-HYDROcodone (TUSSIONEX) 10-8 MG/5ML SUER Take 5 mLs by mouth every 12 (twelve) hours as needed for cough.  . [DISCONTINUED] HYDROcodone-Acetaminophen 5-300 MG TABS Take 1 tablet by mouth 2 (two) times daily as needed. For severe pain   No facility-administered encounter medications on file as of 08/31/2018.     Surgical History: Past Surgical History:  Procedure Laterality Date  . BUNIONECTOMY    . CATARACT EXTRACTION W/PHACO Right 10/11/2016   Procedure: CATARACT EXTRACTION PHACO AND INTRAOCULAR LENS PLACEMENT (IOC);  Surgeon:  Birder Robson, MD;  Location: [facility] ORS;  Service: Ophthalmology;  Laterality: Right;  Lot# [id number] H [place]: 00:37.7 AP%: 18.1 CDE: 6.80  . CATARACT EXTRACTION W/PHACO Left 11/08/2016   Procedure: CATARACT EXTRACTION PHACO AND INTRAOCULAR LENS PLACEMENT (IOC);  Surgeon: Birder Robson, MD;  Location: [facility] ORS;  Service: Ophthalmology;  Laterality: Left;  PACK LOT: [id number] H US:00:32 AP:44 CDE:6.46  . COLONOSCOPY    . COLONOSCOPY WITH PROPOFOL N/A 11/26/2015   Procedure: COLONOSCOPY WITH PROPOFOL;  Surgeon: Manya Silvas, MD;  Location: [facility] ENDOSCOPY;  Service: Endoscopy;  Laterality: N/A;  . EYE SURGERY    . TONSILLECTOMY      Medical History: Past Medical History:  Diagnosis Date  . Allergic rhinitis   . Anxiety   . Asthma   . Blood transfusion without reported diagnosis   . Depression   . Diverticulosis   . Dyspnea   . Esophagitis   . GERD (gastroesophageal reflux disease)   . Headache   . Heart murmur   . Hyperlipemia   . Hypertension   . Hypothyroidism   . Obesity   . Osteopenia   . Palpitations   . Pneumothorax   . Sleep apnea     Family History: Family History  Problem Relation Age of Onset  . Breast cancer Maternal Grandmother   . Stroke Maternal Grandmother   . Bladder Cancer Father   . Prostate cancer Father   . Heart attack Father   . Aortic aneurysm Father   . Congestive Heart Failure Father   . Colon cancer Paternal Grandmother   . Aneurysm Paternal Grandmother   . Stroke Paternal Grandmother   . Congestive Heart Failure Paternal Grandmother   . Dementia Mother   . Stroke Mother   . Dementia Maternal Grandfather   . Stroke Maternal Grandfather   . Dementia Paternal Grandfather   . Stroke Paternal Grandfather     Social History   Socioeconomic History  . Marital status: Married    Spouse name: Not on file  . Number of children: Not on file  . Years of education: Not on file  . Highest education level: Not on file  Occupational History   . Not on file  Social Needs  . Financial resource strain: Not on file  . Food insecurity:    Worry: Not on file    Inability: Not on file  . Transportation needs:    Medical: Not on file    Non-medical: Not on file  Tobacco Use  . Smoking status: Former Research scientist (life sciences)  . Smokeless tobacco: Never Used  Substance and Sexual Activity  . Alcohol use: No  . Drug use: No  . Sexual activity: Never    Partners: Male    Birth control/protection: Post-menopausal  Lifestyle  . Physical activity:    Days per week: Not on file    Minutes per session: Not on file  . Stress: Not on file  Relationships  . Social connections:    Talks on phone: Not on file    Gets together: Not on file    Attends religious service: Not on file    Active member of club or organization: Not on file    Attends meetings of clubs or organizations: Not on file    Relationship status: Not on file  . Intimate partner violence:    Fear of current or ex partner: Not on file    Emotionally abused: Not on file    Physically abused: Not on file    Forced sexual activity: Not on file  Other Topics Concern  . Not on file  Social History Narrative  . Not on file      Review of Systems  Constitutional: Positive for fatigue and malaise/fatigue. Negative for activity change, chills and unexpected weight change.  HENT: Negative for congestion, postnasal drip, rhinorrhea, sneezing, sore throat and voice change.   Eyes: Negative.  Negative for redness.  Respiratory: Positive for cough and wheezing. Negative for chest tightness and shortness of breath.        Intermittent  Cardiovascular: Positive for dyspnea on exertion. Negative for chest pain and palpitations.  Gastrointestinal: Negative for abdominal pain, constipation, diarrhea, nausea and vomiting.  Endocrine: Negative for cold intolerance, heat intolerance, polydipsia, polyphagia and polyuria.  Genitourinary: Negative.  Negative for dysuria and frequency.   Musculoskeletal: Positive for arthralgias and joint swelling. Negative for back pain and neck pain.  Skin: Negative for rash.  Allergic/Immunologic: Positive for environmental allergies.  Neurological: Negative for dizziness, tremors, numbness and headaches.  Hematological: Negative for adenopathy. Does not bruise/bleed easily.  Psychiatric/Behavioral: Positive for dysphoric mood. Negative for behavioral problems (Depression), sleep disturbance and suicidal ideas. The patient is nervous/anxious.     Today's Vitals   08/31/18  1205  BP: 121/82  Pulse: 70  Resp: 16  SpO2: 98%  Weight: 141 lb 12.8 oz (64.3 kg)  Height: 5\' 1"  (1.549 m)   Physical Exam  Constitutional: She is oriented to person, place, and time. She appears well-developed and well-nourished. No distress.  HENT:  Head: Normocephalic and atraumatic.  Nose: Nose normal.  Mouth/Throat: Oropharynx is clear and moist. No oropharyngeal exudate.  Eyes: Pupils are equal, round, and reactive to light. Conjunctivae and EOM are normal.  Neck: Normal range of motion. Neck supple. No JVD present. No tracheal deviation present. No thyromegaly present.  Cardiovascular: Normal rate, regular rhythm and normal heart sounds. Exam reveals no gallop and no friction rub.  No murmur heard. Pulmonary/Chest: Effort normal and breath sounds normal. No respiratory distress. She has no wheezes. She has no rales. She exhibits no tenderness.  Abdominal: Soft. Bowel sounds are normal. There is no tenderness.  Musculoskeletal: Normal range of motion.  Lymphadenopathy:    She has no cervical adenopathy.  Neurological: She is alert and oriented to person, place, and time. No cranial nerve deficit.  Skin: Skin is warm and dry. She is not diaphoretic.  Bruise on right breast   Psychiatric: She has a normal mood and affect. Her behavior is normal. Judgment and thought content normal.  Nursing note and vitals reviewed.  Assessment/Plan: 1. Moderate  persistent asthma without status asthmaticus without complication Currently well managed. Continue inhalers and respiratory medications as prescribed.   2. Hypothyroidism, unspecified type Check thyroid panel and adjust levothyroxine as indicated.   3. GAD (generalized anxiety disorder) May continue alprazolam 0.25mg  up to twice daily if needed for acute anxiety. New prescription provided today.  - ALPRAZolam (XANAX) 0.25 MG tablet; Take 1 tablet (0.25 mg total) by mouth 2 (two) times daily as needed for anxiety.  Dispense: 60 tablet; Refill: 2  4. Chronic migraine without aura without status migrainosus, not intractable New prescription for hydrocodone/APAP 5/300 mg tablets which are taken rarely. New prescription for #15 tablets was sent to her pharmacy.  - HYDROcodone-Acetaminophen 5-300 MG TABS; Take 1 tablet by mouth 2 (two) times daily as needed. For severe pain  Dispense: 15 each; Refill: 0  5. Encounter for long-term (current) use of medications UDS negative for all controlled substances, which is expected, as she states she has not had alprazolam or vicodin in some time.  - POCT Urine Drug Screen  General Counseling: Shonika verbalizes understanding of the findings of todays visit and agrees with plan of treatment. I have discussed any further diagnostic evaluation that may be needed or ordered today. We also reviewed her medications today. she has been encouraged to call the office with any questions or concerns that should arise related to todays visit.  Reviewed risks and possible side effects associated with taking opiates, benzodiazepines and other CNS depressants. Combination of these could cause dizziness and drowsiness. Advised patient not to drive or operate machinery when taking these medications, as patient's and other's life can be at risk and will have consequences. Patient verbalized understanding in this matter. Dependence and abuse for these drugs will be monitored closely.  A Controlled substance policy and procedure is on file which allows [facility] to order a urine drug screen test at any visit. Patient understands and agrees with the plan  This patient was seen by Leretha Pol FNP Collaboration with Dr Lavera Guise as a part of collaborative care agreement  Orders Placed This Encounter  Procedures  .  POCT Urine Drug Screen    Meds ordered this encounter  Medications  . ALPRAZolam (XANAX) 0.25 MG tablet    Sig: Take 1 tablet (0.25 mg total) by mouth 2 (two) times daily as needed for anxiety.    Dispense:  60 tablet    Refill:  2    Order Specific Question:   Supervising Provider    Answer:   Lavera Guise [[id number]]  . HYDROcodone-Acetaminophen 5-300 MG TABS    Sig: Take 1 tablet by mouth 2 (two) times daily as needed. For severe pain    Dispense:  15 each    Refill:  0    Order Specific Question:   Supervising Provider    Answer:   Lavera Guise [[id number]]    Time spent: 39 Minutes      Dr Lavera Guise Internal medicine

## 2018-09-01 DIAGNOSIS — F411 Generalized anxiety disorder: Secondary | ICD-10-CM | POA: Insufficient documentation

## 2018-09-01 DIAGNOSIS — G43709 Chronic migraine without aura, not intractable, without status migrainosus: Secondary | ICD-10-CM | POA: Insufficient documentation

## 2018-09-01 DIAGNOSIS — Z79899 Other long term (current) drug therapy: Secondary | ICD-10-CM | POA: Insufficient documentation

## 2018-09-01 LAB — COMPREHENSIVE METABOLIC PANEL
ALT: 17 IU/L (ref 0–32)
AST: 28 IU/L (ref 0–40)
Albumin/Globulin Ratio: 2.4 — ABNORMAL HIGH (ref 1.2–2.2)
Albumin: 5 g/dL — ABNORMAL HIGH (ref 3.6–4.8)
Alkaline Phosphatase: 49 IU/L (ref 39–117)
BILIRUBIN TOTAL: 0.4 mg/dL (ref 0.0–1.2)
BUN / CREAT RATIO: 23 (ref 12–28)
BUN: 20 mg/dL (ref 8–27)
CALCIUM: 10.4 mg/dL — AB (ref 8.7–10.3)
CO2: 20 mmol/L (ref 20–29)
Chloride: 100 mmol/L (ref 96–106)
Creatinine, Ser: 0.88 mg/dL (ref 0.57–1.00)
GFR, EST AFRICAN AMERICAN: 78 mL/min/{1.73_m2} (ref >59)
GFR, EST NON AFRICAN AMERICAN: 68 mL/min/{1.73_m2} (ref >59)
GLUCOSE: 85 mg/dL (ref 65–99)
Globulin, Total: 2.1 g/dL (ref 1.5–4.5)
Potassium: 4.7 mmol/L (ref 3.5–5.2)
Sodium: 140 mmol/L (ref 134–144)
TOTAL PROTEIN: 7.1 g/dL (ref 6.0–8.5)

## 2018-09-01 LAB — CBC
HEMATOCRIT: 42.6 % (ref 34.0–46.6)
HEMOGLOBIN: 14.5 g/dL (ref 11.1–15.9)
MCH: 31.9 pg (ref 26.6–33.0)
MCHC: 34 g/dL (ref 31.5–35.7)
MCV: 94 fL (ref 79–97)
Platelets: 349 10*3/uL (ref 150–450)
RBC: 4.54 x10E6/uL (ref 3.77–5.28)
RDW: 11.8 % — AB (ref 12.3–15.4)
WBC: 7.1 10*3/uL (ref 3.4–10.8)

## 2018-09-01 LAB — LIPID PANEL W/O CHOL/HDL RATIO
Cholesterol, Total: 210 mg/dL — ABNORMAL HIGH (ref 100–199)
HDL: 52 mg/dL (ref >39)
LDL CALC: 134 mg/dL — AB (ref 0–99)
Triglycerides: 120 mg/dL (ref 0–149)
VLDL Cholesterol Cal: 24 mg/dL (ref 5–40)

## 2018-09-01 LAB — TSH: TSH: 3.5 u[IU]/mL (ref 0.450–4.500)

## 2018-09-01 LAB — T4, FREE: FREE T4: 1.76 ng/dL (ref 0.82–1.77)

## 2018-09-01 LAB — VITAMIN D 25 HYDROXY (VIT D DEFICIENCY, FRACTURES): Vit D, 25-Hydroxy: 38.9 ng/mL (ref 30.0–100.0)

## 2018-09-01 LAB — T3: T3, Total: 69 ng/dL — ABNORMAL LOW (ref 71–180)

## 2018-09-05 ENCOUNTER — Telehealth: Payer: Self-pay

## 2018-09-05 NOTE — Telephone Encounter (Signed)
Please let her know that her labs looked good and please print out a copy and we can mail to her. Thanks.

## 2018-09-05 NOTE — Telephone Encounter (Signed)
Pt advised labs looked good and mailed labs to pt

## 2018-09-14 DIAGNOSIS — H26493 Other secondary cataract, bilateral: Secondary | ICD-10-CM | POA: Diagnosis not present

## 2018-09-18 NOTE — Progress Notes (Signed)
GYNECOLOGY  VISIT   HPI: 67 y.o.   Married White or Caucasian Not Hispanic or Latino  female   [id number] with No LMP recorded. Patient is postmenopausal.   here for for 3 month recheck of pessary. She has a h/o grade 2 uterine prolapse and a mild cystocele. Symptoms are well controlled with a pessary. She uses vaginal estrogen 1-2 x a week. No vaginal bleeding, no abnormal d/c. Pessary is working well.  She c/o intermittent issues with folliculitis, currently having a flare for the last month. She has seen dermatology for this.   GYNECOLOGIC HISTORY: No LMP recorded. Patient is postmenopausal. Contraception:Post menopausal Menopausal hormone therapy:         OB History    Gravida  1   Para  1   Term  1   Preterm      AB      Living  1     SAB      TAB      Ectopic      Multiple      Live Births  1              Patient Active Problem List   Diagnosis Date Noted  . GAD (generalized anxiety disorder) 09/01/2018  . Chronic migraine without aura without status migrainosus, not intractable 09/01/2018  . Encounter for long-term (current) use of medications 09/01/2018  . Asthma without status asthmaticus 01/30/2018  . Hyperlipidemia, unspecified 01/30/2018  . Hypertension 01/30/2018  . Obesity, unspecified 01/30/2018  . Hypothyroidism 01/30/2018  . Acute respiratory failure with hypoxia ([place]) 12/31/2017  . Fatty infiltration of liver 12/10/2015  . Abdominal pain, LLQ (left lower quadrant) 11/03/2015  . History of adenomatous polyp of colon 11/03/2015  . Hallux valgus, left 09/18/2014  . 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  . Shoulder arthritis 12/19/2013    Past Medical History:  Diagnosis Date  . Allergic rhinitis   . Anxiety   . Asthma   . Blood transfusion without reported diagnosis   . Depression   . Diverticulosis   . Dyspnea   . Esophagitis   . GERD (gastroesophageal reflux disease)   . Headache   . Heart murmur   . Hyperlipemia   .  Hypertension   . Hypothyroidism   . Obesity   . Osteopenia   . Palpitations   . Pneumothorax   . Sleep apnea     Past Surgical History:  Procedure Laterality Date  . BUNIONECTOMY    . CATARACT EXTRACTION W/PHACO Right 10/11/2016   Procedure: CATARACT EXTRACTION PHACO AND INTRAOCULAR LENS PLACEMENT (IOC);  Surgeon: Birder Robson, MD;  Location: [facility] ORS;  Service: Ophthalmology;  Laterality: Right;  Lot# [id number] H [place]: 00:37.7 AP%: 18.1 CDE: 6.80  . CATARACT EXTRACTION W/PHACO Left 11/08/2016   Procedure: CATARACT EXTRACTION PHACO AND INTRAOCULAR LENS PLACEMENT (IOC);  Surgeon: Birder Robson, MD;  Location: [facility] ORS;  Service: Ophthalmology;  Laterality: Left;  PACK LOT: [id number] H US:00:32 AP:44 CDE:6.46  . COLONOSCOPY    . COLONOSCOPY WITH PROPOFOL N/A 11/26/2015   Procedure: COLONOSCOPY WITH PROPOFOL;  Surgeon: Manya Silvas, MD;  Location: [facility] ENDOSCOPY;  Service: Endoscopy;  Laterality: N/A;  . EYE SURGERY    . TONSILLECTOMY      Current Outpatient Medications  Medication Sig Dispense Refill  . acetaminophen (TYLENOL) 500 MG tablet Take 1,000 mg by mouth 2 (two) times daily as needed for mild pain or moderate pain.    Marland Kitchen albuterol (PROVENTIL HFA;VENTOLIN HFA) 108 (90 Base)  MCG/ACT inhaler INHALE 2 PUFFS BY MOUTH EVERY 6 HOURS 18 Inhaler 1  . ALPRAZolam (XANAX) 0.25 MG tablet Take 1 tablet (0.25 mg total) by mouth 2 (two) times daily as needed for anxiety. 60 tablet 2  . amLODipine (NORVASC) 5 MG tablet Take 1 tablet (5 mg total) by mouth daily. 90 tablet 3  . azelastine (ASTELIN) 0.1 % nasal spray Place 1 spray into both nostrils at bedtime as needed for rhinitis or allergies. Use in each nostril as directed    . betamethasone valerate ointment (VALISONE) 0.1 % Apply a pea sized amount topically bid x 1-2 weeks as needed (Patient taking differently: Apply 1 application topically 2 (two) times daily as needed. Apply a pea sized amount topically bid x 1-2 weeks as needed for  skin breakdown) 15 g 0  . bisacodyl (DULCOLAX) 5 MG EC tablet Take 5-10 mg by mouth at bedtime as needed for moderate constipation.    . cetirizine (ZYRTEC) 10 MG tablet Take 10 mg by mouth daily.    . cholecalciferol (VITAMIN D) 1000 units tablet Take 1,000 Units by mouth at bedtime.    . clindamycin (CLEOCIN T) 1 % external solution Apply 1 application topically 2 (two) times daily as needed. For skin breakdown 30 mL 2  . cyclobenzaprine (FLEXERIL) 5 MG tablet Take 1 tablet (5 mg total) by mouth 2 (two) times daily as needed for muscle spasms. 60 tablet 1  . diclofenac sodium (VOLTAREN) 1 % GEL APPLY 2 GRAMS TO AFFECTED AREA 4 TIMES A DAY  11  . docusate sodium (COLACE) 100 MG capsule Take 100-200 mg by mouth at bedtime as needed for mild constipation or moderate constipation.     Marland Kitchen estradiol (ESTRACE) 0.1 MG/GM vaginal cream 1 gram vaginally twice weekly at bedtime 42.5 g 1  . fenofibrate 160 MG tablet TAKE 1 TABLET BY MOUTH EVERY DAY 90 tablet 1  . FLOVENT HFA 110 MCG/ACT inhaler INHALE 2 PUFFS BY MOUTH EVERY 12 HOURS 12 Inhaler 1  . FLUoxetine (PROZAC) 40 MG capsule TAKE 1 CAPSULE BY MOUTH EVERY DAY 90 capsule 1  . HYDROcodone-Acetaminophen 5-300 MG TABS Take 1 tablet by mouth 2 (two) times daily as needed. For severe pain 15 each 0  . ipratropium (ATROVENT HFA) 17 MCG/ACT inhaler Inhale 2 puffs into the lungs 2 (two) times daily.     Marland Kitchen ipratropium (ATROVENT) 0.02 % nebulizer solution Inhale into the lungs.    . isometheptene-acetaminophen-dichloralphenazone (MIDRIN) 65-100-325 MG capsule Take 1 capsule by mouth 4 (four) times daily as needed for migraine. Maximum 5 capsules in 12 hours for migraine headaches, 8 capsules in 24 hours for tension headaches.    . levothyroxine (SYNTHROID, LEVOTHROID) 100 MCG tablet Take 1 tablet (100 mcg total) by mouth every morning. 90 tablet 3  . lisinopril (PRINIVIL,ZESTRIL) 10 MG tablet TAKE 1 TABLET BY MOUTH EVERY DAY 90 tablet 1  . magnesium oxide (MAG-OX)  400 MG tablet Take 400 mg by mouth at bedtime.     . montelukast (SINGULAIR) 10 MG tablet Take 1 tablet (10 mg total) by mouth at bedtime. (Patient taking differently: Take 10 mg by mouth daily. ) 90 tablet 3  . omeprazole (PRILOSEC) 40 MG capsule Take 40 mg by mouth daily.    Marland Kitchen triamcinolone cream (KENALOG) 0.1 % Apply 1 application topically 2 (two) times daily as needed. For skin breakdown    . vitamin B-12 (CYANOCOBALAMIN) 1000 MCG tablet Take 1,000 mcg by mouth daily.  No current facility-administered medications for this visit.      ALLERGIES: Advair diskus [fluticasone-salmeterol]; Codeine; Erythromycin; Morphine and related; Nsaids; and Sulfa antibiotics  Family History  Problem Relation Age of Onset  . Breast cancer Maternal Grandmother   . Stroke Maternal Grandmother   . Bladder Cancer Father   . Prostate cancer Father   . Heart attack Father   . Aortic aneurysm Father   . Congestive Heart Failure Father   . Colon cancer Paternal Grandmother   . Aneurysm Paternal Grandmother   . Stroke Paternal Grandmother   . Congestive Heart Failure Paternal Grandmother   . Dementia Mother   . Stroke Mother   . Dementia Maternal Grandfather   . Stroke Maternal Grandfather   . Dementia Paternal Grandfather   . Stroke Paternal Grandfather     Social History   Socioeconomic History  . Marital status: Married    Spouse name: Not on file  . Number of children: Not on file  . Years of education: Not on file  . Highest education level: Not on file  Occupational History  . Not on file  Social Needs  . Financial resource strain: Not on file  . Food insecurity:    Worry: Not on file    Inability: Not on file  . Transportation needs:    Medical: Not on file    Non-medical: Not on file  Tobacco Use  . Smoking status: Former Research scientist (life sciences)  . Smokeless tobacco: Never Used  Substance and Sexual Activity  . Alcohol use: No  . Drug use: No  . Sexual activity: Not Currently    Partners:  Male    Birth control/protection: Post-menopausal  Lifestyle  . Physical activity:    Days per week: Not on file    Minutes per session: Not on file  . Stress: Not on file  Relationships  . Social connections:    Talks on phone: Not on file    Gets together: Not on file    Attends religious service: Not on file    Active member of club or organization: Not on file    Attends meetings of clubs or organizations: Not on file    Relationship status: Not on file  . Intimate partner violence:    Fear of current or ex partner: Not on file    Emotionally abused: Not on file    Physically abused: Not on file    Forced sexual activity: Not on file  Other Topics Concern  . Not on file  Social History Narrative  . Not on file    Review of Systems  Constitutional: Negative.   Eyes: Negative.   Respiratory: Negative.   Cardiovascular: Negative.   Gastrointestinal: Negative.   Genitourinary: Negative.   Musculoskeletal: Negative.   Skin: Positive for rash.  Neurological: Negative.   Endo/Heme/Allergies: Negative.   Psychiatric/Behavioral: Negative.     PHYSICAL EXAMINATION:    BP 128/82 (BP Location: Right Arm, Patient Position: Sitting, Cuff Size: Normal)   Pulse 72   Wt 141 lb 3.2 oz (64 kg)   BMI 26.68 kg/m     General appearance: alert, cooperative and appears stated age   Pelvic: External genitalia:  no lesions              Urethra:  normal appearing urethra with no masses, tenderness or lesions              Bartholins and Skenes: normal  Vagina: normal appearing vagina with normal color and discharge, no lesions  Pessary removed and cleaned, mild vaginal erythema posterior vaginal wall, just below the cervix. 1 gram of estrace cream placed. Pessary replaced              Cervix: no lesions  Skin: on her left buttock are 2 healing ulcers. She points to several other red spots on her upper buttock and legs that are erythematous spots (states this is how it  starts)               Chaperone was present for exam.  ASSESSMENT Genital prolapse, well controlled with the pessary Mild vaginal irritation, not using her estrogen cream consistently Skin irritation, healing ulcers on her buttock.      PLAN Increase use of estrogen cream to 2 x a week F/U with dermatology for her rash/ulcerations. Doesn't look like HSV Discussed loose fitting clothing, vaseline as a skin protectant F/u in 2 months   An After Visit Summary was printed and given to the patient.  ~15 minutes face to face time of which over 50% was spent in counseling.

## 2018-09-19 ENCOUNTER — Ambulatory Visit (INDEPENDENT_AMBULATORY_CARE_PROVIDER_SITE_OTHER): Payer: Medicare Other | Admitting: Obstetrics and Gynecology

## 2018-09-19 ENCOUNTER — Encounter: Payer: Self-pay | Admitting: Obstetrics and Gynecology

## 2018-09-19 ENCOUNTER — Other Ambulatory Visit: Payer: Self-pay

## 2018-09-19 VITALS — BP 128/82 | HR 72 | Wt 141.2 lb

## 2018-09-19 DIAGNOSIS — Z4689 Encounter for fitting and adjustment of other specified devices: Secondary | ICD-10-CM

## 2018-09-19 DIAGNOSIS — R238 Other skin changes: Secondary | ICD-10-CM

## 2018-09-19 DIAGNOSIS — N898 Other specified noninflammatory disorders of vagina: Secondary | ICD-10-CM

## 2018-10-09 ENCOUNTER — Other Ambulatory Visit: Payer: Self-pay | Admitting: Internal Medicine

## 2018-10-24 ENCOUNTER — Ambulatory Visit: Payer: Medicare Other | Admitting: Obstetrics and Gynecology

## 2018-10-30 ENCOUNTER — Encounter: Payer: Self-pay | Admitting: Adult Health

## 2018-10-30 ENCOUNTER — Ambulatory Visit (INDEPENDENT_AMBULATORY_CARE_PROVIDER_SITE_OTHER): Payer: Medicare Other | Admitting: Adult Health

## 2018-10-30 VITALS — BP 110/62 | HR 75 | Resp 16 | Ht 61.0 in | Wt 138.0 lb

## 2018-10-30 DIAGNOSIS — G4733 Obstructive sleep apnea (adult) (pediatric): Secondary | ICD-10-CM

## 2018-10-30 DIAGNOSIS — G8929 Other chronic pain: Secondary | ICD-10-CM

## 2018-10-30 DIAGNOSIS — F411 Generalized anxiety disorder: Secondary | ICD-10-CM | POA: Diagnosis not present

## 2018-10-30 DIAGNOSIS — Z9989 Dependence on other enabling machines and devices: Secondary | ICD-10-CM

## 2018-10-30 DIAGNOSIS — I10 Essential (primary) hypertension: Secondary | ICD-10-CM

## 2018-10-30 DIAGNOSIS — E039 Hypothyroidism, unspecified: Secondary | ICD-10-CM | POA: Diagnosis not present

## 2018-10-30 DIAGNOSIS — E782 Mixed hyperlipidemia: Secondary | ICD-10-CM

## 2018-10-30 DIAGNOSIS — Z0001 Encounter for general adult medical examination with abnormal findings: Secondary | ICD-10-CM

## 2018-10-30 DIAGNOSIS — R300 Dysuria: Secondary | ICD-10-CM | POA: Diagnosis not present

## 2018-10-30 DIAGNOSIS — M545 Low back pain, unspecified: Secondary | ICD-10-CM

## 2018-10-30 DIAGNOSIS — J4520 Mild intermittent asthma, uncomplicated: Secondary | ICD-10-CM | POA: Diagnosis not present

## 2018-10-30 DIAGNOSIS — G43709 Chronic migraine without aura, not intractable, without status migrainosus: Secondary | ICD-10-CM | POA: Diagnosis not present

## 2018-10-30 MED ORDER — CYCLOBENZAPRINE HCL 5 MG PO TABS: 5.0000 mg | ORAL_TABLET | Freq: Two times a day (BID) | ORAL | 1 refills | Status: DC | PRN

## 2018-10-30 MED ORDER — HYDROCODONE-ACETAMINOPHEN 5-300 MG PO TABS: 1.0000 | ORAL_TABLET | Freq: Two times a day (BID) | ORAL | 0 refills | Status: DC | PRN

## 2018-10-30 NOTE — Progress Notes (Signed)
[facility] [place], [place] [address]  Internal MEDICINE  Office Visit Note  Patient Name: Bonnie West  [id number]  [id number]  Date of Service: 10/30/2018  Chief Complaint  Patient presents with  . Annual Exam  . Hypertension  . Hyperlipidemia  . Depression  . Anxiety  . Migraine    come in clusters      HPI Pt is here for routine health maintenance examination.  Patient is a well-appearing 67 year old Caucasian female.  She has a history of hypertension, hyperlipidemia, depression, anxiety, and migraines.  She also suffers from chronic back pain.  Today she reports she is generally doing well however she is requesting some medication refills at this time.  She denies any tobacco, alcohol, or illicit drug use.  Current Medication: Outpatient Encounter Medications as of 10/30/2018  Medication Sig  . acetaminophen (TYLENOL) 500 MG tablet Take 1,000 mg by mouth 2 (two) times daily as needed for mild pain or moderate pain.  Marland Kitchen albuterol (PROVENTIL HFA;VENTOLIN HFA) 108 (90 Base) MCG/ACT inhaler INHALE 2 PUFFS BY MOUTH EVERY 6 HOURS  . ALPRAZolam (XANAX) 0.25 MG tablet Take 1 tablet (0.25 mg total) by mouth 2 (two) times daily as needed for anxiety.  Marland Kitchen amLODipine (NORVASC) 5 MG tablet Take 1 tablet (5 mg total) by mouth daily.  . bisacodyl (DULCOLAX) 5 MG EC tablet Take 5-10 mg by mouth at bedtime as needed for moderate constipation. Two at night  . cetirizine (ZYRTEC) 10 MG tablet Take 10 mg by mouth daily.  . cholecalciferol (VITAMIN D) 1000 units tablet Take 1,000 Units by mouth at bedtime.  . clindamycin (CLEOCIN T) 1 % external solution Apply 1 application topically 2 (two) times daily as needed. For skin breakdown  . cyclobenzaprine (FLEXERIL) 5 MG tablet Take 1-2 tablets (5-10 mg total) by mouth 2 (two) times daily as needed for muscle spasms.  Marland Kitchen docusate sodium (COLACE) 100 MG capsule Take 100-200 mg by mouth at bedtime as needed for mild  constipation or moderate constipation.   Marland Kitchen estradiol (ESTRACE) 0.1 MG/GM vaginal cream 1 gram vaginally twice weekly at bedtime  . fenofibrate 160 MG tablet TAKE 1 TABLET BY MOUTH EVERY DAY  . FLOVENT HFA 110 MCG/ACT inhaler INHALE 2 PUFFS BY MOUTH EVERY 12 HOURS  . FLUoxetine (PROZAC) 40 MG capsule TAKE 1 CAPSULE BY MOUTH EVERY DAY  . HYDROcodone-Acetaminophen 5-300 MG TABS Take 1 tablet by mouth 2 (two) times daily as needed. For severe pain  . ipratropium (ATROVENT HFA) 17 MCG/ACT inhaler Inhale 2 puffs into the lungs 2 (two) times daily.   Marland Kitchen ipratropium (ATROVENT) 0.02 % nebulizer solution Inhale into the lungs.  . isometheptene-acetaminophen-dichloralphenazone (MIDRIN) 65-100-325 MG capsule Take 1 capsule by mouth 4 (four) times daily as needed for migraine. Maximum 5 capsules in 12 hours for migraine headaches, 8 capsules in 24 hours for tension headaches.  . levothyroxine (SYNTHROID, LEVOTHROID) 100 MCG tablet Take 1 tablet (100 mcg total) by mouth every morning.  Marland Kitchen lisinopril (PRINIVIL,ZESTRIL) 10 MG tablet TAKE 1 TABLET BY MOUTH EVERY DAY  . magnesium oxide (MAG-OX) 400 MG tablet Take 400 mg by mouth at bedtime.   . montelukast (SINGULAIR) 10 MG tablet Take 1 tablet (10 mg total) by mouth at bedtime. (Patient taking differently: Take 10 mg by mouth daily. )  . omeprazole (PRILOSEC) 40 MG capsule Take 40 mg by mouth daily.  Marland Kitchen triamcinolone cream (KENALOG) 0.1 % Apply 1 application topically 2 (two) times daily as  needed. For skin breakdown  . vitamin B-12 (CYANOCOBALAMIN) 1000 MCG tablet Take 1,000 mcg by mouth daily.  . [DISCONTINUED] cyclobenzaprine (FLEXERIL) 5 MG tablet Take 1 tablet (5 mg total) by mouth 2 (two) times daily as needed for muscle spasms.  . [DISCONTINUED] HYDROcodone-Acetaminophen 5-300 MG TABS Take 1 tablet by mouth 2 (two) times daily as needed. For severe pain  . diclofenac sodium (VOLTAREN) 1 % GEL APPLY 2 GRAMS TO AFFECTED AREA 4 TIMES A DAY  . [DISCONTINUED]  azelastine (ASTELIN) 0.1 % nasal spray Place 1 spray into both nostrils at bedtime as needed for rhinitis or allergies. Use in each nostril as directed  . [DISCONTINUED] betamethasone valerate ointment (VALISONE) 0.1 % Apply a pea sized amount topically bid x 1-2 weeks as needed (Patient not taking: Reported on 10/30/2018)  . [DISCONTINUED] FLOVENT HFA 110 MCG/ACT inhaler TAKE 1 PUFF BY MOUTH TWICE A DAY (Patient not taking: Reported on 10/30/2018)   No facility-administered encounter medications on file as of 10/30/2018.     Surgical History: Past Surgical History:  Procedure Laterality Date  . BUNIONECTOMY    . CATARACT EXTRACTION W/PHACO Right 10/11/2016   Procedure: CATARACT EXTRACTION PHACO AND INTRAOCULAR LENS PLACEMENT (IOC);  Surgeon: Birder Robson, MD;  Location: [facility] ORS;  Service: Ophthalmology;  Laterality: Right;  Lot# [id number] H [place]: 00:37.7 AP%: 18.1 CDE: 6.80  . CATARACT EXTRACTION W/PHACO Left 11/08/2016   Procedure: CATARACT EXTRACTION PHACO AND INTRAOCULAR LENS PLACEMENT (IOC);  Surgeon: Birder Robson, MD;  Location: [facility] ORS;  Service: Ophthalmology;  Laterality: Left;  PACK LOT: [id number] H US:00:32 AP:44 CDE:6.46  . COLONOSCOPY    . COLONOSCOPY WITH PROPOFOL N/A 11/26/2015   Procedure: COLONOSCOPY WITH PROPOFOL;  Surgeon: Manya Silvas, MD;  Location: [facility] ENDOSCOPY;  Service: Endoscopy;  Laterality: N/A;  . EYE SURGERY    . TONSILLECTOMY      Medical History: Past Medical History:  Diagnosis Date  . Allergic rhinitis   . Anxiety   . Asthma   . Blood transfusion without reported diagnosis   . Depression   . Diverticulosis   . Dyspnea   . Esophagitis   . GERD (gastroesophageal reflux disease)   . Headache   . Heart murmur   . Hyperlipemia   . Hypertension   . Hypothyroidism   . Obesity   . Osteopenia   . Palpitations   . Pneumothorax   . Sleep apnea     Family History: Family History  Problem Relation Age of Onset  . Breast cancer Maternal  Grandmother   . Stroke Maternal Grandmother   . Bladder Cancer Father   . Prostate cancer Father   . Heart attack Father   . Aortic aneurysm Father   . Congestive Heart Failure Father   . Colon cancer Paternal Grandmother   . Aneurysm Paternal Grandmother   . Stroke Paternal Grandmother   . Congestive Heart Failure Paternal Grandmother   . Dementia Mother   . Stroke Mother   . Dementia Maternal Grandfather   . Stroke Maternal Grandfather   . Dementia Paternal Grandfather   . Stroke Paternal Grandfather    Depression screen [facility] 2/9 10/30/2018 10/30/2018 08/31/2018 05/01/2018 01/30/2018  Decreased Interest 0 0 0 2 0  Down, Depressed, Hopeless 0 0 0 1 0  PHQ - 2 Score 0 0 0 3 0  Altered sleeping - - - 3 -  Tired, decreased energy - - - 3 -  Change in appetite - - - 0 -  Feeling bad  or failure about yourself  - - - 0 -  Trouble concentrating - - - 0 -  Moving slowly or fidgety/restless - - - 0 -  Suicidal thoughts - - - 0 -  PHQ-9 Score - - - 9 -    Functional Status Survey: Is the patient deaf or have difficulty hearing?: Yes Does the patient have difficulty seeing, even when wearing glasses/contacts?: No Does the patient have difficulty concentrating, remembering, or making decisions?: No Does the patient have difficulty walking or climbing stairs?: No Does the patient have difficulty dressing or bathing?: No Does the patient have difficulty doing errands alone such as visiting a doctor's office or shopping?: No  MMSE - [place] Exam 10/30/2018  Orientation to time 5  Orientation to Place 5  Registration 3  Attention/ Calculation 5  Recall 3  Language- name 2 objects 2  Language- repeat 1  Language- follow 3 step command 3  Language- read & follow direction 1  Write a sentence 1  Copy design 1  Total score 30    Fall Risk  10/30/2018 10/30/2018 08/31/2018 05/31/2018 05/01/2018  Falls in the past year? 0 0 No Yes Yes  Number falls in past yr: - - - 1 2 or  more  Injury with Fall? - - - Yes Yes  Comment - - - - bruising      Review of Systems  Constitutional: Negative for chills, fatigue and unexpected weight change.  HENT: Negative for congestion, rhinorrhea, sneezing and sore throat.   Eyes: Negative for photophobia, pain and redness.  Respiratory: Negative for cough, chest tightness and shortness of breath.   Cardiovascular: Negative for chest pain and palpitations.  Gastrointestinal: Negative for abdominal pain, constipation, diarrhea, nausea and vomiting.  Endocrine: Negative.   Genitourinary: Negative for dysuria and frequency.  Musculoskeletal: Negative for arthralgias, back pain, joint swelling and neck pain.  Skin: Negative for rash.  Allergic/Immunologic: Negative.   Neurological: Negative for tremors and numbness.  Hematological: Negative for adenopathy. Does not bruise/bleed easily.  Psychiatric/Behavioral: Negative for behavioral problems and sleep disturbance. The patient is not nervous/anxious.      Vital Signs: BP 110/62 (BP Location: Left Arm, Patient Position: Sitting, Cuff Size: Normal)   Pulse 75   Resp 16   Ht 5\' 1"  (1.549 m)   Wt 138 lb (62.6 kg)   SpO2 96%   BMI 26.07 kg/m    Physical Exam  Constitutional: She is oriented to person, place, and time. She appears well-developed and well-nourished. No distress.  HENT:  Head: Normocephalic and atraumatic.  Mouth/Throat: Oropharynx is clear and moist. No oropharyngeal exudate.  Eyes: Pupils are equal, round, and reactive to light. EOM are normal.  Neck: Normal range of motion. Neck supple. No JVD present. No tracheal deviation present. No thyromegaly present.  Cardiovascular: Normal rate, regular rhythm and normal heart sounds. Exam reveals no gallop and no friction rub.  No murmur heard. Pulmonary/Chest: Effort normal and breath sounds normal. No respiratory distress. She has no wheezes. She has no rales. She exhibits no tenderness.  Abdominal: Soft.  There is no tenderness. There is no guarding.  Musculoskeletal: Normal range of motion.  Lymphadenopathy:    She has no cervical adenopathy.  Neurological: She is alert and oriented to person, place, and time. No cranial nerve deficit.  Skin: Skin is warm and dry. She is not diaphoretic.  Psychiatric: She has a normal mood and affect. Her behavior is normal. Judgment and thought content  normal.  Nursing note and vitals reviewed.    LABS: Recent Results (from the past 2160 hour(s))  POCT Urine Drug Screen     Status: None   Collection Time: 08/31/18 12:34 PM  Result Value Ref Range   POC METHAMPHETAMINE UR None Detected None Detected   POC Opiate Ur None Detected None Detected   POC Barbiturate UR None Detected None Detected   POC Amphetamine UR None Detected None Detected   POC Oxycodone UR None Detected None Detected   POC Cocaine UR None Detected None Detected   POC Ecstasy UR None Detected None Detected   POC TRICYCLICS UR None Detected None Detected   POC PHENCYCLIDINE UR None Detected None Detected   POC MARIJUANA UR None Detected None Detected   POC METHADONE UR None Detected None Detected   POC BENZODIAZEPINES UR None Detected None Detected   URINE TEMPERATURE     POC DRUG SCREEN OXIDANTS URINE     POC SPECIFIC GRAVITY URINE     POC PH URINE     Methylenedioxyamphetamine    Comprehensive metabolic panel     Status: Abnormal   Collection Time: 08/31/18  1:39 PM  Result Value Ref Range   Glucose 85 65 - 99 mg/dL   BUN 20 8 - 27 mg/dL   Creatinine, Ser 0.88 0.57 - 1.00 mg/dL   GFR calc non Af Amer 68 >59 mL/min/1.73   GFR calc Af Amer 78 >59 mL/min/1.73   BUN/Creatinine Ratio 23 12 - 28   Sodium 140 134 - 144 mmol/L   Potassium 4.7 3.5 - 5.2 mmol/L   Chloride 100 96 - 106 mmol/L   CO2 20 20 - 29 mmol/L   Calcium 10.4 (H) 8.7 - 10.3 mg/dL   Total Protein 7.1 6.0 - 8.5 g/dL   Albumin 5.0 (H) 3.6 - 4.8 g/dL   Globulin, Total 2.1 1.5 - 4.5 g/dL   Albumin/Globulin  Ratio 2.4 (H) 1.2 - 2.2   Bilirubin Total 0.4 0.0 - 1.2 mg/dL   Alkaline Phosphatase 49 39 - 117 IU/L   AST 28 0 - 40 IU/L   ALT 17 0 - 32 IU/L  CBC     Status: Abnormal   Collection Time: 08/31/18  1:39 PM  Result Value Ref Range   WBC 7.1 3.4 - 10.8 x10E3/uL   RBC 4.54 3.77 - 5.28 x10E6/uL   Hemoglobin 14.5 11.1 - 15.9 g/dL   Hematocrit 42.6 34.0 - 46.6 %   MCV 94 79 - 97 fL   MCH 31.9 26.6 - 33.0 pg   MCHC 34.0 31.5 - 35.7 g/dL   RDW 11.8 (L) 12.3 - 15.4 %   Platelets 349 150 - 450 x10E3/uL  Lipid Panel w/o Chol/HDL Ratio     Status: Abnormal   Collection Time: 08/31/18  1:39 PM  Result Value Ref Range   Cholesterol, Total 210 (H) 100 - 199 mg/dL   Triglycerides 120 0 - 149 mg/dL   HDL 52 >39 mg/dL   VLDL Cholesterol Cal 24 5 - 40 mg/dL   LDL Calculated 134 (H) 0 - 99 mg/dL  T4, free     Status: None   Collection Time: 08/31/18  1:39 PM  Result Value Ref Range   Free T4 1.76 0.82 - 1.77 ng/dL  TSH     Status: None   Collection Time: 08/31/18  1:39 PM  Result Value Ref Range   TSH 3.500 0.450 - 4.500 uIU/mL  VITAMIN D 25 Hydroxy (Vit-D  Deficiency, Fractures)     Status: None   Collection Time: 08/31/18  1:39 PM  Result Value Ref Range   Vit D, 25-Hydroxy 38.9 30.0 - 100.0 ng/mL    Comment: Vitamin D deficiency has been defined by the [place] practice guideline as a level of serum 25-OH vitamin D less than 20 ng/mL (1,2). The Endocrine Society went on to further define vitamin D insufficiency as a level between 21 and 29 ng/mL (2). 1. IOM (Institute of Medicine). 2010. Dietary reference    intakes for calcium and D. [place]: The    [employer]. 2. Holick MF, Binkley [place], Bischoff-Ferrari HA, et al.    Evaluation, treatment, and prevention of vitamin D    deficiency: an Endocrine Society clinical practice    guideline. JCEM. 2011 Jul; 96(7):1911-30.   T3     Status: Abnormal   Collection Time: 08/31/18  1:39  PM  Result Value Ref Range   T3, Total 69 (L) 71 - 180 ng/dL   Assessment/Plan: 1. Encounter for general adult medical examination with abnormal findings Patient up-to-date on preventative health maintenance.  2. Chronic midline low back pain without sciatica Refill patient's Flexeril increase dose to 1 to 2 tablets twice a day as needed.  Also we have refilled patient's Vicodin as below. - cyclobenzaprine (FLEXERIL) 5 MG tablet; Take 1-2 tablets (5-10 mg total) by mouth 2 (two) times daily as needed for muscle spasms.  Dispense: 120 tablet; Refill: 1 - HYDROcodone-Acetaminophen 5-300 MG TABS; Take 1 tablet by mouth 2 (two) times daily as needed. For severe pain  Dispense: 15 each; Refill: 0 Reviewed risks and possible side effects associated with taking opiates, benzodiazepines and other CNS depressants. Combination of these could cause dizziness and drowsiness. Advised patient not to drive or operate machinery when taking these medications, as patient's and other's life can be at risk and will have consequences. Patient verbalized understanding in this matter. Dependence and abuse for these drugs will be monitored closely. A Controlled substance policy and procedure is on file which allows [facility] to order a urine drug screen test at any visit. Patient understands and agrees with the plan  3. Chronic migraine without aura without status migrainosus, not intractable Patient continues to report intermittent migraines.  She states that her hydrocodone helps in those instances.  4. Hypothyroidism, unspecified type Patient's last laboratory studies in October shows stable hypothyroidism.  Continue current medications as prescribed.  5. GAD (generalized anxiety disorder) Stable, continue current medication therapy.  6. OSA on CPAP Encourage patient to continue using CPAP nightly.  She denies any issues at this time  7. Mixed hyperlipidemia Stable hyperlipidemia.  Continue  current medications.  8. Mild intermittent asthma without complication Stable, patient does not report any exacerbation of asthma as of recent.  She should continue to take medications as prescribed.  9. Essential hypertension, benign Stable, continue current medications.  10. Dysuria - UA/M w/rflx Culture, Routine  General Counseling: Sandy Salaam understanding of the findings of todays visit and agrees with plan of treatment. I have discussed any further diagnostic evaluation that may be needed or ordered today. We also reviewed her medications today. she has been encouraged to call the office with any questions or concerns that should arise related to todays visit.   Orders Placed This Encounter  Procedures  . UA/M w/rflx Culture, Routine    Meds ordered this encounter  Medications  . cyclobenzaprine (FLEXERIL) 5  MG tablet    Sig: Take 1-2 tablets (5-10 mg total) by mouth 2 (two) times daily as needed for muscle spasms.    Dispense:  120 tablet    Refill:  1  . HYDROcodone-Acetaminophen 5-300 MG TABS    Sig: Take 1 tablet by mouth 2 (two) times daily as needed. For severe pain    Dispense:  15 each    Refill:  0    Time spent: 35 Minutes   This patient was seen by Orson Gear AGNP-C in Collaboration with Dr Lavera Guise as a part of collaborative care agreement    Kendell Bane AGNP-C Internal Medicine

## 2018-10-30 NOTE — Patient Instructions (Signed)

## 2018-11-01 ENCOUNTER — Other Ambulatory Visit: Payer: Self-pay | Admitting: Internal Medicine

## 2018-11-02 ENCOUNTER — Other Ambulatory Visit: Payer: Self-pay | Admitting: Internal Medicine

## 2018-11-05 ENCOUNTER — Other Ambulatory Visit: Payer: Self-pay | Admitting: Adult Health

## 2018-11-05 LAB — UA/M W/RFLX CULTURE, ROUTINE
BILIRUBIN UA: NEGATIVE
GLUCOSE, UA: NEGATIVE
KETONES UA: NEGATIVE
Nitrite, UA: NEGATIVE
Protein, UA: NEGATIVE
RBC UA: NEGATIVE
Specific Gravity, UA: 1.018 (ref 1.005–1.030)
Urobilinogen, Ur: 0.2 mg/dL (ref 0.2–1.0)
pH, UA: 6.5 (ref 5.0–7.5)

## 2018-11-05 LAB — URINE CULTURE, REFLEX

## 2018-11-05 LAB — MICROSCOPIC EXAMINATION: Casts: NONE SEEN /lpf

## 2018-11-05 MED ORDER — CIPROFLOXACIN HCL 500 MG PO TABS: 500.0000 mg | ORAL_TABLET | Freq: Two times a day (BID) | ORAL | 0 refills | Status: AC

## 2018-11-05 NOTE — Progress Notes (Signed)
Urine culture  Shows bacteria.  Cipro sent to pharmacy for patient.

## 2018-11-06 ENCOUNTER — Telehealth: Payer: Self-pay | Admitting: Adult Health

## 2018-11-06 NOTE — Telephone Encounter (Signed)
Called patient in regards to her urine culture, left message for patient to return call, want to inform patient of UTI and Rx was sent to pharmacy.

## 2018-11-07 NOTE — Progress Notes (Signed)
GYNECOLOGY  VISIT   HPI: 67 y.o.   Married White or Caucasian Not Hispanic or Latino  female   G1P1001 with No LMP recorded. Patient is postmenopausal.   here for 2 month pessary check. She has a grade 2 uterine prolapse and a mild cystocele. Symptoms are well controlled with a pessary. Using her estrogen cream 2 x week. No vaginal bleeding or vaginal discharge.   GYNECOLOGIC HISTORY: No LMP recorded. Patient is postmenopausal. Contraception: Postmenopausal Menopausal hormone therapy: Estrace cream        OB History    Gravida  1   Para  1   Term  1   Preterm      AB      Living  1     SAB      TAB      Ectopic      Multiple      Live Births  1              Patient Active Problem List   Diagnosis Date Noted  . GAD (generalized anxiety disorder) 09/01/2018  . Chronic migraine without aura without status migrainosus, not intractable 09/01/2018  . Encounter for long-term (current) use of medications 09/01/2018  . Asthma without status asthmaticus 01/30/2018  . Hyperlipidemia, unspecified 01/30/2018  . Hypertension 01/30/2018  . Obesity, unspecified 01/30/2018  . Hypothyroidism 01/30/2018  . Acute respiratory failure with hypoxia ([place]) 12/31/2017  . Fatty infiltration of liver 12/10/2015  . Abdominal pain, LLQ (left lower quadrant) 11/03/2015  . History of adenomatous polyp of colon 11/03/2015  . Hallux valgus, left 09/18/2014  . 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  . Shoulder arthritis 12/19/2013    Past Medical History:  Diagnosis Date  . Allergic rhinitis   . Anxiety   . Asthma   . Blood transfusion without reported diagnosis   . Depression   . Diverticulosis   . Dyspnea   . Esophagitis   . GERD (gastroesophageal reflux disease)   . Headache   . Heart murmur   . Hyperlipemia   . Hypertension   . Hypothyroidism   . Obesity   . Osteopenia   . Palpitations   . Pneumothorax   . Sleep apnea     Past Surgical History:  Procedure  Laterality Date  . BUNIONECTOMY    . CATARACT EXTRACTION W/PHACO Right 10/11/2016   Procedure: CATARACT EXTRACTION PHACO AND INTRAOCULAR LENS PLACEMENT (IOC);  Surgeon: Birder Robson, MD;  Location: [facility] ORS;  Service: Ophthalmology;  Laterality: Right;  Lot# [id number] H [place]: 00:37.7 AP%: 18.1 CDE: 6.80  . CATARACT EXTRACTION W/PHACO Left 11/08/2016   Procedure: CATARACT EXTRACTION PHACO AND INTRAOCULAR LENS PLACEMENT (IOC);  Surgeon: Birder Robson, MD;  Location: [facility] ORS;  Service: Ophthalmology;  Laterality: Left;  PACK LOT: [id number] H US:00:32 AP:44 CDE:6.46  . COLONOSCOPY    . COLONOSCOPY WITH PROPOFOL N/A 11/26/2015   Procedure: COLONOSCOPY WITH PROPOFOL;  Surgeon: Manya Silvas, MD;  Location: [facility] ENDOSCOPY;  Service: Endoscopy;  Laterality: N/A;  . EYE SURGERY    . TONSILLECTOMY      Current Outpatient Medications  Medication Sig Dispense Refill  . acetaminophen (TYLENOL) 500 MG tablet Take 1,000 mg by mouth 2 (two) times daily as needed for mild pain or moderate pain.    Marland Kitchen albuterol (PROVENTIL HFA;VENTOLIN HFA) 108 (90 Base) MCG/ACT inhaler INHALE 2 PUFFS BY MOUTH EVERY 6 HOURS 18 Inhaler 1  . ALPRAZolam (XANAX) 0.25 MG tablet Take 1 tablet (0.25 mg total) by mouth  2 (two) times daily as needed for anxiety. 60 tablet 2  . amLODipine (NORVASC) 5 MG tablet Take 1 tablet (5 mg total) by mouth daily. 90 tablet 3  . bisacodyl (DULCOLAX) 5 MG EC tablet Take 5-10 mg by mouth at bedtime as needed for moderate constipation. Two at night    . cetirizine (ZYRTEC) 10 MG tablet Take 10 mg by mouth daily.    . cholecalciferol (VITAMIN D) 1000 units tablet Take 1,000 Units by mouth at bedtime.    . ciprofloxacin (CIPRO) 500 MG tablet Take 1 tablet (500 mg total) by mouth 2 (two) times daily for 7 days. 14 tablet 0  . clindamycin (CLEOCIN T) 1 % external solution Apply 1 application topically 2 (two) times daily as needed. For skin breakdown 30 mL 2  . cyclobenzaprine (FLEXERIL) 5 MG tablet  Take 1-2 tablets (5-10 mg total) by mouth 2 (two) times daily as needed for muscle spasms. 120 tablet 1  . diclofenac sodium (VOLTAREN) 1 % GEL APPLY 2 GRAMS TO AFFECTED AREA 4 TIMES A DAY  11  . docusate sodium (COLACE) 100 MG capsule Take 100-200 mg by mouth at bedtime as needed for mild constipation or moderate constipation.     Marland Kitchen estradiol (ESTRACE) 0.1 MG/GM vaginal cream 1 gram vaginally twice weekly at bedtime 42.5 g 1  . fenofibrate 160 MG tablet TAKE 1 TABLET BY MOUTH EVERY DAY 90 tablet 1  . FLOVENT HFA 110 MCG/ACT inhaler TAKE 1 PUFF BY MOUTH TWICE A DAY 12 Inhaler 3  . FLUoxetine (PROZAC) 40 MG capsule TAKE 1 CAPSULE BY MOUTH EVERY DAY 90 capsule 1  . HYDROcodone-Acetaminophen 5-300 MG TABS Take 1 tablet by mouth 2 (two) times daily as needed. For severe pain 15 each 0  . ipratropium (ATROVENT HFA) 17 MCG/ACT inhaler Inhale 2 puffs into the lungs 2 (two) times daily.     Marland Kitchen ipratropium (ATROVENT) 0.02 % nebulizer solution Inhale into the lungs.    . isometheptene-acetaminophen-dichloralphenazone (MIDRIN) 65-100-325 MG capsule Take 1 capsule by mouth 4 (four) times daily as needed for migraine. Maximum 5 capsules in 12 hours for migraine headaches, 8 capsules in 24 hours for tension headaches.    . levothyroxine (SYNTHROID, LEVOTHROID) 100 MCG tablet Take 1 tablet (100 mcg total) by mouth every morning. 90 tablet 3  . lisinopril (PRINIVIL,ZESTRIL) 10 MG tablet TAKE 1 TABLET BY MOUTH EVERY DAY 90 tablet 1  . magnesium oxide (MAG-OX) 400 MG tablet Take 400 mg by mouth at bedtime.     . montelukast (SINGULAIR) 10 MG tablet Take 1 tablet (10 mg total) by mouth daily. 30 tablet 5  . omeprazole (PRILOSEC) 40 MG capsule TAKE ONE CAPSULE BY MOUTH DAILY 90 capsule 3  . triamcinolone cream (KENALOG) 0.1 % Apply 1 application topically 2 (two) times daily as needed. For skin breakdown    . vitamin B-12 (CYANOCOBALAMIN) 1000 MCG tablet Take 1,000 mcg by mouth daily.     No current  facility-administered medications for this visit.      ALLERGIES: Advair diskus [fluticasone-salmeterol]; Codeine; Erythromycin; Morphine and related; Nsaids; and Sulfa antibiotics  Family History  Problem Relation Age of Onset  . Breast cancer Maternal Grandmother   . Stroke Maternal Grandmother   . Bladder Cancer Father   . Prostate cancer Father   . Heart attack Father   . Aortic aneurysm Father   . Congestive Heart Failure Father   . Colon cancer Paternal Grandmother   . Aneurysm Paternal Grandmother   .  Stroke Paternal Grandmother   . Congestive Heart Failure Paternal Grandmother   . Dementia Mother   . Stroke Mother   . Dementia Maternal Grandfather   . Stroke Maternal Grandfather   . Dementia Paternal Grandfather   . Stroke Paternal Grandfather     Social History   Socioeconomic History  . Marital status: Married    Spouse name: Not on file  . Number of children: Not on file  . Years of education: Not on file  . Highest education level: Not on file  Occupational History  . Not on file  Social Needs  . Financial resource strain: Not on file  . Food insecurity:    Worry: Not on file    Inability: Not on file  . Transportation needs:    Medical: Not on file    Non-medical: Not on file  Tobacco Use  . Smoking status: Former Research scientist (life sciences)  . Smokeless tobacco: Never Used  Substance and Sexual Activity  . Alcohol use: No  . Drug use: No  . Sexual activity: Not Currently    Partners: Male    Birth control/protection: Post-menopausal  Lifestyle  . Physical activity:    Days per week: Not on file    Minutes per session: Not on file  . Stress: Not on file  Relationships  . Social connections:    Talks on phone: Not on file    Gets together: Not on file    Attends religious service: Not on file    Active member of club or organization: Not on file    Attends meetings of clubs or organizations: Not on file    Relationship status: Not on file  . Intimate partner  violence:    Fear of current or ex partner: Not on file    Emotionally abused: Not on file    Physically abused: Not on file    Forced sexual activity: Not on file  Other Topics Concern  . Not on file  Social History Narrative  . Not on file    Review of Systems  Constitutional: Negative.   HENT: Negative.   Eyes: Negative.   Respiratory: Negative.   Cardiovascular: Negative.   Gastrointestinal: Negative.   Genitourinary: Negative.   Musculoskeletal: Negative.   Skin: Negative.   Neurological: Negative.   Endo/Heme/Allergies: Negative.   Psychiatric/Behavioral: Negative.     PHYSICAL EXAMINATION:    BP 128/88 (BP Location: Right Arm, Patient Position: Sitting, Cuff Size: Normal)   Pulse 72   Wt 137 lb (62.1 kg)   BMI 25.89 kg/m     General appearance: alert, cooperative and appears stated age  Pelvic: External genitalia:  no lesions              Urethra:  normal appearing urethra with no masses, tenderness or lesions              Bartholins and Skenes: normal                 Vagina: the pessary was removed and cleaned. There is an approximately 1 cm area of very mild erosion of the vagina at the apex on the right. 1 gram of estrace cream placed, pessary replaced.               Cervix:  no lesions  Chaperone was present for exam.  ASSESSMENT Pessary is treating her prolapse well, slight vaginal irritation    PLAN Estrace cream placed, ring replaced Increase estrogen    An  After Visit Summary was printed and given to the patient.

## 2018-11-08 ENCOUNTER — Other Ambulatory Visit: Payer: Self-pay

## 2018-11-08 ENCOUNTER — Ambulatory Visit (INDEPENDENT_AMBULATORY_CARE_PROVIDER_SITE_OTHER): Payer: Medicare Other | Admitting: Obstetrics and Gynecology

## 2018-11-08 ENCOUNTER — Encounter: Payer: Self-pay | Admitting: Obstetrics and Gynecology

## 2018-11-08 ENCOUNTER — Other Ambulatory Visit: Payer: Self-pay | Admitting: Internal Medicine

## 2018-11-08 VITALS — BP 128/88 | HR 72 | Wt 137.0 lb

## 2018-11-08 DIAGNOSIS — N898 Other specified noninflammatory disorders of vagina: Secondary | ICD-10-CM

## 2018-11-08 DIAGNOSIS — N899 Noninflammatory disorder of vagina, unspecified: Secondary | ICD-10-CM | POA: Diagnosis not present

## 2018-11-08 DIAGNOSIS — Z4689 Encounter for fitting and adjustment of other specified devices: Secondary | ICD-10-CM

## 2018-11-08 MED ORDER — MONTELUKAST SODIUM 10 MG PO TABS: 10.0000 mg | ORAL_TABLET | Freq: Every day | ORAL | 5 refills | Status: DC

## 2018-11-08 NOTE — Patient Instructions (Signed)
Increase your estrace cream to 1 gram 3 x a week

## 2018-11-19 ENCOUNTER — Telehealth: Payer: Self-pay | Admitting: Obstetrics and Gynecology

## 2018-11-19 NOTE — Telephone Encounter (Signed)
Call returned to patient, phone rang and rang, no answer. No voicemail.    Current Rx for estradiol vaginal cream on file with 1 RF at [facility]

## 2018-11-19 NOTE — Telephone Encounter (Signed)
Patient is in need of a refill on estradiol cream. States Dr. Talbert Nan wanted her to use it 3x a week instead of 2 and she has misplaced her current prescription. She was unable to use it last night due to it being lost.

## 2018-11-22 NOTE — Telephone Encounter (Signed)
Spoke with patient. Advised as seen below, patient will f/u with pharmacy for filling. Will see Dr. Talbert Nan on 1/23 for f/u.   Routing to provider for final review. Patient is agreeable to disposition. Will close encounter.

## 2018-11-22 NOTE — Telephone Encounter (Signed)
Spoke with patient. Patient states she has lost her tube of estradiol vaginal cream, requesting refill. Advised patient she has 1 RF on file. Patient states pharmacy did not offer refill of med when she called. Advised I would place call to [facility] and return call.   Call to [facility], was advised 1 refill on file for Estrace vaginal cream, ok to refill. Out of pocket cost $169.99.

## 2018-11-27 ENCOUNTER — Ambulatory Visit: Payer: Self-pay | Admitting: Nurse Practitioner

## 2018-12-03 ENCOUNTER — Encounter: Payer: Self-pay | Admitting: Internal Medicine

## 2018-12-03 ENCOUNTER — Ambulatory Visit (INDEPENDENT_AMBULATORY_CARE_PROVIDER_SITE_OTHER): Payer: Medicare Other | Admitting: Internal Medicine

## 2018-12-03 ENCOUNTER — Ambulatory Visit
Admission: RE | Admit: 2018-12-03 | Discharge: 2018-12-03 | Disposition: A | Discharge status: Home / Self Care | Payer: Medicare Other | Attending: Internal Medicine | Admitting: Internal Medicine

## 2018-12-03 ENCOUNTER — Ambulatory Visit
Admission: RE | Admit: 2018-12-03 | Discharge: 2018-12-03 | Disposition: A | Discharge status: Home / Self Care | Payer: Medicare Other | Source: Ambulatory Visit | Attending: Internal Medicine | Admitting: Internal Medicine

## 2018-12-03 VITALS — BP 146/100 | HR 89 | Temp 98.2°F | Resp 16 | Ht 61.0 in | Wt 138.0 lb

## 2018-12-03 DIAGNOSIS — R0602 Shortness of breath: Secondary | ICD-10-CM | POA: Diagnosis not present

## 2018-12-03 DIAGNOSIS — R05 Cough: Secondary | ICD-10-CM | POA: Diagnosis not present

## 2018-12-03 DIAGNOSIS — G4733 Obstructive sleep apnea (adult) (pediatric): Secondary | ICD-10-CM | POA: Diagnosis not present

## 2018-12-03 DIAGNOSIS — J301 Allergic rhinitis due to pollen: Secondary | ICD-10-CM | POA: Diagnosis not present

## 2018-12-03 DIAGNOSIS — J4541 Moderate persistent asthma with (acute) exacerbation: Secondary | ICD-10-CM | POA: Insufficient documentation

## 2018-12-03 DIAGNOSIS — G43809 Other migraine, not intractable, without status migrainosus: Secondary | ICD-10-CM

## 2018-12-03 MED ORDER — PREDNISONE 10 MG (21) PO TBPK: ORAL_TABLET | ORAL | 0 refills | Status: DC

## 2018-12-03 MED ORDER — LEVOFLOXACIN 500 MG PO TABS: 500.0000 mg | ORAL_TABLET | Freq: Every day | ORAL | 0 refills | Status: DC

## 2018-12-03 NOTE — Patient Instructions (Signed)

## 2018-12-03 NOTE — Progress Notes (Signed)
[facility] [place], [place] [address]  Pulmonary Sleep Medicine   Office Visit Note  Patient Name: Bonnie West DOB: 1951-09-29 MRN [id number]  Date of Service: 12/03/2018  Complaints/HPI: Acute Cough bronchitis.  Patient has been noting increasing cough and shortness of breath.  She is bringing up sputum she states the sputum is thick in consistency green in color.  She denies having any hemoptysis.  She has been sick for a few days.  She was concerned that she has to serve jury duty so therefore will not be able to do so because of the cough and congestion.  I think it is reasonable to have her excused from jury duty for these reasons.  ROS  General: (-) fever, (-) chills, (-) night sweats, (-) weakness Skin: (-) rashes, (-) itching,. Eyes: (-) visual changes, (-) redness, (-) itching. Nose and Sinuses: (-) nasal stuffiness or itchiness, (-) postnasal drip, (-) nosebleeds, (-) sinus trouble. Mouth and Throat: (-) sore throat, (-) hoarseness. Neck: (-) swollen glands, (-) enlarged thyroid, (-) neck pain. Respiratory: + cough, (-) bloody sputum, + shortness of breath, + wheezing. Cardiovascular: - ankle swelling, (-) chest pain. Lymphatic: (-) lymph node enlargement. Neurologic: (-) numbness, (-) tingling. Psychiatric: (-) anxiety, (-) depression   Current Medication: Outpatient Encounter Medications as of 12/03/2018  Medication Sig  . acetaminophen (TYLENOL) 500 MG tablet Take 1,000 mg by mouth 2 (two) times daily as needed for mild pain or moderate pain.  Marland Kitchen albuterol (PROVENTIL HFA;VENTOLIN HFA) 108 (90 Base) MCG/ACT inhaler INHALE 2 PUFFS BY MOUTH EVERY 6 HOURS  . ALPRAZolam (XANAX) 0.25 MG tablet Take 1 tablet (0.25 mg total) by mouth 2 (two) times daily as needed for anxiety.  Marland Kitchen amLODipine (NORVASC) 5 MG tablet Take 1 tablet (5 mg total) by mouth daily.  . bisacodyl (DULCOLAX) 5 MG EC tablet Take 5-10 mg by mouth at bedtime as needed for moderate  constipation. Two at night  . cetirizine (ZYRTEC) 10 MG tablet Take 10 mg by mouth daily.  . cholecalciferol (VITAMIN D) 1000 units tablet Take 1,000 Units by mouth at bedtime.  . clindamycin (CLEOCIN T) 1 % external solution Apply 1 application topically 2 (two) times daily as needed. For skin breakdown  . cyclobenzaprine (FLEXERIL) 5 MG tablet Take 1-2 tablets (5-10 mg total) by mouth 2 (two) times daily as needed for muscle spasms.  . diclofenac sodium (VOLTAREN) 1 % GEL APPLY 2 GRAMS TO AFFECTED AREA 4 TIMES A DAY  . docusate sodium (COLACE) 100 MG capsule Take 100-200 mg by mouth at bedtime as needed for mild constipation or moderate constipation.   Marland Kitchen estradiol (ESTRACE) 0.1 MG/GM vaginal cream 1 gram vaginally twice weekly at bedtime  . fenofibrate 160 MG tablet TAKE 1 TABLET BY MOUTH EVERY DAY  . FLOVENT HFA 110 MCG/ACT inhaler TAKE 1 PUFF BY MOUTH TWICE A DAY  . FLUoxetine (PROZAC) 40 MG capsule TAKE 1 CAPSULE BY MOUTH EVERY DAY  . HYDROcodone-Acetaminophen 5-300 MG TABS Take 1 tablet by mouth 2 (two) times daily as needed. For severe pain  . ipratropium (ATROVENT HFA) 17 MCG/ACT inhaler Inhale 2 puffs into the lungs 2 (two) times daily.   Marland Kitchen ipratropium (ATROVENT) 0.02 % nebulizer solution Inhale into the lungs.  . isometheptene-acetaminophen-dichloralphenazone (MIDRIN) 65-100-325 MG capsule Take 1 capsule by mouth 4 (four) times daily as needed for migraine. Maximum 5 capsules in 12 hours for migraine headaches, 8 capsules in 24 hours for tension headaches.  Marland Kitchen  levothyroxine (SYNTHROID, LEVOTHROID) 100 MCG tablet Take 1 tablet (100 mcg total) by mouth every morning.  Marland Kitchen lisinopril (PRINIVIL,ZESTRIL) 10 MG tablet TAKE 1 TABLET BY MOUTH EVERY DAY  . magnesium oxide (MAG-OX) 400 MG tablet Take 400 mg by mouth at bedtime.   . montelukast (SINGULAIR) 10 MG tablet Take 1 tablet (10 mg total) by mouth daily.  Marland Kitchen omeprazole (PRILOSEC) 40 MG capsule TAKE ONE CAPSULE BY MOUTH DAILY  . triamcinolone  cream (KENALOG) 0.1 % Apply 1 application topically 2 (two) times daily as needed. For skin breakdown  . vitamin B-12 (CYANOCOBALAMIN) 1000 MCG tablet Take 1,000 mcg by mouth daily.   No facility-administered encounter medications on file as of 12/03/2018.     Surgical History: Past Surgical History:  Procedure Laterality Date  . BUNIONECTOMY    . CATARACT EXTRACTION W/PHACO Right 10/11/2016   Procedure: CATARACT EXTRACTION PHACO AND INTRAOCULAR LENS PLACEMENT (IOC);  Surgeon: Birder Robson, MD;  Location: [facility] ORS;  Service: Ophthalmology;  Laterality: Right;  Lot# [id number] H [place]: 00:37.7 AP%: 18.1 CDE: 6.80  . CATARACT EXTRACTION W/PHACO Left 11/08/2016   Procedure: CATARACT EXTRACTION PHACO AND INTRAOCULAR LENS PLACEMENT (IOC);  Surgeon: Birder Robson, MD;  Location: [facility] ORS;  Service: Ophthalmology;  Laterality: Left;  PACK LOT: [id number] H US:00:32 AP:44 CDE:6.46  . COLONOSCOPY    . COLONOSCOPY WITH PROPOFOL N/A 11/26/2015   Procedure: COLONOSCOPY WITH PROPOFOL;  Surgeon: Manya Silvas, MD;  Location: [facility] ENDOSCOPY;  Service: Endoscopy;  Laterality: N/A;  . EYE SURGERY    . TONSILLECTOMY      Medical History: Past Medical History:  Diagnosis Date  . Allergic rhinitis   . Anxiety   . Asthma   . Blood transfusion without reported diagnosis   . Depression   . Diverticulosis   . Dyspnea   . Esophagitis   . GERD (gastroesophageal reflux disease)   . Headache   . Heart murmur   . Hyperlipemia   . Hypertension   . Hypothyroidism   . Obesity   . Osteopenia   . Palpitations   . Pneumothorax   . Sleep apnea     Family History: Family History  Problem Relation Age of Onset  . Breast cancer Maternal Grandmother   . Stroke Maternal Grandmother   . Bladder Cancer Father   . Prostate cancer Father   . Heart attack Father   . Aortic aneurysm Father   . Congestive Heart Failure Father   . Colon cancer Paternal Grandmother   . Aneurysm Paternal Grandmother   .  Stroke Paternal Grandmother   . Congestive Heart Failure Paternal Grandmother   . Dementia Mother   . Stroke Mother   . Dementia Maternal Grandfather   . Stroke Maternal Grandfather   . Dementia Paternal Grandfather   . Stroke Paternal Grandfather     Social History: Social History   Socioeconomic History  . Marital status: Married    Spouse name: Not on file  . Number of children: Not on file  . Years of education: Not on file  . Highest education level: Not on file  Occupational History  . Not on file  Social Needs  . Financial resource strain: Not on file  . Food insecurity:    Worry: Not on file    Inability: Not on file  . Transportation needs:    Medical: Not on file    Non-medical: Not on file  Tobacco Use  . Smoking status: Former Research scientist (life sciences)  . Smokeless tobacco: Never Used  Substance and Sexual Activity  . Alcohol use: No  . Drug use: No  . Sexual activity: Not Currently    Partners: Male    Birth control/protection: Post-menopausal  Lifestyle  . Physical activity:    Days per week: Not on file    Minutes per session: Not on file  . Stress: Not on file  Relationships  . Social connections:    Talks on phone: Not on file    Gets together: Not on file    Attends religious service: Not on file    Active member of club or organization: Not on file    Attends meetings of clubs or organizations: Not on file    Relationship status: Not on file  . Intimate partner violence:    Fear of current or ex partner: Not on file    Emotionally abused: Not on file    Physically abused: Not on file    Forced sexual activity: Not on file  Other Topics Concern  . Not on file  Social History Narrative  . Not on file    Vital Signs: Blood pressure (!) 146/100, pulse 89, temperature 98.2 F (36.8 C), temperature source Oral, resp. rate 16, height 5\' 1"  (1.549 m), weight 138 lb (62.6 kg), SpO2 97 %.  Examination: General Appearance: The patient is well-developed,  well-nourished, and in no distress. Skin: Gross inspection of skin unremarkable. Head: normocephalic, no gross deformities. Eyes: no gross deformities noted. ENT: ears appear grossly normal no exudates. Neck: Supple. No thyromegaly. No LAD. Respiratory: scattered rhonchi noted. Cardiovascular: Normal S1 and S2 without murmur or rub. Extremities: No cyanosis. pulses are equal. Neurologic: Alert and oriented. No involuntary movements.  LABS: Recent Results (from the past 2160 hour(s))  UA/M w/rflx Culture, Routine     Status: Abnormal   Collection Time: 10/30/18 11:01 AM  Result Value Ref Range   Specific Gravity, UA 1.018 1.005 - 1.030   pH, UA 6.5 5.0 - 7.5   Color, UA Yellow Yellow   Appearance Ur Clear Clear   Leukocytes, UA 2+ (A) Negative   Protein, UA Negative Negative/Trace   Glucose, UA Negative Negative   Ketones, UA Negative Negative   RBC, UA Negative Negative   Bilirubin, UA Negative Negative   Urobilinogen, Ur 0.2 0.2 - 1.0 mg/dL   Nitrite, UA Negative Negative   Microscopic Examination See below:     Comment: Microscopic was indicated and was performed.   Urinalysis Reflex Comment     Comment: This specimen has reflexed to a Urine Culture.  Microscopic Examination     Status: None   Collection Time: 10/30/18 11:01 AM  Result Value Ref Range   WBC, UA 0-5 0 - 5 /hpf   RBC, UA 0-2 0 - 2 /hpf   Epithelial Cells (non renal) 0-10 0 - 10 /hpf   Casts None seen None seen /lpf   Mucus, UA Present Not Estab.   Bacteria, UA Few None seen/Few  Urine Culture, Reflex     Status: Abnormal   Collection Time: 10/30/18 11:01 AM  Result Value Ref Range   Urine Culture, Routine Final report (A)    Organism ID, Bacteria Enterococcus faecalis (A)     Comment: 10,000-25,000 colony forming units per mL   Antimicrobial Susceptibility Comment     Comment:       ** S = Susceptible; I = Intermediate; R = Resistant **  P = Positive; N = Negative             MICS  are expressed in micrograms per mL    Antibiotic                 RSLT#1    RSLT#2    RSLT#3    RSLT#4 Ciprofloxacin                  S Levofloxacin                   S Nitrofurantoin                 S Penicillin                     S Tetracycline                   R Vancomycin                     S     Radiology: Mm 3d Screen Breast Bilateral  Result Date: 07/26/2018 CLINICAL DATA:  Screening. EXAM: DIGITAL SCREENING BILATERAL MAMMOGRAM WITH TOMO AND CAD COMPARISON:  Previous exam(s). ACR Breast Density Category b: There are scattered areas of fibroglandular density. FINDINGS: There are no findings suspicious for malignancy. Images were processed with CAD. IMPRESSION: No mammographic evidence of malignancy. A result letter of this screening mammogram will be mailed directly to the patient. RECOMMENDATION: Screening mammogram in one year. (Code:SM-B-01Y) BI-RADS CATEGORY  1: Negative. Electronically Signed   By: Fidela Salisbury M.D.   On: 07/26/2018 17:22    No results found.  No results found.    Assessment and Plan: Patient Active Problem List   Diagnosis Date Noted  . GAD (generalized anxiety disorder) 09/01/2018  . Chronic migraine without aura without status migrainosus, not intractable 09/01/2018  . Encounter for long-term (current) use of medications 09/01/2018  . Asthma without status asthmaticus 01/30/2018  . Hyperlipidemia, unspecified 01/30/2018  . Hypertension 01/30/2018  . Obesity, unspecified 01/30/2018  . Hypothyroidism 01/30/2018  . Acute respiratory failure with hypoxia ([place]) 12/31/2017  . Fatty infiltration of liver 12/10/2015  . Abdominal pain, LLQ (left lower quadrant) 11/03/2015  . History of adenomatous polyp of colon 11/03/2015  . Hallux valgus, left 09/18/2014  . 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  . Shoulder arthritis 12/19/2013    1. Acute asthma likely has an acute bronchitis at this time which might be flaring up her acute asthma  symptoms.  Patient was given a prescription for Levaquin today also gave her steroids 2. Chronic Migraines controlled at this time we will continue with supportive care 3. Allergi Rhinitis she is to consider continuing with the current medications 4. OSA not using she has not been able to tolerate the CPAP and has refused to use it.  General Counseling: I have discussed the findings of the evaluation and examination with Vaughan Basta.  I have also discussed any further diagnostic evaluation thatmay be needed or ordered today. Geneen verbalizes understanding of the findings of todays visit. We also reviewed her medications today and discussed drug interactions and side effects including but not limited excessive drowsiness and altered mental states. We also discussed that there is always a risk not just to her but also people around her. she has been encouraged to call the office with any questions or concerns that should arise related to todays visit.  Orders Placed This Encounter  Procedures  . DG Chest 2 View    Standing Status:   Future    Number of Occurrences:   1    Standing Expiration Date:   02/01/2020    Order Specific Question:   Reason for Exam (SYMPTOM  OR DIAGNOSIS REQUIRED)    Answer:   cough    Order Specific Question:   Preferred imaging location?    Answer:   [facility]    Order Specific Question:   Radiology Contrast Protocol - do NOT remove file path    Answer:   [email or web address]  . Pulmonary function test    Standing Status:   Future    Standing Expiration Date:   12/04/2019    Order Specific Question:   Where should this test be performed?    Answer:   [facility]   .   Time spent: 61min  I have personally obtained a history, examined the patient, evaluated laboratory and imaging results, formulated the assessment and plan and placed orders.    Allyne Gee, MD [facility] Pulmonary and Critical Care Sleep medicine

## 2018-12-06 ENCOUNTER — Ambulatory Visit: Payer: Self-pay | Admitting: Internal Medicine

## 2018-12-11 NOTE — Progress Notes (Signed)
GYNECOLOGY  VISIT   HPI: 68 y.o.   Married White or Caucasian Not Hispanic or Latino  female   G1P1001 with No LMP recorded. Patient is postmenopausal.   here for pessary recheck.  She has a grade 2 uterine prolapse and mild cystocele, overall well controlled with the pessary. She had a respiratory infection last week and was coughing a lot, it was pushing the pessary to the opening of her vagina. She has also lost weight and is wondering if she needs a different size pessary.  She isn't bleeding, voiding fine. Recently with constipation, typically no issues with BM.  At her visit last month she had a 1 cm area of mild erosion in the right vaginal apex, her use of estrogen cream was increased from 2 to 3 x a week.  She is going on a Cruise on 12/30/18.  GYNECOLOGIC HISTORY: No LMP recorded. Patient is postmenopausal. Contraception: Postmenopausal Menopausal hormone therapy: Estrace vaginal cream        OB History    Gravida  1   Para  1   Term  1   Preterm      AB      Living  1     SAB      TAB      Ectopic      Multiple      Live Births  1              Patient Active Problem List   Diagnosis Date Noted  . GAD (generalized anxiety disorder) 09/01/2018  . Chronic migraine without aura without status migrainosus, not intractable 09/01/2018  . Encounter for long-term (current) use of medications 09/01/2018  . Asthma without status asthmaticus 01/30/2018  . Hyperlipidemia, unspecified 01/30/2018  . Hypertension 01/30/2018  . Obesity, unspecified 01/30/2018  . Hypothyroidism 01/30/2018  . Acute respiratory failure with hypoxia ([place]) 12/31/2017  . Fatty infiltration of liver 12/10/2015  . Abdominal pain, LLQ (left lower quadrant) 11/03/2015  . History of adenomatous polyp of colon 11/03/2015  . Hallux valgus, left 09/18/2014  . 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  . Shoulder arthritis 12/19/2013    Past Medical History:  Diagnosis Date  . Allergic  rhinitis   . Anxiety   . Asthma   . Blood transfusion without reported diagnosis   . Depression   . Diverticulosis   . Dyspnea   . Esophagitis   . GERD (gastroesophageal reflux disease)   . Headache   . Heart murmur   . Hyperlipemia   . Hypertension   . Hypothyroidism   . Obesity   . Osteopenia   . Palpitations   . Pneumothorax   . Sleep apnea     Past Surgical History:  Procedure Laterality Date  . BUNIONECTOMY    . CATARACT EXTRACTION W/PHACO Right 10/11/2016   Procedure: CATARACT EXTRACTION PHACO AND INTRAOCULAR LENS PLACEMENT (IOC);  Surgeon: Birder Robson, MD;  Location: [facility] ORS;  Service: Ophthalmology;  Laterality: Right;  Lot# [id number] H [place]: 00:37.7 AP%: 18.1 CDE: 6.80  . CATARACT EXTRACTION W/PHACO Left 11/08/2016   Procedure: CATARACT EXTRACTION PHACO AND INTRAOCULAR LENS PLACEMENT (IOC);  Surgeon: Birder Robson, MD;  Location: [facility] ORS;  Service: Ophthalmology;  Laterality: Left;  PACK LOT: [id number] H US:00:32 AP:44 CDE:6.46  . COLONOSCOPY    . COLONOSCOPY WITH PROPOFOL N/A 11/26/2015   Procedure: COLONOSCOPY WITH PROPOFOL;  Surgeon: Manya Silvas, MD;  Location: [facility] ENDOSCOPY;  Service: Endoscopy;  Laterality: N/A;  . EYE SURGERY    .  TONSILLECTOMY      Current Outpatient Medications  Medication Sig Dispense Refill  . acetaminophen (TYLENOL) 500 MG tablet Take 1,000 mg by mouth 2 (two) times daily as needed for mild pain or moderate pain.    Marland Kitchen albuterol (PROVENTIL HFA;VENTOLIN HFA) 108 (90 Base) MCG/ACT inhaler INHALE 2 PUFFS BY MOUTH EVERY 6 HOURS 18 Inhaler 1  . ALPRAZolam (XANAX) 0.25 MG tablet Take 1 tablet (0.25 mg total) by mouth 2 (two) times daily as needed for anxiety. 60 tablet 2  . amLODipine (NORVASC) 5 MG tablet Take 1 tablet (5 mg total) by mouth daily. 90 tablet 3  . bisacodyl (DULCOLAX) 5 MG EC tablet Take 5-10 mg by mouth at bedtime as needed for moderate constipation. Two at night    . cetirizine (ZYRTEC) 10 MG tablet Take 10 mg by  mouth daily.    . cholecalciferol (VITAMIN D) 1000 units tablet Take 1,000 Units by mouth at bedtime.    . clindamycin (CLEOCIN T) 1 % external solution Apply 1 application topically 2 (two) times daily as needed. For skin breakdown 30 mL 2  . cyclobenzaprine (FLEXERIL) 5 MG tablet Take 1-2 tablets (5-10 mg total) by mouth 2 (two) times daily as needed for muscle spasms. 120 tablet 1  . diclofenac sodium (VOLTAREN) 1 % GEL APPLY 2 GRAMS TO AFFECTED AREA 4 TIMES A DAY  11  . docusate sodium (COLACE) 100 MG capsule Take 100-200 mg by mouth at bedtime as needed for mild constipation or moderate constipation.     Marland Kitchen estradiol (ESTRACE) 0.1 MG/GM vaginal cream 1 gram vaginally twice weekly at bedtime 42.5 g 1  . fenofibrate 160 MG tablet TAKE 1 TABLET BY MOUTH EVERY DAY 90 tablet 1  . FLOVENT HFA 110 MCG/ACT inhaler TAKE 1 PUFF BY MOUTH TWICE A DAY 12 Inhaler 3  . FLUoxetine (PROZAC) 40 MG capsule TAKE 1 CAPSULE BY MOUTH EVERY DAY 90 capsule 1  . HYDROcodone-Acetaminophen 5-300 MG TABS Take 1 tablet by mouth 2 (two) times daily as needed. For severe pain 15 each 0  . ipratropium (ATROVENT HFA) 17 MCG/ACT inhaler Inhale 2 puffs into the lungs 2 (two) times daily.     Marland Kitchen ipratropium (ATROVENT) 0.02 % nebulizer solution Inhale into the lungs.    . isometheptene-acetaminophen-dichloralphenazone (MIDRIN) 65-100-325 MG capsule Take 1 capsule by mouth 4 (four) times daily as needed for migraine. Maximum 5 capsules in 12 hours for migraine headaches, 8 capsules in 24 hours for tension headaches.    . levothyroxine (SYNTHROID, LEVOTHROID) 100 MCG tablet Take 1 tablet (100 mcg total) by mouth every morning. 90 tablet 3  . lisinopril (PRINIVIL,ZESTRIL) 10 MG tablet TAKE 1 TABLET BY MOUTH EVERY DAY 90 tablet 1  . magnesium oxide (MAG-OX) 400 MG tablet Take 400 mg by mouth at bedtime.     . montelukast (SINGULAIR) 10 MG tablet Take 1 tablet (10 mg total) by mouth daily. 30 tablet 5  . omeprazole (PRILOSEC) 40 MG  capsule TAKE ONE CAPSULE BY MOUTH DAILY 90 capsule 3  . triamcinolone cream (KENALOG) 0.1 % Apply 1 application topically 2 (two) times daily as needed. For skin breakdown    . vitamin B-12 (CYANOCOBALAMIN) 1000 MCG tablet Take 1,000 mcg by mouth daily.    Marland Kitchen levofloxacin (LEVAQUIN) 500 MG tablet Take 1 tablet (500 mg total) by mouth daily. (Patient not taking: Reported on 12/13/2018) 7 tablet 0  . predniSONE (STERAPRED UNI-PAK 21 TAB) 10 MG (21) TBPK tablet As directed (Patient not  taking: Reported on 12/13/2018) 21 tablet 0   No current facility-administered medications for this visit.      ALLERGIES: Advair diskus [fluticasone-salmeterol]; Codeine; Erythromycin; Morphine and related; Nsaids; and Sulfa antibiotics  Family History  Problem Relation Age of Onset  . Breast cancer Maternal Grandmother   . Stroke Maternal Grandmother   . Bladder Cancer Father   . Prostate cancer Father   . Heart attack Father   . Aortic aneurysm Father   . Congestive Heart Failure Father   . Colon cancer Paternal Grandmother   . Aneurysm Paternal Grandmother   . Stroke Paternal Grandmother   . Congestive Heart Failure Paternal Grandmother   . Dementia Mother   . Stroke Mother   . Dementia Maternal Grandfather   . Stroke Maternal Grandfather   . Dementia Paternal Grandfather   . Stroke Paternal Grandfather     Social History   Socioeconomic History  . Marital status: Married    Spouse name: Not on file  . Number of children: Not on file  . Years of education: Not on file  . Highest education level: Not on file  Occupational History  . Not on file  Social Needs  . Financial resource strain: Not on file  . Food insecurity:    Worry: Not on file    Inability: Not on file  . Transportation needs:    Medical: Not on file    Non-medical: Not on file  Tobacco Use  . Smoking status: Former Research scientist (life sciences)  . Smokeless tobacco: Never Used  Substance and Sexual Activity  . Alcohol use: No  . Drug use: No   . Sexual activity: Not Currently    Partners: Male    Birth control/protection: Post-menopausal  Lifestyle  . Physical activity:    Days per week: Not on file    Minutes per session: Not on file  . Stress: Not on file  Relationships  . Social connections:    Talks on phone: Not on file    Gets together: Not on file    Attends religious service: Not on file    Active member of club or organization: Not on file    Attends meetings of clubs or organizations: Not on file    Relationship status: Not on file  . Intimate partner violence:    Fear of current or ex partner: Not on file    Emotionally abused: Not on file    Physically abused: Not on file    Forced sexual activity: Not on file  Other Topics Concern  . Not on file  Social History Narrative  . Not on file    Review of Systems  Constitutional: Negative.   HENT: Negative.   Eyes: Negative.   Respiratory: Negative.   Cardiovascular: Negative.   Gastrointestinal: Negative.   Genitourinary: Negative.   Musculoskeletal: Negative.   Skin: Negative.   Neurological: Negative.   Endo/Heme/Allergies: Negative.   Psychiatric/Behavioral: Negative.     PHYSICAL EXAMINATION:    There were no vitals taken for this visit.    General appearance: alert, cooperative and appears stated age  Pelvic: External genitalia:  no lesions              Urethra:  normal appearing urethra with no masses, tenderness or lesions              Bartholins and Skenes: normal                 Vagina:the pessary was removed  and cleaned, the area of erosion in the right vaginal apex has worsened some and increased in size to 2 cm               Cervix:  no lesions  Chaperone was present for exam.  ASSESSMENT Genital prolapse, overall well controlled with the #3 ring pessary with support. She has lost some weight and noted that with a URI last week the pessary was coming to the opening of her vagina with coughing. On exam it feels as if a larger  pessary would fit Vaginal irritation from the pessary    PLAN Will use vaginal estrogen qhs x 1 week and then f/u. Discussed that she may even need another week of estrogen prior to replacing the pessary Will try a bigger pessary Need to have her ready for her trip in 2 weeks.   An After Visit Summary was printed and given to the patient.  ~15 minutes face to face time of which over 50% was spent in counseling.

## 2018-12-13 ENCOUNTER — Encounter: Payer: Self-pay | Admitting: Obstetrics and Gynecology

## 2018-12-13 ENCOUNTER — Ambulatory Visit (INDEPENDENT_AMBULATORY_CARE_PROVIDER_SITE_OTHER): Payer: Medicare Other | Admitting: Obstetrics and Gynecology

## 2018-12-13 ENCOUNTER — Other Ambulatory Visit: Payer: Self-pay

## 2018-12-13 VITALS — BP 128/84 | HR 64 | Wt 135.4 lb

## 2018-12-13 DIAGNOSIS — Z4689 Encounter for fitting and adjustment of other specified devices: Secondary | ICD-10-CM

## 2018-12-13 DIAGNOSIS — N8111 Cystocele, midline: Secondary | ICD-10-CM | POA: Diagnosis not present

## 2018-12-13 DIAGNOSIS — N814 Uterovaginal prolapse, unspecified: Secondary | ICD-10-CM | POA: Diagnosis not present

## 2018-12-13 DIAGNOSIS — N898 Other specified noninflammatory disorders of vagina: Secondary | ICD-10-CM

## 2018-12-13 DIAGNOSIS — N899 Noninflammatory disorder of vagina, unspecified: Secondary | ICD-10-CM | POA: Diagnosis not present

## 2018-12-17 ENCOUNTER — Other Ambulatory Visit: Payer: Self-pay | Admitting: Internal Medicine

## 2018-12-18 ENCOUNTER — Ambulatory Visit (INDEPENDENT_AMBULATORY_CARE_PROVIDER_SITE_OTHER): Payer: Medicare Other | Admitting: Obstetrics and Gynecology

## 2018-12-18 ENCOUNTER — Other Ambulatory Visit: Payer: Self-pay

## 2018-12-18 ENCOUNTER — Telehealth: Payer: Self-pay | Admitting: Obstetrics and Gynecology

## 2018-12-18 ENCOUNTER — Encounter: Payer: Self-pay | Admitting: Obstetrics and Gynecology

## 2018-12-18 VITALS — BP 138/90 | HR 76 | Wt 136.0 lb

## 2018-12-18 DIAGNOSIS — T8389XA Other specified complication of genitourinary prosthetic devices, implants and grafts, initial encounter: Secondary | ICD-10-CM

## 2018-12-18 DIAGNOSIS — N899 Noninflammatory disorder of vagina, unspecified: Secondary | ICD-10-CM | POA: Diagnosis not present

## 2018-12-18 DIAGNOSIS — N898 Other specified noninflammatory disorders of vagina: Secondary | ICD-10-CM

## 2018-12-18 DIAGNOSIS — N760 Acute vaginitis: Secondary | ICD-10-CM

## 2018-12-18 MED ORDER — FLUCONAZOLE 150 MG PO TABS: 150.0000 mg | ORAL_TABLET | Freq: Once | ORAL | 0 refills | Status: AC

## 2018-12-18 MED ORDER — BETAMETHASONE VALERATE 0.1 % EX OINT: 1.0000 "application " | TOPICAL_OINTMENT | Freq: Two times a day (BID) | CUTANEOUS | 0 refills | Status: DC

## 2018-12-18 NOTE — Progress Notes (Signed)
GYNECOLOGY  VISIT   HPI: 68 y.o.   Married White or Caucasian Not Hispanic or Latino  female   G1P1001 with No LMP recorded. Patient is postmenopausal.   here for vaginal irritation and itching. She had her pessary removed and left out last week after exam showed vaginal irritation. She has been using the estrace cream weekly. She started having vaginal irritation a few days ago, severe itching yesterday. She used a one day anti-fungal cream. Still very uncomfortable, but slightly better today.     GYNECOLOGIC HISTORY: No LMP recorded. Patient is postmenopausal. Contraception:NA Menopausal hormone therapy: vaginal estrogen only        OB History    Gravida  1   Para  1   Term  1   Preterm      AB      Living  1     SAB      TAB      Ectopic      Multiple      Live Births  1              Patient Active Problem List   Diagnosis Date Noted  . GAD (generalized anxiety disorder) 09/01/2018  . Chronic migraine without aura without status migrainosus, not intractable 09/01/2018  . Encounter for long-term (current) use of medications 09/01/2018  . Asthma without status asthmaticus 01/30/2018  . Hyperlipidemia, unspecified 01/30/2018  . Hypertension 01/30/2018  . Obesity, unspecified 01/30/2018  . Hypothyroidism 01/30/2018  . Acute respiratory failure with hypoxia ([place]) 12/31/2017  . Fatty infiltration of liver 12/10/2015  . Abdominal pain, LLQ (left lower quadrant) 11/03/2015  . History of adenomatous polyp of colon 11/03/2015  . Hallux valgus, left 09/18/2014  . 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  . Shoulder arthritis 12/19/2013    Past Medical History:  Diagnosis Date  . Allergic rhinitis   . Anxiety   . Asthma   . Blood transfusion without reported diagnosis   . Depression   . Diverticulosis   . Dyspnea   . Esophagitis   . GERD (gastroesophageal reflux disease)   . Headache   . Heart murmur   . Hyperlipemia   . Hypertension   .  Hypothyroidism   . Obesity   . Osteopenia   . Palpitations   . Pneumothorax   . Sleep apnea     Past Surgical History:  Procedure Laterality Date  . BUNIONECTOMY    . CATARACT EXTRACTION W/PHACO Right 10/11/2016   Procedure: CATARACT EXTRACTION PHACO AND INTRAOCULAR LENS PLACEMENT (IOC);  Surgeon: Birder Robson, MD;  Location: [facility] ORS;  Service: Ophthalmology;  Laterality: Right;  Lot# [id number] H [place]: 00:37.7 AP%: 18.1 CDE: 6.80  . CATARACT EXTRACTION W/PHACO Left 11/08/2016   Procedure: CATARACT EXTRACTION PHACO AND INTRAOCULAR LENS PLACEMENT (IOC);  Surgeon: Birder Robson, MD;  Location: [facility] ORS;  Service: Ophthalmology;  Laterality: Left;  PACK LOT: [id number] H US:00:32 AP:44 CDE:6.46  . COLONOSCOPY    . COLONOSCOPY WITH PROPOFOL N/A 11/26/2015   Procedure: COLONOSCOPY WITH PROPOFOL;  Surgeon: Manya Silvas, MD;  Location: [facility] ENDOSCOPY;  Service: Endoscopy;  Laterality: N/A;  . EYE SURGERY    . TONSILLECTOMY      Current Outpatient Medications  Medication Sig Dispense Refill  . acetaminophen (TYLENOL) 500 MG tablet Take 1,000 mg by mouth 2 (two) times daily as needed for mild pain or moderate pain.    Marland Kitchen albuterol (PROVENTIL HFA;VENTOLIN HFA) 108 (90 Base) MCG/ACT inhaler INHALE 2 PUFFS BY MOUTH EVERY  6 HOURS 18 Inhaler 1  . ALPRAZolam (XANAX) 0.25 MG tablet Take 1 tablet (0.25 mg total) by mouth 2 (two) times daily as needed for anxiety. 60 tablet 2  . amLODipine (NORVASC) 5 MG tablet Take 1 tablet (5 mg total) by mouth daily. 90 tablet 3  . ATROVENT HFA 17 MCG/ACT inhaler INHALE 2 PUFFS 4 TIMES DAILY 12.9 Inhaler 5  . bisacodyl (DULCOLAX) 5 MG EC tablet Take 5-10 mg by mouth at bedtime as needed for moderate constipation. Two at night    . cetirizine (ZYRTEC) 10 MG tablet Take 10 mg by mouth daily.    . cholecalciferol (VITAMIN D) 1000 units tablet Take 1,000 Units by mouth at bedtime.    . clindamycin (CLEOCIN T) 1 % external solution Apply 1 application topically 2  (two) times daily as needed. For skin breakdown 30 mL 2  . cyclobenzaprine (FLEXERIL) 5 MG tablet Take 1-2 tablets (5-10 mg total) by mouth 2 (two) times daily as needed for muscle spasms. 120 tablet 1  . diclofenac sodium (VOLTAREN) 1 % GEL APPLY 2 GRAMS TO AFFECTED AREA 4 TIMES A DAY  11  . docusate sodium (COLACE) 100 MG capsule Take 100-200 mg by mouth at bedtime as needed for mild constipation or moderate constipation.     Marland Kitchen estradiol (ESTRACE) 0.1 MG/GM vaginal cream 1 gram vaginally twice weekly at bedtime 42.5 g 1  . fenofibrate 160 MG tablet TAKE 1 TABLET BY MOUTH EVERY DAY 90 tablet 1  . FLOVENT HFA 110 MCG/ACT inhaler TAKE 1 PUFF BY MOUTH TWICE A DAY 12 Inhaler 3  . FLUoxetine (PROZAC) 40 MG capsule TAKE 1 CAPSULE BY MOUTH EVERY DAY 90 capsule 1  . HYDROcodone-Acetaminophen 5-300 MG TABS Take 1 tablet by mouth 2 (two) times daily as needed. For severe pain 15 each 0  . ipratropium (ATROVENT) 0.02 % nebulizer solution Inhale into the lungs.    . isometheptene-acetaminophen-dichloralphenazone (MIDRIN) 65-100-325 MG capsule Take 1 capsule by mouth 4 (four) times daily as needed for migraine. Maximum 5 capsules in 12 hours for migraine headaches, 8 capsules in 24 hours for tension headaches.    Marland Kitchen levofloxacin (LEVAQUIN) 500 MG tablet Take 1 tablet (500 mg total) by mouth daily. (Patient not taking: Reported on 12/13/2018) 7 tablet 0  . levothyroxine (SYNTHROID, LEVOTHROID) 100 MCG tablet Take 1 tablet (100 mcg total) by mouth every morning. 90 tablet 3  . lisinopril (PRINIVIL,ZESTRIL) 10 MG tablet TAKE 1 TABLET BY MOUTH EVERY DAY 90 tablet 1  . magnesium oxide (MAG-OX) 400 MG tablet Take 400 mg by mouth at bedtime.     . montelukast (SINGULAIR) 10 MG tablet Take 1 tablet (10 mg total) by mouth daily. 30 tablet 5  . omeprazole (PRILOSEC) 40 MG capsule TAKE ONE CAPSULE BY MOUTH DAILY 90 capsule 3  . predniSONE (STERAPRED UNI-PAK 21 TAB) 10 MG (21) TBPK tablet As directed (Patient not taking:  Reported on 12/13/2018) 21 tablet 0  . triamcinolone cream (KENALOG) 0.1 % Apply 1 application topically 2 (two) times daily as needed. For skin breakdown    . vitamin B-12 (CYANOCOBALAMIN) 1000 MCG tablet Take 1,000 mcg by mouth daily.     No current facility-administered medications for this visit.      ALLERGIES: Advair diskus [fluticasone-salmeterol]; Codeine; Erythromycin; Morphine and related; Nsaids; and Sulfa antibiotics  Family History  Problem Relation Age of Onset  . Breast cancer Maternal Grandmother   . Stroke Maternal Grandmother   . Bladder Cancer Father   .  Prostate cancer Father   . Heart attack Father   . Aortic aneurysm Father   . Congestive Heart Failure Father   . Colon cancer Paternal Grandmother   . Aneurysm Paternal Grandmother   . Stroke Paternal Grandmother   . Congestive Heart Failure Paternal Grandmother   . Dementia Mother   . Stroke Mother   . Dementia Maternal Grandfather   . Stroke Maternal Grandfather   . Dementia Paternal Grandfather   . Stroke Paternal Grandfather     Social History   Socioeconomic History  . Marital status: Married    Spouse name: Not on file  . Number of children: Not on file  . Years of education: Not on file  . Highest education level: Not on file  Occupational History  . Not on file  Social Needs  . Financial resource strain: Not on file  . Food insecurity:    Worry: Not on file    Inability: Not on file  . Transportation needs:    Medical: Not on file    Non-medical: Not on file  Tobacco Use  . Smoking status: Former Research scientist (life sciences)  . Smokeless tobacco: Never Used  Substance and Sexual Activity  . Alcohol use: No  . Drug use: No  . Sexual activity: Not Currently    Partners: Male    Birth control/protection: Post-menopausal  Lifestyle  . Physical activity:    Days per week: Not on file    Minutes per session: Not on file  . Stress: Not on file  Relationships  . Social connections:    Talks on phone: Not  on file    Gets together: Not on file    Attends religious service: Not on file    Active member of club or organization: Not on file    Attends meetings of clubs or organizations: Not on file    Relationship status: Not on file  . Intimate partner violence:    Fear of current or ex partner: Not on file    Emotionally abused: Not on file    Physically abused: Not on file    Forced sexual activity: Not on file  Other Topics Concern  . Not on file  Social History Narrative  . Not on file    ROS  PHYSICAL EXAMINATION:    There were no vitals taken for this visit.    General appearance: alert, cooperative and appears stated age   Pelvic: External genitalia:  no lesions              Urethra:  normal appearing urethra with no masses, tenderness or lesions              Bartholins and Skenes: normal                 Vagina: mildly atrophic appearing vagina with normal appearing right vaginal apex. Area of erosion posterior to the cervix, mild              Cervix: no lesions               Chaperone was present for exam.  Wet prep: ? Clue vs artifact, no trich, ++ wbc KOH: no yeast PH: 5   ASSESSMENT Vulvovaginitis, suspect partially treated yeast  Vaginal irritation from pessary    PLAN Steroid ointment Diflucan  Affirm sent Continue with vaginal estrogen F/U early next week to reevaluate for pessary reinsertion    An After Visit Summary was printed and given to the  patient.

## 2018-12-18 NOTE — Telephone Encounter (Signed)
Spoke with patient. Patient reports external and internal vaginal itching and burning, symptoms started 1/26. Used OTC monistat on 1/27, washed with vinegar, symptoms improved slightly. Denies urinary symptoms, vaginal bleeding, d/c or odor. Requesting OV today.   Scheduled reviewed with Dr. Talbert Nan. OV scheduled for today at 3:45pm. Patient verbalizes understanding and is agreeable. Encounter closed.

## 2018-12-18 NOTE — Telephone Encounter (Signed)
Patient believes she has a yeast infection. Unsure whether it may be from levaquin she took last week or from cream she has been applying daily. Took one day monistat yesterday and feels a little better today. States she rinsed with vinegar water last night before bed. Would like to be seen today. Dr. Gentry Fitz schedule full. Patient lives an hour away.

## 2018-12-19 LAB — VAGINITIS/VAGINOSIS, DNA PROBE
Candida Species: NEGATIVE
Gardnerella vaginalis: NEGATIVE
Trichomonas vaginosis: NEGATIVE

## 2018-12-20 ENCOUNTER — Ambulatory Visit: Payer: Medicare Other | Admitting: Obstetrics and Gynecology

## 2018-12-24 ENCOUNTER — Other Ambulatory Visit: Payer: Self-pay

## 2018-12-24 ENCOUNTER — Ambulatory Visit (INDEPENDENT_AMBULATORY_CARE_PROVIDER_SITE_OTHER): Payer: Medicare Other | Admitting: Obstetrics and Gynecology

## 2018-12-24 ENCOUNTER — Encounter: Payer: Self-pay | Admitting: Obstetrics and Gynecology

## 2018-12-24 VITALS — BP 136/74 | HR 84 | Resp 14 | Ht 61.0 in | Wt 132.0 lb

## 2018-12-24 DIAGNOSIS — N899 Noninflammatory disorder of vagina, unspecified: Secondary | ICD-10-CM

## 2018-12-24 DIAGNOSIS — T8389XA Other specified complication of genitourinary prosthetic devices, implants and grafts, initial encounter: Secondary | ICD-10-CM

## 2018-12-24 DIAGNOSIS — N8189 Other female genital prolapse: Secondary | ICD-10-CM

## 2018-12-24 DIAGNOSIS — N898 Other specified noninflammatory disorders of vagina: Secondary | ICD-10-CM

## 2018-12-24 DIAGNOSIS — Z4689 Encounter for fitting and adjustment of other specified devices: Secondary | ICD-10-CM | POA: Diagnosis not present

## 2018-12-24 NOTE — Progress Notes (Signed)
GYNECOLOGY  VISIT   HPI: 68 y.o.   Married White or Caucasian Not Hispanic or Latino  female   G1P1001 with No LMP recorded. Patient is postmenopausal.   here for recheck of prolapse and vaginal irritation.  Her pessary was left out a few weeks ago secondary to vaginal irritation. She has been using the vaginal estrogen nightly and is here for f/u. Prior to taking this pessary out she was starting to intermittently feel like the pessary was coming down (#3 ring with support).  She is uncomfortable from having the pessary out, having lower back pain and some vaginal discomfort.    GYNECOLOGIC HISTORY: No LMP recorded. Patient is postmenopausal. Contraception: Postmenopausal Menopausal hormone therapy: Estrace cream        OB History    Gravida  1   Para  1   Term  1   Preterm      AB      Living  1     SAB      TAB      Ectopic      Multiple      Live Births  1              Patient Active Problem List   Diagnosis Date Noted  . GAD (generalized anxiety disorder) 09/01/2018  . Chronic migraine without aura without status migrainosus, not intractable 09/01/2018  . Encounter for long-term (current) use of medications 09/01/2018  . Asthma without status asthmaticus 01/30/2018  . Hyperlipidemia, unspecified 01/30/2018  . Hypertension 01/30/2018  . Obesity, unspecified 01/30/2018  . Hypothyroidism 01/30/2018  . Acute respiratory failure with hypoxia ([place]) 12/31/2017  . Fatty infiltration of liver 12/10/2015  . Abdominal pain, LLQ (left lower quadrant) 11/03/2015  . History of adenomatous polyp of colon 11/03/2015  . Hallux valgus, left 09/18/2014  . 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  . Shoulder arthritis 12/19/2013    Past Medical History:  Diagnosis Date  . Allergic rhinitis   . Anxiety   . Asthma   . Blood transfusion without reported diagnosis   . Depression   . Diverticulosis   . Dyspnea   . Esophagitis   . GERD (gastroesophageal reflux  disease)   . Headache   . Heart murmur   . Hyperlipemia   . Hypertension   . Hypothyroidism   . Obesity   . Osteopenia   . Palpitations   . Pneumothorax   . Sleep apnea     Past Surgical History:  Procedure Laterality Date  . BUNIONECTOMY    . CATARACT EXTRACTION W/PHACO Right 10/11/2016   Procedure: CATARACT EXTRACTION PHACO AND INTRAOCULAR LENS PLACEMENT (IOC);  Surgeon: Birder Robson, MD;  Location: [facility] ORS;  Service: Ophthalmology;  Laterality: Right;  Lot# [id number] H [place]: 00:37.7 AP%: 18.1 CDE: 6.80  . CATARACT EXTRACTION W/PHACO Left 11/08/2016   Procedure: CATARACT EXTRACTION PHACO AND INTRAOCULAR LENS PLACEMENT (IOC);  Surgeon: Birder Robson, MD;  Location: [facility] ORS;  Service: Ophthalmology;  Laterality: Left;  PACK LOT: [id number] H US:00:32 AP:44 CDE:6.46  . COLONOSCOPY    . COLONOSCOPY WITH PROPOFOL N/A 11/26/2015   Procedure: COLONOSCOPY WITH PROPOFOL;  Surgeon: Manya Silvas, MD;  Location: [facility] ENDOSCOPY;  Service: Endoscopy;  Laterality: N/A;  . EYE SURGERY    . TONSILLECTOMY      Current Outpatient Medications  Medication Sig Dispense Refill  . acetaminophen (TYLENOL) 500 MG tablet Take 1,000 mg by mouth 2 (two) times daily as needed for mild pain or moderate pain.    Marland Kitchen  albuterol (PROVENTIL HFA;VENTOLIN HFA) 108 (90 Base) MCG/ACT inhaler INHALE 2 PUFFS BY MOUTH EVERY 6 HOURS 18 Inhaler 1  . ALPRAZolam (XANAX) 0.25 MG tablet Take 1 tablet (0.25 mg total) by mouth 2 (two) times daily as needed for anxiety. 60 tablet 2  . amLODipine (NORVASC) 5 MG tablet Take 1 tablet (5 mg total) by mouth daily. 90 tablet 3  . ATROVENT HFA 17 MCG/ACT inhaler INHALE 2 PUFFS 4 TIMES DAILY 12.9 Inhaler 5  . betamethasone valerate ointment (VALISONE) 0.1 % Apply 1 application topically 2 (two) times daily. Use a small amount 15 g 0  . bisacodyl (DULCOLAX) 5 MG EC tablet Take 5-10 mg by mouth at bedtime as needed for moderate constipation. Two at night    . cetirizine (ZYRTEC) 10  MG tablet Take 10 mg by mouth daily.    . cholecalciferol (VITAMIN D) 1000 units tablet Take 1,000 Units by mouth at bedtime.    . clindamycin (CLEOCIN T) 1 % external solution Apply 1 application topically 2 (two) times daily as needed. For skin breakdown 30 mL 2  . cyclobenzaprine (FLEXERIL) 5 MG tablet Take 1-2 tablets (5-10 mg total) by mouth 2 (two) times daily as needed for muscle spasms. 120 tablet 1  . diclofenac sodium (VOLTAREN) 1 % GEL APPLY 2 GRAMS TO AFFECTED AREA 4 TIMES A DAY  11  . docusate sodium (COLACE) 100 MG capsule Take 100-200 mg by mouth at bedtime as needed for mild constipation or moderate constipation.     Marland Kitchen estradiol (ESTRACE) 0.1 MG/GM vaginal cream 1 gram vaginally twice weekly at bedtime 42.5 g 1  . fenofibrate 160 MG tablet TAKE 1 TABLET BY MOUTH EVERY DAY 90 tablet 1  . FLOVENT HFA 110 MCG/ACT inhaler TAKE 1 PUFF BY MOUTH TWICE A DAY 12 Inhaler 3  . FLUoxetine (PROZAC) 40 MG capsule TAKE 1 CAPSULE BY MOUTH EVERY DAY 90 capsule 1  . HYDROcodone-Acetaminophen 5-300 MG TABS Take 1 tablet by mouth 2 (two) times daily as needed. For severe pain 15 each 0  . ipratropium (ATROVENT) 0.02 % nebulizer solution Inhale into the lungs.    . isometheptene-acetaminophen-dichloralphenazone (MIDRIN) 65-100-325 MG capsule Take 1 capsule by mouth 4 (four) times daily as needed for migraine. Maximum 5 capsules in 12 hours for migraine headaches, 8 capsules in 24 hours for tension headaches.    Marland Kitchen levofloxacin (LEVAQUIN) 500 MG tablet Take 1 tablet (500 mg total) by mouth daily. 7 tablet 0  . levothyroxine (SYNTHROID, LEVOTHROID) 100 MCG tablet Take 1 tablet (100 mcg total) by mouth every morning. 90 tablet 3  . lisinopril (PRINIVIL,ZESTRIL) 10 MG tablet TAKE 1 TABLET BY MOUTH EVERY DAY 90 tablet 1  . magnesium oxide (MAG-OX) 400 MG tablet Take 400 mg by mouth at bedtime.     . montelukast (SINGULAIR) 10 MG tablet Take 1 tablet (10 mg total) by mouth daily. 30 tablet 5  . omeprazole  (PRILOSEC) 40 MG capsule TAKE ONE CAPSULE BY MOUTH DAILY 90 capsule 3  . predniSONE (STERAPRED UNI-PAK 21 TAB) 10 MG (21) TBPK tablet As directed 21 tablet 0  . triamcinolone cream (KENALOG) 0.1 % Apply 1 application topically 2 (two) times daily as needed. For skin breakdown    . vitamin B-12 (CYANOCOBALAMIN) 1000 MCG tablet Take 1,000 mcg by mouth daily.     No current facility-administered medications for this visit.      ALLERGIES: Advair diskus [fluticasone-salmeterol]; Codeine; Erythromycin; Morphine and related; Nsaids; and Sulfa antibiotics  Family  History  Problem Relation Age of Onset  . Breast cancer Maternal Grandmother   . Stroke Maternal Grandmother   . Bladder Cancer Father   . Prostate cancer Father   . Heart attack Father   . Aortic aneurysm Father   . Congestive Heart Failure Father   . Colon cancer Paternal Grandmother   . Aneurysm Paternal Grandmother   . Stroke Paternal Grandmother   . Congestive Heart Failure Paternal Grandmother   . Dementia Mother   . Stroke Mother   . Dementia Maternal Grandfather   . Stroke Maternal Grandfather   . Dementia Paternal Grandfather   . Stroke Paternal Grandfather     Social History   Socioeconomic History  . Marital status: Married    Spouse name: Not on file  . Number of children: Not on file  . Years of education: Not on file  . Highest education level: Not on file  Occupational History  . Not on file  Social Needs  . Financial resource strain: Not on file  . Food insecurity:    Worry: Not on file    Inability: Not on file  . Transportation needs:    Medical: Not on file    Non-medical: Not on file  Tobacco Use  . Smoking status: Former Research scientist (life sciences)  . Smokeless tobacco: Never Used  Substance and Sexual Activity  . Alcohol use: No  . Drug use: No  . Sexual activity: Not Currently    Partners: Male    Birth control/protection: Post-menopausal  Lifestyle  . Physical activity:    Days per week: Not on file     Minutes per session: Not on file  . Stress: Not on file  Relationships  . Social connections:    Talks on phone: Not on file    Gets together: Not on file    Attends religious service: Not on file    Active member of club or organization: Not on file    Attends meetings of clubs or organizations: Not on file    Relationship status: Not on file  . Intimate partner violence:    Fear of current or ex partner: Not on file    Emotionally abused: Not on file    Physically abused: Not on file    Forced sexual activity: Not on file  Other Topics Concern  . Not on file  Social History Narrative  . Not on file    Review of Systems  Constitutional: Negative.   HENT: Negative.   Eyes: Negative.   Respiratory: Negative.   Cardiovascular: Negative.   Gastrointestinal: Negative.   Genitourinary:       Loss of urine with cough or sneeze  Musculoskeletal: Positive for back pain.  Skin: Negative.   Neurological: Negative.   Endo/Heme/Allergies: Negative.     PHYSICAL EXAMINATION:    BP 136/74 (BP Location: Right Arm, Patient Position: Sitting, Cuff Size: Normal)   Pulse 84   Resp 14   Ht 5\' 1"  (1.549 m)   Wt 132 lb (59.9 kg)   BMI 24.94 kg/m     General appearance: alert, cooperative and appears stated age  Pelvic: External genitalia:  no lesions              Urethra:  normal appearing urethra with no masses, tenderness or lesions              Bartholins and Skenes: normal  Vagina: normal appearing vagina with normal color and discharge, anterior erosion resolved, erosion posterior to the cervix on the patient's right is almost resolved, now just minimal erythema. 1 gram of estrace cream placed, #3 ring pessary with support reinserted              Cervix: no lesions  Chaperone was present for exam.  ASSESSMENT Genital prolapse, had been well controlled with the #3 ring pessary with support, recently feels the pessary moving down at times.  Vaginal irritation  from the pessary has healed well after a break from the pessary    PLAN #3 ring pessary replaced She is going on a cruise next week, will return after her cruise to try a larger pessary She will use the estrace cream 3 x a week until that visit   An After Visit Summary was printed and given to the patient.

## 2019-01-09 NOTE — Progress Notes (Signed)
GYNECOLOGY  VISIT   HPI: 68 y.o.   Married White or Caucasian Not Hispanic or Latino  female   G1P1001 with No LMP recorded. Patient is postmenopausal.   here for pessary recheck.  She was having some irritation from her pessary, it was left out in 1/20 and replaced earlier this month. This pessary was slipping during her increased activities, she had to push it up.  She just got back from a cruise, had a nice time.   GYNECOLOGIC HISTORY: No LMP recorded. Patient is postmenopausal. Contraception: Postmen[place]ausal Menopausal hormone therapy: Estrace cream        OB History    Gravida  1   Para  1   Term  1   Preterm      AB      Living  1     SAB      TAB      Ectopic      Multiple      Live Births  1              Patient Active Problem List   Diagnosis Date Noted  . GAD (generalized anxiety disorder) 09/01/2018  . Chronic migraine without aura without status migrainosus, not intractable 09/01/2018  . Encounter for long-term (current) use of medications 09/01/2018  . Asthma without status asthmaticus 01/30/2018  . Hyperlipidemia, unspecified 01/30/2018  . Hypertension 01/30/2018  . Obesity, unspecified 01/30/2018  . Hypothyroidism 01/30/2018  . Acute respiratory failure with hypoxia ([place]) 12/31/2017  . Fatty infiltration of liver 12/10/2015  . Abdominal pain, LLQ (left lower quadrant) 11/03/2015  . History of adenomatous polyp of colon 11/03/2015  . Hallux valgus, left 09/18/2014  . 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  . Shoulder arthritis 12/19/2013    Past Medical History:  Diagnosis Date  . Allergic rhinitis   . Anxiety   . Asthma   . Blood transfusion without reported diagnosis   . Depression   . Diverticulosis   . Dyspnea   . Esophagitis   . GERD (gastroesophageal reflux disease)   . Headache   . Heart murmur   . Hyperlipemia   . Hypertension   . Hypothyroidism   . Obesity   . Osteopenia   . Palpitations   . Pneumothorax   .  Sleep apnea     Past Surgical History:  Procedure Laterality Date  . BUNIONECTOMY    . CATARACT EXTRACTION W/PHACO Right 10/11/2016   Procedure: CATARACT EXTRACTION PHACO AND INTRAOCULAR LENS PLACEMENT (IOC);  Surgeon: Birder Robson, MD;  Location: [facility] ORS;  Service: Ophthalmology;  Laterality: Right;  Lot# [id number] H [place]: 00:37.7 AP%: 18.1 CDE: 6.80  . CATARACT EXTRACTION W/PHACO Left 11/08/2016   Procedure: CATARACT EXTRACTION PHACO AND INTRAOCULAR LENS PLACEMENT (IOC);  Surgeon: Birder Robson, MD;  Location: [facility] ORS;  Service: Ophthalmology;  Laterality: Left;  PACK LOT: [id number] H US:00:32 AP:44 CDE:6.46  . COLONOSCOPY    . COLONOSCOPY WITH PROPOFOL N/A 11/26/2015   Procedure: COLONOSCOPY WITH PROPOFOL;  Surgeon: Manya Silvas, MD;  Location: [facility] ENDOSCOPY;  Service: Endoscopy;  Laterality: N/A;  . EYE SURGERY    . TONSILLECTOMY      Current Outpatient Medications  Medication Sig Dispense Refill  . acetaminophen (TYLENOL) 500 MG tablet Take 1,000 mg by mouth 2 (two) times daily as needed for mild pain or moderate pain.    Marland Kitchen albuterol (PROVENTIL HFA;VENTOLIN HFA) 108 (90 Base) MCG/ACT inhaler INHALE 2 PUFFS BY MOUTH EVERY 6 HOURS 18 Inhaler 1  .  ALPRAZolam (XANAX) 0.25 MG tablet Take 1 tablet (0.25 mg total) by mouth 2 (two) times daily as needed for anxiety. 60 tablet 2  . amLODipine (NORVASC) 5 MG tablet Take 1 tablet (5 mg total) by mouth daily. 90 tablet 3  . ATROVENT HFA 17 MCG/ACT inhaler INHALE 2 PUFFS 4 TIMES DAILY 12.9 Inhaler 5  . betamethasone valerate ointment (VALISONE) 0.1 % Apply 1 application topically 2 (two) times daily. Use a small amount 15 g 0  . bisacodyl (DULCOLAX) 5 MG EC tablet Take 5-10 mg by mouth at bedtime as needed for moderate constipation. Two at night    . cetirizine (ZYRTEC) 10 MG tablet Take 10 mg by mouth daily.    . cholecalciferol (VITAMIN D) 1000 units tablet Take 1,000 Units by mouth at bedtime.    . clindamycin (CLEOCIN T) 1 %  external solution Apply 1 application topically 2 (two) times daily as needed. For skin breakdown 30 mL 2  . cyclobenzaprine (FLEXERIL) 5 MG tablet Take 1-2 tablets (5-10 mg total) by mouth 2 (two) times daily as needed for muscle spasms. 120 tablet 1  . diclofenac sodium (VOLTAREN) 1 % GEL APPLY 2 GRAMS TO AFFECTED AREA 4 TIMES A DAY  11  . docusate sodium (COLACE) 100 MG capsule Take 100-200 mg by mouth at bedtime as needed for mild constipation or moderate constipation.     Marland Kitchen estradiol (ESTRACE) 0.1 MG/GM vaginal cream 1 gram vaginally twice weekly at bedtime 42.5 g 1  . fenofibrate 160 MG tablet TAKE 1 TABLET BY MOUTH EVERY DAY 90 tablet 1  . FLOVENT HFA 110 MCG/ACT inhaler TAKE 1 PUFF BY MOUTH TWICE A DAY 12 Inhaler 3  . FLUoxetine (PROZAC) 40 MG capsule TAKE 1 CAPSULE BY MOUTH EVERY DAY 90 capsule 1  . HYDROcodone-Acetaminophen 5-300 MG TABS Take 1 tablet by mouth 2 (two) times daily as needed. For severe pain 15 each 0  . ipratropium (ATROVENT) 0.02 % nebulizer solution Inhale into the lungs.    . isometheptene-acetaminophen-dichloralphenazone (MIDRIN) 65-100-325 MG capsule Take 1 capsule by mouth 4 (four) times daily as needed for migraine. Maximum 5 capsules in 12 hours for migraine headaches, 8 capsules in 24 hours for tension headaches.    Marland Kitchen levofloxacin (LEVAQUIN) 500 MG tablet Take 1 tablet (500 mg total) by mouth daily. 7 tablet 0  . levothyroxine (SYNTHROID, LEVOTHROID) 100 MCG tablet Take 1 tablet (100 mcg total) by mouth every morning. 90 tablet 3  . lisinopril (PRINIVIL,ZESTRIL) 10 MG tablet TAKE 1 TABLET BY MOUTH EVERY DAY 90 tablet 1  . magnesium oxide (MAG-OX) 400 MG tablet Take 400 mg by mouth at bedtime.     . montelukast (SINGULAIR) 10 MG tablet Take 1 tablet (10 mg total) by mouth daily. 30 tablet 5  . omeprazole (PRILOSEC) 40 MG capsule TAKE ONE CAPSULE BY MOUTH DAILY 90 capsule 3  . triamcinolone cream (KENALOG) 0.1 % Apply 1 application topically 2 (two) times daily as  needed. For skin breakdown    . vitamin B-12 (CYANOCOBALAMIN) 1000 MCG tablet Take 1,000 mcg by mouth daily.     No current facility-administered medications for this visit.      ALLERGIES: Advair diskus [fluticasone-salmeterol]; Codeine; Erythromycin; Morphine and related; Nsaids; and Sulfa antibiotics  Family History  Problem Relation Age of Onset  . Breast cancer Maternal Grandmother   . Stroke Maternal Grandmother   . Bladder Cancer Father   . Prostate cancer Father   . Heart attack Father   .  Aortic aneurysm Father   . Congestive Heart Failure Father   . Colon cancer Paternal Grandmother   . Aneurysm Paternal Grandmother   . Stroke Paternal Grandmother   . Congestive Heart Failure Paternal Grandmother   . Dementia Mother   . Stroke Mother   . Dementia Maternal Grandfather   . Stroke Maternal Grandfather   . Dementia Paternal Grandfather   . Stroke Paternal Grandfather     Social History   Socioeconomic History  . Marital status: Married    Spouse name: Not on file  . Number of children: Not on file  . Years of education: Not on file  . Highest education level: Not on file  Occupational History  . Not on file  Social Needs  . Financial resource strain: Not on file  . Food insecurity:    Worry: Not on file    Inability: Not on file  . Transportation needs:    Medical: Not on file    Non-medical: Not on file  Tobacco Use  . Smoking status: Former Research scientist (life sciences)  . Smokeless tobacco: Never Used  Substance and Sexual Activity  . Alcohol use: No  . Drug use: No  . Sexual activity: Not Currently    Partners: Male    Birth control/protection: Post-menopausal  Lifestyle  . Physical activity:    Days per week: Not on file    Minutes per session: Not on file  . Stress: Not on file  Relationships  . Social connections:    Talks on phone: Not on file    Gets together: Not on file    Attends religious service: Not on file    Active member of club or organization: Not  on file    Attends meetings of clubs or organizations: Not on file    Relationship status: Not on file  . Intimate partner violence:    Fear of current or ex partner: Not on file    Emotionally abused: Not on file    Physically abused: Not on file    Forced sexual activity: Not on file  Other Topics Concern  . Not on file  Social History Narrative  . Not on file    Review of Systems  Constitutional: Negative.   HENT: Negative.   Eyes: Negative.   Respiratory: Negative.   Cardiovascular: Negative.   Gastrointestinal: Negative.   Genitourinary: Negative.   Musculoskeletal: Negative.   Skin: Negative.   Neurological: Negative.   Endo/Heme/Allergies: Negative.   Psychiatric/Behavioral: Negative.     PHYSICAL EXAMINATION:    BP 140/80 (BP Location: Right Arm, Patient Position: Sitting, Cuff Size: Normal)   Wt 138 lb 3.2 oz (62.7 kg)   BMI 26.11 kg/m     General appearance: alert, cooperative and appears stated age  Pelvic: External genitalia:  no lesions              Urethra:  normal appearing urethra with no masses, tenderness or lesions              Bartholins and Skenes: normal                 Vagina: the pessary was removed and cleaned. Normal appearing vagina with normal color and discharge, no lesions or irritation.              Cervix: no lesions  Fitted with a #5 ring pessary with support, very comfortable in the office. Fitting pessary removed, 1 gram of estrace cream placed, #5 ring pessary  with support was placed.   Chaperone was present for exam.  ASSESSMENT Genital prolapse (uterine prolapse and cystocele), was controlled with the #3 ring pessary, but it started to slip and cause irritaiton    PLAN Fitted with a #5 ring pessary with support, comfortable.  Use 1 gram of estrace cream 2 x a week F/U in one week   An After Visit Summary was printed and given to the patient.

## 2019-01-11 ENCOUNTER — Other Ambulatory Visit: Payer: Self-pay | Admitting: Internal Medicine

## 2019-01-11 ENCOUNTER — Encounter: Payer: Self-pay | Admitting: Internal Medicine

## 2019-01-14 ENCOUNTER — Encounter: Payer: Self-pay | Admitting: Obstetrics and Gynecology

## 2019-01-14 ENCOUNTER — Other Ambulatory Visit: Payer: Self-pay

## 2019-01-14 ENCOUNTER — Ambulatory Visit (INDEPENDENT_AMBULATORY_CARE_PROVIDER_SITE_OTHER): Payer: Medicare Other | Admitting: Obstetrics and Gynecology

## 2019-01-14 VITALS — BP 140/80 | Wt 138.2 lb

## 2019-01-14 DIAGNOSIS — Z4689 Encounter for fitting and adjustment of other specified devices: Secondary | ICD-10-CM

## 2019-01-14 DIAGNOSIS — N814 Uterovaginal prolapse, unspecified: Secondary | ICD-10-CM

## 2019-01-14 DIAGNOSIS — N8111 Cystocele, midline: Secondary | ICD-10-CM

## 2019-01-15 ENCOUNTER — Encounter: Payer: Self-pay | Admitting: Adult Health

## 2019-01-15 ENCOUNTER — Other Ambulatory Visit: Payer: Self-pay

## 2019-01-15 ENCOUNTER — Ambulatory Visit (INDEPENDENT_AMBULATORY_CARE_PROVIDER_SITE_OTHER): Payer: Medicare Other | Admitting: Adult Health

## 2019-01-15 VITALS — BP 108/72 | HR 77 | Resp 16 | Ht 61.0 in | Wt 137.0 lb

## 2019-01-15 DIAGNOSIS — F411 Generalized anxiety disorder: Secondary | ICD-10-CM | POA: Diagnosis not present

## 2019-01-15 DIAGNOSIS — G8929 Other chronic pain: Secondary | ICD-10-CM | POA: Diagnosis not present

## 2019-01-15 DIAGNOSIS — E782 Mixed hyperlipidemia: Secondary | ICD-10-CM | POA: Diagnosis not present

## 2019-01-15 DIAGNOSIS — I10 Essential (primary) hypertension: Secondary | ICD-10-CM

## 2019-01-15 DIAGNOSIS — M545 Low back pain, unspecified: Secondary | ICD-10-CM

## 2019-01-15 MED ORDER — ALPRAZOLAM 0.25 MG PO TABS: 0.2500 mg | ORAL_TABLET | Freq: Two times a day (BID) | ORAL | 2 refills | Status: DC | PRN

## 2019-01-15 MED ORDER — HYDROCODONE-ACETAMINOPHEN 5-300 MG PO TABS: 1.0000 | ORAL_TABLET | Freq: Two times a day (BID) | ORAL | 0 refills | Status: DC | PRN

## 2019-01-15 NOTE — Progress Notes (Signed)
[facility] [place],  [id number]  Internal MEDICINE  Office Visit Note  Patient Name: Bonnie West  [id number]  [id number]  Date of Service: 01/15/2019  Chief Complaint  Patient presents with  . Anxiety    needs refill on medication   . Hyperlipidemia  . Hypertension    HPI  Pt is here for follow up on Anxiety, HLD, and HTN.  She reports her blood pressure has been doing well.  Denies chest pain, sob, palpitations or headache. Her HLD is stable per her last Lipid panel.  She reports her anxiety is doing well at this time, however she is in need of a refill on her xanax.     Current Medication: Outpatient Encounter Medications as of 01/15/2019  Medication Sig  . acetaminophen (TYLENOL) 500 MG tablet Take 1,000 mg by mouth 2 (two) times daily as needed for mild pain or moderate pain.  Marland Kitchen albuterol (PROVENTIL HFA;VENTOLIN HFA) 108 (90 Base) MCG/ACT inhaler INHALE 2 PUFFS BY MOUTH EVERY 6 HOURS  . ALPRAZolam (XANAX) 0.25 MG tablet Take 1 tablet (0.25 mg total) by mouth 2 (two) times daily as needed for anxiety.  Marland Kitchen amLODipine (NORVASC) 5 MG tablet Take 1 tablet (5 mg total) by mouth daily.  . ATROVENT HFA 17 MCG/ACT inhaler INHALE 2 PUFFS 4 TIMES DAILY  . betamethasone valerate ointment (VALISONE) 0.1 % Apply 1 application topically 2 (two) times daily. Use a small amount  . bisacodyl (DULCOLAX) 5 MG EC tablet Take 5-10 mg by mouth at bedtime as needed for moderate constipation. Two at night  . cetirizine (ZYRTEC) 10 MG tablet Take 10 mg by mouth daily.  . cholecalciferol (VITAMIN D) 1000 units tablet Take 1,000 Units by mouth at bedtime.  . clindamycin (CLEOCIN T) 1 % external solution Apply 1 application topically 2 (two) times daily as needed. For skin breakdown  . cyclobenzaprine (FLEXERIL) 5 MG tablet Take 1-2 tablets (5-10 mg total) by mouth 2 (two) times daily as needed for muscle spasms.  . diclofenac sodium (VOLTAREN) 1 % GEL APPLY 2 GRAMS TO  AFFECTED AREA 4 TIMES A DAY  . docusate sodium (COLACE) 100 MG capsule Take 100-200 mg by mouth at bedtime as needed for mild constipation or moderate constipation.   Marland Kitchen estradiol (ESTRACE) 0.1 MG/GM vaginal cream 1 gram vaginally twice weekly at bedtime  . fenofibrate 160 MG tablet TAKE 1 TABLET BY MOUTH EVERY DAY  . FLOVENT HFA 110 MCG/ACT inhaler TAKE 1 PUFF BY MOUTH TWICE A DAY  . FLUoxetine (PROZAC) 40 MG capsule TAKE 1 CAPSULE BY MOUTH EVERY DAY  . ipratropium (ATROVENT) 0.02 % nebulizer solution Inhale into the lungs.  . isometheptene-acetaminophen-dichloralphenazone (MIDRIN) 65-100-325 MG capsule Take 1 capsule by mouth 4 (four) times daily as needed for migraine. Maximum 5 capsules in 12 hours for migraine headaches, 8 capsules in 24 hours for tension headaches.  . levothyroxine (SYNTHROID, LEVOTHROID) 100 MCG tablet Take 1 tablet (100 mcg total) by mouth every morning.  Marland Kitchen lisinopril (PRINIVIL,ZESTRIL) 10 MG tablet TAKE 1 TABLET BY MOUTH EVERY DAY  . magnesium oxide (MAG-OX) 400 MG tablet Take 400 mg by mouth at bedtime.   . montelukast (SINGULAIR) 10 MG tablet Take 1 tablet (10 mg total) by mouth daily.  Marland Kitchen omeprazole (PRILOSEC) 40 MG capsule TAKE ONE CAPSULE BY MOUTH DAILY  . triamcinolone cream (KENALOG) 0.1 % Apply 1 application topically 2 (two) times daily as needed. For skin breakdown  . vitamin B-12 (CYANOCOBALAMIN) 1000  MCG tablet Take 1,000 mcg by mouth daily.  . [DISCONTINUED] ALPRAZolam (XANAX) 0.25 MG tablet Take 1 tablet (0.25 mg total) by mouth 2 (two) times daily as needed for anxiety.  Marland Kitchen HYDROcodone-Acetaminophen 5-300 MG TABS Take 1 tablet by mouth 2 (two) times daily as needed. For severe pain  . [DISCONTINUED] HYDROcodone-Acetaminophen 5-300 MG TABS Take 1 tablet by mouth 2 (two) times daily as needed. For severe pain (Patient not taking: Reported on 01/15/2019)  . [DISCONTINUED] levofloxacin (LEVAQUIN) 500 MG tablet Take 1 tablet (500 mg total) by mouth daily. (Patient  not taking: Reported on 01/15/2019)   No facility-administered encounter medications on file as of 01/15/2019.     Surgical History: Past Surgical History:  Procedure Laterality Date  . BUNIONECTOMY    . CATARACT EXTRACTION W/PHACO Right 10/11/2016   Procedure: CATARACT EXTRACTION PHACO AND INTRAOCULAR LENS PLACEMENT (IOC);  Surgeon: Birder Robson, MD;  Location: [facility] ORS;  Service: Ophthalmology;  Laterality: Right;  Lot# [id number] H [place]: 00:37.7 AP%: 18.1 CDE: 6.80  . CATARACT EXTRACTION W/PHACO Left 11/08/2016   Procedure: CATARACT EXTRACTION PHACO AND INTRAOCULAR LENS PLACEMENT (IOC);  Surgeon: Birder Robson, MD;  Location: [facility] ORS;  Service: Ophthalmology;  Laterality: Left;  PACK LOT: [id number] H US:00:32 AP:44 CDE:6.46  . COLONOSCOPY    . COLONOSCOPY WITH PROPOFOL N/A 11/26/2015   Procedure: COLONOSCOPY WITH PROPOFOL;  Surgeon: Manya Silvas, MD;  Location: [facility] ENDOSCOPY;  Service: Endoscopy;  Laterality: N/A;  . EYE SURGERY    . TONSILLECTOMY      Medical History: Past Medical History:  Diagnosis Date  . Allergic rhinitis   . Anxiety   . Asthma   . Blood transfusion without reported diagnosis   . Depression   . Diverticulosis   . Dyspnea   . Esophagitis   . GERD (gastroesophageal reflux disease)   . Headache   . Heart murmur   . Hyperlipemia   . Hypertension   . Hypothyroidism   . Obesity   . Osteopenia   . Palpitations   . Pneumothorax   . Sleep apnea     Family History: Family History  Problem Relation Age of Onset  . Breast cancer Maternal Grandmother   . Stroke Maternal Grandmother   . Bladder Cancer Father   . Prostate cancer Father   . Heart attack Father   . Aortic aneurysm Father   . Congestive Heart Failure Father   . Colon cancer Paternal Grandmother   . Aneurysm Paternal Grandmother   . Stroke Paternal Grandmother   . Congestive Heart Failure Paternal Grandmother   . Dementia Mother   . Stroke Mother   . Dementia Maternal  Grandfather   . Stroke Maternal Grandfather   . Dementia Paternal Grandfather   . Stroke Paternal Grandfather     Social History   Socioeconomic History  . Marital status: Married    Spouse name: Not on file  . Number of children: Not on file  . Years of education: Not on file  . Highest education level: Not on file  Occupational History  . Not on file  Social Needs  . Financial resource strain: Not on file  . Food insecurity:    Worry: Not on file    Inability: Not on file  . Transportation needs:    Medical: Not on file    Non-medical: Not on file  Tobacco Use  . Smoking status: Former Research scientist (life sciences)  . Smokeless tobacco: Never Used  Substance and Sexual Activity  . Alcohol use:  No  . Drug use: No  . Sexual activity: Not Currently    Partners: Male    Birth control/protection: Post-menopausal  Lifestyle  . Physical activity:    Days per week: Not on file    Minutes per session: Not on file  . Stress: Not on file  Relationships  . Social connections:    Talks on phone: Not on file    Gets together: Not on file    Attends religious service: Not on file    Active member of club or organization: Not on file    Attends meetings of clubs or organizations: Not on file    Relationship status: Not on file  . Intimate partner violence:    Fear of current or ex partner: Not on file    Emotionally abused: Not on file    Physically abused: Not on file    Forced sexual activity: Not on file  Other Topics Concern  . Not on file  Social History Narrative  . Not on file    Review of Systems  Constitutional: Negative for chills, fatigue and unexpected weight change.  HENT: Negative for congestion, rhinorrhea, sneezing and sore throat.   Eyes: Negative for photophobia, pain and redness.  Respiratory: Negative for cough, chest tightness and shortness of breath.   Cardiovascular: Negative for chest pain and palpitations.  Gastrointestinal: Negative for abdominal pain,  constipation, diarrhea, nausea and vomiting.  Endocrine: Negative.   Genitourinary: Negative for dysuria and frequency.  Musculoskeletal: Negative for arthralgias, back pain, joint swelling and neck pain.  Skin: Negative for rash.  Allergic/Immunologic: Negative.   Neurological: Negative for tremors and numbness.  Hematological: Negative for adenopathy. Does not bruise/bleed easily.  Psychiatric/Behavioral: Negative for behavioral problems and sleep disturbance. The patient is not nervous/anxious.     Vital Signs: BP 108/72   Pulse 77   Resp 16   Ht 5\' 1"  (1.549 m)   Wt 137 lb (62.1 kg)   SpO2 95%   BMI 25.89 kg/m    Physical Exam Vitals signs and nursing note reviewed.  Constitutional:      General: She is not in acute distress.    Appearance: She is well-developed. She is not diaphoretic.  HENT:     Head: Normocephalic and atraumatic.     Mouth/Throat:     Pharynx: No oropharyngeal exudate.  Eyes:     Pupils: Pupils are equal, round, and reactive to light.  Neck:     Musculoskeletal: Normal range of motion and neck supple.     Thyroid: No thyromegaly.     Vascular: No JVD.     Trachea: No tracheal deviation.  Cardiovascular:     Rate and Rhythm: Normal rate and regular rhythm.     Heart sounds: Normal heart sounds. No murmur. No friction rub. No gallop.   Pulmonary:     Effort: Pulmonary effort is normal. No respiratory distress.     Breath sounds: Normal breath sounds. No wheezing or rales.  Chest:     Chest wall: No tenderness.  Abdominal:     Palpations: Abdomen is soft.     Tenderness: There is no abdominal tenderness. There is no guarding.  Musculoskeletal: Normal range of motion.  Lymphadenopathy:     Cervical: No cervical adenopathy.  Skin:    General: Skin is warm and dry.  Neurological:     Mental Status: She is alert and oriented to person, place, and time.     Cranial Nerves: No cranial  nerve deficit.  Psychiatric:        Behavior: Behavior  normal.        Thought Content: Thought content normal.        Judgment: Judgment normal.    Assessment/Plan: 1. GAD (generalized anxiety disorder) Patient Xanax prescription refilled at this time.  Patient denies any overt issues with her anxiety. - ALPRAZolam (XANAX) 0.25 MG tablet; Take 1 tablet (0.25 mg total) by mouth 2 (two) times daily as needed for anxiety.  Dispense: 60 tablet; Refill: 2 Reviewed risks and possible side effects associated with taking opiates, benzodiazepines and other CNS depressants. Combination of these could cause dizziness and drowsiness. Advised patient not to drive or operate machinery when taking these medications, as patient's and other's life can be at risk and will have consequences. Patient verbalized understanding in this matter. Dependence and abuse for these drugs will be monitored closely. A Controlled substance policy and procedure is on file which allows [facility] to order a urine drug screen test at any visit. Patient understands and agrees with the plan 2. Mixed hyperlipidemia Stable, continue current medication as prescribed  3. Essential hypertension, benign Stable, patient's blood pressure 108/72 today.  Continue current medications.  4. Chronic midline low back pain without sciatica Renew patient's prescription for hydrocodone.  Dispensing 15 tablets at this time.  Last prescription was 3 months ago. - HYDROcodone-Acetaminophen 5-300 MG TABS; Take 1 tablet by mouth 2 (two) times daily as needed. For severe pain  Dispense: 15 each; Refill: 0  General Counseling: Brandan verbalizes understanding of the findings of todays visit and agrees with plan of treatment. I have discussed any further diagnostic evaluation that may be needed or ordered today. We also reviewed her medications today. she has been encouraged to call the office with any questions or concerns that should arise related to todays visit.    No orders of the defined types  were placed in this encounter.   Meds ordered this encounter  Medications  . ALPRAZolam (XANAX) 0.25 MG tablet    Sig: Take 1 tablet (0.25 mg total) by mouth 2 (two) times daily as needed for anxiety.    Dispense:  60 tablet    Refill:  2  . HYDROcodone-Acetaminophen 5-300 MG TABS    Sig: Take 1 tablet by mouth 2 (two) times daily as needed. For severe pain    Dispense:  15 each    Refill:  0    Time spent: 25 Minutes   This patient was seen by Orson Gear AGNP-C in Collaboration with Dr Lavera Guise as a part of collaborative care agreement     Kendell Bane AGNP-C Internal medicine

## 2019-01-15 NOTE — Patient Instructions (Signed)
Living With Anxiety  After being diagnosed with an anxiety disorder, you may be relieved to know why you have felt or behaved a certain way. It is natural to also feel overwhelmed about the treatment ahead and what it will mean for your life. With care and support, you can manage this condition and recover from it. How to cope with anxiety Dealing with stress Stress is your body's reaction to life changes and events, both good and bad. Stress can last just a few hours or it can be ongoing. Stress can play a major role in anxiety, so it is important to learn both how to cope with stress and how to think about it differently. Talk with your health care provider or a counselor to learn more about stress reduction. He or she may suggest some stress reduction techniques, such as:  Music therapy. This can include creating or listening to music that you enjoy and that inspires you.  Mindfulness-based meditation. This involves being aware of your normal breaths, rather than trying to control your breathing. It can be done while sitting or walking.  Centering prayer. This is a kind of meditation that involves focusing on a word, phrase, or sacred image that is meaningful to you and that brings you peace.  Deep breathing. To do this, expand your stomach and inhale slowly through your nose. Hold your breath for 3-5 seconds. Then exhale slowly, allowing your stomach muscles to relax.  Self-talk. This is a skill where you identify thought patterns that lead to anxiety reactions and correct those thoughts.  Muscle relaxation. This involves tensing muscles then relaxing them. Choose a stress reduction technique that fits your lifestyle and personality. Stress reduction techniques take time and practice. Set aside 5-15 minutes a day to do them. Therapists can offer training in these techniques. The training may be covered by some insurance plans. Other things you can do to manage stress include:  Keeping a  stress diary. This can help you learn what triggers your stress and ways to control your response.  Thinking about how you respond to certain situations. You may not be able to control everything, but you can control your reaction.  Making time for activities that help you relax, and not feeling guilty about spending your time in this way. Therapy combined with coping and stress-reduction skills provides the best chance for successful treatment. Medicines Medicines can help ease symptoms. Medicines for anxiety include:  Anti-anxiety drugs.  Antidepressants.  Beta-blockers. Medicines may be used as the main treatment for anxiety disorder, along with therapy, or if other treatments are not working. Medicines should be prescribed by a health care provider. Relationships Relationships can play a big part in helping you recover. Try to spend more time connecting with trusted friends and family members. Consider going to couples counseling, taking family education classes, or going to family therapy. Therapy can help you and others better understand the condition. How to recognize changes in your condition Everyone has a different response to treatment for anxiety. Recovery from anxiety happens when symptoms decrease and stop interfering with your daily activities at home or work. This may mean that you will start to:  Have better concentration and focus.  Sleep better.  Be less irritable.  Have more energy.  Have improved memory. It is important to recognize when your condition is getting worse. Contact your health care provider if your symptoms interfere with home or work and you do not feel like your condition is improving. Where   to find help and support: You can get help and support from these sources:  Self-help groups.  Online and community organizations.  A trusted spiritual leader.  Couples counseling.  Family education classes.  Family therapy. Follow these instructions  at home:  Eat a healthy diet that includes plenty of vegetables, fruits, whole grains, low-fat dairy products, and lean protein. Do not eat a lot of foods that are high in solid fats, added sugars, or salt.  Exercise. Most adults should do the following: ? Exercise for at least 150 minutes each week. The exercise should increase your heart rate and make you sweat (moderate-intensity exercise). ? Strengthening exercises at least twice a week.  Cut down on caffeine, tobacco, alcohol, and other potentially harmful substances.  Get the right amount and quality of sleep. Most adults need 7-9 hours of sleep each night.  Make choices that simplify your life.  Take over-the-counter and prescription medicines only as told by your health care provider.  Avoid caffeine, alcohol, and certain over-the-counter cold medicines. These may make you feel worse. Ask your pharmacist which medicines to avoid.  Keep all follow-up visits as told by your health care provider. This is important. Questions to ask your health care provider  Would I benefit from therapy?  How often should I follow up with a health care provider?  How long do I need to take medicine?  Are there any long-term side effects of my medicine?  Are there any alternatives to taking medicine? Contact a health care provider if:  You have a hard time staying focused or finishing daily tasks.  You spend many hours a day feeling worried about everyday life.  You become exhausted by worry.  You start to have headaches, feel tense, or have nausea.  You urinate more than normal.  You have diarrhea. Get help right away if:  You have a racing heart and shortness of breath.  You have thoughts of hurting yourself or others. If you ever feel like you may hurt yourself or others, or have thoughts about taking your own life, get help right away. You can go to your nearest emergency department or call:  Your local emergency services  (911 in the [place]  A suicide crisis helpline, such as the National Suicide Prevention Lifeline at 1-800-273-8255. This is open 24-hours a day. Summary  Taking steps to deal with stress can help calm you.  Medicines cannot cure anxiety disorders, but they can help ease symptoms.  Family, friends, and partners can play a big part in helping you recover from an anxiety disorder. This information is not intended to replace advice given to you by your health care provider. Make sure you discuss any questions you have with your health care provider. Document Released: 11/01/2016 Document Revised: 11/01/2016 Document Reviewed: 11/01/2016 Elsevier Interactive Patient Education  2019 [employer]  Hypertension Hypertension is another name for high blood pressure. High blood pressure forces your heart to work harder to pump blood. This can cause problems over time. There are two numbers in a blood pressure reading. There is a top number (systolic) over a bottom number (diastolic). It is best to have a blood pressure below 120/80. Healthy choices can help lower your blood pressure. You may need medicine to help lower your blood pressure if:  Your blood pressure cannot be lowered with healthy choices.  Your blood pressure is higher than 130/80. Follow these instructions at home: Eating and drinking   If directed, follow the DASH eating   plan. This diet includes: ? Filling half of your plate at each meal with fruits and vegetables. ? Filling one quarter of your plate at each meal with whole grains. Whole grains include whole wheat pasta, brown rice, and whole grain bread. ? Eating or drinking low-fat dairy products, such as skim milk or low-fat yogurt. ? Filling one quarter of your plate at each meal with low-fat (lean) proteins. Low-fat proteins include fish, skinless chicken, eggs, beans, and tofu. ? Avoiding fatty meat, cured and processed meat, or chicken with skin. ? Avoiding premade or  processed food.  Eat less than 1,500 mg of salt (sodium) a day.  Limit alcohol use to no more than 1 drink a day for nonpregnant women and 2 drinks a day for men. One drink equals 12 oz of beer, 5 oz of wine, or 1 oz of hard liquor. Lifestyle  Work with your doctor to stay at a healthy weight or to lose weight. Ask your doctor what the best weight is for you.  Get at least 30 minutes of exercise that causes your heart to beat faster (aerobic exercise) most days of the week. This may include walking, swimming, or biking.  Get at least 30 minutes of exercise that strengthens your muscles (resistance exercise) at least 3 days a week. This may include lifting weights or pilates.  Do not use any products that contain nicotine or tobacco. This includes cigarettes and e-cigarettes. If you need help quitting, ask your doctor.  Check your blood pressure at home as told by your doctor.  Keep all follow-up visits as told by your doctor. This is important. Medicines  Take over-the-counter and prescription medicines only as told by your doctor. Follow directions carefully.  Do not skip doses of blood pressure medicine. The medicine does not work as well if you skip doses. Skipping doses also puts you at risk for problems.  Ask your doctor about side effects or reactions to medicines that you should watch for. Contact a doctor if:  You think you are having a reaction to the medicine you are taking.  You have headaches that keep coming back (recurring).  You feel dizzy.  You have swelling in your ankles.  You have trouble with your vision. Get help right away if:  You get a very bad headache.  You start to feel confused.  You feel weak or numb.  You feel faint.  You get very bad pain in your: ? Chest. ? Belly (abdomen).  You throw up (vomit) more than once.  You have trouble breathing. Summary  Hypertension is another name for high blood pressure.  Making healthy choices  can help lower blood pressure. If your blood pressure cannot be controlled with healthy choices, you may need to take medicine. This information is not intended to replace advice given to you by your health care provider. Make sure you discuss any questions you have with your health care provider. Document Released: 04/25/2008 Document Revised: 10/05/2016 Document Reviewed: 10/05/2016 Elsevier Interactive Patient Education  2019 Elsevier Inc.  

## 2019-01-21 ENCOUNTER — Ambulatory Visit: Payer: Medicare Other | Admitting: Obstetrics and Gynecology

## 2019-01-22 NOTE — Progress Notes (Signed)
GYNECOLOGY  VISIT   HPI: 68 y.o.   Married White or Caucasian Not Hispanic or Latino  female   G1P1001 with No LMP recorded. Patient is postmenopausal.   here for pessary recheck. Last week she was fitted for a new pessary, #5 ring with support. Her other pessary was slipping. Feels comfortable with this pessary. If she has a coughing fit or is straining with constipation and she checks the pessary is a little low so she pushes the pessary up a bit. No discomfort, she can only tell it has shifted if she checks. She has been constipated all week. No bleeding, no abnormal discharge.   GYNECOLOGIC HISTORY: No LMP recorded. Patient is postmenopausal. Contraception: Postmenopausal Menopausal hormone therapy: Estrace cream        OB History    Gravida  1   Para  1   Term  1   Preterm      AB      Living  1     SAB      TAB      Ectopic      Multiple      Live Births  1              Patient Active Problem List   Diagnosis Date Noted  . GAD (generalized anxiety disorder) 09/01/2018  . Chronic migraine without aura without status migrainosus, not intractable 09/01/2018  . Encounter for long-term (current) use of medications 09/01/2018  . Asthma without status asthmaticus 01/30/2018  . Hyperlipidemia, unspecified 01/30/2018  . Hypertension 01/30/2018  . Obesity, unspecified 01/30/2018  . Hypothyroidism 01/30/2018  . Acute respiratory failure with hypoxia ([place]) 12/31/2017  . Fatty infiltration of liver 12/10/2015  . Abdominal pain, LLQ (left lower quadrant) 11/03/2015  . History of adenomatous polyp of colon 11/03/2015  . Hallux valgus, left 09/18/2014  . 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  . Shoulder arthritis 12/19/2013    Past Medical History:  Diagnosis Date  . Allergic rhinitis   . Anxiety   . Asthma   . Blood transfusion without reported diagnosis   . Depression   . Diverticulosis   . Dyspnea   . Esophagitis   . GERD (gastroesophageal reflux  disease)   . Headache   . Heart murmur   . Hyperlipemia   . Hypertension   . Hypothyroidism   . Obesity   . Osteopenia   . Palpitations   . Pneumothorax   . Sleep apnea     Past Surgical History:  Procedure Laterality Date  . BUNIONECTOMY    . CATARACT EXTRACTION W/PHACO Right 10/11/2016   Procedure: CATARACT EXTRACTION PHACO AND INTRAOCULAR LENS PLACEMENT (IOC);  Surgeon: Birder Robson, MD;  Location: [facility] ORS;  Service: Ophthalmology;  Laterality: Right;  Lot# [id number] H [place]: 00:37.7 AP%: 18.1 CDE: 6.80  . CATARACT EXTRACTION W/PHACO Left 11/08/2016   Procedure: CATARACT EXTRACTION PHACO AND INTRAOCULAR LENS PLACEMENT (IOC);  Surgeon: Birder Robson, MD;  Location: [facility] ORS;  Service: Ophthalmology;  Laterality: Left;  PACK LOT: [id number] H US:00:32 AP:44 CDE:6.46  . COLONOSCOPY    . COLONOSCOPY WITH PROPOFOL N/A 11/26/2015   Procedure: COLONOSCOPY WITH PROPOFOL;  Surgeon: Manya Silvas, MD;  Location: [facility] ENDOSCOPY;  Service: Endoscopy;  Laterality: N/A;  . EYE SURGERY    . TONSILLECTOMY      Current Outpatient Medications  Medication Sig Dispense Refill  . acetaminophen (TYLENOL) 500 MG tablet Take 1,000 mg by mouth 2 (two) times daily as needed for mild pain or  moderate pain.    Marland Kitchen albuterol (PROVENTIL HFA;VENTOLIN HFA) 108 (90 Base) MCG/ACT inhaler INHALE 2 PUFFS BY MOUTH EVERY 6 HOURS 18 Inhaler 1  . ALPRAZolam (XANAX) 0.25 MG tablet Take 1 tablet (0.25 mg total) by mouth 2 (two) times daily as needed for anxiety. 60 tablet 2  . amLODipine (NORVASC) 5 MG tablet Take 1 tablet (5 mg total) by mouth daily. 90 tablet 3  . ATROVENT HFA 17 MCG/ACT inhaler INHALE 2 PUFFS 4 TIMES DAILY 12.9 Inhaler 5  . betamethasone valerate ointment (VALISONE) 0.1 % Apply 1 application topically 2 (two) times daily. Use a small amount 15 g 0  . bisacodyl (DULCOLAX) 5 MG EC tablet Take 5-10 mg by mouth at bedtime as needed for moderate constipation. Two at night    . cetirizine (ZYRTEC) 10  MG tablet Take 10 mg by mouth daily.    . cholecalciferol (VITAMIN D) 1000 units tablet Take 1,000 Units by mouth at bedtime.    . clindamycin (CLEOCIN T) 1 % external solution Apply 1 application topically 2 (two) times daily as needed. For skin breakdown 30 mL 2  . cyclobenzaprine (FLEXERIL) 5 MG tablet Take 1-2 tablets (5-10 mg total) by mouth 2 (two) times daily as needed for muscle spasms. 120 tablet 1  . diclofenac sodium (VOLTAREN) 1 % GEL APPLY 2 GRAMS TO AFFECTED AREA 4 TIMES A DAY  11  . docusate sodium (COLACE) 100 MG capsule Take 100-200 mg by mouth at bedtime as needed for mild constipation or moderate constipation.     Marland Kitchen estradiol (ESTRACE) 0.1 MG/GM vaginal cream 1 gram vaginally twice weekly at bedtime 42.5 g 1  . fenofibrate 160 MG tablet TAKE 1 TABLET BY MOUTH EVERY DAY 90 tablet 1  . FLOVENT HFA 110 MCG/ACT inhaler TAKE 1 PUFF BY MOUTH TWICE A DAY 12 Inhaler 3  . FLUoxetine (PROZAC) 40 MG capsule TAKE 1 CAPSULE BY MOUTH EVERY DAY 90 capsule 1  . HYDROcodone-Acetaminophen 5-300 MG TABS Take 1 tablet by mouth 2 (two) times daily as needed. For severe pain 15 each 0  . ipratropium (ATROVENT) 0.02 % nebulizer solution Inhale into the lungs.    . isometheptene-acetaminophen-dichloralphenazone (MIDRIN) 65-100-325 MG capsule Take 1 capsule by mouth 4 (four) times daily as needed for migraine. Maximum 5 capsules in 12 hours for migraine headaches, 8 capsules in 24 hours for tension headaches.    . levothyroxine (SYNTHROID, LEVOTHROID) 100 MCG tablet Take 1 tablet (100 mcg total) by mouth every morning. 90 tablet 3  . lisinopril (PRINIVIL,ZESTRIL) 10 MG tablet TAKE 1 TABLET BY MOUTH EVERY DAY 90 tablet 1  . magnesium oxide (MAG-OX) 400 MG tablet Take 400 mg by mouth at bedtime.     . montelukast (SINGULAIR) 10 MG tablet Take 1 tablet (10 mg total) by mouth daily. 30 tablet 5  . omeprazole (PRILOSEC) 40 MG capsule TAKE ONE CAPSULE BY MOUTH DAILY 90 capsule 3  . triamcinolone cream  (KENALOG) 0.1 % Apply 1 application topically 2 (two) times daily as needed. For skin breakdown    . vitamin B-12 (CYANOCOBALAMIN) 1000 MCG tablet Take 1,000 mcg by mouth daily.     No current facility-administered medications for this visit.      ALLERGIES: Advair diskus [fluticasone-salmeterol]; Codeine; Erythromycin; Morphine and related; Nsaids; and Sulfa antibiotics  Family History  Problem Relation Age of Onset  . Breast cancer Maternal Grandmother   . Stroke Maternal Grandmother   . Bladder Cancer Father   . Prostate cancer  Father   . Heart attack Father   . Aortic aneurysm Father   . Congestive Heart Failure Father   . Colon cancer Paternal Grandmother   . Aneurysm Paternal Grandmother   . Stroke Paternal Grandmother   . Congestive Heart Failure Paternal Grandmother   . Dementia Mother   . Stroke Mother   . Dementia Maternal Grandfather   . Stroke Maternal Grandfather   . Dementia Paternal Grandfather   . Stroke Paternal Grandfather     Social History   Socioeconomic History  . Marital status: Married    Spouse name: Not on file  . Number of children: Not on file  . Years of education: Not on file  . Highest education level: Not on file  Occupational History  . Not on file  Social Needs  . Financial resource strain: Not on file  . Food insecurity:    Worry: Not on file    Inability: Not on file  . Transportation needs:    Medical: Not on file    Non-medical: Not on file  Tobacco Use  . Smoking status: Former Research scientist (life sciences)  . Smokeless tobacco: Never Used  Substance and Sexual Activity  . Alcohol use: No  . Drug use: No  . Sexual activity: Not Currently    Partners: Male    Birth control/protection: Post-menopausal  Lifestyle  . Physical activity:    Days per week: Not on file    Minutes per session: Not on file  . Stress: Not on file  Relationships  . Social connections:    Talks on phone: Not on file    Gets together: Not on file    Attends religious  service: Not on file    Active member of club or organization: Not on file    Attends meetings of clubs or organizations: Not on file    Relationship status: Not on file  . Intimate partner violence:    Fear of current or ex partner: Not on file    Emotionally abused: Not on file    Physically abused: Not on file    Forced sexual activity: Not on file  Other Topics Concern  . Not on file  Social History Narrative  . Not on file    Review of Systems  Constitutional: Negative.   HENT: Negative.   Eyes: Negative.   Respiratory: Negative.   Cardiovascular: Negative.   Gastrointestinal: Negative.   Genitourinary: Negative.   Musculoskeletal: Negative.   Skin: Negative.   Neurological: Negative.   Endo/Heme/Allergies: Negative.   Psychiatric/Behavioral: Negative.     PHYSICAL EXAMINATION:    BP 132/86 (BP Location: Right Arm, Patient Position: Sitting, Cuff Size: Normal)   Pulse 72   Wt 137 lb 12.8 oz (62.5 kg)   BMI 26.04 kg/m     General appearance: alert, cooperative and appears stated age  Pelvic: External genitalia:  no lesions              Urethra:  normal appearing urethra with no masses, tenderness or lesions              Bartholins and Skenes: normal                 Vagina: the pessary was removed and cleaned. There is very mild erythema in the posterior vagina behind the cervix. Mild atrophy. 1 gram of estrace cream was placed. The pessary was replaced.               Cervix:  no lesions  Chaperone was present for exam.  ASSESSMENT Genital prolapse, comfortable with her new pessary Mild vaginal erythema, suspect all of the straining she is doing with her recent constipation is causing the pessary to rub    PLAN Pessary cleaned and replaced Vaginal estrogen placed Discussed treating her constipation with metamucil or miralax.  F/U in 2 weeks   An After Visit Summary was printed and given to the patient.

## 2019-01-23 ENCOUNTER — Encounter: Payer: Self-pay | Admitting: Obstetrics and Gynecology

## 2019-01-23 ENCOUNTER — Ambulatory Visit (INDEPENDENT_AMBULATORY_CARE_PROVIDER_SITE_OTHER): Payer: Medicare Other | Admitting: Obstetrics and Gynecology

## 2019-01-23 VITALS — BP 132/86 | HR 72 | Wt 137.8 lb

## 2019-01-23 DIAGNOSIS — K5900 Constipation, unspecified: Secondary | ICD-10-CM

## 2019-01-23 DIAGNOSIS — Z4689 Encounter for fitting and adjustment of other specified devices: Secondary | ICD-10-CM

## 2019-01-23 NOTE — Patient Instructions (Signed)

## 2019-02-05 ENCOUNTER — Ambulatory Visit: Payer: Medicare Other | Admitting: Obstetrics and Gynecology

## 2019-02-12 ENCOUNTER — Other Ambulatory Visit: Payer: Self-pay | Admitting: Obstetrics and Gynecology

## 2019-02-12 ENCOUNTER — Ambulatory Visit: Payer: Medicare Other | Admitting: Obstetrics and Gynecology

## 2019-02-12 NOTE — Telephone Encounter (Signed)
Medication refill request: estradiol vag cream  Last AEX:  06/15/18 JJ Next AEX: has OV scheduled 02/13/19 Last MMG (if hormonal medication request): 07/26/18 [id number]:neg  Refill authorized: 07/25/18 #42.5g/ 1R. Today please advise.

## 2019-02-13 ENCOUNTER — Other Ambulatory Visit: Payer: Self-pay

## 2019-02-13 ENCOUNTER — Ambulatory Visit (INDEPENDENT_AMBULATORY_CARE_PROVIDER_SITE_OTHER): Payer: Medicare Other | Admitting: Obstetrics and Gynecology

## 2019-02-13 ENCOUNTER — Encounter: Payer: Self-pay | Admitting: Obstetrics and Gynecology

## 2019-02-13 VITALS — BP 130/80 | HR 76 | Resp 16 | Ht 60.75 in | Wt 136.0 lb

## 2019-02-13 DIAGNOSIS — T8389XA Other specified complication of genitourinary prosthetic devices, implants and grafts, initial encounter: Secondary | ICD-10-CM

## 2019-02-13 DIAGNOSIS — N898 Other specified noninflammatory disorders of vagina: Secondary | ICD-10-CM

## 2019-02-13 DIAGNOSIS — N8189 Other female genital prolapse: Secondary | ICD-10-CM

## 2019-02-13 DIAGNOSIS — N899 Noninflammatory disorder of vagina, unspecified: Secondary | ICD-10-CM | POA: Diagnosis not present

## 2019-02-13 DIAGNOSIS — Z4689 Encounter for fitting and adjustment of other specified devices: Secondary | ICD-10-CM | POA: Diagnosis not present

## 2019-02-13 DIAGNOSIS — R1032 Left lower quadrant pain: Secondary | ICD-10-CM

## 2019-02-13 NOTE — Patient Instructions (Signed)
Use the estrogen cream nightly for a week, then every other night for a week, then 2 x a week.

## 2019-02-13 NOTE — Progress Notes (Signed)
GYNECOLOGY  VISIT   HPI: 68 y.o.   Married White or Caucasian Not Hispanic or Latino  female   G1P1001 with No LMP recorded. Patient is postmenopausal.   here for pessary follow up. She has a #5 ring pessary with support. She has noticed some soreness in her LLQ for the last week. She describes it as an intermittent ache, last for a few minutes. Doesn't seem related to BM's. She has gone from constipation to loose stools. She is having 1-2 BM most days, yesterday no BM. No fevers.  Using her estrogen cream 2 x a week.  Not feeling like the pessary is shifting.   GYNECOLOGIC HISTORY: No LMP recorded. Patient is postmenopausal. Contraception: postmenopausal Menopausal hormone therapy: estrace cream        OB History    Gravida  1   Para  1   Term  1   Preterm      AB      Living  1     SAB      TAB      Ectopic      Multiple      Live Births  1              Patient Active Problem List   Diagnosis Date Noted  . GAD (generalized anxiety disorder) 09/01/2018  . Chronic migraine without aura without status migrainosus, not intractable 09/01/2018  . Encounter for long-term (current) use of medications 09/01/2018  . Asthma without status asthmaticus 01/30/2018  . Hyperlipidemia, unspecified 01/30/2018  . Hypertension 01/30/2018  . Obesity, unspecified 01/30/2018  . Hypothyroidism 01/30/2018  . Acute respiratory failure with hypoxia ([place]) 12/31/2017  . Fatty infiltration of liver 12/10/2015  . Abdominal pain, LLQ (left lower quadrant) 11/03/2015  . History of adenomatous polyp of colon 11/03/2015  . Hallux valgus, left 09/18/2014  . 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  . Shoulder arthritis 12/19/2013    Past Medical History:  Diagnosis Date  . Allergic rhinitis   . Anxiety   . Asthma   . Blood transfusion without reported diagnosis   . Depression   . Diverticulosis   . Dyspnea   . Esophagitis   . GERD (gastroesophageal reflux disease)   . Headache    . Heart murmur   . Hyperlipemia   . Hypertension   . Hypothyroidism   . Obesity   . Osteopenia   . Palpitations   . Pneumothorax   . Sleep apnea     Past Surgical History:  Procedure Laterality Date  . BUNIONECTOMY    . CATARACT EXTRACTION W/PHACO Right 10/11/2016   Procedure: CATARACT EXTRACTION PHACO AND INTRAOCULAR LENS PLACEMENT (IOC);  Surgeon: Birder Robson, MD;  Location: [facility] ORS;  Service: Ophthalmology;  Laterality: Right;  Lot# [id number] H [place]: 00:37.7 AP%: 18.1 CDE: 6.80  . CATARACT EXTRACTION W/PHACO Left 11/08/2016   Procedure: CATARACT EXTRACTION PHACO AND INTRAOCULAR LENS PLACEMENT (IOC);  Surgeon: Birder Robson, MD;  Location: [facility] ORS;  Service: Ophthalmology;  Laterality: Left;  PACK LOT: [id number] H US:00:32 AP:44 CDE:6.46  . COLONOSCOPY    . COLONOSCOPY WITH PROPOFOL N/A 11/26/2015   Procedure: COLONOSCOPY WITH PROPOFOL;  Surgeon: Manya Silvas, MD;  Location: [facility] ENDOSCOPY;  Service: Endoscopy;  Laterality: N/A;  . EYE SURGERY    . TONSILLECTOMY      Current Outpatient Medications  Medication Sig Dispense Refill  . acetaminophen (TYLENOL) 500 MG tablet Take 1,000 mg by mouth 2 (two) times daily as needed for mild pain  or moderate pain.    Marland Kitchen albuterol (PROVENTIL HFA;VENTOLIN HFA) 108 (90 Base) MCG/ACT inhaler INHALE 2 PUFFS BY MOUTH EVERY 6 HOURS 18 Inhaler 1  . ALPRAZolam (XANAX) 0.25 MG tablet Take 1 tablet (0.25 mg total) by mouth 2 (two) times daily as needed for anxiety. 60 tablet 2  . amLODipine (NORVASC) 5 MG tablet Take 1 tablet (5 mg total) by mouth daily. 90 tablet 3  . ATROVENT HFA 17 MCG/ACT inhaler INHALE 2 PUFFS 4 TIMES DAILY 12.9 Inhaler 5  . bisacodyl (DULCOLAX) 5 MG EC tablet Take 5-10 mg by mouth at bedtime as needed for moderate constipation. Two at night    . cetirizine (ZYRTEC) 10 MG tablet Take 10 mg by mouth daily.    . cholecalciferol (VITAMIN D) 1000 units tablet Take 1,000 Units by mouth at bedtime.    . clindamycin (CLEOCIN  T) 1 % external solution Apply 1 application topically 2 (two) times daily as needed. For skin breakdown 30 mL 2  . cyclobenzaprine (FLEXERIL) 5 MG tablet Take 1-2 tablets (5-10 mg total) by mouth 2 (two) times daily as needed for muscle spasms. 120 tablet 1  . docusate sodium (COLACE) 100 MG capsule Take 100-200 mg by mouth at bedtime as needed for mild constipation or moderate constipation.     Marland Kitchen estradiol (ESTRACE) 0.1 MG/GM vaginal cream INSERT 1 GRAM VAGINALLY TWICE WEEKLY AT BEDTIME 42.5 g 1  . fenofibrate 160 MG tablet TAKE 1 TABLET BY MOUTH EVERY DAY 90 tablet 1  . FLOVENT HFA 110 MCG/ACT inhaler TAKE 1 PUFF BY MOUTH TWICE A DAY 12 Inhaler 3  . FLUoxetine (PROZAC) 40 MG capsule TAKE 1 CAPSULE BY MOUTH EVERY DAY 90 capsule 1  . HYDROcodone-Acetaminophen 5-300 MG TABS Take 1 tablet by mouth 2 (two) times daily as needed. For severe pain 15 each 0  . ipratropium (ATROVENT) 0.02 % nebulizer solution Inhale into the lungs.    . isometheptene-acetaminophen-dichloralphenazone (MIDRIN) 65-100-325 MG capsule Take 1 capsule by mouth 4 (four) times daily as needed for migraine. Maximum 5 capsules in 12 hours for migraine headaches, 8 capsules in 24 hours for tension headaches.    . levothyroxine (SYNTHROID, LEVOTHROID) 100 MCG tablet Take 1 tablet (100 mcg total) by mouth every morning. 90 tablet 3  . lisinopril (PRINIVIL,ZESTRIL) 10 MG tablet TAKE 1 TABLET BY MOUTH EVERY DAY 90 tablet 1  . magnesium oxide (MAG-OX) 400 MG tablet Take 400 mg by mouth at bedtime.     . montelukast (SINGULAIR) 10 MG tablet Take 1 tablet (10 mg total) by mouth daily. 30 tablet 5  . omeprazole (PRILOSEC) 40 MG capsule TAKE ONE CAPSULE BY MOUTH DAILY 90 capsule 3  . triamcinolone cream (KENALOG) 0.1 % Apply 1 application topically 2 (two) times daily as needed. For skin breakdown    . vitamin B-12 (CYANOCOBALAMIN) 1000 MCG tablet Take 1,000 mcg by mouth daily.    . betamethasone valerate ointment (VALISONE) 0.1 % Apply 1  application topically 2 (two) times daily. Use a small amount (Patient not taking: Reported on 02/13/2019) 15 g 0  . diclofenac sodium (VOLTAREN) 1 % GEL APPLY 2 GRAMS TO AFFECTED AREA 4 TIMES A DAY  11   No current facility-administered medications for this visit.      ALLERGIES: Advair diskus [fluticasone-salmeterol]; Codeine; Erythromycin; Morphine and related; Nsaids; and Sulfa antibiotics  Family History  Problem Relation Age of Onset  . Breast cancer Maternal Grandmother   . Stroke Maternal Grandmother   .  Bladder Cancer Father   . Prostate cancer Father   . Heart attack Father   . Aortic aneurysm Father   . Congestive Heart Failure Father   . Colon cancer Paternal Grandmother   . Aneurysm Paternal Grandmother   . Stroke Paternal Grandmother   . Congestive Heart Failure Paternal Grandmother   . Dementia Mother   . Stroke Mother   . Dementia Maternal Grandfather   . Stroke Maternal Grandfather   . Dementia Paternal Grandfather   . Stroke Paternal Grandfather     Social History   Socioeconomic History  . Marital status: Married    Spouse name: Not on file  . Number of children: Not on file  . Years of education: Not on file  . Highest education level: Not on file  Occupational History  . Not on file  Social Needs  . Financial resource strain: Not on file  . Food insecurity:    Worry: Not on file    Inability: Not on file  . Transportation needs:    Medical: Not on file    Non-medical: Not on file  Tobacco Use  . Smoking status: Former Research scientist (life sciences)  . Smokeless tobacco: Never Used  Substance and Sexual Activity  . Alcohol use: No  . Drug use: No  . Sexual activity: Not Currently    Partners: Male    Birth control/protection: Post-menopausal  Lifestyle  . Physical activity:    Days per week: Not on file    Minutes per session: Not on file  . Stress: Not on file  Relationships  . Social connections:    Talks on phone: Not on file    Gets together: Not on file     Attends religious service: Not on file    Active member of club or organization: Not on file    Attends meetings of clubs or organizations: Not on file    Relationship status: Not on file  . Intimate partner violence:    Fear of current or ex partner: Not on file    Emotionally abused: Not on file    Physically abused: Not on file    Forced sexual activity: Not on file  Other Topics Concern  . Not on file  Social History Narrative  . Not on file    Review of Systems  All other systems reviewed and are negative.   PHYSICAL EXAMINATION:    BP 130/80   Pulse 76   Resp 16   Ht 5' 0.75" (1.543 m)   Wt 136 lb (61.7 kg)   BMI 25.91 kg/m     General appearance: alert, cooperative and appears stated age Abdomen: soft, mildly tender in the left lower quadrant in the area of the descending colon; non distended, no masses,  no organomegaly  Pelvic: External genitalia:  no lesions              Urethra:  normal appearing urethra with no masses, tenderness or lesions              Bartholins and Skenes: normal                 Vagina: pessary removed and cleaned. Area of ulceration in the posterior vaginal apex. Not bleeding. Pessary not remplaced              Cervix:  no lesions  Chaperone was present for exam.  ASSESSMENT Genital prolapse, well controlled with the pessary, but recurrent vaginal irritation. This is a new  pessary in the last ~month. She had constipation for the first few weeks Abdominal pain, area of descending colon    PLAN Pessary left out Use vaginal estrogen nightly for 1 week, then every other day for one week, then 2 x a week F/u in the next few weeks for possible reinsertion (will depend on how she is feeling and the coronavirus outbreak) She should f/u with her primary for her abdominal pain, discussed the location can go along with diverticulitis.    An After Visit Summary was printed and given to the patient.  ~15 minutes face to face time of which  over 50% was spent in counseling.

## 2019-02-14 ENCOUNTER — Other Ambulatory Visit: Payer: Self-pay

## 2019-02-14 ENCOUNTER — Other Ambulatory Visit: Payer: Self-pay | Admitting: Internal Medicine

## 2019-02-14 MED ORDER — FENOFIBRATE 160 MG PO TABS: 160.0000 mg | ORAL_TABLET | Freq: Every day | ORAL | 1 refills | Status: DC

## 2019-02-15 ENCOUNTER — Other Ambulatory Visit: Payer: Self-pay | Admitting: Nurse Practitioner

## 2019-02-15 MED ORDER — FENOFIBRATE 145 MG PO TABS: 145.0000 mg | ORAL_TABLET | Freq: Every day | ORAL | 5 refills | Status: DC

## 2019-02-16 ENCOUNTER — Other Ambulatory Visit: Payer: Self-pay | Admitting: Internal Medicine

## 2019-02-16 DIAGNOSIS — L719 Rosacea, unspecified: Secondary | ICD-10-CM

## 2019-02-19 ENCOUNTER — Other Ambulatory Visit: Payer: Self-pay

## 2019-02-19 MED ORDER — AMLODIPINE BESYLATE 5 MG PO TABS: 5.0000 mg | ORAL_TABLET | Freq: Every day | ORAL | 1 refills | Status: DC

## 2019-02-19 MED ORDER — FLUOXETINE HCL 40 MG PO CAPS: ORAL_CAPSULE | ORAL | 1 refills | Status: DC

## 2019-03-27 ENCOUNTER — Ambulatory Visit: Payer: Self-pay | Admitting: Internal Medicine

## 2019-04-08 ENCOUNTER — Ambulatory Visit: Payer: Self-pay | Admitting: Internal Medicine

## 2019-04-10 ENCOUNTER — Other Ambulatory Visit: Payer: Self-pay | Admitting: Adult Health

## 2019-04-11 ENCOUNTER — Other Ambulatory Visit: Payer: Self-pay

## 2019-04-11 ENCOUNTER — Ambulatory Visit (INDEPENDENT_AMBULATORY_CARE_PROVIDER_SITE_OTHER): Payer: Medicare Other | Admitting: Nurse Practitioner

## 2019-04-11 ENCOUNTER — Encounter: Payer: Self-pay | Admitting: Nurse Practitioner

## 2019-04-11 VITALS — BP 125/84 | HR 72 | Temp 97.6°F | Resp 16 | Ht 61.0 in | Wt 137.1 lb

## 2019-04-11 DIAGNOSIS — G8929 Other chronic pain: Secondary | ICD-10-CM

## 2019-04-11 DIAGNOSIS — J4540 Moderate persistent asthma, uncomplicated: Secondary | ICD-10-CM

## 2019-04-11 DIAGNOSIS — F411 Generalized anxiety disorder: Secondary | ICD-10-CM | POA: Diagnosis not present

## 2019-04-11 DIAGNOSIS — M545 Low back pain: Secondary | ICD-10-CM | POA: Diagnosis not present

## 2019-04-11 DIAGNOSIS — J301 Allergic rhinitis due to pollen: Secondary | ICD-10-CM | POA: Diagnosis not present

## 2019-04-11 DIAGNOSIS — I10 Essential (primary) hypertension: Secondary | ICD-10-CM

## 2019-04-11 MED ORDER — CYCLOBENZAPRINE HCL 5 MG PO TABS: 5.0000 mg | ORAL_TABLET | Freq: Two times a day (BID) | ORAL | 2 refills | Status: DC | PRN

## 2019-04-11 MED ORDER — HYDROCODONE-ACETAMINOPHEN 5-300 MG PO TABS: 1.0000 | ORAL_TABLET | Freq: Two times a day (BID) | ORAL | 0 refills | Status: DC | PRN

## 2019-04-11 MED ORDER — ALPRAZOLAM 0.25 MG PO TABS: 0.2500 mg | ORAL_TABLET | Freq: Two times a day (BID) | ORAL | 2 refills | Status: DC | PRN

## 2019-04-11 MED ORDER — ALBUTEROL SULFATE HFA 108 (90 BASE) MCG/ACT IN AERS: INHALATION_SPRAY | RESPIRATORY_TRACT | 5 refills | Status: DC

## 2019-04-11 NOTE — Progress Notes (Signed)
[facility] [place], [place] [address]  Internal MEDICINE  Telephone Visit  Patient Name: Bonnie West  [id number]  [id number]  Date of Service: 04/27/2019  I connected with the patient at 2:21pm by telephone and verified the patients identity using two identifiers.   I discussed the limitations, risks, security and privacy concerns of performing an evaluation and management service by telephone and the availability of in person appointments. I also discussed with the patient that there may be a patient responsible charge related to the service.  The patient expressed understanding and agrees to proceed.    Chief Complaint  Patient presents with  . Telephone Assessment  . Telephone Screen  . Hyperlipidemia  . Hypertension  . Hypothyroidism    The patient has been contacted via telephone for follow up visit due to concerns for spread of novel coronavirus.   Asthma  She complains of cough and wheezing. There is no shortness of breath. Primary symptoms comments: She is feeling better afterher severe asthma flare up. This is a chronic problem. The current episode started more than 1 year ago. The problem occurs every several days. The problem has been unchanged. Associated symptoms include dyspnea on exertion and malaise/fatigue. Pertinent negatives include no chest pain, headaches, postnasal drip, rhinorrhea, sneezing or sore throat. Her symptoms are aggravated by exposure to fumes and exposure to smoke. Her symptoms are alleviated by beta-agonist, steroid inhaler, leukotriene antagonist and ipratropium. She reports moderate improvement on treatment. There are no known risk factors for lung disease. Her past medical history is significant for asthma.  Medication Refill  This is a chronic (pt needs her vicodin for pain control,. she only itakes it occasionally. ) problem. The current episode started more than 1 year ago. The problem occurs rarely. The problem has been  unchanged. Associated symptoms include arthralgias, coughing, fatigue and joint swelling. Pertinent negatives include no abdominal pain, chest pain, chills, congestion, headaches, nausea, neck pain, numbness, rash, sore throat or vomiting. The symptoms are aggravated by exertion. She has tried acetaminophen, position changes, relaxation, oral narcotics and rest for the symptoms. The treatment provided moderate relief.       Current Medication: Outpatient Encounter Medications as of 04/11/2019  Medication Sig  . acetaminophen (TYLENOL) 500 MG tablet Take 1,000 mg by mouth 2 (two) times daily as needed for mild pain or moderate pain.  Marland Kitchen albuterol (VENTOLIN HFA) 108 (90 Base) MCG/ACT inhaler INHALE 2 PUFFS BY MOUTH EVERY 6 HOURS  . ALPRAZolam (XANAX) 0.25 MG tablet Take 1 tablet (0.25 mg total) by mouth 2 (two) times daily as needed for anxiety.  Marland Kitchen amLODipine (NORVASC) 5 MG tablet Take 1 tablet (5 mg total) by mouth daily.  . ATROVENT HFA 17 MCG/ACT inhaler INHALE 2 PUFFS 4 TIMES DAILY  . betamethasone valerate ointment (VALISONE) 0.1 % Apply 1 application topically 2 (two) times daily. Use a small amount  . bisacodyl (DULCOLAX) 5 MG EC tablet Take 5-10 mg by mouth at bedtime as needed for moderate constipation. Two at night  . cetirizine (ZYRTEC) 10 MG tablet Take 10 mg by mouth daily.  . cholecalciferol (VITAMIN D) 1000 units tablet Take 1,000 Units by mouth at bedtime.  . clindamycin (CLEOCIN T) 1 % external solution APPLY 1 APPLICATION TOPICALLY 2 (TWO) TIMES DAILY AS NEEDED. FOR SKIN BREAKDOWN  . cyclobenzaprine (FLEXERIL) 5 MG tablet Take 1-2 tablets (5-10 mg total) by mouth 2 (two) times daily as needed for muscle spasms.  . diclofenac sodium (  VOLTAREN) 1 % GEL APPLY 2 GRAMS TO AFFECTED AREA 4 TIMES A DAY  . docusate sodium (COLACE) 100 MG capsule Take 100-200 mg by mouth at bedtime as needed for mild constipation or moderate constipation.   Marland Kitchen estradiol (ESTRACE) 0.1 MG/GM vaginal cream  INSERT 1 GRAM VAGINALLY TWICE WEEKLY AT BEDTIME  . fenofibrate (TRICOR) 145 MG tablet Take 1 tablet (145 mg total) by mouth daily.  Marland Kitchen FLOVENT HFA 110 MCG/ACT inhaler TAKE 1 PUFF BY MOUTH TWICE A DAY  . FLUoxetine (PROZAC) 40 MG capsule TAKE 1 CAPSULE BY MOUTH EVERY DAY  . HYDROcodone-Acetaminophen 5-300 MG TABS Take 1 tablet by mouth 2 (two) times daily as needed. For severe pain  . ipratropium (ATROVENT) 0.02 % nebulizer solution Inhale into the lungs.  . isometheptene-acetaminophen-dichloralphenazone (MIDRIN) 65-100-325 MG capsule Take 1 capsule by mouth 4 (four) times daily as needed for migraine. Maximum 5 capsules in 12 hours for migraine headaches, 8 capsules in 24 hours for tension headaches.  . levothyroxine (SYNTHROID, LEVOTHROID) 100 MCG tablet TAKE 1 TABLET BY MOUTH EVERY DAY IN THE MORNING  . lisinopril (PRINIVIL,ZESTRIL) 10 MG tablet TAKE 1 TABLET BY MOUTH EVERY DAY  . magnesium oxide (MAG-OX) 400 MG tablet Take 400 mg by mouth at bedtime.   . montelukast (SINGULAIR) 10 MG tablet TAKE 1 TABLET BY MOUTH EVERY DAY  . omeprazole (PRILOSEC) 40 MG capsule TAKE ONE CAPSULE BY MOUTH DAILY  . triamcinolone cream (KENALOG) 0.1 % Apply 1 application topically 2 (two) times daily as needed. For skin breakdown  . vitamin B-12 (CYANOCOBALAMIN) 1000 MCG tablet Take 1,000 mcg by mouth daily.  . [DISCONTINUED] albuterol (PROVENTIL HFA;VENTOLIN HFA) 108 (90 Base) MCG/ACT inhaler INHALE 2 PUFFS BY MOUTH EVERY 6 HOURS  . [DISCONTINUED] ALPRAZolam (XANAX) 0.25 MG tablet Take 1 tablet (0.25 mg total) by mouth 2 (two) times daily as needed for anxiety.  . [DISCONTINUED] cyclobenzaprine (FLEXERIL) 5 MG tablet Take 1-2 tablets (5-10 mg total) by mouth 2 (two) times daily as needed for muscle spasms.  . [DISCONTINUED] HYDROcodone-Acetaminophen 5-300 MG TABS Take 1 tablet by mouth 2 (two) times daily as needed. For severe pain   No facility-administered encounter medications on file as of 04/11/2019.      Surgical History: Past Surgical History:  Procedure Laterality Date  . BUNIONECTOMY    . CATARACT EXTRACTION W/PHACO Right 10/11/2016   Procedure: CATARACT EXTRACTION PHACO AND INTRAOCULAR LENS PLACEMENT (IOC);  Surgeon: Birder Robson, MD;  Location: [facility] ORS;  Service: Ophthalmology;  Laterality: Right;  Lot# [id number] H [place]: 00:37.7 AP%: 18.1 CDE: 6.80  . CATARACT EXTRACTION W/PHACO Left 11/08/2016   Procedure: CATARACT EXTRACTION PHACO AND INTRAOCULAR LENS PLACEMENT (IOC);  Surgeon: Birder Robson, MD;  Location: [facility] ORS;  Service: Ophthalmology;  Laterality: Left;  PACK LOT: [id number] H US:00:32 AP:44 CDE:6.46  . COLONOSCOPY    . COLONOSCOPY WITH PROPOFOL N/A 11/26/2015   Procedure: COLONOSCOPY WITH PROPOFOL;  Surgeon: Manya Silvas, MD;  Location[facility] ENDOSCOPY;  Service: Endoscopy;  Laterality: N/A;  . EYE SURGERY    . TONSILLECTOMY      Medical History: Past Medical History:  Diagnosis Date  . Allergic rhinitis   . Anxiety   . Asthma   . Blood transfusion without reported diagnosis   . Depression   . Diverticulosis   . Dyspnea   . Esophagitis   . GERD (gastroesophageal reflux disease)   . Headache   . Heart murmur   . Hyperlipemia   . Hypertension   . Hypothyroidism   .  Obesity   . Osteopenia   . Palpitations   . Pneumothorax   . Sleep apnea     Family History: Family History  Problem Relation Age of Onset  . Breast cancer Maternal Grandmother   . Stroke Maternal Grandmother   . Bladder Cancer Father   . Prostate cancer Father   . Heart attack Father   . Aortic aneurysm Father   . Congestive Heart Failure Father   . Colon cancer Paternal Grandmother   . Aneurysm Paternal Grandmother   . Stroke Paternal Grandmother   . Congestive Heart Failure Paternal Grandmother   . Dementia Mother   . Stroke Mother   . Dementia Maternal Grandfather   . Stroke Maternal Grandfather   . Dementia Paternal Grandfather   . Stroke Paternal Grandfather      Social History   Socioeconomic History  . Marital status: Married    Spouse name: Not on file  . Number of children: Not on file  . Years of education: Not on file  . Highest education level: Not on file  Occupational History  . Not on file  Social Needs  . Financial resource strain: Not on file  . Food insecurity:    Worry: Not on file    Inability: Not on file  . Transportation needs:    Medical: Not on file    Non-medical: Not on file  Tobacco Use  . Smoking status: Former Research scientist (life sciences)  . Smokeless tobacco: Never Used  Substance and Sexual Activity  . Alcohol use: No  . Drug use: No  . Sexual activity: Not Currently    Partners: Male    Birth control/protection: Post-menopausal  Lifestyle  . Physical activity:    Days per week: Not on file    Minutes per session: Not on file  . Stress: Not on file  Relationships  . Social connections:    Talks on phone: Not on file    Gets together: Not on file    Attends religious service: Not on file    Active member of club or organization: Not on file    Attends meetings of clubs or organizations: Not on file    Relationship status: Not on file  . Intimate partner violence:    Fear of current or ex partner: Not on file    Emotionally abused: Not on file    Physically abused: Not on file    Forced sexual activity: Not on file  Other Topics Concern  . Not on file  Social History Narrative  . Not on file      Review of Systems  Constitutional: Positive for fatigue and malaise/fatigue. Negative for activity change, chills and unexpected weight change.  HENT: Negative for congestion, postnasal drip, rhinorrhea, sneezing, sore throat and voice change.   Eyes: Negative.  Negative for redness.  Respiratory: Positive for cough and wheezing. Negative for chest tightness and shortness of breath.        Intermittent  Cardiovascular: Positive for dyspnea on exertion. Negative for chest pain and palpitations.  Gastrointestinal:  Negative for abdominal pain, constipation, diarrhea, nausea and vomiting.  Endocrine: Negative for cold intolerance, heat intolerance, polydipsia, polyphagia and polyuria.  Genitourinary: Negative.  Negative for dysuria and frequency.  Musculoskeletal: Positive for arthralgias and joint swelling. Negative for back pain and neck pain.  Skin: Negative for rash.  Allergic/Immunologic: Positive for environmental allergies.  Neurological: Negative for dizziness, tremors, numbness and headaches.  Hematological: Negative for adenopathy. Does not bruise/bleed easily.  Psychiatric/Behavioral: Positive for dysphoric mood. Negative for behavioral problems (Depression), sleep disturbance and suicidal ideas. The patient is nervous/anxious.     Today's Vitals   04/11/19 1453  BP: 125/84  Pulse: 72  Resp: 16  Temp: 97.6 F (36.4 C)  Weight: 137 lb 1.6 oz (62.2 kg)  Height: 5\' 1"  (1.549 m)   Body mass index is 25.9 kg/m.  Observation/Objective:   The patient is alert and oriented. She is pleasant and answers all questions appropriately. Breathing is non-labored. She is in no acute distress at this time.    Assessment/Plan: 1. Moderate persistent asthma without status asthmaticus without complication Stable. Continue inhalers and respiratory medications as prescribed. Refilled rescue inhaler today.  - albuterol (VENTOLIN HFA) 108 (90 Base) MCG/ACT inhaler; INHALE 2 PUFFS BY MOUTH EVERY 6 HOURS  Dispense: 18 g; Refill: 5  2. Allergic rhinitis due to pollen, unspecified seasonality Continue all allergy medication as prescribed   3. Essential hypertension, benign Stable.   4. GAD (generalized anxiety disorder) May continue alprazolam 0.25mg  twice daily if needed for acute anxiety. New prescription sent to her pharmacy today.  - ALPRAZolam (XANAX) 0.25 MG tablet; Take 1 tablet (0.25 mg total) by mouth 2 (two) times daily as needed for anxiety.  Dispense: 60 tablet; Refill: 2  5. Chronic  midline low back pain without sciatica Renew hydrocodone/APAP 5/300mg  tablets which may be taken up to twice daily if needed for severe pain. Reviewed risks and possible side effects associated with taking opiates, benzodiazepines and other CNS depressants. Combination of these could cause dizziness and drowsiness. Advised patient not to drive or operate machinery when taking these medications, as patient's and other's life can be at risk and will have consequences. Patient verbalized understanding in this matter. Dependence and abuse for these drugs will be monitored closely. A Controlled substance policy and procedure is on file which allows [facility] to order a urine drug screen test at any visit. Patient understands and agrees with the plan - cyclobenzaprine (FLEXERIL) 5 MG tablet; Take 1-2 tablets (5-10 mg total) by mouth 2 (two) times daily as needed for muscle spasms.  Dispense: 120 tablet; Refill: 2 - HYDROcodone-Acetaminophen 5-300 MG TABS; Take 1 tablet by mouth 2 (two) times daily as needed. For severe pain  Dispense: 15 each; Refill: 0  General Counseling: Sandy Salaam understanding of the findings of today's phone visit and agrees with plan of treatment. I have discussed any further diagnostic evaluation that may be needed or ordered today. We also reviewed her medications today. she has been encouraged to call the office with any questions or concerns that should arise related to todays visit.  This patient was seen by [place] with Dr Lavera Guise as a part of collaborative care agreement  Meds ordered this encounter  Medications  . ALPRAZolam (XANAX) 0.25 MG tablet    Sig: Take 1 tablet (0.25 mg total) by mouth 2 (two) times daily as needed for anxiety.    Dispense:  60 tablet    Refill:  2    Order Specific Question:   Supervising Provider    Answer:   Lavera Guise [[id number]]  . albuterol (VENTOLIN HFA) 108 (90 Base) MCG/ACT inhaler    Sig:  INHALE 2 PUFFS BY MOUTH EVERY 6 HOURS    Dispense:  18 g    Refill:  5    Order Specific Question:   Supervising Provider    Answer:   Lavera Guise [  [id number]]  . cyclobenzaprine (FLEXERIL) 5 MG tablet    Sig: Take 1-2 tablets (5-10 mg total) by mouth 2 (two) times daily as needed for muscle spasms.    Dispense:  120 tablet    Refill:  2    Order Specific Question:   Supervising Provider    Answer:   Lavera Guise [[id number]]  . HYDROcodone-Acetaminophen 5-300 MG TABS    Sig: Take 1 tablet by mouth 2 (two) times daily as needed. For severe pain    Dispense:  15 each    Refill:  0    Order Specific Question:   Supervising Provider    Answer:   Lavera Guise [[id number]]    Time spent: 62 Minutes    Dr Lavera Guise Internal medicine

## 2019-04-27 DIAGNOSIS — J301 Allergic rhinitis due to pollen: Secondary | ICD-10-CM | POA: Insufficient documentation

## 2019-04-27 DIAGNOSIS — G8929 Other chronic pain: Secondary | ICD-10-CM | POA: Insufficient documentation

## 2019-04-27 DIAGNOSIS — I10 Essential (primary) hypertension: Secondary | ICD-10-CM | POA: Insufficient documentation

## 2019-04-27 DIAGNOSIS — M545 Low back pain, unspecified: Secondary | ICD-10-CM | POA: Insufficient documentation

## 2019-05-20 NOTE — Progress Notes (Signed)
GYNECOLOGY  VISIT   HPI: 68 y.o.   Married White or Caucasian Not Hispanic or Latino  female   G1P1001 with No LMP recorded. Patient is postmenopausal.   here for follow up of vaginal irritation from her pessary. Her prolapse (cystocele and uterine prolapse) has been well controlled with the pessary but she has had issues with recurrent vaginal irritation. She was fitted with a new pessary, #5 with support after her prior pessary was slipping.  She was having issues with diarrhea and constipation after being fitted with her new pessary and was noted to have vaginal ulceration at her last visit 3 months ago. The pessary was left out. She hasn't returned sooner than now secondary to the coronavirus.  She is c/o the prolapse coming out, lower back pain with the pessary out. She feels the prolapse is getting worse. She is using her vaginal estrogen 2 x a week. Voiding normally, not leaking. BM are still going back and forth from loose to hard. She is using stool softeners and intermittent laxatives. Last colonoscopy 2 years ago.   She has gained ~10 lbs since her last visit and is concerned if the pessary will fit.   GYNECOLOGIC HISTORY: No LMP recorded. Patient is postmenopausal. Contraception:Postmenopausal Menopausal hormone therapy: Estradiol vaginal cream        OB History    Gravida  1   Para  1   Term  1   Preterm      AB      Living  1     SAB      TAB      Ectopic      Multiple      Live Births  1              Patient Active Problem List   Diagnosis Date Noted  . Chronic midline low back pain without sciatica 04/27/2019  . Allergic rhinitis due to pollen 04/27/2019  . Essential hypertension, benign 04/27/2019  . GAD (generalized anxiety disorder) 09/01/2018  . Chronic migraine without aura without status migrainosus, not intractable 09/01/2018  . Encounter for long-term (current) use of medications 09/01/2018  . Asthma without status asthmaticus 01/30/2018   . Hyperlipidemia, unspecified 01/30/2018  . Hypertension 01/30/2018  . Obesity, unspecified 01/30/2018  . Hypothyroidism 01/30/2018  . Acute respiratory failure with hypoxia ([place]) 12/31/2017  . Fatty infiltration of liver 12/10/2015  . Abdominal pain, LLQ (left lower quadrant) 11/03/2015  . History of adenomatous polyp of colon 11/03/2015  . Hallux valgus, left 09/18/2014  . 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  . Shoulder arthritis 12/19/2013    Past Medical History:  Diagnosis Date  . Allergic rhinitis   . Anxiety   . Asthma   . Blood transfusion without reported diagnosis   . Depression   . Diverticulosis   . Dyspnea   . Esophagitis   . GERD (gastroesophageal reflux disease)   . Headache   . Heart murmur   . Hyperlipemia   . Hypertension   . Hypothyroidism   . Obesity   . Osteopenia   . Palpitations   . Pneumothorax   . Sleep apnea     Past Surgical History:  Procedure Laterality Date  . BUNIONECTOMY    . CATARACT EXTRACTION W/PHACO Right 10/11/2016   Procedure: CATARACT EXTRACTION PHACO AND INTRAOCULAR LENS PLACEMENT (IOC);  Surgeon: Birder Robson, MD;  Location: [facility] ORS;  Service: Ophthalmology;  Laterality: Right;  Lot# [id number] H [place]: 00:37.7 AP%: 18.1 CDE: 6.80  .  CATARACT EXTRACTION W/PHACO Left 11/08/2016   Procedure: CATARACT EXTRACTION PHACO AND INTRAOCULAR LENS PLACEMENT (IOC);  Surgeon: Birder Robson, MD;  Location: [facility] ORS;  Service: Ophthalmology;  Laterality: Left;  PACK LOT: [id number] H US:00:32 AP:44 CDE:6.46  . COLONOSCOPY    . COLONOSCOPY WITH PROPOFOL N/A 11/26/2015   Procedure: COLONOSCOPY WITH PROPOFOL;  Surgeon: Manya Silvas, MD;  Location: [facility] ENDOSCOPY;  Service: Endoscopy;  Laterality: N/A;  . EYE SURGERY    . TONSILLECTOMY      Current Outpatient Medications  Medication Sig Dispense Refill  . acetaminophen (TYLENOL) 500 MG tablet Take 1,000 mg by mouth 2 (two) times daily as needed for mild pain or moderate pain.    Marland Kitchen  albuterol (VENTOLIN HFA) 108 (90 Base) MCG/ACT inhaler INHALE 2 PUFFS BY MOUTH EVERY 6 HOURS 18 g 5  . ALPRAZolam (XANAX) 0.25 MG tablet Take 1 tablet (0.25 mg total) by mouth 2 (two) times daily as needed for anxiety. 60 tablet 2  . amLODipine (NORVASC) 5 MG tablet Take 1 tablet (5 mg total) by mouth daily. 90 tablet 1  . ATROVENT HFA 17 MCG/ACT inhaler INHALE 2 PUFFS 4 TIMES DAILY 12.9 Inhaler 5  . bisacodyl (DULCOLAX) 5 MG EC tablet Take 5-10 mg by mouth at bedtime as needed for moderate constipation. Two at night    . cetirizine (ZYRTEC) 10 MG tablet Take 10 mg by mouth daily.    . cholecalciferol (VITAMIN D) 1000 units tablet Take 1,000 Units by mouth at bedtime.    . clindamycin (CLEOCIN T) 1 % external solution APPLY 1 APPLICATION TOPICALLY 2 (TWO) TIMES DAILY AS NEEDED. FOR SKIN BREAKDOWN 30 mL 2  . cyclobenzaprine (FLEXERIL) 5 MG tablet Take 1-2 tablets (5-10 mg total) by mouth 2 (two) times daily as needed for muscle spasms. 120 tablet 2  . docusate sodium (COLACE) 100 MG capsule Take 100-200 mg by mouth at bedtime as needed for mild constipation or moderate constipation.     Marland Kitchen estradiol (ESTRACE) 0.1 MG/GM vaginal cream INSERT 1 GRAM VAGINALLY TWICE WEEKLY AT BEDTIME 42.5 g 1  . fenofibrate (TRICOR) 145 MG tablet Take 1 tablet (145 mg total) by mouth daily. 30 tablet 5  . FLOVENT HFA 110 MCG/ACT inhaler TAKE 1 PUFF BY MOUTH TWICE A DAY 12 Inhaler 3  . FLUoxetine (PROZAC) 40 MG capsule TAKE 1 CAPSULE BY MOUTH EVERY DAY 90 capsule 1  . HYDROcodone-Acetaminophen 5-300 MG TABS Take 1 tablet by mouth 2 (two) times daily as needed. For severe pain 15 each 0  . ipratropium (ATROVENT) 0.02 % nebulizer solution Inhale into the lungs.    . isometheptene-acetaminophen-dichloralphenazone (MIDRIN) 65-100-325 MG capsule Take 1 capsule by mouth 4 (four) times daily as needed for migraine. Maximum 5 capsules in 12 hours for migraine headaches, 8 capsules in 24 hours for tension headaches.    .  levothyroxine (SYNTHROID, LEVOTHROID) 100 MCG tablet TAKE 1 TABLET BY MOUTH EVERY DAY IN THE MORNING 90 tablet 3  . lisinopril (PRINIVIL,ZESTRIL) 10 MG tablet TAKE 1 TABLET BY MOUTH EVERY DAY 90 tablet 1  . magnesium oxide (MAG-OX) 400 MG tablet Take 400 mg by mouth at bedtime.     . montelukast (SINGULAIR) 10 MG tablet TAKE 1 TABLET BY MOUTH EVERY DAY 90 tablet 1  . omeprazole (PRILOSEC) 40 MG capsule TAKE ONE CAPSULE BY MOUTH DAILY 90 capsule 3  . vitamin B-12 (CYANOCOBALAMIN) 1000 MCG tablet Take 1,000 mcg by mouth daily.    Marland Kitchen triamcinolone cream (KENALOG) 0.1 %  Apply 1 application topically 2 (two) times daily as needed. For skin breakdown     No current facility-administered medications for this visit.      ALLERGIES: Advair diskus [fluticasone-salmeterol], Codeine, Erythromycin, Morphine and related, Nsaids, and Sulfa antibiotics  Family History  Problem Relation Age of Onset  . Breast cancer Maternal Grandmother   . Stroke Maternal Grandmother   . Bladder Cancer Father   . Prostate cancer Father   . Heart attack Father   . Aortic aneurysm Father   . Congestive Heart Failure Father   . Colon cancer Paternal Grandmother   . Aneurysm Paternal Grandmother   . Stroke Paternal Grandmother   . Congestive Heart Failure Paternal Grandmother   . Dementia Mother   . Stroke Mother   . Dementia Maternal Grandfather   . Stroke Maternal Grandfather   . Dementia Paternal Grandfather   . Stroke Paternal Grandfather     Social History   Socioeconomic History  . Marital status: Married    Spouse name: Not on file  . Number of children: Not on file  . Years of education: Not on file  . Highest education level: Not on file  Occupational History  . Not on file  Social Needs  . Financial resource strain: Not on file  . Food insecurity    Worry: Not on file    Inability: Not on file  . Transportation needs    Medical: Not on file    Non-medical: Not on file  Tobacco Use  . Smoking  status: Former Research scientist (life sciences)  . Smokeless tobacco: Never Used  Substance and Sexual Activity  . Alcohol use: No  . Drug use: No  . Sexual activity: Not Currently    Partners: Male    Birth control/protection: Post-menopausal  Lifestyle  . Physical activity    Days per week: Not on file    Minutes per session: Not on file  . Stress: Not on file  Relationships  . Social Herbalist on phone: Not on file    Gets together: Not on file    Attends religious service: Not on file    Active member of club or organization: Not on file    Attends meetings of clubs or organizations: Not on file    Relationship status: Not on file  . Intimate partner violence    Fear of current or ex partner: Not on file    Emotionally abused: Not on file    Physically abused: Not on file    Forced sexual activity: Not on file  Other Topics Concern  . Not on file  Social History Narrative  . Not on file    Review of Systems  Constitutional: Negative.   HENT: Negative.   Eyes: Negative.   Respiratory: Negative.   Cardiovascular: Negative.   Gastrointestinal: Negative.   Genitourinary: Negative.   Musculoskeletal: Negative.   Skin: Negative.   Neurological: Negative.   Endo/Heme/Allergies: Negative.   Psychiatric/Behavioral: Negative.     PHYSICAL EXAMINATION:    BP 122/84 (BP Location: Right Arm, Patient Position: Sitting, Cuff Size: Normal)   Pulse 72   Temp 98.3 F (36.8 C) (Skin)   Wt 144 lb 6.4 oz (65.5 kg)   BMI 27.28 kg/m     General appearance: alert, cooperative and appears stated age  Pelvic: External genitalia:  no lesions              Urethra:  normal appearing urethra with no masses, tenderness  or lesions              Bartholins and Skenes: normal                 Vagina: normal appearing vagina with normal color and discharge, no lesions              Cervix: no lesions              Grade 2 uterine prolapse (out almost 2 cm), with valsalva (only examined supine). Grade 1  cystocele. #5 ring pessary was replaced, uncomfortable. Fitted with a #4 ring pessary with support  Chaperone was present for exam.  ASSESSMENT Uterine prolapse Cystocele Bowel changes, can be normal or go from diarrhea to constipation. She adjusts her medication.     PLAN Her #5 ring pessary was replaced, uncomfortable Fitted with a #4 ring with support, estrogen cream placed Recommended she f/u with primary or GI to discuss her GI issues.   An After Visit Summary was printed and given to the patient.  In addition to the pessary fitting, ~15 minutes face to face time of which over 50% was spent in counseling.

## 2019-05-21 ENCOUNTER — Other Ambulatory Visit: Payer: Self-pay

## 2019-05-21 ENCOUNTER — Telehealth: Payer: Self-pay | Admitting: Obstetrics and Gynecology

## 2019-05-21 ENCOUNTER — Encounter: Payer: Self-pay | Admitting: Obstetrics and Gynecology

## 2019-05-21 ENCOUNTER — Ambulatory Visit (INDEPENDENT_AMBULATORY_CARE_PROVIDER_SITE_OTHER): Payer: Medicare Other | Admitting: Obstetrics and Gynecology

## 2019-05-21 VITALS — BP 122/84 | HR 72 | Temp 98.3°F | Wt 144.4 lb

## 2019-05-21 DIAGNOSIS — N814 Uterovaginal prolapse, unspecified: Secondary | ICD-10-CM

## 2019-05-21 DIAGNOSIS — N8111 Cystocele, midline: Secondary | ICD-10-CM

## 2019-05-21 DIAGNOSIS — Z4689 Encounter for fitting and adjustment of other specified devices: Secondary | ICD-10-CM

## 2019-05-21 MED ORDER — ESTRADIOL 0.1 MG/GM VA CREA: TOPICAL_CREAM | VAGINAL | 1 refills | Status: DC

## 2019-05-21 NOTE — Telephone Encounter (Signed)
Call returned to patient, number busy, no alternative number.

## 2019-05-21 NOTE — Telephone Encounter (Signed)
Left message to call Sharee Pimple, RN at [facility].   Please offer OV for 7/6 or 7/9 at 4:30pm with Dr. Talbert Nan

## 2019-05-21 NOTE — Telephone Encounter (Signed)
Patient returned call

## 2019-05-21 NOTE — Telephone Encounter (Signed)
Patient to return in one week for pessary check. No available appointments for the next few weeks.

## 2019-05-21 NOTE — Telephone Encounter (Signed)
Spoke with patient. OV scheduled for 7/9 at 4:30pm with Dr. Talbert Nan for pessary recheck. Patient verbalizes understanding and is agreeable.   Encounter closed.

## 2019-05-22 ENCOUNTER — Telehealth: Payer: Self-pay | Admitting: Obstetrics and Gynecology

## 2019-05-22 ENCOUNTER — Ambulatory Visit (INDEPENDENT_AMBULATORY_CARE_PROVIDER_SITE_OTHER): Payer: Medicare Other | Admitting: Obstetrics and Gynecology

## 2019-05-22 ENCOUNTER — Encounter: Payer: Self-pay | Admitting: Obstetrics and Gynecology

## 2019-05-22 VITALS — BP 122/82 | HR 72 | Temp 98.2°F | Wt 144.0 lb

## 2019-05-22 DIAGNOSIS — R102 Pelvic and perineal pain unspecified side: Secondary | ICD-10-CM

## 2019-05-22 DIAGNOSIS — R32 Unspecified urinary incontinence: Secondary | ICD-10-CM

## 2019-05-22 DIAGNOSIS — Z4689 Encounter for fitting and adjustment of other specified devices: Secondary | ICD-10-CM | POA: Diagnosis not present

## 2019-05-22 NOTE — Progress Notes (Signed)
GYNECOLOGY  VISIT   HPI: 68 y.o.   Married White or Caucasian Not Hispanic or Latino  female   G1P1001 with No LMP recorded. Patient is postmenopausal.   here for pain with pessary. Would like pessary removed and replaced with one of her other sized pessaries.   She was fitted with a #4 ring pessary with support yesterday. It feels like it is scratching her on the inside. It's holding the prolapse up. Feels like it is laying on a nerve. The pessary feels rough. She has been unable to remove it. When she reached in to try and get the pessary she leaked urine. Feels a twinge.   Her brother in law died this morning, was in hospice.   GYNECOLOGIC HISTORY: No LMP recorded. Patient is postmenopausal. Contraception: Postmenopausal Menopausal hormone therapy: Estradiol vaginal cream        OB History    Gravida  1   Para  1   Term  1   Preterm      AB      Living  1     SAB      TAB      Ectopic      Multiple      Live Births  1              Patient Active Problem List   Diagnosis Date Noted  . Chronic midline low back pain without sciatica 04/27/2019  . Allergic rhinitis due to pollen 04/27/2019  . Essential hypertension, benign 04/27/2019  . GAD (generalized anxiety disorder) 09/01/2018  . Chronic migraine without aura without status migrainosus, not intractable 09/01/2018  . Encounter for long-term (current) use of medications 09/01/2018  . Asthma without status asthmaticus 01/30/2018  . Hyperlipidemia, unspecified 01/30/2018  . Hypertension 01/30/2018  . Obesity, unspecified 01/30/2018  . Hypothyroidism 01/30/2018  . Acute respiratory failure with hypoxia ([place]) 12/31/2017  . Fatty infiltration of liver 12/10/2015  . Abdominal pain, LLQ (left lower quadrant) 11/03/2015  . History of adenomatous polyp of colon 11/03/2015  . Hallux valgus, left 09/18/2014  . 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  . Shoulder arthritis 12/19/2013    Past Medical History:   Diagnosis Date  . Allergic rhinitis   . Anxiety   . Asthma   . Blood transfusion without reported diagnosis   . Depression   . Diverticulosis   . Dyspnea   . Esophagitis   . GERD (gastroesophageal reflux disease)   . Headache   . Heart murmur   . Hyperlipemia   . Hypertension   . Hypothyroidism   . Obesity   . Osteopenia   . Palpitations   . Pneumothorax   . Sleep apnea     Past Surgical History:  Procedure Laterality Date  . BUNIONECTOMY    . CATARACT EXTRACTION W/PHACO Right 10/11/2016   Procedure: CATARACT EXTRACTION PHACO AND INTRAOCULAR LENS PLACEMENT (IOC);  Surgeon: Birder Robson, MD;  Location: [facility] ORS;  Service: Ophthalmology;  Laterality: Right;  Lot# [id number] H [place]: 00:37.7 AP%: 18.1 CDE: 6.80  . CATARACT EXTRACTION W/PHACO Left 11/08/2016   Procedure: CATARACT EXTRACTION PHACO AND INTRAOCULAR LENS PLACEMENT (IOC);  Surgeon: Birder Robson, MD;  Location: [facility] ORS;  Service: Ophthalmology;  Laterality: Left;  PACK LOT: [id number] H US:00:32 AP:44 CDE:6.46  . COLONOSCOPY    . COLONOSCOPY WITH PROPOFOL N/A 11/26/2015   Procedure: COLONOSCOPY WITH PROPOFOL;  Surgeon: Manya Silvas, MD;  Location[facility] ENDOSCOPY;  Service: Endoscopy;  Laterality: N/A;  . EYE SURGERY    .  TONSILLECTOMY      Current Outpatient Medications  Medication Sig Dispense Refill  . acetaminophen (TYLENOL) 500 MG tablet Take 1,000 mg by mouth 2 (two) times daily as needed for mild pain or moderate pain.    Marland Kitchen albuterol (VENTOLIN HFA) 108 (90 Base) MCG/ACT inhaler INHALE 2 PUFFS BY MOUTH EVERY 6 HOURS 18 g 5  . ALPRAZolam (XANAX) 0.25 MG tablet Take 1 tablet (0.25 mg total) by mouth 2 (two) times daily as needed for anxiety. 60 tablet 2  . amLODipine (NORVASC) 5 MG tablet Take 1 tablet (5 mg total) by mouth daily. 90 tablet 1  . ATROVENT HFA 17 MCG/ACT inhaler INHALE 2 PUFFS 4 TIMES DAILY 12.9 Inhaler 5  . bisacodyl (DULCOLAX) 5 MG EC tablet Take 5-10 mg by mouth at bedtime as needed for  moderate constipation. Two at night    . cetirizine (ZYRTEC) 10 MG tablet Take 10 mg by mouth daily.    . cholecalciferol (VITAMIN D) 1000 units tablet Take 1,000 Units by mouth at bedtime.    . clindamycin (CLEOCIN T) 1 % external solution APPLY 1 APPLICATION TOPICALLY 2 (TWO) TIMES DAILY AS NEEDED. FOR SKIN BREAKDOWN 30 mL 2  . cyclobenzaprine (FLEXERIL) 5 MG tablet Take 1-2 tablets (5-10 mg total) by mouth 2 (two) times daily as needed for muscle spasms. 120 tablet 2  . docusate sodium (COLACE) 100 MG capsule Take 100-200 mg by mouth at bedtime as needed for mild constipation or moderate constipation.     Marland Kitchen estradiol (ESTRACE) 0.1 MG/GM vaginal cream INSERT 1 GRAM VAGINALLY TWICE WEEKLY AT BEDTIME 42.5 g 1  . fenofibrate (TRICOR) 145 MG tablet Take 1 tablet (145 mg total) by mouth daily. 30 tablet 5  . FLOVENT HFA 110 MCG/ACT inhaler TAKE 1 PUFF BY MOUTH TWICE A DAY 12 Inhaler 3  . FLUoxetine (PROZAC) 40 MG capsule TAKE 1 CAPSULE BY MOUTH EVERY DAY 90 capsule 1  . HYDROcodone-Acetaminophen 5-300 MG TABS Take 1 tablet by mouth 2 (two) times daily as needed. For severe pain 15 each 0  . ipratropium (ATROVENT) 0.02 % nebulizer solution Inhale into the lungs.    . isometheptene-acetaminophen-dichloralphenazone (MIDRIN) 65-100-325 MG capsule Take 1 capsule by mouth 4 (four) times daily as needed for migraine. Maximum 5 capsules in 12 hours for migraine headaches, 8 capsules in 24 hours for tension headaches.    . levothyroxine (SYNTHROID, LEVOTHROID) 100 MCG tablet TAKE 1 TABLET BY MOUTH EVERY DAY IN THE MORNING 90 tablet 3  . lisinopril (PRINIVIL,ZESTRIL) 10 MG tablet TAKE 1 TABLET BY MOUTH EVERY DAY 90 tablet 1  . magnesium oxide (MAG-OX) 400 MG tablet Take 400 mg by mouth at bedtime.     . montelukast (SINGULAIR) 10 MG tablet TAKE 1 TABLET BY MOUTH EVERY DAY 90 tablet 1  . omeprazole (PRILOSEC) 40 MG capsule TAKE ONE CAPSULE BY MOUTH DAILY 90 capsule 3  . triamcinolone cream (KENALOG) 0.1 % Apply  1 application topically 2 (two) times daily as needed. For skin breakdown    . vitamin B-12 (CYANOCOBALAMIN) 1000 MCG tablet Take 1,000 mcg by mouth daily.     No current facility-administered medications for this visit.      ALLERGIES: Advair diskus [fluticasone-salmeterol], Codeine, Erythromycin, Morphine and related, Nsaids, and Sulfa antibiotics  Family History  Problem Relation Age of Onset  . Breast cancer Maternal Grandmother   . Stroke Maternal Grandmother   . Bladder Cancer Father   . Prostate cancer Father   . Heart attack  Father   . Aortic aneurysm Father   . Congestive Heart Failure Father   . Colon cancer Paternal Grandmother   . Aneurysm Paternal Grandmother   . Stroke Paternal Grandmother   . Congestive Heart Failure Paternal Grandmother   . Dementia Mother   . Stroke Mother   . Dementia Maternal Grandfather   . Stroke Maternal Grandfather   . Dementia Paternal Grandfather   . Stroke Paternal Grandfather     Social History   Socioeconomic History  . Marital status: Married    Spouse name: Not on file  . Number of children: Not on file  . Years of education: Not on file  . Highest education level: Not on file  Occupational History  . Not on file  Social Needs  . Financial resource strain: Not on file  . Food insecurity    Worry: Not on file    Inability: Not on file  . Transportation needs    Medical: Not on file    Non-medical: Not on file  Tobacco Use  . Smoking status: Former Research scientist (life sciences)  . Smokeless tobacco: Never Used  Substance and Sexual Activity  . Alcohol use: No  . Drug use: No  . Sexual activity: Not Currently    Partners: Male    Birth control/protection: Post-menopausal  Lifestyle  . Physical activity    Days per week: Not on file    Minutes per session: Not on file  . Stress: Not on file  Relationships  . Social Herbalist on phone: Not on file    Gets together: Not on file    Attends religious service: Not on file     Active member of club or organization: Not on file    Attends meetings of clubs or organizations: Not on file    Relationship status: Not on file  . Intimate partner violence    Fear of current or ex partner: Not on file    Emotionally abused: Not on file    Physically abused: Not on file    Forced sexual activity: Not on file  Other Topics Concern  . Not on file  Social History Narrative  . Not on file    Review of Systems  Constitutional: Negative.   HENT: Negative.   Eyes: Negative.   Respiratory: Negative.   Cardiovascular: Negative.   Gastrointestinal: Negative.   Genitourinary:       Vaginal irritation  Musculoskeletal: Positive for back pain.  Skin: Negative.   Neurological: Negative.   Endo/Heme/Allergies: Negative.   Psychiatric/Behavioral: Negative.     PHYSICAL EXAMINATION:    BP 122/82 (BP Location: Right Arm, Patient Position: Sitting, Cuff Size: Normal)   Pulse 72   Temp 98.2 F (36.8 C) (Skin)   Wt 144 lb (65.3 kg)   BMI 27.21 kg/m     General appearance: alert, cooperative and appears stated age  Pelvic: External genitalia:  no lesions              Urethra:  normal appearing urethra with no masses, tenderness or lesions              Bartholins and Skenes: normal                 Vagina: normal appearing vagina with normal color and discharge, no lesions  The pessary was removed and cleaned. It was in place, didn't feel too big. Her #5 pessary that felt uncomfortable yesterday was replaced. Feels like it fits  well. More comfortable today. She walked around with the pessary, voided with it in. Felt comfortable today.                Chaperone was present for exam.  ASSESSMENT Pelvic pain since placing new pessary yesterday New incontinence and twinge in the bladder region    PLAN #4 ring pessary removed Send urine for ua, c&s #5 pessary replaced She will f/u early next week.    An After Visit Summary was printed and given to the patient.  ~15  minutes face to face time of which over 50% was spent in counseling.

## 2019-05-22 NOTE — Telephone Encounter (Signed)
Spoke with patient. Pessary placed 05/21/19. Patient states she feels like the pessary is sitting on a nerve towards her back causing discomfort and increased agitation. Can feel a rough place on the pessary when she tried to remove it. Has not experienced this with previous pessary. Voiding. Unable to remove the pessary on her own, wants it removed.   Patient placed on brief hold, reviewed schuled with K. Sprague, RN.   Spoke with patient. Advised to come on into office, will work into scheduled. Patient lives approximately 1 hr away. Patient agreeable to plan.   Encounter closed.

## 2019-05-22 NOTE — Telephone Encounter (Signed)
Patient states her pessary is bothering her. Feels as if it is "laying on a nerve". Says she feels a rough spot on the pessary itself.

## 2019-05-22 NOTE — Telephone Encounter (Signed)
Dr. Talbert Nan would like to see Bonnie West back at an earlier time than her 4:30 appointment on 05/30/2019. Patient cannot make it to a 4:30 appointment. No available appointments. If patient does not answer, please leave a voicemail.

## 2019-05-23 LAB — URINALYSIS, MICROSCOPIC ONLY
Bacteria, UA: NONE SEEN
Casts: NONE SEEN /lpf
RBC, Urine: NONE SEEN /hpf (ref 0–2)
WBC, UA: NONE SEEN /hpf (ref 0–5)

## 2019-05-23 LAB — URINE CULTURE

## 2019-05-23 NOTE — Telephone Encounter (Signed)
Per Dr.Jertson moved appointment to 05/27/19 at 12:45pm for pessary recheck.

## 2019-05-27 ENCOUNTER — Other Ambulatory Visit: Payer: Self-pay

## 2019-05-27 ENCOUNTER — Ambulatory Visit (INDEPENDENT_AMBULATORY_CARE_PROVIDER_SITE_OTHER): Payer: Medicare Other | Admitting: Obstetrics and Gynecology

## 2019-05-27 ENCOUNTER — Encounter: Payer: Self-pay | Admitting: Obstetrics and Gynecology

## 2019-05-27 VITALS — BP 130/84 | HR 72 | Temp 98.1°F | Wt 144.0 lb

## 2019-05-27 DIAGNOSIS — Z4689 Encounter for fitting and adjustment of other specified devices: Secondary | ICD-10-CM

## 2019-05-27 NOTE — Progress Notes (Signed)
GYNECOLOGY  VISIT   HPI: 68 y.o.   Married White or Caucasian Not Hispanic or Latino  female   G1P1001 with No LMP recorded. Patient is postmenopausal.   here for pessary check. She had her #5 ring pessary out for several months (vaginal irritation and covid). She felt uncomfortable when the pessary was placed a week ago, so she was fitted with a #4 pessary. The 4 was uncomfortable, when she returned the next day her #5 was reinserted and felt better.    She has been very comfortable since the #5 ring pessary was reinserted. No bowel or bladder c/o.  Funeral for her brother in law is this afternoon.   GYNECOLOGIC HISTORY: No LMP recorded. Patient is postmenopausal. Contraception:NA Menopausal hormone therapy: estrogen vaginal cream.         OB History    Gravida  1   Para  1   Term  1   Preterm      AB      Living  1     SAB      TAB      Ectopic      Multiple      Live Births  1              Patient Active Problem List   Diagnosis Date Noted  . Chronic midline low back pain without sciatica 04/27/2019  . Allergic rhinitis due to pollen 04/27/2019  . Essential hypertension, benign 04/27/2019  . GAD (generalized anxiety disorder) 09/01/2018  . Chronic migraine without aura without status migrainosus, not intractable 09/01/2018  . Encounter for long-term (current) use of medications 09/01/2018  . Asthma without status asthmaticus 01/30/2018  . Hyperlipidemia, unspecified 01/30/2018  . Hypertension 01/30/2018  . Obesity, unspecified 01/30/2018  . Hypothyroidism 01/30/2018  . Acute respiratory failure with hypoxia ([place]) 12/31/2017  . Fatty infiltration of liver 12/10/2015  . Abdominal pain, LLQ (left lower quadrant) 11/03/2015  . History of adenomatous polyp of colon 11/03/2015  . Hallux valgus, left 09/18/2014  . 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  . Shoulder arthritis 12/19/2013    Past Medical History:  Diagnosis Date  . Allergic rhinitis   .  Anxiety   . Asthma   . Blood transfusion without reported diagnosis   . Depression   . Diverticulosis   . Dyspnea   . Esophagitis   . GERD (gastroesophageal reflux disease)   . Headache   . Heart murmur   . Hyperlipemia   . Hypertension   . Hypothyroidism   . Obesity   . Osteopenia   . Palpitations   . Pneumothorax   . Sleep apnea     Past Surgical History:  Procedure Laterality Date  . BUNIONECTOMY    . CATARACT EXTRACTION W/PHACO Right 10/11/2016   Procedure: CATARACT EXTRACTION PHACO AND INTRAOCULAR LENS PLACEMENT (IOC);  Surgeon: Birder Robson, MD;  Location: [facility] ORS;  Service: Ophthalmology;  Laterality: Right;  Lot# [id number] H [place]: 00:37.7 AP%: 18.1 CDE: 6.80  . CATARACT EXTRACTION W/PHACO Left 11/08/2016   Procedure: CATARACT EXTRACTION PHACO AND INTRAOCULAR LENS PLACEMENT (IOC);  Surgeon: Birder Robson, MD;  Location: [facility] ORS;  Service: Ophthalmology;  Laterality: Left;  PACK LOT: [id number] H US:00:32 AP:44 CDE:6.46  . COLONOSCOPY    . COLONOSCOPY WITH PROPOFOL N/A 11/26/2015   Procedure: COLONOSCOPY WITH PROPOFOL;  Surgeon: Manya Silvas, MD;  Location[facility] ENDOSCOPY;  Service: Endoscopy;  Laterality: N/A;  . EYE SURGERY    . TONSILLECTOMY  Current Outpatient Medications  Medication Sig Dispense Refill  . acetaminophen (TYLENOL) 500 MG tablet Take 1,000 mg by mouth 2 (two) times daily as needed for mild pain or moderate pain.    Marland Kitchen albuterol (VENTOLIN HFA) 108 (90 Base) MCG/ACT inhaler INHALE 2 PUFFS BY MOUTH EVERY 6 HOURS 18 g 5  . ALPRAZolam (XANAX) 0.25 MG tablet Take 1 tablet (0.25 mg total) by mouth 2 (two) times daily as needed for anxiety. 60 tablet 2  . amLODipine (NORVASC) 5 MG tablet Take 1 tablet (5 mg total) by mouth daily. 90 tablet 1  . ATROVENT HFA 17 MCG/ACT inhaler INHALE 2 PUFFS 4 TIMES DAILY 12.9 Inhaler 5  . bisacodyl (DULCOLAX) 5 MG EC tablet Take 5-10 mg by mouth at bedtime as needed for moderate constipation. Two at night    .  cetirizine (ZYRTEC) 10 MG tablet Take 10 mg by mouth daily.    . cholecalciferol (VITAMIN D) 1000 units tablet Take 1,000 Units by mouth at bedtime.    . clindamycin (CLEOCIN T) 1 % external solution APPLY 1 APPLICATION TOPICALLY 2 (TWO) TIMES DAILY AS NEEDED. FOR SKIN BREAKDOWN 30 mL 2  . cyclobenzaprine (FLEXERIL) 5 MG tablet Take 1-2 tablets (5-10 mg total) by mouth 2 (two) times daily as needed for muscle spasms. 120 tablet 2  . docusate sodium (COLACE) 100 MG capsule Take 100-200 mg by mouth at bedtime as needed for mild constipation or moderate constipation.     Marland Kitchen estradiol (ESTRACE) 0.1 MG/GM vaginal cream INSERT 1 GRAM VAGINALLY TWICE WEEKLY AT BEDTIME 42.5 g 1  . fenofibrate (TRICOR) 145 MG tablet Take 1 tablet (145 mg total) by mouth daily. 30 tablet 5  . FLOVENT HFA 110 MCG/ACT inhaler TAKE 1 PUFF BY MOUTH TWICE A DAY 12 Inhaler 3  . FLUoxetine (PROZAC) 40 MG capsule TAKE 1 CAPSULE BY MOUTH EVERY DAY 90 capsule 1  . HYDROcodone-Acetaminophen 5-300 MG TABS Take 1 tablet by mouth 2 (two) times daily as needed. For severe pain 15 each 0  . ipratropium (ATROVENT) 0.02 % nebulizer solution Inhale into the lungs.    . isometheptene-acetaminophen-dichloralphenazone (MIDRIN) 65-100-325 MG capsule Take 1 capsule by mouth 4 (four) times daily as needed for migraine. Maximum 5 capsules in 12 hours for migraine headaches, 8 capsules in 24 hours for tension headaches.    . levothyroxine (SYNTHROID, LEVOTHROID) 100 MCG tablet TAKE 1 TABLET BY MOUTH EVERY DAY IN THE MORNING 90 tablet 3  . lisinopril (PRINIVIL,ZESTRIL) 10 MG tablet TAKE 1 TABLET BY MOUTH EVERY DAY 90 tablet 1  . magnesium oxide (MAG-OX) 400 MG tablet Take 400 mg by mouth at bedtime.     . montelukast (SINGULAIR) 10 MG tablet TAKE 1 TABLET BY MOUTH EVERY DAY 90 tablet 1  . omeprazole (PRILOSEC) 40 MG capsule TAKE ONE CAPSULE BY MOUTH DAILY 90 capsule 3  . triamcinolone cream (KENALOG) 0.1 % Apply 1 application topically 2 (two) times  daily as needed. For skin breakdown    . vitamin B-12 (CYANOCOBALAMIN) 1000 MCG tablet Take 1,000 mcg by mouth daily.     No current facility-administered medications for this visit.      ALLERGIES: Advair diskus [fluticasone-salmeterol], Codeine, Erythromycin, Morphine and related, Nsaids, and Sulfa antibiotics  Family History  Problem Relation Age of Onset  . Breast cancer Maternal Grandmother   . Stroke Maternal Grandmother   . Bladder Cancer Father   . Prostate cancer Father   . Heart attack Father   . Aortic aneurysm  Father   . Congestive Heart Failure Father   . Colon cancer Paternal Grandmother   . Aneurysm Paternal Grandmother   . Stroke Paternal Grandmother   . Congestive Heart Failure Paternal Grandmother   . Dementia Mother   . Stroke Mother   . Dementia Maternal Grandfather   . Stroke Maternal Grandfather   . Dementia Paternal Grandfather   . Stroke Paternal Grandfather     Social History   Socioeconomic History  . Marital status: Married    Spouse name: Not on file  . Number of children: Not on file  . Years of education: Not on file  . Highest education level: Not on file  Occupational History  . Not on file  Social Needs  . Financial resource strain: Not on file  . Food insecurity    Worry: Not on file    Inability: Not on file  . Transportation needs    Medical: Not on file    Non-medical: Not on file  Tobacco Use  . Smoking status: Former Research scientist (life sciences)  . Smokeless tobacco: Never Used  Substance and Sexual Activity  . Alcohol use: No  . Drug use: No  . Sexual activity: Not Currently    Partners: Male    Birth control/protection: Post-menopausal  Lifestyle  . Physical activity    Days per week: Not on file    Minutes per session: Not on file  . Stress: Not on file  Relationships  . Social Herbalist on phone: Not on file    Gets together: Not on file    Attends religious service: Not on file    Active member of club or organization:  Not on file    Attends meetings of clubs or organizations: Not on file    Relationship status: Not on file  . Intimate partner violence    Fear of current or ex partner: Not on file    Emotionally abused: Not on file    Physically abused: Not on file    Forced sexual activity: Not on file  Other Topics Concern  . Not on file  Social History Narrative  . Not on file    Review of Systems  Constitutional: Negative.   HENT: Negative.   Eyes: Negative.   Respiratory: Negative.   Cardiovascular: Negative.   Gastrointestinal: Negative.   Genitourinary: Negative.   Musculoskeletal: Negative.   Skin: Negative.   Neurological: Negative.   Endo/Heme/Allergies: Negative.   Psychiatric/Behavioral: Negative.     PHYSICAL EXAMINATION:    BP 130/84 (BP Location: Right Arm, Patient Position: Sitting, Cuff Size: Normal)   Pulse 72   Temp 98.1 F (36.7 C) (Skin)   Wt 144 lb (65.3 kg)   BMI 27.21 kg/m     General appearance: alert, cooperative and appears stated age   Pelvic: External genitalia:  no lesions              Urethra:  normal appearing urethra with no masses, tenderness or lesions              Bartholins and Skenes: normal                 Vagina: the pessary was removed and cleaned. No vaginal irritation. 1 gram of estrace cream was placed and the pessary was replaced              Cervix: no lesions              Chaperone  was present for exam.  ASSESSMENT Genital prolapse, controlled with the pessary, but recurrent issues with irritation. Currently doing well with the #5 ring pessary with support.     PLAN Continue with estrogen cream 2 x a week F/U in 2 weeks.    An After Visit Summary was printed and given to the patient.

## 2019-05-30 ENCOUNTER — Ambulatory Visit: Payer: Self-pay | Admitting: Obstetrics and Gynecology

## 2019-06-06 ENCOUNTER — Other Ambulatory Visit: Payer: Self-pay

## 2019-06-06 ENCOUNTER — Encounter: Payer: Self-pay | Admitting: Internal Medicine

## 2019-06-06 ENCOUNTER — Ambulatory Visit: Payer: Medicare Other | Admitting: Internal Medicine

## 2019-06-06 VITALS — BP 108/72 | HR 67 | Temp 96.9°F | Ht 61.0 in | Wt 140.0 lb

## 2019-06-06 DIAGNOSIS — J4540 Moderate persistent asthma, uncomplicated: Secondary | ICD-10-CM

## 2019-06-06 DIAGNOSIS — J301 Allergic rhinitis due to pollen: Secondary | ICD-10-CM

## 2019-06-06 DIAGNOSIS — I10 Essential (primary) hypertension: Secondary | ICD-10-CM

## 2019-06-06 MED ORDER — PREDNISONE 10 MG PO TABS: ORAL_TABLET | ORAL | 0 refills | Status: DC

## 2019-06-06 MED ORDER — AZITHROMYCIN 250 MG PO TABS: ORAL_TABLET | ORAL | 0 refills | Status: DC

## 2019-06-06 NOTE — Progress Notes (Signed)
[facility] [place], [place] [address]  Internal MEDICINE  Telephone Visit  Patient Name: Bonnie West  [id number]  [id number]  Date of Service: 06/06/2019  I connected with the patient at 1103by telephone and verified the patients identity using two identifiers.   I discussed the limitations, risks, security and privacy concerns of performing an evaluation and management service by telephone and the availability of in person appointments. I also discussed with the patient that there may be a patient responsible charge related to the service.  The patient expressed understanding and agrees to proceed.    Chief Complaint  Patient presents with  . Telephone Assessment  . Telephone Screen  . Medical Management of Chronic Issues     going on for 2 days   . Cough  . Wheezing    HPI Pt seen via telephone reporting flare up of asthma.  She reports she has had a small amount of productivity with cough.  She has been wheezing intermittently for a few days. She denies fever, or chest pain.  She reports some mild improvement today but is still wheezing.       Current Medication: Outpatient Encounter Medications as of 06/06/2019  Medication Sig  . acetaminophen (TYLENOL) 500 MG tablet Take 1,000 mg by mouth 2 (two) times daily as needed for mild pain or moderate pain.  Marland Kitchen albuterol (VENTOLIN HFA) 108 (90 Base) MCG/ACT inhaler INHALE 2 PUFFS BY MOUTH EVERY 6 HOURS  . ALPRAZolam (XANAX) 0.25 MG tablet Take 1 tablet (0.25 mg total) by mouth 2 (two) times daily as needed for anxiety.  Marland Kitchen amLODipine (NORVASC) 5 MG tablet Take 1 tablet (5 mg total) by mouth daily.  . ATROVENT HFA 17 MCG/ACT inhaler INHALE 2 PUFFS 4 TIMES DAILY  . bisacodyl (DULCOLAX) 5 MG EC tablet Take 5-10 mg by mouth at bedtime as needed for moderate constipation. Two at night  . cetirizine (ZYRTEC) 10 MG tablet Take 10 mg by mouth daily.  . cholecalciferol (VITAMIN D) 1000 units tablet Take 1,000 Units  by mouth at bedtime.  . clindamycin (CLEOCIN T) 1 % external solution APPLY 1 APPLICATION TOPICALLY 2 (TWO) TIMES DAILY AS NEEDED. FOR SKIN BREAKDOWN  . cyclobenzaprine (FLEXERIL) 5 MG tablet Take 1-2 tablets (5-10 mg total) by mouth 2 (two) times daily as needed for muscle spasms.  Marland Kitchen docusate sodium (COLACE) 100 MG capsule Take 100-200 mg by mouth at bedtime as needed for mild constipation or moderate constipation.   Marland Kitchen estradiol (ESTRACE) 0.1 MG/GM vaginal cream INSERT 1 GRAM VAGINALLY TWICE WEEKLY AT BEDTIME  . fenofibrate (TRICOR) 145 MG tablet Take 1 tablet (145 mg total) by mouth daily.  Marland Kitchen FLOVENT HFA 110 MCG/ACT inhaler TAKE 1 PUFF BY MOUTH TWICE A DAY  . FLUoxetine (PROZAC) 40 MG capsule TAKE 1 CAPSULE BY MOUTH EVERY DAY  . HYDROcodone-Acetaminophen 5-300 MG TABS Take 1 tablet by mouth 2 (two) times daily as needed. For severe pain  . ipratropium (ATROVENT) 0.02 % nebulizer solution Inhale into the lungs.  . isometheptene-acetaminophen-dichloralphenazone (MIDRIN) 65-100-325 MG capsule Take 1 capsule by mouth 4 (four) times daily as needed for migraine. Maximum 5 capsules in 12 hours for migraine headaches, 8 capsules in 24 hours for tension headaches.  . levothyroxine (SYNTHROID, LEVOTHROID) 100 MCG tablet TAKE 1 TABLET BY MOUTH EVERY DAY IN THE MORNING  . lisinopril (PRINIVIL,ZESTRIL) 10 MG tablet TAKE 1 TABLET BY MOUTH EVERY DAY  . magnesium oxide (MAG-OX) 400 MG tablet Take 400  mg by mouth at bedtime.   . montelukast (SINGULAIR) 10 MG tablet TAKE 1 TABLET BY MOUTH EVERY DAY  . omeprazole (PRILOSEC) 40 MG capsule TAKE ONE CAPSULE BY MOUTH DAILY  . triamcinolone cream (KENALOG) 0.1 % Apply 1 application topically 2 (two) times daily as needed. For skin breakdown  . vitamin B-12 (CYANOCOBALAMIN) 1000 MCG tablet Take 1,000 mcg by mouth daily.   No facility-administered encounter medications on file as of 06/06/2019.     Surgical History: Past Surgical History:  Procedure Laterality Date   . BUNIONECTOMY    . CATARACT EXTRACTION W/PHACO Right 10/11/2016   Procedure: CATARACT EXTRACTION PHACO AND INTRAOCULAR LENS PLACEMENT (IOC);  Surgeon: Birder Robson, MD;  Location: [facility] ORS;  Service: Ophthalmology;  Laterality: Right;  Lot# [id number] H [place]: 00:37.7 AP%: 18.1 CDE: 6.80  . CATARACT EXTRACTION W/PHACO Left 11/08/2016   Procedure: CATARACT EXTRACTION PHACO AND INTRAOCULAR LENS PLACEMENT (IOC);  Surgeon: Birder Robson, MD;  Location: [facility] ORS;  Service: Ophthalmology;  Laterality: Left;  PACK LOT: [id number] H US:00:32 AP:44 CDE:6.46  . COLONOSCOPY    . COLONOSCOPY WITH PROPOFOL N/A 11/26/2015   Procedure: COLONOSCOPY WITH PROPOFOL;  Surgeon: Manya Silvas, MD;  Location: [facility] ENDOSCOPY;  Service: Endoscopy;  Laterality: N/A;  . EYE SURGERY    . TONSILLECTOMY      Medical History: Past Medical History:  Diagnosis Date  . Allergic rhinitis   . Anxiety   . Asthma   . Blood transfusion without reported diagnosis   . Depression   . Diverticulosis   . Dyspnea   . Esophagitis   . GERD (gastroesophageal reflux disease)   . Headache   . Heart murmur   . Hyperlipemia   . Hypertension   . Hypothyroidism   . Obesity   . Osteopenia   . Palpitations   . Pneumothorax   . Sleep apnea     Family History: Family History  Problem Relation Age of Onset  . Breast cancer Maternal Grandmother   . Stroke Maternal Grandmother   . Bladder Cancer Father   . Prostate cancer Father   . Heart attack Father   . Aortic aneurysm Father   . Congestive Heart Failure Father   . Colon cancer Paternal Grandmother   . Aneurysm Paternal Grandmother   . Stroke Paternal Grandmother   . Congestive Heart Failure Paternal Grandmother   . Dementia Mother   . Stroke Mother   . Dementia Maternal Grandfather   . Stroke Maternal Grandfather   . Dementia Paternal Grandfather   . Stroke Paternal Grandfather     Social History   Socioeconomic History  . Marital status: Married     Spouse name: Not on file  . Number of children: Not on file  . Years of education: Not on file  . Highest education level: Not on file  Occupational History  . Not on file  Social Needs  . Financial resource strain: Not on file  . Food insecurity    Worry: Not on file    Inability: Not on file  . Transportation needs    Medical: Not on file    Non-medical: Not on file  Tobacco Use  . Smoking status: Former Research scientist (life sciences)  . Smokeless tobacco: Never Used  Substance and Sexual Activity  . Alcohol use: No  . Drug use: No  . Sexual activity: Not Currently    Partners: Male    Birth control/protection: Post-menopausal  Lifestyle  . Physical activity    Days per week:  Not on file    Minutes per session: Not on file  . Stress: Not on file  Relationships  . Social Herbalist on phone: Not on file    Gets together: Not on file    Attends religious service: Not on file    Active member of club or organization: Not on file    Attends meetings of clubs or organizations: Not on file    Relationship status: Not on file  . Intimate partner violence    Fear of current or ex partner: Not on file    Emotionally abused: Not on file    Physically abused: Not on file    Forced sexual activity: Not on file  Other Topics Concern  . Not on file  Social History Narrative  . Not on file    Review of Systems  Constitutional: Negative for chills, fatigue and unexpected weight change.  HENT: Negative for congestion, rhinorrhea, sneezing and sore throat.   Eyes: Negative for photophobia, pain and redness.  Respiratory: Positive for cough, shortness of breath and wheezing. Negative for chest tightness.   Cardiovascular: Negative for chest pain and palpitations.  Gastrointestinal: Negative for abdominal pain, constipation, diarrhea, nausea and vomiting.  Endocrine: Negative.   Genitourinary: Negative for dysuria and frequency.  Musculoskeletal: Negative for arthralgias, back pain, joint  swelling and neck pain.  Skin: Negative for rash.  Allergic/Immunologic: Negative.   Neurological: Negative for tremors and numbness.  Hematological: Negative for adenopathy. Does not bruise/bleed easily.  Psychiatric/Behavioral: Negative for behavioral problems and sleep disturbance. The patient is not nervous/anxious.     Vital Signs: BP 108/72   Pulse 67   Temp (!) 96.9 F (36.1 C)   Ht 5\' 1"  (1.549 m)   Wt 140 lb (63.5 kg)   BMI 26.45 kg/m    Observation/Objective:  Speaking in full sentences on the phone.  Conversing with ease.  Some coughing noted.  NAD at this time.    Assessment/Plan: 1. Moderate persistent asthma without status asthmaticus without complication Continue to use nebulizer and inhaler as instructed. Take prednisone as directed. Advised patient to take entire course of antibiotics as prescribed with food. Pt should return to clinic in 7-10 days if symptoms fail to improve or new symptoms develop.  - predniSONE (DELTASONE) 10 MG tablet; Use per dose pack  Dispense: 21 tablet; Refill: 0 - azithromycin (ZITHROMAX) 250 MG tablet; Take as directed.  Dispense: 6 tablet; Refill: 0  2. Allergic rhinitis due to pollen, unspecified seasonality Sable, pt denies issues currently.  Encouraged her to continue to take medications for allergies.   3. Essential hypertension, benign Stable, continue current management.   General Counseling: rona tomson understanding of the findings of today's phone visit and agrees with plan of treatment. I have discussed any further diagnostic evaluation that may be needed or ordered today. We also reviewed her medications today. she has been encouraged to call the office with any questions or concerns that should arise related to todays visit.    No orders of the defined types were placed in this encounter.   No orders of the defined types were placed in this encounter.   Time spent [place]  AGNP-C Internal medicine

## 2019-06-06 NOTE — Progress Notes (Signed)
GYNECOLOGY  VISIT   HPI: 68 y.o.   Married White or Caucasian Not Hispanic or Latino  female   [id number] with No LMP recorded. Patient is postmenopausal.   here for pessary check. She has a #5 ring pessary with support, replaced 2 weeks ago (had gone a while without it and then tried the #4). No complaints. She fell yesterday and cut open her head, had 2 staples placed.   GYNECOLOGIC HISTORY: No LMP recorded. Patient is postmenopausal. Contraception: Postmenopausal Menopausal hormone therapy: Estradiol vaginal cream        OB History    Gravida  1   Para  1   Term  1   Preterm      AB      Living  1     SAB      TAB      Ectopic      Multiple      Live Births  1              Patient Active Problem List   Diagnosis Date Noted  . Chronic midline low back pain without sciatica 04/27/2019  . Allergic rhinitis due to pollen 04/27/2019  . Essential hypertension, benign 04/27/2019  . GAD (generalized anxiety disorder) 09/01/2018  . Chronic migraine without aura without status migrainosus, not intractable 09/01/2018  . Encounter for long-term (current) use of medications 09/01/2018  . Asthma without status asthmaticus 01/30/2018  . Hyperlipidemia, unspecified 01/30/2018  . Hypertension 01/30/2018  . Obesity, unspecified 01/30/2018  . Hypothyroidism 01/30/2018  . Acute respiratory failure with hypoxia ([place]) 12/31/2017  . Fatty infiltration of liver 12/10/2015  . Abdominal pain, LLQ (left lower quadrant) 11/03/2015  . History of adenomatous polyp of colon 11/03/2015  . Hallux valgus, left 09/18/2014  . 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  . Shoulder arthritis 12/19/2013    Past Medical History:  Diagnosis Date  . Allergic rhinitis   . Anxiety   . Asthma   . Blood transfusion without reported diagnosis   . Depression   . Diverticulosis   . Dyspnea   . Esophagitis   . GERD (gastroesophageal reflux disease)   . Headache   . Heart murmur   .  Hyperlipemia   . Hypertension   . Hypothyroidism   . Obesity   . Osteopenia   . Palpitations   . Pneumothorax   . Sleep apnea     Past Surgical History:  Procedure Laterality Date  . BUNIONECTOMY    . CATARACT EXTRACTION W/PHACO Right 10/11/2016   Procedure: CATARACT EXTRACTION PHACO AND INTRAOCULAR LENS PLACEMENT (IOC);  Surgeon: Birder Robson, MD;  Location: [facility] ORS;  Service: Ophthalmology;  Laterality: Right;  Lot# [id number] H [place]: 00:37.7 AP%: 18.1 CDE: 6.80  . CATARACT EXTRACTION W/PHACO Left 11/08/2016   Procedure: CATARACT EXTRACTION PHACO AND INTRAOCULAR LENS PLACEMENT (IOC);  Surgeon: Birder Robson, MD;  Location: [facility] ORS;  Service: Ophthalmology;  Laterality: Left;  PACK LOT: [id number] H US:00:32 AP:44 CDE:6.46  . COLONOSCOPY    . COLONOSCOPY WITH PROPOFOL N/A 11/26/2015   Procedure: COLONOSCOPY WITH PROPOFOL;  Surgeon: Manya Silvas, MD;  Location: [facility] ENDOSCOPY;  Service: Endoscopy;  Laterality: N/A;  . EYE SURGERY    . TONSILLECTOMY      Current Outpatient Medications  Medication Sig Dispense Refill  . acetaminophen (TYLENOL) 500 MG tablet Take 1,000 mg by mouth 2 (two) times daily as needed for mild pain or moderate pain.    Marland Kitchen albuterol (VENTOLIN HFA) 108 (90 Base)  MCG/ACT inhaler INHALE 2 PUFFS BY MOUTH EVERY 6 HOURS 18 g 5  . ALPRAZolam (XANAX) 0.25 MG tablet Take 1 tablet (0.25 mg total) by mouth 2 (two) times daily as needed for anxiety. 60 tablet 2  . amLODipine (NORVASC) 5 MG tablet Take 1 tablet (5 mg total) by mouth daily. 90 tablet 1  . ATROVENT HFA 17 MCG/ACT inhaler INHALE 2 PUFFS 4 TIMES DAILY 12.9 Inhaler 5  . bisacodyl (DULCOLAX) 5 MG EC tablet Take 5-10 mg by mouth at bedtime as needed for moderate constipation. Two at night    . cetirizine (ZYRTEC) 10 MG tablet Take 10 mg by mouth daily.    . cholecalciferol (VITAMIN D) 1000 units tablet Take 1,000 Units by mouth at bedtime.    . clindamycin (CLEOCIN T) 1 % external solution APPLY 1  APPLICATION TOPICALLY 2 (TWO) TIMES DAILY AS NEEDED. FOR SKIN BREAKDOWN 30 mL 2  . cyclobenzaprine (FLEXERIL) 5 MG tablet Take 1-2 tablets (5-10 mg total) by mouth 2 (two) times daily as needed for muscle spasms. 120 tablet 2  . docusate sodium (COLACE) 100 MG capsule Take 100-200 mg by mouth at bedtime as needed for mild constipation or moderate constipation.     Marland Kitchen estradiol (ESTRACE) 0.1 MG/GM vaginal cream INSERT 1 GRAM VAGINALLY TWICE WEEKLY AT BEDTIME 42.5 g 1  . fenofibrate (TRICOR) 145 MG tablet Take 1 tablet (145 mg total) by mouth daily. 30 tablet 5  . FLOVENT HFA 110 MCG/ACT inhaler TAKE 1 PUFF BY MOUTH TWICE A DAY 12 Inhaler 3  . FLUoxetine (PROZAC) 40 MG capsule TAKE 1 CAPSULE BY MOUTH EVERY DAY 90 capsule 1  . HYDROcodone-Acetaminophen 5-300 MG TABS Take 1 tablet by mouth 2 (two) times daily as needed. For severe pain 15 each 0  . ipratropium (ATROVENT) 0.02 % nebulizer solution Inhale into the lungs.    . isometheptene-acetaminophen-dichloralphenazone (MIDRIN) 65-100-325 MG capsule Take 1 capsule by mouth 4 (four) times daily as needed for migraine. Maximum 5 capsules in 12 hours for migraine headaches, 8 capsules in 24 hours for tension headaches.    . levothyroxine (SYNTHROID, LEVOTHROID) 100 MCG tablet TAKE 1 TABLET BY MOUTH EVERY DAY IN THE MORNING 90 tablet 3  . lisinopril (PRINIVIL,ZESTRIL) 10 MG tablet TAKE 1 TABLET BY MOUTH EVERY DAY 90 tablet 1  . magnesium oxide (MAG-OX) 400 MG tablet Take 400 mg by mouth at bedtime.     . montelukast (SINGULAIR) 10 MG tablet TAKE 1 TABLET BY MOUTH EVERY DAY 90 tablet 1  . omeprazole (PRILOSEC) 40 MG capsule TAKE ONE CAPSULE BY MOUTH DAILY 90 capsule 3  . triamcinolone cream (KENALOG) 0.1 % Apply 1 application topically 2 (two) times daily as needed. For skin breakdown    . vitamin B-12 (CYANOCOBALAMIN) 1000 MCG tablet Take 1,000 mcg by mouth daily.     No current facility-administered medications for this visit.      ALLERGIES: Advair  diskus [fluticasone-salmeterol], Codeine, Erythromycin, Morphine and related, Nsaids, and Sulfa antibiotics  Family History  Problem Relation Age of Onset  . Breast cancer Maternal Grandmother   . Stroke Maternal Grandmother   . Bladder Cancer Father   . Prostate cancer Father   . Heart attack Father   . Aortic aneurysm Father   . Congestive Heart Failure Father   . Colon cancer Paternal Grandmother   . Aneurysm Paternal Grandmother   . Stroke Paternal Grandmother   . Congestive Heart Failure Paternal Grandmother   . Dementia Mother   .  Stroke Mother   . Dementia Maternal Grandfather   . Stroke Maternal Grandfather   . Dementia Paternal Grandfather   . Stroke Paternal Grandfather     Social History   Socioeconomic History  . Marital status: Married    Spouse name: Not on file  . Number of children: Not on file  . Years of education: Not on file  . Highest education level: Not on file  Occupational History  . Not on file  Social Needs  . Financial resource strain: Not on file  . Food insecurity    Worry: Not on file    Inability: Not on file  . Transportation needs    Medical: Not on file    Non-medical: Not on file  Tobacco Use  . Smoking status: Former Research scientist (life sciences)  . Smokeless tobacco: Never Used  Substance and Sexual Activity  . Alcohol use: No  . Drug use: No  . Sexual activity: Not Currently    Partners: Male    Birth control/protection: Post-menopausal  Lifestyle  . Physical activity    Days per week: Not on file    Minutes per session: Not on file  . Stress: Not on file  Relationships  . Social Herbalist on phone: Not on file    Gets together: Not on file    Attends religious service: Not on file    Active member of club or organization: Not on file    Attends meetings of clubs or organizations: Not on file    Relationship status: Not on file  . Intimate partner violence    Fear of current or ex partner: Not on file    Emotionally abused:  Not on file    Physically abused: Not on file    Forced sexual activity: Not on file  Other Topics Concern  . Not on file  Social History Narrative  . Not on file    Review of Systems  Constitutional: Negative.   HENT: Negative.   Eyes: Negative.   Respiratory: Negative.   Cardiovascular: Negative.   Gastrointestinal: Negative.   Genitourinary: Negative.   Musculoskeletal: Negative.   Skin: Negative.   Neurological: Negative.   Endo/Heme/Allergies: Negative.   Psychiatric/Behavioral: Negative.     PHYSICAL EXAMINATION:    BP 132/84 (BP Location: Right Arm, Patient Position: Sitting, Cuff Size: Normal)   Pulse 80   Temp 98.1 F (36.7 C) (Skin)   Wt 144 lb (65.3 kg)   BMI 27.21 kg/m     General appearance: alert, cooperative and appears stated age  Pelvic: External genitalia:  no lesions              Urethra:  normal appearing urethra with no masses, tenderness or lesions              Bartholins and Skenes: normal                 Vagina: the pessary was removed and cleaned, small amount of erythema in the posterior vaginal apex. 1 gram of estrace placed, pessary replaced.               Cervix: no lesions               Chaperone was present for exam.  ASSESSMENT Cystocele and uterine prolapse well controlled with the #5 ring pessary with support. Prior h/o vaginal irritation with other pessaries. Slight vaginal erythema today    PLAN Estrace cream placed, pessary replaced F/U in  one month, call with any concerns Continue with 2 x a week estrogen cream   An After Visit Summary was printed and given to the patient.

## 2019-06-09 ENCOUNTER — Ambulatory Visit: Payer: Medicare Other

## 2019-06-09 ENCOUNTER — Other Ambulatory Visit: Payer: Self-pay

## 2019-06-09 ENCOUNTER — Ambulatory Visit
Admission: EM | Admit: 2019-06-09 | Discharge: 2019-06-09 | Disposition: A | Discharge status: Home / Self Care | Payer: Medicare Other | Attending: Family Medicine | Admitting: Family Medicine

## 2019-06-09 DIAGNOSIS — S0990XA Unspecified injury of head, initial encounter: Secondary | ICD-10-CM

## 2019-06-09 DIAGNOSIS — R51 Headache: Secondary | ICD-10-CM | POA: Diagnosis not present

## 2019-06-09 DIAGNOSIS — W06XXXA Fall from bed, initial encounter: Secondary | ICD-10-CM

## 2019-06-09 DIAGNOSIS — S0101XA Laceration without foreign body of scalp, initial encounter: Secondary | ICD-10-CM

## 2019-06-09 NOTE — ED Triage Notes (Signed)
Patient states that she fell out of her bed and hit a desk chair with her head. Patient states that she had a lot of blood in the floor where she hit. Patient reports that she did not lose consciousness.

## 2019-06-09 NOTE — ED Provider Notes (Signed)
[facility]    CSN: [id number] Arrival date & time: 06/09/19  0906   History   Chief Complaint Chief Complaint  Patient presents with  . Fall  . Head Injury   HPI  68 year old female presents with the above complaints.  Patient states that she was reaching for her phone this morning.  It subsequently fell on the floor.  She reached for it again and essentially fell out of bed.  She believes that she hit her head on a chair.  She suffered a laceration to her posterior scalp.  She states that there was a great deal of blood.  No loss of consciousness.  She called for help and received no response.  She subsequently called the house and her husband answered the phone and subsequently provided help.  She is currently reporting headache.  Pain is 7/10 in severity.  Pain is located at the area of the laceration.  No reports of vision changes.  No reports of weakness.  She is not on anticoagulation.  No other associated symptoms.  No other complaints.  PMH, Surgical Hx, Family Hx, Social History reviewed and updated as below.  Past Medical History:  Diagnosis Date  . Allergic rhinitis   . Anxiety   . Asthma   . Blood transfusion without reported diagnosis   . Depression   . Diverticulosis   . Dyspnea   . Esophagitis   . GERD (gastroesophageal reflux disease)   . Headache   . Heart murmur   . Hyperlipemia   . Hypertension   . Hypothyroidism   . Obesity   . Osteopenia   . Palpitations   . Pneumothorax   . Sleep apnea     Patient Active Problem List   Diagnosis Date Noted  . Chronic midline low back pain without sciatica 04/27/2019  . Allergic rhinitis due to pollen 04/27/2019  . Essential hypertension, benign 04/27/2019  . GAD (generalized anxiety disorder) 09/01/2018  . Chronic migraine without aura without status migrainosus, not intractable 09/01/2018  . Encounter for long-term (current) use of medications 09/01/2018  . Asthma without status asthmaticus  01/30/2018  . Hyperlipidemia, unspecified 01/30/2018  . Hypertension 01/30/2018  . Obesity, unspecified 01/30/2018  . Hypothyroidism 01/30/2018  . Acute respiratory failure with hypoxia ([place]) 12/31/2017  . Fatty infiltration of liver 12/10/2015  . Abdominal pain, LLQ (left lower quadrant) 11/03/2015  . History of adenomatous polyp of colon 11/03/2015  . Hallux valgus, left 09/18/2014  . 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  . Shoulder arthritis 12/19/2013    Past Surgical History:  Procedure Laterality Date  . BUNIONECTOMY    . CATARACT EXTRACTION W/PHACO Right 10/11/2016   Procedure: CATARACT EXTRACTION PHACO AND INTRAOCULAR LENS PLACEMENT (IOC);  Surgeon: Birder Robson, MD;  Location: [facility] ORS;  Service: Ophthalmology;  Laterality: Right;  Lot# [id number] H [place]: 00:37.7 AP%: 18.1 CDE: 6.80  . CATARACT EXTRACTION W/PHACO Left 11/08/2016   Procedure: CATARACT EXTRACTION PHACO AND INTRAOCULAR LENS PLACEMENT (IOC);  Surgeon: Birder Robson, MD;  Location: [facility] ORS;  Service: Ophthalmology;  Laterality: Left;  PACK LOT: [id number] H US:00:32 AP:44 CDE:6.46  . COLONOSCOPY    . COLONOSCOPY WITH PROPOFOL N/A 11/26/2015   Procedure: COLONOSCOPY WITH PROPOFOL;  Surgeon: Manya Silvas, MD;  Location: [facility] ENDOSCOPY;  Service: Endoscopy;  Laterality: N/A;  . EYE SURGERY    . TONSILLECTOMY      OB History    Gravida  1   Para  1   Term  1  Preterm      AB      Living  1     SAB      TAB      Ectopic      Multiple      Live Births  1            Home Medications    Prior to Admission medications   Medication Sig Start Date End Date Taking? Authorizing Provider  acetaminophen (TYLENOL) 500 MG tablet Take 1,000 mg by mouth 2 (two) times daily as needed for mild pain or moderate pain.   Yes [provider]  albuterol (VENTOLIN HFA) 108 (90 Base) MCG/ACT inhaler INHALE 2 PUFFS BY MOUTH EVERY 6 HOURS 04/11/19  Yes Boscia, Greer Ee, NP  ALPRAZolam  (XANAX) 0.25 MG tablet Take 1 tablet (0.25 mg total) by mouth 2 (two) times daily as needed for anxiety. 04/11/19  Yes Boscia, Heather E, NP  amLODipine (NORVASC) 5 MG tablet Take 1 tablet (5 mg total) by mouth daily. 02/19/19  Yes Kendell Bane, NP  ATROVENT HFA 17 MCG/ACT inhaler INHALE 2 PUFFS 4 TIMES DAILY 12/17/18  Yes Scarboro, Audie Clear, NP  bisacodyl (DULCOLAX) 5 MG EC tablet Take 5-10 mg by mouth at bedtime as needed for moderate constipation. Two at night   Yes [provider]  cetirizine (ZYRTEC) 10 MG tablet Take 10 mg by mouth daily.   Yes [provider]  cholecalciferol (VITAMIN D) 1000 units tablet Take 1,000 Units by mouth at bedtime.   Yes [provider]  clindamycin (CLEOCIN T) 1 % external solution APPLY 1 APPLICATION TOPICALLY 2 (TWO) TIMES DAILY AS NEEDED. FOR SKIN BREAKDOWN 02/17/19  Yes Ronnell Freshwater, NP  cyclobenzaprine (FLEXERIL) 5 MG tablet Take 1-2 tablets (5-10 mg total) by mouth 2 (two) times daily as needed for muscle spasms. 04/11/19  Yes Boscia, Greer Ee, NP  docusate sodium (COLACE) 100 MG capsule Take 100-200 mg by mouth at bedtime as needed for mild constipation or moderate constipation.    Yes [provider]  estradiol (ESTRACE) 0.1 MG/GM vaginal cream INSERT 1 GRAM VAGINALLY TWICE WEEKLY AT BEDTIME 05/21/19  Yes Salvadore Dom, MD  fenofibrate (TRICOR) 145 MG tablet Take 1 tablet (145 mg total) by mouth daily. 02/15/19  Yes Ronnell Freshwater, NP  FLOVENT HFA 110 MCG/ACT inhaler TAKE 1 PUFF BY MOUTH TWICE A DAY 11/08/18  Yes Scarboro, Audie Clear, NP  FLUoxetine (PROZAC) 40 MG capsule TAKE 1 CAPSULE BY MOUTH EVERY DAY 02/19/19  Yes Scarboro, Audie Clear, NP  HYDROcodone-Acetaminophen 5-300 MG TABS Take 1 tablet by mouth 2 (two) times daily as needed. For severe pain 04/11/19  Yes Boscia, Heather E, NP  ipratropium (ATROVENT) 0.02 % nebulizer solution Inhale into the lungs.   Yes [provider]   isometheptene-acetaminophen-dichloralphenazone (MIDRIN) 65-100-325 MG capsule Take 1 capsule by mouth 4 (four) times daily as needed for migraine. Maximum 5 capsules in 12 hours for migraine headaches, 8 capsules in 24 hours for tension headaches.   Yes [provider]  levothyroxine (SYNTHROID, LEVOTHROID) 100 MCG tablet TAKE 1 TABLET BY MOUTH EVERY DAY IN THE MORNING 02/14/19  Yes Boscia, Heather E, NP  lisinopril (PRINIVIL,ZESTRIL) 10 MG tablet TAKE 1 TABLET BY MOUTH EVERY DAY 01/11/19  Yes Boscia, Heather E, NP  magnesium oxide (MAG-OX) 400 MG tablet Take 400 mg by mouth at bedtime.    Yes [provider]  montelukast (SINGULAIR) 10 MG tablet TAKE 1 TABLET  BY MOUTH EVERY DAY 04/10/19  Yes Boscia, Greer Ee, NP  omeprazole (PRILOSEC) 40 MG capsule TAKE ONE CAPSULE BY MOUTH DAILY 11/02/18  Yes Scarboro, Audie Clear, NP  triamcinolone cream (KENALOG) 0.1 % Apply 1 application topically 2 (two) times daily as needed. For skin breakdown   Yes [provider]  vitamin B-12 (CYANOCOBALAMIN) 1000 MCG tablet Take 1,000 mcg by mouth daily.   Yes [provider]    Family History Family History  Problem Relation Age of Onset  . Breast cancer Maternal Grandmother   . Stroke Maternal Grandmother   . Bladder Cancer Father   . Prostate cancer Father   . Heart attack Father   . Aortic aneurysm Father   . Congestive Heart Failure Father   . Colon cancer Paternal Grandmother   . Aneurysm Paternal Grandmother   . Stroke Paternal Grandmother   . Congestive Heart Failure Paternal Grandmother   . Dementia Mother   . Stroke Mother   . Dementia Maternal Grandfather   . Stroke Maternal Grandfather   . Dementia Paternal Grandfather   . Stroke Paternal Grandfather     Social History Social History   Tobacco Use  . Smoking status: Former Research scientist (life sciences)  . Smokeless tobacco: Never Used  Substance Use Topics  . Alcohol use: No  . Drug use: No     Allergies   Advair diskus  [fluticasone-salmeterol], Codeine, Erythromycin, Morphine and related, Nsaids, and Sulfa antibiotics   Review of Systems Review of Systems  Constitutional: Negative.   HENT:       Head injury, fall.  Skin: Positive for wound.  Neurological: Negative.    Physical Exam Triage Vital Signs ED Triage Vitals  Enc Vitals Group     BP 06/09/19 0915 131/90     Pulse Rate 06/09/19 0915 87     Resp 06/09/19 0915 18     Temp 06/09/19 0915 98.3 F (36.8 C)     Temp Source 06/09/19 0915 Oral     SpO2 06/09/19 0915 99 %     Weight 06/09/19 0913 140 lb (63.5 kg)     Height --      Head Circumference --      Peak Flow --      Pain Score 06/09/19 0913 7     Pain Loc --      Pain Edu? --      Excl. in [place]? --    Updated Vital Signs BP 131/90 (BP Location: Right Arm)   Pulse 87   Temp 98.3 F (36.8 C) (Oral)   Resp 18   Wt 63.5 kg   SpO2 99%   BMI 26.45 kg/m   Visual Acuity Right Eye Distance:   Left Eye Distance:   Bilateral Distance:    Right Eye Near:   Left Eye Near:    Bilateral Near:     Physical Exam Vitals signs and nursing note reviewed.  Constitutional:      General: She is not in acute distress.    Appearance: Normal appearance.  HENT:     Head:      Comments: 1.5 cm Laceration noted to the scalp.    Nose: Nose normal.     Mouth/Throat:     Mouth: Mucous membranes are moist.     Pharynx: Oropharynx is clear. No posterior oropharyngeal erythema.  Eyes:     General:        Right eye: No discharge.        Left  eye: No discharge.     Extraocular Movements: Extraocular movements intact.     Conjunctiva/sclera: Conjunctivae normal.     Pupils: Pupils are equal, round, and reactive to light.  Cardiovascular:     Rate and Rhythm: Normal rate and regular rhythm.  Pulmonary:     Effort: Pulmonary effort is normal.     Breath sounds: Normal breath sounds. No wheezing, rhonchi or rales.  Neurological:     General: No focal deficit present.     Mental Status:  She is alert and oriented to person, place, and time.     Cranial Nerves: No cranial nerve deficit.     Motor: No weakness.  Psychiatric:        Mood and Affect: Mood normal.        Behavior: Behavior normal.    UC Treatments / Results  Labs (all labs ordered are listed, but only abnormal results are displayed) Labs Reviewed - No data to display  EKG   Radiology Ct Head Wo Contrast  Result Date: 06/09/2019 CLINICAL DATA:  Fall hitting back of head, laceration and headache. EXAM: CT HEAD WITHOUT CONTRAST TECHNIQUE: Contiguous axial images were obtained from the base of the skull through the vertex without intravenous contrast. COMPARISON:  None. FINDINGS: Brain: There is no mass, hemorrhage, edema or other evidence of acute parenchymal abnormality. No extra-axial hemorrhage. No hydrocephalus. Vascular: No hyperdense vessel or unexpected calcification. Skull: Normal. Negative for fracture or focal lesion. Sinuses/Orbits: No acute finding. Other: Scalp edema/laceration overlying the LEFT parietal-occipital bone. No underlying fracture. IMPRESSION: 1. Scalp edema/laceration overlying the LEFT parietal-occipital bone. No underlying fracture. 2. No acute intracranial abnormality. No intracranial mass, hemorrhage or edema. Electronically Signed   By: Franki Cabot M.D.   On: 06/09/2019 10:11    Procedures Laceration Repair  Date/Time: 06/09/2019 9:57 AM Performed by: Coral Spikes, DO Authorized by: Coral Spikes, DO   Consent:    Consent obtained:  Verbal   Consent given by:  Patient Anesthesia (see MAR for exact dosages):    Anesthesia method:  Local infiltration   Local anesthetic:  Lidocaine 1% WITH epi Laceration details:    Location:  Scalp   Scalp location:  R parietal   Length (cm):  1.5 Repair type:    Repair type:  Simple Exploration:    Hemostasis achieved with:  Direct pressure Treatment:    Area cleansed with:  Soap and water Skin repair:    Repair method:  Staples    Number of staples:  2 Approximation:    Approximation:  Close Post-procedure details:    Dressing:  Open (no dressing)   Patient tolerance of procedure:  Tolerated well, no immediate complications   (including critical care time)  Medications Ordered in UC Medications - No data to display  Initial Impression / Assessment and Plan / UC Course  I have reviewed the triage vital signs and the nursing notes.  Pertinent labs & imaging results that were available during my care of the patient were reviewed by me and considered in my medical decision making (see chart for details).    68 year old female presents with a fall/head injury and a scalp laceration.  Laceration repaired as above.  CT head obtained given presentation and age.  CT was negative.  Tylenol as needed.  Supportive care.  Staples out in 7 days.  Final Clinical Impressions(s) / UC Diagnoses   Final diagnoses:  Minor head injury, initial encounter  Laceration of scalp, initial encounter  Discharge Instructions     Return for suture removal in 7 days.  Tylenol as needed for headache.  Take care  Dr. Lacinda Axon    ED Prescriptions    None     Controlled Substance Prescriptions [place] Controlled Substance Registry consulted? Not Applicable   Coral Spikes, DO 06/09/19 1022

## 2019-06-09 NOTE — Discharge Instructions (Signed)
Return for suture removal in 7 days.  Tylenol as needed for headache.  Take care  Dr. Lacinda Axon

## 2019-06-10 ENCOUNTER — Encounter: Payer: Self-pay | Admitting: Obstetrics and Gynecology

## 2019-06-10 ENCOUNTER — Other Ambulatory Visit: Payer: Self-pay

## 2019-06-10 ENCOUNTER — Ambulatory Visit (INDEPENDENT_AMBULATORY_CARE_PROVIDER_SITE_OTHER): Payer: Medicare Other | Admitting: Obstetrics and Gynecology

## 2019-06-10 ENCOUNTER — Telehealth: Payer: Self-pay | Admitting: Emergency Medicine

## 2019-06-10 VITALS — BP 132/84 | HR 80 | Temp 98.1°F | Wt 144.0 lb

## 2019-06-10 DIAGNOSIS — Z4689 Encounter for fitting and adjustment of other specified devices: Secondary | ICD-10-CM | POA: Diagnosis not present

## 2019-06-10 NOTE — Telephone Encounter (Signed)
Called patient insurance company for retro authorization for CT.  Verdis Frederickson at [facility] stated this patient does not require a prior authorization for this test.  Ref. #[id number].

## 2019-06-17 ENCOUNTER — Other Ambulatory Visit: Payer: Self-pay

## 2019-06-17 ENCOUNTER — Ambulatory Visit
Admission: EM | Admit: 2019-06-17 | Discharge: 2019-06-17 | Disposition: A | Discharge status: Home / Self Care | Payer: Medicare Other

## 2019-06-17 DIAGNOSIS — S0101XS Laceration without foreign body of scalp, sequela: Secondary | ICD-10-CM | POA: Diagnosis not present

## 2019-06-17 DIAGNOSIS — Z4802 Encounter for removal of sutures: Secondary | ICD-10-CM | POA: Diagnosis not present

## 2019-06-17 NOTE — ED Triage Notes (Signed)
Patient is here for staple removal. States that she has had no issues.

## 2019-06-17 NOTE — ED Notes (Signed)
2 staples removed without issue.

## 2019-06-24 ENCOUNTER — Other Ambulatory Visit: Payer: Self-pay

## 2019-07-09 ENCOUNTER — Other Ambulatory Visit: Payer: Self-pay

## 2019-07-09 NOTE — Progress Notes (Signed)
GYNECOLOGY  VISIT   HPI: 68 y.o.   Married White or Caucasian Not Hispanic or Latino  female   [id number] with No LMP recorded. Patient is postmenopausal.   here for pessary check. She has a cystocele and uterine prolapse that are controlled with the #5 ring pessary with support. She has had issues with vaginal irritation from prior pessaries. At her visit last month she had slight vaginal erythema. She uses vaginal estrogen.  She feels well with this pessary. Her back pain has resolved with using the pessary. No vaginal bleeding or vaginal discharge.  She has been having issues with constipation. She is on stool softeners and a nightly laxative. Last BM was 4 days ago.  GYNECOLOGIC HISTORY: No LMP recorded. Patient is postmenopausal. Contraception:Postmenopausal Menopausal hormone therapy: Estradiol vaginal cream        OB History    Gravida  1   Para  1   Term  1   Preterm      AB      Living  1     SAB      TAB      Ectopic      Multiple      Live Births  1              Patient Active Problem List   Diagnosis Date Noted  . Chronic midline low back pain without sciatica 04/27/2019  . Allergic rhinitis due to pollen 04/27/2019  . Essential hypertension, benign 04/27/2019  . GAD (generalized anxiety disorder) 09/01/2018  . Chronic migraine without aura without status migrainosus, not intractable 09/01/2018  . Encounter for long-term (current) use of medications 09/01/2018  . Asthma without status asthmaticus 01/30/2018  . Hyperlipidemia, unspecified 01/30/2018  . Hypertension 01/30/2018  . Obesity, unspecified 01/30/2018  . Hypothyroidism 01/30/2018  . Acute respiratory failure with hypoxia ([place]) 12/31/2017  . Fatty infiltration of liver 12/10/2015  . Abdominal pain, LLQ (left lower quadrant) 11/03/2015  . History of adenomatous polyp of colon 11/03/2015  . Hallux valgus, left 09/18/2014  . 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  . Shoulder arthritis  12/19/2013    Past Medical History:  Diagnosis Date  . Allergic rhinitis   . Anxiety   . Asthma   . Blood transfusion without reported diagnosis   . Depression   . Diverticulosis   . Dyspnea   . Esophagitis   . GERD (gastroesophageal reflux disease)   . Headache   . Heart murmur   . Hyperlipemia   . Hypertension   . Hypothyroidism   . Obesity   . Osteopenia   . Palpitations   . Pneumothorax   . Sleep apnea     Past Surgical History:  Procedure Laterality Date  . BUNIONECTOMY    . CATARACT EXTRACTION W/PHACO Right 10/11/2016   Procedure: CATARACT EXTRACTION PHACO AND INTRAOCULAR LENS PLACEMENT (IOC);  Surgeon: Birder Robson, MD;  Location: [facility] ORS;  Service: Ophthalmology;  Laterality: Right;  Lot# [id number] H [place]: 00:37.7 AP%: 18.1 CDE: 6.80  . CATARACT EXTRACTION W/PHACO Left 11/08/2016   Procedure: CATARACT EXTRACTION PHACO AND INTRAOCULAR LENS PLACEMENT (IOC);  Surgeon: Birder Robson, MD;  Location: [facility] ORS;  Service: Ophthalmology;  Laterality: Left;  PACK LOT: [id number] H US:00:32 AP:44 CDE:6.46  . COLONOSCOPY    . COLONOSCOPY WITH PROPOFOL N/A 11/26/2015   Procedure: COLONOSCOPY WITH PROPOFOL;  Surgeon: Manya Silvas, MD;  Location: [facility];  Service: Endoscopy;  Laterality: N/A;  . EYE SURGERY    . TONSILLECTOMY  Current Outpatient Medications  Medication Sig Dispense Refill  . acetaminophen (TYLENOL) 500 MG tablet Take 1,000 mg by mouth 2 (two) times daily as needed for mild pain or moderate pain.    Marland Kitchen albuterol (VENTOLIN HFA) 108 (90 Base) MCG/ACT inhaler INHALE 2 PUFFS BY MOUTH EVERY 6 HOURS 18 g 5  . ALPRAZolam (XANAX) 0.25 MG tablet Take 1 tablet (0.25 mg total) by mouth 2 (two) times daily as needed for anxiety. 60 tablet 2  . amLODipine (NORVASC) 5 MG tablet Take 1 tablet (5 mg total) by mouth daily. 90 tablet 1  . ATROVENT HFA 17 MCG/ACT inhaler INHALE 2 PUFFS 4 TIMES DAILY 12.9 Inhaler 5  . bisacodyl (DULCOLAX) 5 MG EC tablet Take 5-10  mg by mouth at bedtime as needed for moderate constipation. Two at night    . cetirizine (ZYRTEC) 10 MG tablet Take 10 mg by mouth daily.    . cholecalciferol (VITAMIN D) 1000 units tablet Take 1,000 Units by mouth at bedtime.    . clindamycin (CLEOCIN T) 1 % external solution APPLY 1 APPLICATION TOPICALLY 2 (TWO) TIMES DAILY AS NEEDED. FOR SKIN BREAKDOWN 30 mL 2  . cyclobenzaprine (FLEXERIL) 5 MG tablet Take 1-2 tablets (5-10 mg total) by mouth 2 (two) times daily as needed for muscle spasms. 120 tablet 2  . docusate sodium (COLACE) 100 MG capsule Take 100-200 mg by mouth at bedtime as needed for mild constipation or moderate constipation.     Marland Kitchen estradiol (ESTRACE) 0.1 MG/GM vaginal cream INSERT 1 GRAM VAGINALLY TWICE WEEKLY AT BEDTIME 42.5 g 1  . fenofibrate (TRICOR) 145 MG tablet Take 1 tablet (145 mg total) by mouth daily. 30 tablet 5  . FLOVENT HFA 110 MCG/ACT inhaler TAKE 1 PUFF BY MOUTH TWICE A DAY 12 Inhaler 3  . FLUoxetine (PROZAC) 40 MG capsule TAKE 1 CAPSULE BY MOUTH EVERY DAY 90 capsule 1  . HYDROcodone-Acetaminophen 5-300 MG TABS Take 1 tablet by mouth 2 (two) times daily as needed. For severe pain 15 each 0  . ipratropium (ATROVENT) 0.02 % nebulizer solution Inhale into the lungs.    . isometheptene-acetaminophen-dichloralphenazone (MIDRIN) 65-100-325 MG capsule Take 1 capsule by mouth 4 (four) times daily as needed for migraine. Maximum 5 capsules in 12 hours for migraine headaches, 8 capsules in 24 hours for tension headaches.    . levothyroxine (SYNTHROID, LEVOTHROID) 100 MCG tablet TAKE 1 TABLET BY MOUTH EVERY DAY IN THE MORNING 90 tablet 3  . lisinopril (PRINIVIL,ZESTRIL) 10 MG tablet TAKE 1 TABLET BY MOUTH EVERY DAY 90 tablet 1  . magnesium oxide (MAG-OX) 400 MG tablet Take 400 mg by mouth at bedtime.     . montelukast (SINGULAIR) 10 MG tablet TAKE 1 TABLET BY MOUTH EVERY DAY 90 tablet 1  . omeprazole (PRILOSEC) 40 MG capsule TAKE ONE CAPSULE BY MOUTH DAILY 90 capsule 3  .  triamcinolone cream (KENALOG) 0.1 % Apply 1 application topically 2 (two) times daily as needed. For skin breakdown    . vitamin B-12 (CYANOCOBALAMIN) 1000 MCG tablet Take 1,000 mcg by mouth daily.     No current facility-administered medications for this visit.      ALLERGIES: Advair diskus [fluticasone-salmeterol], Codeine, Erythromycin, Morphine and related, Nsaids, and Sulfa antibiotics  Family History  Problem Relation Age of Onset  . Breast cancer Maternal Grandmother   . Stroke Maternal Grandmother   . Bladder Cancer Father   . Prostate cancer Father   . Heart attack Father   . Aortic aneurysm  Father   . Congestive Heart Failure Father   . Colon cancer Paternal Grandmother   . Aneurysm Paternal Grandmother   . Stroke Paternal Grandmother   . Congestive Heart Failure Paternal Grandmother   . Dementia Mother   . Stroke Mother   . Dementia Maternal Grandfather   . Stroke Maternal Grandfather   . Dementia Paternal Grandfather   . Stroke Paternal Grandfather     Social History   Socioeconomic History  . Marital status: Married    Spouse name: Not on file  . Number of children: Not on file  . Years of education: Not on file  . Highest education level: Not on file  Occupational History  . Not on file  Social Needs  . Financial resource strain: Not on file  . Food insecurity    Worry: Not on file    Inability: Not on file  . Transportation needs    Medical: Not on file    Non-medical: Not on file  Tobacco Use  . Smoking status: Former Research scientist (life sciences)  . Smokeless tobacco: Never Used  Substance and Sexual Activity  . Alcohol use: No  . Drug use: No  . Sexual activity: Not Currently    Partners: Male    Birth control/protection: Post-menopausal  Lifestyle  . Physical activity    Days per week: Not on file    Minutes per session: Not on file  . Stress: Not on file  Relationships  . Social Herbalist on phone: Not on file    Gets together: Not on file     Attends religious service: Not on file    Active member of club or organization: Not on file    Attends meetings of clubs or organizations: Not on file    Relationship status: Not on file  . Intimate partner violence    Fear of current or ex partner: Not on file    Emotionally abused: Not on file    Physically abused: Not on file    Forced sexual activity: Not on file  Other Topics Concern  . Not on file  Social History Narrative  . Not on file    Review of Systems  Constitutional: Negative.   HENT: Negative.   Eyes: Negative.   Respiratory: Negative.   Cardiovascular: Negative.   Gastrointestinal: Negative.   Genitourinary: Negative.   Musculoskeletal: Negative.   Skin: Negative.   Neurological: Negative.   Endo/Heme/Allergies: Negative.   Psychiatric/Behavioral: Negative.     PHYSICAL EXAMINATION:    BP 126/74 (BP Location: Right Arm, Patient Position: Sitting, Cuff Size: Normal)   Pulse 80   Temp 97.6 F (36.4 C) (Skin)   Wt 143 lb 9.6 oz (65.1 kg)   BMI 27.13 kg/m     General appearance: alert, cooperative and appears stated age  Pelvic: External genitalia:  no lesions              Urethra:  normal appearing urethra with no masses, tenderness or lesions              Bartholins and Skenes: normal                 Vagina: the pessary was removed and cleaned. There is some erythema on the posterior vaginal apex, ~3 x 1 cm. Not friable, no lacerations, no granulation tissue. 1 gram of estrace cream placed. Pessary replaced.               Cervix:  no lesions  Chaperone was present for exam.  ASSESSMENT Uterine prolapse and cystocele, well controlled with the pessary, but some mild vaginal irritation.  Constipation, she will f/u with her primary    PLAN F/U in one month Continue with 2 x a week vaginal estrogen Call with any concerns   An After Visit Summary was printed and given to the patient.

## 2019-07-11 ENCOUNTER — Ambulatory Visit (INDEPENDENT_AMBULATORY_CARE_PROVIDER_SITE_OTHER): Payer: Medicare Other | Admitting: Obstetrics and Gynecology

## 2019-07-11 ENCOUNTER — Other Ambulatory Visit: Payer: Self-pay

## 2019-07-11 ENCOUNTER — Encounter: Payer: Self-pay | Admitting: Obstetrics and Gynecology

## 2019-07-11 VITALS — BP 126/74 | HR 80 | Temp 97.6°F | Wt 143.6 lb

## 2019-07-11 DIAGNOSIS — N8111 Cystocele, midline: Secondary | ICD-10-CM

## 2019-07-11 DIAGNOSIS — N814 Uterovaginal prolapse, unspecified: Secondary | ICD-10-CM

## 2019-07-11 DIAGNOSIS — Z4689 Encounter for fitting and adjustment of other specified devices: Secondary | ICD-10-CM | POA: Diagnosis not present

## 2019-07-15 ENCOUNTER — Encounter: Payer: Self-pay | Admitting: Nurse Practitioner

## 2019-07-15 ENCOUNTER — Ambulatory Visit (INDEPENDENT_AMBULATORY_CARE_PROVIDER_SITE_OTHER): Payer: Medicare Other | Admitting: Nurse Practitioner

## 2019-07-15 ENCOUNTER — Other Ambulatory Visit: Payer: Self-pay

## 2019-07-15 VITALS — BP 133/82 | HR 80 | Resp 16 | Ht 61.0 in | Wt 142.2 lb

## 2019-07-15 DIAGNOSIS — K581 Irritable bowel syndrome with constipation: Secondary | ICD-10-CM

## 2019-07-15 DIAGNOSIS — H60502 Unspecified acute noninfective otitis externa, left ear: Secondary | ICD-10-CM | POA: Diagnosis not present

## 2019-07-15 DIAGNOSIS — F411 Generalized anxiety disorder: Secondary | ICD-10-CM

## 2019-07-15 DIAGNOSIS — J4540 Moderate persistent asthma, uncomplicated: Secondary | ICD-10-CM | POA: Diagnosis not present

## 2019-07-15 DIAGNOSIS — M545 Low back pain: Secondary | ICD-10-CM

## 2019-07-15 DIAGNOSIS — G8929 Other chronic pain: Secondary | ICD-10-CM

## 2019-07-15 MED ORDER — PREDNISONE 10 MG (21) PO TBPK: ORAL_TABLET | ORAL | 0 refills | Status: DC

## 2019-07-15 MED ORDER — ALPRAZOLAM 0.25 MG PO TABS: 0.2500 mg | ORAL_TABLET | Freq: Two times a day (BID) | ORAL | 2 refills | Status: DC | PRN

## 2019-07-15 MED ORDER — ALBUTEROL SULFATE (2.5 MG/3ML) 0.083% IN NEBU: 2.5000 mg | INHALATION_SOLUTION | Freq: Four times a day (QID) | RESPIRATORY_TRACT | 3 refills | Status: DC | PRN

## 2019-07-15 MED ORDER — HYDROCODONE-ACETAMINOPHEN 5-300 MG PO TABS: 1.0000 | ORAL_TABLET | Freq: Two times a day (BID) | ORAL | 0 refills | Status: DC | PRN

## 2019-07-15 MED ORDER — IPRATROPIUM BROMIDE 0.02 % IN SOLN: RESPIRATORY_TRACT | 3 refills | Status: DC

## 2019-07-15 MED ORDER — POLYETHYLENE GLYCOL 3350 17 GM/SCOOP PO POWD: 17.0000 g | Freq: Every evening | ORAL | 3 refills | Status: AC

## 2019-07-15 MED ORDER — CIPRODEX 0.3-0.1 % OT SUSP: 4.0000 [drp] | Freq: Two times a day (BID) | OTIC | 0 refills | Status: DC

## 2019-07-15 NOTE — Progress Notes (Signed)
[facility] [place], [place] [address]  Internal MEDICINE  Office Visit Note  Patient Name: Bonnie West  [id number]  [id number]  Date of Service: 07/15/2019  Chief Complaint  Patient presents with  . Asthma    pt asthma has been flaring up   . Hypertension  . Hypothyroidism    The patient is here for for routine follow up visit. She is complaining of increased wheezing for past few days. She has been using her rescue inhaler more often. Feeling as though she may have to start using nebulizer. She states that when the weather starts to change, she has increased in asthma symptoms and asthma attacks.  She is also stating that she has been having issues with her pessary. Has been having to go about once a month to her GYN provider. Was told that she has spot where pessary is rubbing against the intestines. She has been having increased constipation. Takes ducolax stool softener every day. GYN provider is wanting to know if there is anything else she can take to help improve her constipation.  She is also concerned about having wax build up in the left ear. She has increase in difficulty hearing. Would like to have this checked out.     Current Medication: Outpatient Encounter Medications as of 07/15/2019  Medication Sig  . acetaminophen (TYLENOL) 500 MG tablet Take 1,000 mg by mouth 2 (two) times daily as needed for mild pain or moderate pain.  Marland Kitchen albuterol (VENTOLIN HFA) 108 (90 Base) MCG/ACT inhaler INHALE 2 PUFFS BY MOUTH EVERY 6 HOURS  . ALPRAZolam (XANAX) 0.25 MG tablet Take 1 tablet (0.25 mg total) by mouth 2 (two) times daily as needed for anxiety.  Marland Kitchen amLODipine (NORVASC) 5 MG tablet Take 1 tablet (5 mg total) by mouth daily.  . ATROVENT HFA 17 MCG/ACT inhaler INHALE 2 PUFFS 4 TIMES DAILY  . bisacodyl (DULCOLAX) 5 MG EC tablet Take 5-10 mg by mouth at bedtime as needed for moderate constipation. Two at night  . cetirizine (ZYRTEC) 10 MG tablet Take 10 mg  by mouth daily.  . cholecalciferol (VITAMIN D) 1000 units tablet Take 1,000 Units by mouth at bedtime.  . clindamycin (CLEOCIN T) 1 % external solution APPLY 1 APPLICATION TOPICALLY 2 (TWO) TIMES DAILY AS NEEDED. FOR SKIN BREAKDOWN  . cyclobenzaprine (FLEXERIL) 5 MG tablet Take 1-2 tablets (5-10 mg total) by mouth 2 (two) times daily as needed for muscle spasms.  Marland Kitchen docusate sodium (COLACE) 100 MG capsule Take 100-200 mg by mouth at bedtime as needed for mild constipation or moderate constipation.   Marland Kitchen estradiol (ESTRACE) 0.1 MG/GM vaginal cream INSERT 1 GRAM VAGINALLY TWICE WEEKLY AT BEDTIME  . fenofibrate (TRICOR) 145 MG tablet Take 1 tablet (145 mg total) by mouth daily.  Marland Kitchen FLOVENT HFA 110 MCG/ACT inhaler TAKE 1 PUFF BY MOUTH TWICE A DAY  . FLUoxetine (PROZAC) 40 MG capsule TAKE 1 CAPSULE BY MOUTH EVERY DAY  . HYDROcodone-Acetaminophen 5-300 MG TABS Take 1 tablet by mouth 2 (two) times daily as needed. For severe pain  . ipratropium (ATROVENT) 0.02 % nebulizer solution Use 1 vial nebulized QID prn shortness of breath/wheezing.  . isometheptene-acetaminophen-dichloralphenazone (MIDRIN) 65-100-325 MG capsule Take 1 capsule by mouth 4 (four) times daily as needed for migraine. Maximum 5 capsules in 12 hours for migraine headaches, 8 capsules in 24 hours for tension headaches.  . levothyroxine (SYNTHROID, LEVOTHROID) 100 MCG tablet TAKE 1 TABLET BY MOUTH EVERY DAY IN THE MORNING  .  lisinopril (PRINIVIL,ZESTRIL) 10 MG tablet TAKE 1 TABLET BY MOUTH EVERY DAY  . magnesium oxide (MAG-OX) 400 MG tablet Take 400 mg by mouth at bedtime.   . montelukast (SINGULAIR) 10 MG tablet TAKE 1 TABLET BY MOUTH EVERY DAY  . omeprazole (PRILOSEC) 40 MG capsule TAKE ONE CAPSULE BY MOUTH DAILY  . triamcinolone cream (KENALOG) 0.1 % Apply 1 application topically 2 (two) times daily as needed. For skin breakdown  . vitamin B-12 (CYANOCOBALAMIN) 1000 MCG tablet Take 1,000 mcg by mouth daily.  . [DISCONTINUED] ALPRAZolam  (XANAX) 0.25 MG tablet Take 1 tablet (0.25 mg total) by mouth 2 (two) times daily as needed for anxiety.  . [DISCONTINUED] HYDROcodone-Acetaminophen 5-300 MG TABS Take 1 tablet by mouth 2 (two) times daily as needed. For severe pain  . [DISCONTINUED] ipratropium (ATROVENT) 0.02 % nebulizer solution Inhale into the lungs.  Marland Kitchen albuterol (PROVENTIL) (2.5 MG/3ML) 0.083% nebulizer solution Take 3 mLs (2.5 mg total) by nebulization every 6 (six) hours as needed for wheezing or shortness of breath.  . ciprofloxacin-dexamethasone (CIPRODEX) OTIC suspension Place 4 drops into the left ear 2 (two) times daily.  . polyethylene glycol powder (GLYCOLAX/MIRALAX) 17 GM/SCOOP powder Take 17 g by mouth every evening.  . predniSONE (STERAPRED UNI-PAK 21 TAB) 10 MG (21) TBPK tablet 6 day taper - take by mouth as directed for 6 days   No facility-administered encounter medications on file as of 07/15/2019.     Surgical History: Past Surgical History:  Procedure Laterality Date  . BUNIONECTOMY    . CATARACT EXTRACTION W/PHACO Right 10/11/2016   Procedure: CATARACT EXTRACTION PHACO AND INTRAOCULAR LENS PLACEMENT (IOC);  Surgeon: Birder Robson, MD;  Location: [facility] ORS;  Service: Ophthalmology;  Laterality: Right;  Lot# [id number] H [place]: 00:37.7 AP%: 18.1 CDE: 6.80  . CATARACT EXTRACTION W/PHACO Left 11/08/2016   Procedure: CATARACT EXTRACTION PHACO AND INTRAOCULAR LENS PLACEMENT (IOC);  Surgeon: Birder Robson, MD;  Location: [facility] ORS;  Service: Ophthalmology;  Laterality: Left;  PACK LOT: [id number] H US:00:32 AP:44 CDE:6.46  . COLONOSCOPY    . COLONOSCOPY WITH PROPOFOL N/A 11/26/2015   Procedure: COLONOSCOPY WITH PROPOFOL;  Surgeon: Manya Silvas, MD;  Location: [facility] ENDOSCOPY;  Service: Endoscopy;  Laterality: N/A;  . EYE SURGERY    . TONSILLECTOMY      Medical History: Past Medical History:  Diagnosis Date  . Allergic rhinitis   . Anxiety   . Asthma   . Blood transfusion without reported diagnosis   .  Depression   . Diverticulosis   . Dyspnea   . Esophagitis   . GERD (gastroesophageal reflux disease)   . Headache   . Heart murmur   . Hyperlipemia   . Hypertension   . Hypothyroidism   . Obesity   . Osteopenia   . Palpitations   . Pneumothorax   . Sleep apnea     Family History: Family History  Problem Relation Age of Onset  . Breast cancer Maternal Grandmother   . Stroke Maternal Grandmother   . Bladder Cancer Father   . Prostate cancer Father   . Heart attack Father   . Aortic aneurysm Father   . Congestive Heart Failure Father   . Colon cancer Paternal Grandmother   . Aneurysm Paternal Grandmother   . Stroke Paternal Grandmother   . Congestive Heart Failure Paternal Grandmother   . Dementia Mother   . Stroke Mother   . Dementia Maternal Grandfather   . Stroke Maternal Grandfather   . Dementia Paternal Grandfather   .  Stroke Paternal Grandfather     Social History   Socioeconomic History  . Marital status: Married    Spouse name: Not on file  . Number of children: Not on file  . Years of education: Not on file  . Highest education level: Not on file  Occupational History  . Not on file  Social Needs  . Financial resource strain: Not on file  . Food insecurity    Worry: Not on file    Inability: Not on file  . Transportation needs    Medical: Not on file    Non-medical: Not on file  Tobacco Use  . Smoking status: Former Research scientist (life sciences)  . Smokeless tobacco: Never Used  Substance and Sexual Activity  . Alcohol use: No  . Drug use: No  . Sexual activity: Not Currently    Partners: Male    Birth control/protection: Post-menopausal  Lifestyle  . Physical activity    Days per week: Not on file    Minutes per session: Not on file  . Stress: Not on file  Relationships  . Social Herbalist on phone: Not on file    Gets together: Not on file    Attends religious service: Not on file    Active member of club or organization: Not on file    Attends  meetings of clubs or organizations: Not on file    Relationship status: Not on file  . Intimate partner violence    Fear of current or ex partner: Not on file    Emotionally abused: Not on file    Physically abused: Not on file    Forced sexual activity: Not on file  Other Topics Concern  . Not on file  Social History Narrative  . Not on file      Review of Systems  Constitutional: Negative for chills, fatigue and unexpected weight change.  HENT: Negative for congestion, postnasal drip, rhinorrhea, sneezing and sore throat.        Increased hearing loss in left ear. Concern for excess ear wax development.   Respiratory: Positive for shortness of breath and wheezing. Negative for chest tightness.   Cardiovascular: Negative for chest pain and palpitations.  Gastrointestinal: Positive for constipation. Negative for abdominal pain, diarrhea, nausea and vomiting.  Endocrine: Negative for cold intolerance, heat intolerance, polydipsia and polyuria.  Genitourinary: Negative for dysuria and frequency.       Bladder prolapse for which she sees GYN provider  Musculoskeletal: Negative for arthralgias, back pain, joint swelling and neck pain.  Skin: Negative for rash.  Allergic/Immunologic: Positive for environmental allergies.  Neurological: Positive for dizziness and headaches. Negative for tremors and numbness.  Hematological: Negative for adenopathy. Does not bruise/bleed easily.  Psychiatric/Behavioral: Negative for behavioral problems (Depression), sleep disturbance and suicidal ideas. The patient is nervous/anxious.     Today's Vitals   07/15/19 1404  BP: 133/82  Pulse: 80  Resp: 16  SpO2: 98%  Weight: 142 lb 3.2 oz (64.5 kg)  Height: 5\' 1"  (1.549 m)   Body mass index is 26.87 kg/m.  Physical Exam Vitals signs and nursing note reviewed.  Constitutional:      General: She is not in acute distress.    Appearance: Normal appearance. She is well-developed. She is not  diaphoretic.  HENT:     Head: Normocephalic and atraumatic.     Right Ear: Tympanic membrane, ear canal and external ear normal.     Left Ear: Tenderness present. Tympanic membrane is  erythematous and bulging.     Nose: Nose normal.     Mouth/Throat:     Pharynx: No oropharyngeal exudate.  Eyes:     Conjunctiva/sclera: Conjunctivae normal.     Pupils: Pupils are equal, round, and reactive to light.  Neck:     Musculoskeletal: Normal range of motion and neck supple.     Thyroid: No thyromegaly.     Vascular: No JVD.     Trachea: No tracheal deviation.  Cardiovascular:     Rate and Rhythm: Normal rate and regular rhythm.     Heart sounds: Normal heart sounds. No murmur. No friction rub. No gallop.   Pulmonary:     Effort: Pulmonary effort is normal. No respiratory distress.     Breath sounds: Normal breath sounds. No wheezing or rales.     Comments: Mild, intermittent, congested cough noted.  Chest:     Chest wall: No tenderness.  Abdominal:     General: Bowel sounds are normal.     Palpations: Abdomen is soft.     Tenderness: There is no abdominal tenderness.  Musculoskeletal: Normal range of motion.  Lymphadenopathy:     Cervical: No cervical adenopathy.  Skin:    General: Skin is warm and dry.     Comments: Bruise on right breast   Neurological:     Mental Status: She is alert and oriented to person, place, and time.     Cranial Nerves: No cranial nerve deficit.  Psychiatric:        Behavior: Behavior normal.        Thought Content: Thought content normal.        Judgment: Judgment normal.   Assessment/Plan: 1. Moderate persistent asthma without status asthmaticus without complication Add prednisone taper. Take as directed for 6 days. Add nebulizer solution for albuterol and atrovent. Use both as needed and as prescribed for wheezing and shortness of breath which does not improve with inhalers. Continue maintenance inhalers as prescribed  - ipratropium (ATROVENT) 0.02 %  nebulizer solution; Use 1 vial nebulized QID prn shortness of breath/wheezing.  Dispense: 75 mL; Refill: 3 - albuterol (PROVENTIL) (2.5 MG/3ML) 0.083% nebulizer solution; Take 3 mLs (2.5 mg total) by nebulization every 6 (six) hours as needed for wheezing or shortness of breath.  Dispense: 150 mL; Refill: 3 - predniSONE (STERAPRED UNI-PAK 21 TAB) 10 MG (21) TBPK tablet; 6 day taper - take by mouth as directed for 6 days  Dispense: 21 tablet; Refill: 0  2. Acute otitis externa of left ear, unspecified type ciprodex ear drops. Use four drops in left ear twice daily for next 7 days  - ciprofloxacin-dexamethasone (CIPRODEX) OTIC suspension; Place 4 drops into the left ear 2 (two) times daily.  Dispense: 7.5 mL; Refill: 0  3. Chronic midline low back pain without sciatica Short term prescription for vicodin 5/300mg  given. Use only as needed and as prescribed  - HYDROcodone-Acetaminophen 5-300 MG TABS; Take 1 tablet by mouth 2 (two) times daily as needed. For severe pain  Dispense: 15 tablet; Refill: 0  4. Irritable bowel syndrome with constipation Add miralax, mixed with full glass of water, every evening. Continue stool softeners as before.  - polyethylene glycol powder (GLYCOLAX/MIRALAX) 17 GM/SCOOP powder; Take 17 g by mouth every evening.  Dispense: 3350 g; Refill: 3  5. GAD (generalized anxiety disorder) maytake alprazolam 0.25mg  twice daily as needed for acute anxiety. New prescription sent to her pharmacy today.  - ALPRAZolam (XANAX) 0.25 MG tablet; Take 1 tablet (  0.25 mg total) by mouth 2 (two) times daily as needed for anxiety.  Dispense: 60 tablet; Refill: 2  General Counseling: Deshonna verbalizes understanding of the findings of todays visit and agrees with plan of treatment. I have discussed any further diagnostic evaluation that may be needed or ordered today. We also reviewed her medications today. she has been encouraged to call the office with any questions or concerns that should arise  related to todays visit.  This patient was seen by [facility] with Dr Lavera Guise as a part of collaborative care agreement  Meds ordered this encounter  Medications  . ipratropium (ATROVENT) 0.02 % nebulizer solution    Sig: Use 1 vial nebulized QID prn shortness of breath/wheezing.    Dispense:  75 mL    Refill:  3    Order Specific Question:   Supervising Provider    Answer:   Lavera Guise [id number]  . HYDROcodone-Acetaminophen 5-300 MG TABS    Sig: Take 1 tablet by mouth 2 (two) times daily as needed. For severe pain    Dispense:  15 tablet    Refill:  0    Order Specific Question:   Supervising Provider    Answer:   Lavera Guise [id number]  . albuterol (PROVENTIL) (2.5 MG/3ML) 0.083% nebulizer solution    Sig: Take 3 mLs (2.5 mg total) by nebulization every 6 (six) hours as needed for wheezing or shortness of breath.    Dispense:  150 mL    Refill:  3    Use 1 vial nebulized, combined withatrovent neb solution.    Order Specific Question:   Supervising Provider    Answer:   Lavera Guise [id number]  . predniSONE (STERAPRED UNI-PAK 21 TAB) 10 MG (21) TBPK tablet    Sig: 6 day taper - take by mouth as directed for 6 days    Dispense:  21 tablet    Refill:  0    Order Specific Question:   Supervising Provider    Answer:   Lavera Guise [place]  . polyethylene glycol powder (GLYCOLAX/MIRALAX) 17 GM/SCOOP powder    Sig: Take 17 g by mouth every evening.    Dispense:  3350 g    Refill:  3    Order Specific Question:   Supervising Provider    Answer:   Lavera Guise [id number]  . ALPRAZolam (XANAX) 0.25 MG tablet    Sig: Take 1 tablet (0.25 mg total) by mouth 2 (two) times daily as needed for anxiety.    Dispense:  60 tablet    Refill:  2    Order Specific Question:   Supervising Provider    Answer:   Lavera Guise [id number]  . ciprofloxacin-dexamethasone (CIPRODEX) OTIC suspension    Sig: Place 4 drops into the left ear 2 (two) times daily.    Dispense:  7.5 mL     Refill:  0    Order Specific Question:   Supervising Provider    Answer:   Lavera Guise [id number]    Time spent: 86 Minutes      Dr Lavera Guise Internal medicine

## 2019-07-18 ENCOUNTER — Other Ambulatory Visit: Payer: Self-pay

## 2019-07-18 MED ORDER — FENOFIBRATE 145 MG PO TABS: 145.0000 mg | ORAL_TABLET | Freq: Every day | ORAL | 5 refills | Status: DC

## 2019-07-30 ENCOUNTER — Other Ambulatory Visit: Payer: Self-pay | Admitting: Nurse Practitioner

## 2019-07-30 MED ORDER — LISINOPRIL 10 MG PO TABS: 10.0000 mg | ORAL_TABLET | Freq: Every day | ORAL | 2 refills | Status: DC

## 2019-08-01 DIAGNOSIS — L237 Allergic contact dermatitis due to plants, except food: Secondary | ICD-10-CM | POA: Diagnosis not present

## 2019-08-07 ENCOUNTER — Other Ambulatory Visit: Payer: Self-pay | Admitting: Internal Medicine

## 2019-08-12 ENCOUNTER — Other Ambulatory Visit: Payer: Self-pay

## 2019-08-13 NOTE — Progress Notes (Signed)
GYNECOLOGY  VISIT   HPI: 68 y.o.   Married White or Caucasian Not Hispanic or Latino  female   G1P1001 with No LMP recorded. Patient is postmenopausal.  here for pessary check. She has a uterine prolapse, well controlled with the pessary, but she has had issues with vaginal irritation. Using her estrace cream 2 x a week.  She is working on getting her constipation under control. It's improving.  She feels so much better with the pessary in than out. When it is out the prolapse is very bothersome and she has back pain.  She would like to be sexually active, but isn't secondary to ED (husband with h/o prostate cancer)   GYNECOLOGIC HISTORY: No LMP recorded. Patient is postmenopausal. Contraception: Postmenopausal Menopausal hormone therapy: Estradiol vaginal cream        OB History    Gravida  1   Para  1   Term  1   Preterm      AB      Living  1     SAB      TAB      Ectopic      Multiple      Live Births  1              Patient Active Problem List   Diagnosis Date Noted  . Acute otitis externa of left ear 07/15/2019  . Irritable bowel syndrome with constipation 07/15/2019  . Chronic midline low back pain without sciatica 04/27/2019  . Allergic rhinitis due to pollen 04/27/2019  . Essential hypertension, benign 04/27/2019  . GAD (generalized anxiety disorder) 09/01/2018  . Chronic migraine without aura without status migrainosus, not intractable 09/01/2018  . Encounter for long-term (current) use of medications 09/01/2018  . Asthma without status asthmaticus 01/30/2018  . Hyperlipidemia, unspecified 01/30/2018  . Hypertension 01/30/2018  . Obesity, unspecified 01/30/2018  . Hypothyroidism 01/30/2018  . Acute respiratory failure with hypoxia ([place]) 12/31/2017  . Fatty infiltration of liver 12/10/2015  . Abdominal pain, LLQ (left lower quadrant) 11/03/2015  . History of adenomatous polyp of colon 11/03/2015  . Hallux valgus, left 09/18/2014  .  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  . Shoulder arthritis 12/19/2013    Past Medical History:  Diagnosis Date  . Allergic rhinitis   . Anxiety   . Asthma   . Blood transfusion without reported diagnosis   . Depression   . Diverticulosis   . Dyspnea   . Esophagitis   . GERD (gastroesophageal reflux disease)   . Headache   . Heart murmur   . Hyperlipemia   . Hypertension   . Hypothyroidism   . Obesity   . Osteopenia   . Palpitations   . Pneumothorax   . Sleep apnea     Past Surgical History:  Procedure Laterality Date  . BUNIONECTOMY    . CATARACT EXTRACTION W/PHACO Right 10/11/2016   Procedure: CATARACT EXTRACTION PHACO AND INTRAOCULAR LENS PLACEMENT (IOC);  Surgeon: Birder Robson, MD;  Location: [facility] ORS;  Service: Ophthalmology;  Laterality: Right;  Lot# [id number] H [place]: 00:37.7 AP%: 18.1 CDE: 6.80  . CATARACT EXTRACTION W/PHACO Left 11/08/2016   Procedure: CATARACT EXTRACTION PHACO AND INTRAOCULAR LENS PLACEMENT (IOC);  Surgeon: Birder Robson, MD;  Location: [facility] ORS;  Service: Ophthalmology;  Laterality: Left;  PACK LOT: [id number] H US:00:32 AP:44 CDE:6.46  . COLONOSCOPY    . COLONOSCOPY WITH PROPOFOL N/A 11/26/2015   Procedure: COLONOSCOPY WITH PROPOFOL;  Surgeon: Manya Silvas, MD;  Location: [facility] ENDOSCOPY;  Service: Endoscopy;  Laterality: N/A;  . EYE SURGERY    . TONSILLECTOMY      Current Outpatient Medications  Medication Sig Dispense Refill  . acetaminophen (TYLENOL) 500 MG tablet Take 1,000 mg by mouth 2 (two) times daily as needed for mild pain or moderate pain.    Marland Kitchen albuterol (PROVENTIL) (2.5 MG/3ML) 0.083% nebulizer solution Take 3 mLs (2.5 mg total) by nebulization every 6 (six) hours as needed for wheezing or shortness of breath. 150 mL 3  . albuterol (VENTOLIN HFA) 108 (90 Base) MCG/ACT inhaler INHALE 2 PUFFS BY MOUTH EVERY 6 HOURS 18 g 5  . ALPRAZolam (XANAX) 0.25 MG tablet Take 1 tablet (0.25 mg total) by mouth 2 (two) times daily as  needed for anxiety. 60 tablet 2  . amLODipine (NORVASC) 5 MG tablet Take 1 tablet (5 mg total) by mouth daily. 90 tablet 1  . ATROVENT HFA 17 MCG/ACT inhaler INHALE 2 PUFFS 4 TIMES DAILY 12.9 Inhaler 5  . bisacodyl (DULCOLAX) 5 MG EC tablet Take 5-10 mg by mouth at bedtime as needed for moderate constipation. Two at night    . cetirizine (ZYRTEC) 10 MG tablet Take 10 mg by mouth daily.    . cholecalciferol (VITAMIN D) 1000 units tablet Take 1,000 Units by mouth at bedtime.    . ciprofloxacin-dexamethasone (CIPRODEX) OTIC suspension Place 4 drops into the left ear 2 (two) times daily. 7.5 mL 0  . clindamycin (CLEOCIN T) 1 % external solution APPLY 1 APPLICATION TOPICALLY 2 (TWO) TIMES DAILY AS NEEDED. FOR SKIN BREAKDOWN 30 mL 2  . cyclobenzaprine (FLEXERIL) 5 MG tablet Take 1-2 tablets (5-10 mg total) by mouth 2 (two) times daily as needed for muscle spasms. 120 tablet 2  . docusate sodium (COLACE) 100 MG capsule Take 100-200 mg by mouth at bedtime as needed for mild constipation or moderate constipation.     Marland Kitchen estradiol (ESTRACE) 0.1 MG/GM vaginal cream INSERT 1 GRAM VAGINALLY TWICE WEEKLY AT BEDTIME 42.5 g 1  . fenofibrate (TRICOR) 145 MG tablet Take 1 tablet (145 mg total) by mouth daily. 30 tablet 5  . FLOVENT HFA 110 MCG/ACT inhaler INHALE 1 PUFF BY MOUTH TWICE A DAY 12 Inhaler 3  . FLUoxetine (PROZAC) 40 MG capsule TAKE 1 CAPSULE BY MOUTH EVERY DAY 90 capsule 1  . HYDROcodone-Acetaminophen 5-300 MG TABS Take 1 tablet by mouth 2 (two) times daily as needed. For severe pain 15 tablet 0  . ipratropium (ATROVENT) 0.02 % nebulizer solution Use 1 vial nebulized QID prn shortness of breath/wheezing. 75 mL 3  . isometheptene-acetaminophen-dichloralphenazone (MIDRIN) 65-100-325 MG capsule Take 1 capsule by mouth 4 (four) times daily as needed for migraine. Maximum 5 capsules in 12 hours for migraine headaches, 8 capsules in 24 hours for tension headaches.    . levothyroxine (SYNTHROID, LEVOTHROID) 100  MCG tablet TAKE 1 TABLET BY MOUTH EVERY DAY IN THE MORNING 90 tablet 3  . lisinopril (ZESTRIL) 10 MG tablet Take 1 tablet (10 mg total) by mouth daily. 90 tablet 2  . magnesium oxide (MAG-OX) 400 MG tablet Take 400 mg by mouth at bedtime.     . montelukast (SINGULAIR) 10 MG tablet TAKE 1 TABLET BY MOUTH EVERY DAY 90 tablet 1  . omeprazole (PRILOSEC) 40 MG capsule TAKE ONE CAPSULE BY MOUTH DAILY 90 capsule 3  . polyethylene glycol powder (GLYCOLAX/MIRALAX) 17 GM/SCOOP powder Take 17 g by mouth every evening. 3350 g 3  . predniSONE (STERAPRED UNI-PAK 21 TAB) 10 MG (21) TBPK  tablet 6 day taper - take by mouth as directed for 6 days 21 tablet 0  . triamcinolone cream (KENALOG) 0.1 % Apply 1 application topically 2 (two) times daily as needed. For skin breakdown    . vitamin B-12 (CYANOCOBALAMIN) 1000 MCG tablet Take 1,000 mcg by mouth daily.     No current facility-administered medications for this visit.      ALLERGIES: Advair diskus [fluticasone-salmeterol], Codeine, Erythromycin, Morphine and related, Nsaids, and Sulfa antibiotics  Family History  Problem Relation Age of Onset  . Breast cancer Maternal Grandmother   . Stroke Maternal Grandmother   . Bladder Cancer Father   . Prostate cancer Father   . Heart attack Father   . Aortic aneurysm Father   . Congestive Heart Failure Father   . Colon cancer Paternal Grandmother   . Aneurysm Paternal Grandmother   . Stroke Paternal Grandmother   . Congestive Heart Failure Paternal Grandmother   . Dementia Mother   . Stroke Mother   . Dementia Maternal Grandfather   . Stroke Maternal Grandfather   . Dementia Paternal Grandfather   . Stroke Paternal Grandfather     Social History   Socioeconomic History  . Marital status: Married    Spouse name: Not on file  . Number of children: Not on file  . Years of education: Not on file  . Highest education level: Not on file  Occupational History  . Not on file  Social Needs  . Financial  resource strain: Not on file  . Food insecurity    Worry: Not on file    Inability: Not on file  . Transportation needs    Medical: Not on file    Non-medical: Not on file  Tobacco Use  . Smoking status: Former Research scientist (life sciences)  . Smokeless tobacco: Never Used  Substance and Sexual Activity  . Alcohol use: No  . Drug use: No  . Sexual activity: Not Currently    Partners: Male    Birth control/protection: Post-menopausal  Lifestyle  . Physical activity    Days per week: Not on file    Minutes per session: Not on file  . Stress: Not on file  Relationships  . Social Herbalist on phone: Not on file    Gets together: Not on file    Attends religious service: Not on file    Active member of club or organization: Not on file    Attends meetings of clubs or organizations: Not on file    Relationship status: Not on file  . Intimate partner violence    Fear of current or ex partner: Not on file    Emotionally abused: Not on file    Physically abused: Not on file    Forced sexual activity: Not on file  Other Topics Concern  . Not on file  Social History Narrative  . Not on file    Review of Systems  Constitutional: Negative.   HENT: Negative.   Eyes: Negative.   Respiratory: Negative.   Cardiovascular: Negative.   Gastrointestinal: Negative.   Genitourinary: Negative.   Musculoskeletal: Negative.   Skin: Negative.   Neurological: Negative.   Endo/Heme/Allergies: Negative.   Psychiatric/Behavioral: Negative.     PHYSICAL EXAMINATION:    BP 122/78 (BP Location: Right Arm, Patient Position: Sitting, Cuff Size: Normal)   Pulse 72   Temp (!) 97.1 F (36.2 C) (Skin)   Wt 143 lb 3.2 oz (65 kg)   BMI 27.06 kg/m  General appearance: alert, cooperative and appears stated age  Pelvic: External genitalia:  no lesions              Urethra:  normal appearing urethra with no masses, tenderness or lesions              Bartholins and Skenes: normal                 Vagina:  the pessary was removed an cleaned. There is an ~ 3 cm area of mild erythema at the right posterior vaginal apex posterior to the cervix. There is a small area within the 3 cm that appears raw. 1 gram of estrace cream placed, pessary replaced             Cervix: nabothian cyst               Chaperone was present for exam.  ASSESSMENT Uterine prolapse and cystocele helped with the pessary, but having issues with vaginal irritation Constipation, just recently improving    PLAN Increase vaginal estrogen to 1 gram 3 x a week Try to avoid heavy lifting or straining F/u in 2 weeks We discussed trying a larger ring pessary, or trying a different pessary. Other option is surgery.   An After Visit Summary was printed and given to the patient.  ~20 minutes face to face time of which over 50% was spent in counseling.   Addendum: I reviewed her prior notes. On prior exams her uterine prolapse was grade 2 (almost 3) and her cystocele was grade 1-2. She is currently using a #5 ring pessary with support

## 2019-08-14 ENCOUNTER — Encounter: Payer: Self-pay | Admitting: Obstetrics and Gynecology

## 2019-08-14 ENCOUNTER — Ambulatory Visit (INDEPENDENT_AMBULATORY_CARE_PROVIDER_SITE_OTHER): Payer: Medicare Other | Admitting: Obstetrics and Gynecology

## 2019-08-14 ENCOUNTER — Other Ambulatory Visit: Payer: Self-pay

## 2019-08-14 VITALS — BP 122/78 | HR 72 | Temp 97.1°F | Wt 143.2 lb

## 2019-08-14 DIAGNOSIS — Z4689 Encounter for fitting and adjustment of other specified devices: Secondary | ICD-10-CM | POA: Diagnosis not present

## 2019-08-14 DIAGNOSIS — N898 Other specified noninflammatory disorders of vagina: Secondary | ICD-10-CM | POA: Diagnosis not present

## 2019-08-14 DIAGNOSIS — N814 Uterovaginal prolapse, unspecified: Secondary | ICD-10-CM | POA: Diagnosis not present

## 2019-08-14 DIAGNOSIS — N8111 Cystocele, midline: Secondary | ICD-10-CM

## 2019-08-14 DIAGNOSIS — K5900 Constipation, unspecified: Secondary | ICD-10-CM | POA: Diagnosis not present

## 2019-08-22 ENCOUNTER — Other Ambulatory Visit: Payer: Self-pay | Admitting: Adult Health

## 2019-09-02 NOTE — Progress Notes (Signed)
GYNECOLOGY  VISIT   HPI: 68 y.o.   Married White or Caucasian Not Hispanic or Latino  female   G1P1001 with No LMP recorded. Patient is postmenopausal.   here for 2 week recheck. The pessary holds her prolapse well, but she is having continued vaginal irritation. She has been using the estrogen cream 3 x a week for the last 2 weeks.   GYNECOLOGIC HISTORY: No LMP recorded. Patient is postmenopausal. Contraception: Postmenopausal Menopausal hormone therapy: Estradiol vaginal cream        OB History    Gravida  1   Para  1   Term  1   Preterm      AB      Living  1     SAB      TAB      Ectopic      Multiple      Live Births  1              Patient Active Problem List   Diagnosis Date Noted  . Acute otitis externa of left ear 07/15/2019  . Irritable bowel syndrome with constipation 07/15/2019  . Chronic midline low back pain without sciatica 04/27/2019  . Allergic rhinitis due to pollen 04/27/2019  . Essential hypertension, benign 04/27/2019  . GAD (generalized anxiety disorder) 09/01/2018  . Chronic migraine without aura without status migrainosus, not intractable 09/01/2018  . Encounter for long-term (current) use of medications 09/01/2018  . Asthma without status asthmaticus 01/30/2018  . Hyperlipidemia, unspecified 01/30/2018  . Hypertension 01/30/2018  . Obesity, unspecified 01/30/2018  . Hypothyroidism 01/30/2018  . Acute respiratory failure with hypoxia ([place]) 12/31/2017  . Fatty infiltration of liver 12/10/2015  . Abdominal pain, LLQ (left lower quadrant) 11/03/2015  . History of adenomatous polyp of colon 11/03/2015  . Hallux valgus, left 09/18/2014  . 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  . Shoulder arthritis 12/19/2013    Past Medical History:  Diagnosis Date  . Allergic rhinitis   . Anxiety   . Asthma   . Blood transfusion without reported diagnosis   . Depression   . Diverticulosis   . Dyspnea   . Esophagitis   . GERD  (gastroesophageal reflux disease)   . Headache   . Heart murmur   . Hyperlipemia   . Hypertension   . Hypothyroidism   . Obesity   . Osteopenia   . Palpitations   . Pneumothorax   . Sleep apnea     Past Surgical History:  Procedure Laterality Date  . BUNIONECTOMY    . CATARACT EXTRACTION W/PHACO Right 10/11/2016   Procedure: CATARACT EXTRACTION PHACO AND INTRAOCULAR LENS PLACEMENT (IOC);  Surgeon: Birder Robson, MD;  Location: [facility] ORS;  Service: Ophthalmology;  Laterality: Right;  Lot# [id number] H [place]: 00:37.7 AP%: 18.1 CDE: 6.80  . CATARACT EXTRACTION W/PHACO Left 11/08/2016   Procedure: CATARACT EXTRACTION PHACO AND INTRAOCULAR LENS PLACEMENT (IOC);  Surgeon: Birder Robson, MD;  Location: [facility] ORS;  Service: Ophthalmology;  Laterality: Left;  PACK LOT: [id number] H US:00:32 AP:44 CDE:6.46  . COLONOSCOPY    . COLONOSCOPY WITH PROPOFOL N/A 11/26/2015   Procedure: COLONOSCOPY WITH PROPOFOL;  Surgeon: Manya Silvas, MD;  Location: [facility] ENDOSCOPY;  Service: Endoscopy;  Laterality: N/A;  . EYE SURGERY    . TONSILLECTOMY      Current Outpatient Medications  Medication Sig Dispense Refill  . acetaminophen (TYLENOL) 500 MG tablet Take 1,000 mg by mouth 2 (two) times daily as needed for mild pain or moderate pain.    Marland Kitchen  albuterol (PROVENTIL) (2.5 MG/3ML) 0.083% nebulizer solution Take 3 mLs (2.5 mg total) by nebulization every 6 (six) hours as needed for wheezing or shortness of breath. 150 mL 3  . albuterol (VENTOLIN HFA) 108 (90 Base) MCG/ACT inhaler INHALE 2 PUFFS BY MOUTH EVERY 6 HOURS 18 g 5  . ALPRAZolam (XANAX) 0.25 MG tablet Take 1 tablet (0.25 mg total) by mouth 2 (two) times daily as needed for anxiety. 60 tablet 2  . amLODipine (NORVASC) 5 MG tablet TAKE 1 TABLET BY MOUTH EVERY DAY 90 tablet 1  . ATROVENT HFA 17 MCG/ACT inhaler INHALE 2 PUFFS 4 TIMES DAILY 12.9 Inhaler 5  . bisacodyl (DULCOLAX) 5 MG EC tablet Take 5-10 mg by mouth at bedtime as needed for moderate  constipation. Two at night    . cetirizine (ZYRTEC) 10 MG tablet Take 10 mg by mouth daily.    . cholecalciferol (VITAMIN D) 1000 units tablet Take 1,000 Units by mouth at bedtime.    . ciprofloxacin-dexamethasone (CIPRODEX) OTIC suspension Place 4 drops into the left ear 2 (two) times daily. 7.5 mL 0  . clindamycin (CLEOCIN T) 1 % external solution APPLY 1 APPLICATION TOPICALLY 2 (TWO) TIMES DAILY AS NEEDED. FOR SKIN BREAKDOWN 30 mL 2  . cyclobenzaprine (FLEXERIL) 5 MG tablet Take 1-2 tablets (5-10 mg total) by mouth 2 (two) times daily as needed for muscle spasms. 120 tablet 2  . docusate sodium (COLACE) 100 MG capsule Take 100-200 mg by mouth at bedtime as needed for mild constipation or moderate constipation.     Marland Kitchen estradiol (ESTRACE) 0.1 MG/GM vaginal cream INSERT 1 GRAM VAGINALLY TWICE WEEKLY AT BEDTIME 42.5 g 1  . fenofibrate (TRICOR) 145 MG tablet Take 1 tablet (145 mg total) by mouth daily. 30 tablet 5  . FLOVENT HFA 110 MCG/ACT inhaler INHALE 1 PUFF BY MOUTH TWICE A DAY 12 Inhaler 3  . FLUoxetine (PROZAC) 40 MG capsule TAKE 1 CAPSULE BY MOUTH EVERY DAY 90 capsule 1  . HYDROcodone-Acetaminophen 5-300 MG TABS Take 1 tablet by mouth 2 (two) times daily as needed. For severe pain 15 tablet 0  . ipratropium (ATROVENT) 0.02 % nebulizer solution Use 1 vial nebulized QID prn shortness of breath/wheezing. 75 mL 3  . isometheptene-acetaminophen-dichloralphenazone (MIDRIN) 65-100-325 MG capsule Take 1 capsule by mouth 4 (four) times daily as needed for migraine. Maximum 5 capsules in 12 hours for migraine headaches, 8 capsules in 24 hours for tension headaches.    . levothyroxine (SYNTHROID, LEVOTHROID) 100 MCG tablet TAKE 1 TABLET BY MOUTH EVERY DAY IN THE MORNING 90 tablet 3  . lisinopril (ZESTRIL) 10 MG tablet Take 1 tablet (10 mg total) by mouth daily. 90 tablet 2  . magnesium oxide (MAG-OX) 400 MG tablet Take 400 mg by mouth at bedtime.     . montelukast (SINGULAIR) 10 MG tablet TAKE 1 TABLET BY  MOUTH EVERY DAY 90 tablet 1  . omeprazole (PRILOSEC) 40 MG capsule TAKE ONE CAPSULE BY MOUTH DAILY 90 capsule 3  . polyethylene glycol powder (GLYCOLAX/MIRALAX) 17 GM/SCOOP powder Take 17 g by mouth every evening. 3350 g 3  . predniSONE (STERAPRED UNI-PAK 21 TAB) 10 MG (21) TBPK tablet 6 day taper - take by mouth as directed for 6 days 21 tablet 0  . triamcinolone cream (KENALOG) 0.1 % Apply 1 application topically 2 (two) times daily as needed. For skin breakdown    . vitamin B-12 (CYANOCOBALAMIN) 1000 MCG tablet Take 1,000 mcg by mouth daily.     No  current facility-administered medications for this visit.      ALLERGIES: Advair diskus [fluticasone-salmeterol], Codeine, Erythromycin, Morphine and related, Nsaids, and Sulfa antibiotics  Family History  Problem Relation Age of Onset  . Breast cancer Maternal Grandmother   . Stroke Maternal Grandmother   . Bladder Cancer Father   . Prostate cancer Father   . Heart attack Father   . Aortic aneurysm Father   . Congestive Heart Failure Father   . Colon cancer Paternal Grandmother   . Aneurysm Paternal Grandmother   . Stroke Paternal Grandmother   . Congestive Heart Failure Paternal Grandmother   . Dementia Mother   . Stroke Mother   . Dementia Maternal Grandfather   . Stroke Maternal Grandfather   . Dementia Paternal Grandfather   . Stroke Paternal Grandfather     Social History   Socioeconomic History  . Marital status: Married    Spouse name: Not on file  . Number of children: Not on file  . Years of education: Not on file  . Highest education level: Not on file  Occupational History  . Not on file  Social Needs  . Financial resource strain: Not on file  . Food insecurity    Worry: Not on file    Inability: Not on file  . Transportation needs    Medical: Not on file    Non-medical: Not on file  Tobacco Use  . Smoking status: Former Research scientist (life sciences)  . Smokeless tobacco: Never Used  Substance and Sexual Activity  . Alcohol  use: No  . Drug use: No  . Sexual activity: Not Currently    Partners: Male    Birth control/protection: Post-menopausal  Lifestyle  . Physical activity    Days per week: Not on file    Minutes per session: Not on file  . Stress: Not on file  Relationships  . Social Herbalist on phone: Not on file    Gets together: Not on file    Attends religious service: Not on file    Active member of club or organization: Not on file    Attends meetings of clubs or organizations: Not on file    Relationship status: Not on file  . Intimate partner violence    Fear of current or ex partner: Not on file    Emotionally abused: Not on file    Physically abused: Not on file    Forced sexual activity: Not on file  Other Topics Concern  . Not on file  Social History Narrative  . Not on file    Review of Systems  Constitutional: Negative.   HENT: Negative.   Eyes: Negative.   Respiratory: Negative.   Cardiovascular: Negative.   Gastrointestinal: Negative.   Genitourinary: Negative.   Musculoskeletal: Negative.   Skin: Negative.   Neurological: Negative.   Endo/Heme/Allergies: Negative.   Psychiatric/Behavioral: Negative.     PHYSICAL EXAMINATION:    BP 118/78 (BP Location: Right Arm, Patient Position: Sitting, Cuff Size: Normal)   Pulse 80   Temp (!) 97 F (36.1 C) (Skin)   Wt 146 lb (66.2 kg)   BMI 27.59 kg/m     General appearance: alert, cooperative and appears stated age  Pelvic: External genitalia:  no lesions              Urethra:  normal appearing urethra with no masses, tenderness or lesions              Bartholins and Skenes: normal  Vagina: the pessary was removed and cleaned. She continues to have a 3 cm area of erythema in the posterior vaginal apex with a small ulceration within the area of erythema             Cervix: no lesions Chaperone was present for exam.  ASSESSMENT Genital prolapse, controlled with the pessary but with recurrent  vaginal irritation    PLAN Pessary left out Use estrogen cream 2 x a week F/U in 2 weeks (she prefers to f/u in 4 weeks) We discussed the option of trying a different pessary, showed the cube and gellhorn pessary to the patient We also discussed the possibility of surgery, discussed TVH, uterosacral suspension.    An After Visit Summary was printed and given to the patient.  ~25 minutes face to face time of which over 50% was spent in counseling.

## 2019-09-04 ENCOUNTER — Ambulatory Visit (INDEPENDENT_AMBULATORY_CARE_PROVIDER_SITE_OTHER): Payer: Medicare Other | Admitting: Obstetrics and Gynecology

## 2019-09-04 ENCOUNTER — Other Ambulatory Visit: Payer: Self-pay

## 2019-09-04 ENCOUNTER — Encounter: Payer: Self-pay | Admitting: Obstetrics and Gynecology

## 2019-09-04 VITALS — BP 118/78 | HR 80 | Temp 97.0°F | Wt 146.0 lb

## 2019-09-04 DIAGNOSIS — N814 Uterovaginal prolapse, unspecified: Secondary | ICD-10-CM

## 2019-09-04 DIAGNOSIS — N8111 Cystocele, midline: Secondary | ICD-10-CM

## 2019-09-04 DIAGNOSIS — N898 Other specified noninflammatory disorders of vagina: Secondary | ICD-10-CM

## 2019-09-04 DIAGNOSIS — Z4689 Encounter for fitting and adjustment of other specified devices: Secondary | ICD-10-CM

## 2019-09-10 ENCOUNTER — Other Ambulatory Visit: Payer: Self-pay | Admitting: Adult Health

## 2019-09-11 ENCOUNTER — Other Ambulatory Visit: Payer: Self-pay | Admitting: Nurse Practitioner

## 2019-09-11 MED ORDER — MONTELUKAST SODIUM 10 MG PO TABS: 10.0000 mg | ORAL_TABLET | Freq: Every day | ORAL | 3 refills | Status: DC

## 2019-09-19 DIAGNOSIS — H43813 Vitreous degeneration, bilateral: Secondary | ICD-10-CM | POA: Diagnosis not present

## 2019-09-23 ENCOUNTER — Other Ambulatory Visit: Payer: Self-pay | Admitting: Nurse Practitioner

## 2019-09-23 DIAGNOSIS — G8929 Other chronic pain: Secondary | ICD-10-CM

## 2019-09-23 MED ORDER — CYCLOBENZAPRINE HCL 5 MG PO TABS: 5.0000 mg | ORAL_TABLET | Freq: Two times a day (BID) | ORAL | 2 refills | Status: DC | PRN

## 2019-10-04 ENCOUNTER — Telehealth: Payer: Self-pay

## 2019-10-04 NOTE — Telephone Encounter (Signed)
Confirmed appointment with patient. klh °

## 2019-10-07 ENCOUNTER — Other Ambulatory Visit: Payer: Self-pay

## 2019-10-08 ENCOUNTER — Encounter: Payer: Self-pay | Admitting: Internal Medicine

## 2019-10-08 ENCOUNTER — Ambulatory Visit (INDEPENDENT_AMBULATORY_CARE_PROVIDER_SITE_OTHER): Payer: Medicare Other | Admitting: Internal Medicine

## 2019-10-08 VITALS — BP 132/83 | HR 86 | Temp 97.9°F | Resp 16 | Ht 61.0 in | Wt 146.0 lb

## 2019-10-08 DIAGNOSIS — J4541 Moderate persistent asthma with (acute) exacerbation: Secondary | ICD-10-CM

## 2019-10-08 DIAGNOSIS — J4540 Moderate persistent asthma, uncomplicated: Secondary | ICD-10-CM

## 2019-10-08 DIAGNOSIS — G4733 Obstructive sleep apnea (adult) (pediatric): Secondary | ICD-10-CM | POA: Diagnosis not present

## 2019-10-08 DIAGNOSIS — R0602 Shortness of breath: Secondary | ICD-10-CM | POA: Diagnosis not present

## 2019-10-08 DIAGNOSIS — J301 Allergic rhinitis due to pollen: Secondary | ICD-10-CM

## 2019-10-08 MED ORDER — ALBUTEROL SULFATE HFA 108 (90 BASE) MCG/ACT IN AERS: INHALATION_SPRAY | RESPIRATORY_TRACT | 5 refills | Status: DC

## 2019-10-08 MED ORDER — ATROVENT HFA 17 MCG/ACT IN AERS: INHALATION_SPRAY | RESPIRATORY_TRACT | 5 refills | Status: DC

## 2019-10-08 NOTE — Progress Notes (Signed)
[facility] [place], [place] [address]  Pulmonary Sleep Medicine   Office Visit Note  Patient Name: Bonnie West DOB: 10-09-1951 MRN [id number]  Date of Service: 10/08/2019  Complaints/HPI: Pt is here for pulmonary follow up.  She report her asthma has been well controlled since her last flare up in august.  She has not needed any extra medications.  She is using her inhalers as directed.  She denies any recent issues.  No hospitalizations or complaints.    ROS  General: (-) fever, (-) chills, (-) night sweats, (-) weakness Skin: (-) rashes, (-) itching,. Eyes: (-) visual changes, (-) redness, (-) itching. Nose and Sinuses: (-) nasal stuffiness or itchiness, (-) postnasal drip, (-) nosebleeds, (-) sinus trouble. Mouth and Throat: (-) sore throat, (-) hoarseness. Neck: (-) swollen glands, (-) enlarged thyroid, (-) neck pain. Respiratory: - cough, (-) bloody sputum, - shortness of breath, - wheezing. Cardiovascular: - ankle swelling, (-) chest pain. Lymphatic: (-) lymph node enlargement. Neurologic: (-) numbness, (-) tingling. Psychiatric: (-) anxiety, (-) depression   Current Medication: Outpatient Encounter Medications as of 10/08/2019  Medication Sig  . acetaminophen (TYLENOL) 500 MG tablet Take 1,000 mg by mouth 2 (two) times daily as needed for mild pain or moderate pain.  Marland Kitchen albuterol (PROVENTIL) (2.5 MG/3ML) 0.083% nebulizer solution Take 3 mLs (2.5 mg total) by nebulization every 6 (six) hours as needed for wheezing or shortness of breath.  Marland Kitchen albuterol (VENTOLIN HFA) 108 (90 Base) MCG/ACT inhaler INHALE 2 PUFFS BY MOUTH EVERY 6 HOURS  . ALPRAZolam (XANAX) 0.25 MG tablet Take 1 tablet (0.25 mg total) by mouth 2 (two) times daily as needed for anxiety.  Marland Kitchen amLODipine (NORVASC) 5 MG tablet TAKE 1 TABLET BY MOUTH EVERY DAY  . ATROVENT HFA 17 MCG/ACT inhaler INHALE 2 PUFFS 4 TIMES DAILY  . bisacodyl (DULCOLAX) 5 MG EC tablet Take 5-10 mg by mouth at  bedtime as needed for moderate constipation. Two at night  . cetirizine (ZYRTEC) 10 MG tablet Take 10 mg by mouth daily.  . cholecalciferol (VITAMIN D) 1000 units tablet Take 1,000 Units by mouth at bedtime.  . ciprofloxacin-dexamethasone (CIPRODEX) OTIC suspension Place 4 drops into the left ear 2 (two) times daily.  . clindamycin (CLEOCIN T) 1 % external solution APPLY 1 APPLICATION TOPICALLY 2 (TWO) TIMES DAILY AS NEEDED. FOR SKIN BREAKDOWN  . cyclobenzaprine (FLEXERIL) 5 MG tablet Take 1-2 tablets (5-10 mg total) by mouth 2 (two) times daily as needed for muscle spasms.  Marland Kitchen docusate sodium (COLACE) 100 MG capsule Take 100-200 mg by mouth at bedtime as needed for mild constipation or moderate constipation.   Marland Kitchen estradiol (ESTRACE) 0.1 MG/GM vaginal cream INSERT 1 GRAM VAGINALLY TWICE WEEKLY AT BEDTIME  . fenofibrate (TRICOR) 145 MG tablet Take 1 tablet (145 mg total) by mouth daily.  Marland Kitchen FLOVENT HFA 110 MCG/ACT inhaler INHALE 1 PUFF BY MOUTH TWICE A DAY  . FLUoxetine (PROZAC) 40 MG capsule TAKE 1 CAPSULE BY MOUTH EVERY DAY  . HYDROcodone-Acetaminophen 5-300 MG TABS Take 1 tablet by mouth 2 (two) times daily as needed. For severe pain  . ipratropium (ATROVENT) 0.02 % nebulizer solution Use 1 vial nebulized QID prn shortness of breath/wheezing.  . isometheptene-acetaminophen-dichloralphenazone (MIDRIN) 65-100-325 MG capsule Take 1 capsule by mouth 4 (four) times daily as needed for migraine. Maximum 5 capsules in 12 hours for migraine headaches, 8 capsules in 24 hours for tension headaches.  . levothyroxine (SYNTHROID, LEVOTHROID) 100 MCG tablet TAKE 1  TABLET BY MOUTH EVERY DAY IN THE MORNING  . lisinopril (ZESTRIL) 10 MG tablet Take 1 tablet (10 mg total) by mouth daily.  . magnesium oxide (MAG-OX) 400 MG tablet Take 400 mg by mouth at bedtime.   . montelukast (SINGULAIR) 10 MG tablet Take 1 tablet (10 mg total) by mouth daily.  Marland Kitchen omeprazole (PRILOSEC) 40 MG capsule TAKE 1 CAPSULE BY MOUTH EVERY DAY   . polyethylene glycol powder (GLYCOLAX/MIRALAX) 17 GM/SCOOP powder Take 17 g by mouth every evening.  . predniSONE (STERAPRED UNI-PAK 21 TAB) 10 MG (21) TBPK tablet 6 day taper - take by mouth as directed for 6 days  . triamcinolone cream (KENALOG) 0.1 % Apply 1 application topically 2 (two) times daily as needed. For skin breakdown  . vitamin B-12 (CYANOCOBALAMIN) 1000 MCG tablet Take 1,000 mcg by mouth daily.   No facility-administered encounter medications on file as of 10/08/2019.     Surgical History: Past Surgical History:  Procedure Laterality Date  . BUNIONECTOMY    . CATARACT EXTRACTION W/PHACO Right 10/11/2016   Procedure: CATARACT EXTRACTION PHACO AND INTRAOCULAR LENS PLACEMENT (IOC);  Surgeon: Birder Robson, MD;  Location: [facility] ORS;  Service: Ophthalmology;  Laterality: Right;  Lot# [id number] H [place]: 00:37.7 AP%: 18.1 CDE: 6.80  . CATARACT EXTRACTION W/PHACO Left 11/08/2016   Procedure: CATARACT EXTRACTION PHACO AND INTRAOCULAR LENS PLACEMENT (IOC);  Surgeon: Birder Robson, MD;  Location: [facility] ORS;  Service: Ophthalmology;  Laterality: Left;  PACK LOT: [id number] H US:00:32 AP:44 CDE:6.46  . COLONOSCOPY    . COLONOSCOPY WITH PROPOFOL N/A 11/26/2015   Procedure: COLONOSCOPY WITH PROPOFOL;  Surgeon: Manya Silvas, MD;  Location: [facility] ENDOSCOPY;  Service: Endoscopy;  Laterality: N/A;  . EYE SURGERY    . TONSILLECTOMY      Medical History: Past Medical History:  Diagnosis Date  . Allergic rhinitis   . Anxiety   . Asthma   . Blood transfusion without reported diagnosis   . Depression   . Diverticulosis   . Dyspnea   . Esophagitis   . GERD (gastroesophageal reflux disease)   . Headache   . Heart murmur   . Hyperlipemia   . Hypertension   . Hypothyroidism   . Obesity   . Osteopenia   . Palpitations   . Pneumothorax   . Sleep apnea     Family History: Family History  Problem Relation Age of Onset  . Breast cancer Maternal Grandmother   . Stroke Maternal  Grandmother   . Bladder Cancer Father   . Prostate cancer Father   . Heart attack Father   . Aortic aneurysm Father   . Congestive Heart Failure Father   . Colon cancer Paternal Grandmother   . Aneurysm Paternal Grandmother   . Stroke Paternal Grandmother   . Congestive Heart Failure Paternal Grandmother   . Dementia Mother   . Stroke Mother   . Dementia Maternal Grandfather   . Stroke Maternal Grandfather   . Dementia Paternal Grandfather   . Stroke Paternal Grandfather     Social History: Social History   Socioeconomic History  . Marital status: Married    Spouse name: Not on file  . Number of children: Not on file  . Years of education: Not on file  . Highest education level: Not on file  Occupational History  . Not on file  Social Needs  . Financial resource strain: Not on file  . Food insecurity    Worry: Not on file    Inability: Not  on file  . Transportation needs    Medical: Not on file    Non-medical: Not on file  Tobacco Use  . Smoking status: Former Research scientist (life sciences)  . Smokeless tobacco: Never Used  Substance and Sexual Activity  . Alcohol use: No  . Drug use: No  . Sexual activity: Not Currently    Partners: Male    Birth control/protection: Post-menopausal  Lifestyle  . Physical activity    Days per week: Not on file    Minutes per session: Not on file  . Stress: Not on file  Relationships  . Social Herbalist on phone: Not on file    Gets together: Not on file    Attends religious service: Not on file    Active member of club or organization: Not on file    Attends meetings of clubs or organizations: Not on file    Relationship status: Not on file  . Intimate partner violence    Fear of current or ex partner: Not on file    Emotionally abused: Not on file    Physically abused: Not on file    Forced sexual activity: Not on file  Other Topics Concern  . Not on file  Social History Narrative  . Not on file    Vital Signs: Blood  pressure 132/83, pulse 86, temperature 97.9 F (36.6 C), resp. rate 16, height 5\' 1"  (1.549 m), weight 146 lb (66.2 kg), SpO2 98 %.  Examination: General Appearance: The patient is well-developed, well-nourished, and in no distress. Skin: Gross inspection of skin unremarkable. Head: normocephalic, no gross deformities. Eyes: no gross deformities noted. ENT: ears appear grossly normal no exudates. Neck: Supple. No thyromegaly. No LAD. Respiratory: clear bilaterally. Cardiovascular: Normal S1 and S2 without murmur or rub. Extremities: No cyanosis. pulses are equal. Neurologic: Alert and oriented. No involuntary movements.  LABS: No results found for this or any previous visit (from the past 2160 hour(s)).  Radiology: No results found.  No results found.  No results found.    Assessment and Plan: Patient Active Problem List   Diagnosis Date Noted  . Acute otitis externa of left ear 07/15/2019  . Irritable bowel syndrome with constipation 07/15/2019  . Chronic midline low back pain without sciatica 04/27/2019  . Allergic rhinitis due to pollen 04/27/2019  . Essential hypertension, benign 04/27/2019  . GAD (generalized anxiety disorder) 09/01/2018  . Chronic migraine without aura without status migrainosus, not intractable 09/01/2018  . Encounter for long-term (current) use of medications 09/01/2018  . Asthma without status asthmaticus 01/30/2018  . Hyperlipidemia, unspecified 01/30/2018  . Hypertension 01/30/2018  . Obesity, unspecified 01/30/2018  . Hypothyroidism 01/30/2018  . Acute respiratory failure with hypoxia ([place]) 12/31/2017  . Fatty infiltration of liver 12/10/2015  . Abdominal pain, LLQ (left lower quadrant) 11/03/2015  . History of adenomatous polyp of colon 11/03/2015  . Hallux valgus, left 09/18/2014  . 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  . Shoulder arthritis 12/19/2013    1. Moderate persistent asthma without status asthmaticus with acute  exacerbation Pt is in need of PFT update, however she refuses, until covid is under better control.   2. OSA (obstructive sleep apnea) Pt has not worn cpap in over a year.  We discussed long term effects of untreated osa, and she verbalized understanding. I once again encouraged cpap use.   3. SOB (shortness of breath) FEV1 is 1.9 which is 95% of Pre-predicted value.  - Spirometry with graph  4. Seasonal  allergic rhinitis due to pollen Controlled, continue present management.   General Counseling: I have discussed the findings of the evaluation and examination with Vaughan Basta.  I have also discussed any further diagnostic evaluation thatmay be needed or ordered today. [place] verbalizes understanding of the findings of todays visit. We also reviewed her medications today and discussed drug interactions and side effects including but not limited excessive drowsiness and altered mental states. We also discussed that there is always a risk not just to her but also people around her. she has been encouraged to call the office with any questions or concerns that should arise related to todays visit.    Time spent:  25 This patient was seen by Orson Gear AGNP-C in Collaboration with Dr. Devona Konig as a part of collaborative care agreement.   I have personally obtained a history, examined the patient, evaluated laboratory and imaging results, formulated the assessment and plan and placed orders.    Allyne Gee, MD [facility] Pulmonary and Critical Care Sleep medicine

## 2019-10-09 ENCOUNTER — Ambulatory Visit (INDEPENDENT_AMBULATORY_CARE_PROVIDER_SITE_OTHER): Payer: Medicare Other | Admitting: Obstetrics and Gynecology

## 2019-10-09 ENCOUNTER — Encounter: Payer: Self-pay | Admitting: Obstetrics and Gynecology

## 2019-10-09 ENCOUNTER — Other Ambulatory Visit: Payer: Self-pay

## 2019-10-09 VITALS — BP 140/86 | HR 84 | Temp 97.3°F | Wt 148.0 lb

## 2019-10-09 DIAGNOSIS — Z4689 Encounter for fitting and adjustment of other specified devices: Secondary | ICD-10-CM

## 2019-10-09 DIAGNOSIS — N8111 Cystocele, midline: Secondary | ICD-10-CM

## 2019-10-09 DIAGNOSIS — N814 Uterovaginal prolapse, unspecified: Secondary | ICD-10-CM

## 2019-10-09 DIAGNOSIS — Z23 Encounter for immunization: Secondary | ICD-10-CM

## 2019-10-09 NOTE — Progress Notes (Signed)
GYNECOLOGY  VISIT   HPI: 68 y.o.   Married White or Caucasian Not Hispanic or Latino  female   G1P1001 with No LMP recorded. Patient is postmenopausal.   here for recheck and pessary insertion.  She has uterine prolapse and a cystocele that are well controlled with her pessary, but she has issues with recurrent vaginal irritation. Her pessary was left out last month. The prolapse is out and she has lower back pain. No vaginal bleeding, using her vaginal estrogen 2 x a week. She isn't sexually active.   GYNECOLOGIC HISTORY: No LMP recorded. Patient is postmenopausal. Contraception: Postmenopausal Menopausal hormone therapy: Estradiol vaginal cream        OB History    Gravida  1   Para  1   Term  1   Preterm      AB      Living  1     SAB      TAB      Ectopic      Multiple      Live Births  1              Patient Active Problem List   Diagnosis Date Noted  . Acute otitis externa of left ear 07/15/2019  . Irritable bowel syndrome with constipation 07/15/2019  . Chronic midline low back pain without sciatica 04/27/2019  . Allergic rhinitis due to pollen 04/27/2019  . Essential hypertension, benign 04/27/2019  . GAD (generalized anxiety disorder) 09/01/2018  . Chronic migraine without aura without status migrainosus, not intractable 09/01/2018  . Encounter for long-term (current) use of medications 09/01/2018  . Asthma without status asthmaticus 01/30/2018  . Hyperlipidemia, unspecified 01/30/2018  . Hypertension 01/30/2018  . Obesity, unspecified 01/30/2018  . Hypothyroidism 01/30/2018  . Acute respiratory failure with hypoxia ([place]) 12/31/2017  . Fatty infiltration of liver 12/10/2015  . Abdominal pain, LLQ (left lower quadrant) 11/03/2015  . History of adenomatous polyp of colon 11/03/2015  . Hallux valgus, left 09/18/2014  . 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  . Shoulder arthritis 12/19/2013    Past Medical History:  Diagnosis Date  .  Allergic rhinitis   . Anxiety   . Asthma   . Blood transfusion without reported diagnosis   . Depression   . Diverticulosis   . Dyspnea   . Esophagitis   . GERD (gastroesophageal reflux disease)   . Headache   . Heart murmur   . Hyperlipemia   . Hypertension   . Hypothyroidism   . Obesity   . Osteopenia   . Palpitations   . Pneumothorax   . Sleep apnea     Past Surgical History:  Procedure Laterality Date  . BUNIONECTOMY    . CATARACT EXTRACTION W/PHACO Right 10/11/2016   Procedure: CATARACT EXTRACTION PHACO AND INTRAOCULAR LENS PLACEMENT (IOC);  Surgeon: Birder Robson, MD;  Location: [facility] ORS;  Service: Ophthalmology;  Laterality: Right;  Lot# [id number] H [place]: 00:37.7 AP%: 18.1 CDE: 6.80  . CATARACT EXTRACTION W/PHACO Left 11/08/2016   Procedure: CATARACT EXTRACTION PHACO AND INTRAOCULAR LENS PLACEMENT (IOC);  Surgeon: Birder Robson, MD;  Location: [facility] ORS;  Service: Ophthalmology;  Laterality: Left;  PACK LOT: [id number] H US:00:32 AP:44 CDE:6.46  . COLONOSCOPY    . COLONOSCOPY WITH PROPOFOL N/A 11/26/2015   Procedure: COLONOSCOPY WITH PROPOFOL;  Surgeon: Manya Silvas, MD;  Location: [facility] ENDOSCOPY;  Service: Endoscopy;  Laterality: N/A;  . EYE SURGERY    . TONSILLECTOMY      Current Outpatient Medications  Medication  Sig Dispense Refill  . acetaminophen (TYLENOL) 500 MG tablet Take 1,000 mg by mouth 2 (two) times daily as needed for mild pain or moderate pain.    Marland Kitchen albuterol (PROVENTIL) (2.5 MG/3ML) 0.083% nebulizer solution Take 3 mLs (2.5 mg total) by nebulization every 6 (six) hours as needed for wheezing or shortness of breath. 150 mL 3  . albuterol (VENTOLIN HFA) 108 (90 Base) MCG/ACT inhaler INHALE 2 PUFFS BY MOUTH EVERY 6 HOURS 18 g 5  . ALPRAZolam (XANAX) 0.25 MG tablet Take 1 tablet (0.25 mg total) by mouth 2 (two) times daily as needed for anxiety. 60 tablet 2  . amLODipine (NORVASC) 5 MG tablet TAKE 1 TABLET BY MOUTH EVERY DAY 90 tablet 1  . bisacodyl  (DULCOLAX) 5 MG EC tablet Take 5-10 mg by mouth at bedtime as needed for moderate constipation. Two at night    . cetirizine (ZYRTEC) 10 MG tablet Take 10 mg by mouth daily.    . cholecalciferol (VITAMIN D) 1000 units tablet Take 1,000 Units by mouth at bedtime.    . clindamycin (CLEOCIN T) 1 % external solution APPLY 1 APPLICATION TOPICALLY 2 (TWO) TIMES DAILY AS NEEDED. FOR SKIN BREAKDOWN 30 mL 2  . cyclobenzaprine (FLEXERIL) 5 MG tablet Take 1-2 tablets (5-10 mg total) by mouth 2 (two) times daily as needed for muscle spasms. 120 tablet 2  . docusate sodium (COLACE) 100 MG capsule Take 100-200 mg by mouth at bedtime as needed for mild constipation or moderate constipation.     Marland Kitchen estradiol (ESTRACE) 0.1 MG/GM vaginal cream INSERT 1 GRAM VAGINALLY TWICE WEEKLY AT BEDTIME 42.5 g 1  . fenofibrate (TRICOR) 145 MG tablet Take 1 tablet (145 mg total) by mouth daily. 30 tablet 5  . FLOVENT HFA 110 MCG/ACT inhaler INHALE 1 PUFF BY MOUTH TWICE A DAY 12 Inhaler 3  . FLUoxetine (PROZAC) 40 MG capsule TAKE 1 CAPSULE BY MOUTH EVERY DAY 90 capsule 1  . HYDROcodone-Acetaminophen 5-300 MG TABS Take 1 tablet by mouth 2 (two) times daily as needed. For severe pain 15 tablet 0  . ipratropium (ATROVENT HFA) 17 MCG/ACT inhaler INHALE 2 PUFFS 4 TIMES DAILY 12.9 Inhaler 5  . ipratropium (ATROVENT) 0.02 % nebulizer solution Use 1 vial nebulized QID prn shortness of breath/wheezing. 75 mL 3  . isometheptene-acetaminophen-dichloralphenazone (MIDRIN) 65-100-325 MG capsule Take 1 capsule by mouth 4 (four) times daily as needed for migraine. Maximum 5 capsules in 12 hours for migraine headaches, 8 capsules in 24 hours for tension headaches.    . levothyroxine (SYNTHROID, LEVOTHROID) 100 MCG tablet TAKE 1 TABLET BY MOUTH EVERY DAY IN THE MORNING 90 tablet 3  . lisinopril (ZESTRIL) 10 MG tablet Take 1 tablet (10 mg total) by mouth daily. 90 tablet 2  . magnesium oxide (MAG-OX) 400 MG tablet Take 400 mg by mouth at bedtime.      . montelukast (SINGULAIR) 10 MG tablet Take 1 tablet (10 mg total) by mouth daily. 90 tablet 3  . omeprazole (PRILOSEC) 40 MG capsule TAKE 1 CAPSULE BY MOUTH EVERY DAY 90 capsule 3  . polyethylene glycol powder (GLYCOLAX/MIRALAX) 17 GM/SCOOP powder Take 17 g by mouth every evening. 3350 g 3  . triamcinolone cream (KENALOG) 0.1 % Apply 1 application topically 2 (two) times daily as needed. For skin breakdown    . vitamin B-12 (CYANOCOBALAMIN) 1000 MCG tablet Take 1,000 mcg by mouth daily.     No current facility-administered medications for this visit.      ALLERGIES:  Advair diskus [fluticasone-salmeterol], Codeine, Erythromycin, Morphine and related, Nsaids, and Sulfa antibiotics  Family History  Problem Relation Age of Onset  . Breast cancer Maternal Grandmother   . Stroke Maternal Grandmother   . Bladder Cancer Father   . Prostate cancer Father   . Heart attack Father   . Aortic aneurysm Father   . Congestive Heart Failure Father   . Colon cancer Paternal Grandmother   . Aneurysm Paternal Grandmother   . Stroke Paternal Grandmother   . Congestive Heart Failure Paternal Grandmother   . Dementia Mother   . Stroke Mother   . Dementia Maternal Grandfather   . Stroke Maternal Grandfather   . Dementia Paternal Grandfather   . Stroke Paternal Grandfather     Social History   Socioeconomic History  . Marital status: Married    Spouse name: Not on file  . Number of children: Not on file  . Years of education: Not on file  . Highest education level: Not on file  Occupational History  . Not on file  Social Needs  . Financial resource strain: Not on file  . Food insecurity    Worry: Not on file    Inability: Not on file  . Transportation needs    Medical: Not on file    Non-medical: Not on file  Tobacco Use  . Smoking status: Former Research scientist (life sciences)  . Smokeless tobacco: Never Used  Substance and Sexual Activity  . Alcohol use: No  . Drug use: No  . Sexual activity: Not  Currently    Partners: Male    Birth control/protection: Post-menopausal  Lifestyle  . Physical activity    Days per week: Not on file    Minutes per session: Not on file  . Stress: Not on file  Relationships  . Social Herbalist on phone: Not on file    Gets together: Not on file    Attends religious service: Not on file    Active member of club or organization: Not on file    Attends meetings of clubs or organizations: Not on file    Relationship status: Not on file  . Intimate partner violence    Fear of current or ex partner: Not on file    Emotionally abused: Not on file    Physically abused: Not on file    Forced sexual activity: Not on file  Other Topics Concern  . Not on file  Social History Narrative  . Not on file    Review of Systems  Constitutional: Negative.   HENT: Negative.   Eyes: Negative.   Respiratory: Negative.   Cardiovascular: Negative.   Gastrointestinal: Negative.   Genitourinary: Negative.   Musculoskeletal: Negative.   Skin: Negative.   Neurological: Negative.   Endo/Heme/Allergies: Negative.   Psychiatric/Behavioral: Negative.     PHYSICAL EXAMINATION:    BP 140/86 (BP Location: Right Arm, Patient Position: Sitting, Cuff Size: Normal)   Pulse 84   Temp (!) 97.3 F (36.3 C) (Skin)   Wt 148 lb (67.1 kg)   BMI 27.96 kg/m     General appearance: alert, cooperative and appears stated age  Pelvic: External genitalia:  no lesions              Urethra:  normal appearing urethra with no masses, tenderness or lesions              Bartholins and Skenes: normal  Vagina: normal appearing vagina with normal color and discharge, no lesions.               Cervix: no lesions, comes to introitus with coughing. Not further accessed  #6 ring pessary replaced.    Chaperone was present for exam.  ASSESSMENT Genital prolapse Had vaginal irritation from the pessary, took a break. Vagina has healed.     PLAN #6 ring  pessary replaced  F/U in one month Continue with 1 gram of estrace cream 2 x a week.    An After Visit Summary was printed and given to the patient.

## 2019-11-01 ENCOUNTER — Telehealth: Payer: Self-pay

## 2019-11-01 NOTE — Telephone Encounter (Signed)
CONFIRMED AND SCREENED FOR 11-05-19. °

## 2019-11-05 ENCOUNTER — Other Ambulatory Visit: Payer: Self-pay

## 2019-11-05 ENCOUNTER — Ambulatory Visit: Payer: Self-pay | Admitting: Adult Health

## 2019-11-05 ENCOUNTER — Encounter: Payer: Self-pay | Admitting: Nurse Practitioner

## 2019-11-05 ENCOUNTER — Ambulatory Visit (INDEPENDENT_AMBULATORY_CARE_PROVIDER_SITE_OTHER): Payer: Medicare Other | Admitting: Nurse Practitioner

## 2019-11-05 VITALS — BP 132/86 | HR 84 | Resp 16 | Ht 61.0 in | Wt 149.0 lb

## 2019-11-05 DIAGNOSIS — Z0001 Encounter for general adult medical examination with abnormal findings: Secondary | ICD-10-CM

## 2019-11-05 DIAGNOSIS — K219 Gastro-esophageal reflux disease without esophagitis: Secondary | ICD-10-CM

## 2019-11-05 DIAGNOSIS — R300 Dysuria: Secondary | ICD-10-CM

## 2019-11-05 DIAGNOSIS — M545 Low back pain: Secondary | ICD-10-CM | POA: Diagnosis not present

## 2019-11-05 DIAGNOSIS — G8929 Other chronic pain: Secondary | ICD-10-CM

## 2019-11-05 DIAGNOSIS — J4541 Moderate persistent asthma with (acute) exacerbation: Secondary | ICD-10-CM | POA: Diagnosis not present

## 2019-11-05 DIAGNOSIS — F411 Generalized anxiety disorder: Secondary | ICD-10-CM

## 2019-11-05 MED ORDER — HYDROCODONE-ACETAMINOPHEN 5-300 MG PO TABS: 1.0000 | ORAL_TABLET | Freq: Two times a day (BID) | ORAL | 0 refills | Status: DC | PRN

## 2019-11-05 MED ORDER — OMEPRAZOLE 40 MG PO CPDR: DELAYED_RELEASE_CAPSULE | ORAL | 3 refills | Status: DC

## 2019-11-05 NOTE — Progress Notes (Signed)
[facility] [place], [place] [address]  Internal MEDICINE  Office Visit Note  Patient Name: Bonnie West  [id number]  [id number]  Date of Service: 11/10/2019   Pt is here for routine health maintenance examination    Chief Complaint  Patient presents with  . Medical Management of Chronic Issues  . Annual Exam    medicare wellness visit   . Hyperlipidemia  . Hypertension  . Hypothyroidism     The patient is here for health maintenance exam. She is doing well overall. Asthma has been well managed. Has been staying in and away from most people since start of COVID 19. Blood pressure is well managed. Her main complaint is chronic lower back pain. She does, occassionally, take hydrocodone/APAP 5/300mg  when needed for severe pain. She gets a prescription for #15 tablets at her in office visits. She does need to have a new prescription for this today. She is due to have routine, fasting labs done. She would like to hold off on screening mammogram for now.   Current Medication: Outpatient Encounter Medications as of 11/05/2019  Medication Sig  . acetaminophen (TYLENOL) 500 MG tablet Take 1,000 mg by mouth 2 (two) times daily as needed for mild pain or moderate pain.  Marland Kitchen albuterol (PROVENTIL) (2.5 MG/3ML) 0.083% nebulizer solution Take 3 mLs (2.5 mg total) by nebulization every 6 (six) hours as needed for wheezing or shortness of breath.  Marland Kitchen albuterol (VENTOLIN HFA) 108 (90 Base) MCG/ACT inhaler INHALE 2 PUFFS BY MOUTH EVERY 6 HOURS  . ALPRAZolam (XANAX) 0.25 MG tablet Take 1 tablet (0.25 mg total) by mouth 2 (two) times daily as needed for anxiety.  Marland Kitchen amLODipine (NORVASC) 5 MG tablet TAKE 1 TABLET BY MOUTH EVERY DAY  . bisacodyl (DULCOLAX) 5 MG EC tablet Take 5-10 mg by mouth at bedtime as needed for moderate constipation. Two at night  . cetirizine (ZYRTEC) 10 MG tablet Take 10 mg by mouth daily.  . cholecalciferol (VITAMIN D) 1000 units tablet Take 1,000  Units by mouth at bedtime.  . clindamycin (CLEOCIN T) 1 % external solution APPLY 1 APPLICATION TOPICALLY 2 (TWO) TIMES DAILY AS NEEDED. FOR SKIN BREAKDOWN  . cyclobenzaprine (FLEXERIL) 5 MG tablet Take 1-2 tablets (5-10 mg total) by mouth 2 (two) times daily as needed for muscle spasms.  Marland Kitchen docusate sodium (COLACE) 100 MG capsule Take 100-200 mg by mouth at bedtime as needed for mild constipation or moderate constipation.   Marland Kitchen estradiol (ESTRACE) 0.1 MG/GM vaginal cream INSERT 1 GRAM VAGINALLY TWICE WEEKLY AT BEDTIME  . fenofibrate (TRICOR) 145 MG tablet Take 1 tablet (145 mg total) by mouth daily.  Marland Kitchen FLOVENT HFA 110 MCG/ACT inhaler INHALE 1 PUFF BY MOUTH TWICE A DAY  . FLUoxetine (PROZAC) 40 MG capsule TAKE 1 CAPSULE BY MOUTH EVERY DAY  . HYDROcodone-Acetaminophen 5-300 MG TABS Take 1 tablet by mouth 2 (two) times daily as needed. For severe pain  . ipratropium (ATROVENT HFA) 17 MCG/ACT inhaler INHALE 2 PUFFS 4 TIMES DAILY  . ipratropium (ATROVENT) 0.02 % nebulizer solution Use 1 vial nebulized QID prn shortness of breath/wheezing.  . isometheptene-acetaminophen-dichloralphenazone (MIDRIN) 65-100-325 MG capsule Take 1 capsule by mouth 4 (four) times daily as needed for migraine. Maximum 5 capsules in 12 hours for migraine headaches, 8 capsules in 24 hours for tension headaches.  . levothyroxine (SYNTHROID, LEVOTHROID) 100 MCG tablet TAKE 1 TABLET BY MOUTH EVERY DAY IN THE MORNING  . lisinopril (ZESTRIL) 10 MG tablet Take 1  tablet (10 mg total) by mouth daily.  . magnesium oxide (MAG-OX) 400 MG tablet Take 400 mg by mouth at bedtime.   . montelukast (SINGULAIR) 10 MG tablet Take 1 tablet (10 mg total) by mouth daily.  Marland Kitchen omeprazole (PRILOSEC) 40 MG capsule TAKE 1 CAPSULE BY MOUTH EVERY DAY  . polyethylene glycol powder (GLYCOLAX/MIRALAX) 17 GM/SCOOP powder Take 17 g by mouth every evening.  . triamcinolone cream (KENALOG) 0.1 % Apply 1 application topically 2 (two) times daily as needed. For skin  breakdown  . vitamin B-12 (CYANOCOBALAMIN) 1000 MCG tablet Take 1,000 mcg by mouth daily.  . [DISCONTINUED] HYDROcodone-Acetaminophen 5-300 MG TABS Take 1 tablet by mouth 2 (two) times daily as needed. For severe pain  . [DISCONTINUED] omeprazole (PRILOSEC) 40 MG capsule TAKE 1 CAPSULE BY MOUTH EVERY DAY   No facility-administered encounter medications on file as of 11/05/2019.    Surgical History: Past Surgical History:  Procedure Laterality Date  . BUNIONECTOMY    . CATARACT EXTRACTION W/PHACO Right 10/11/2016   Procedure: CATARACT EXTRACTION PHACO AND INTRAOCULAR LENS PLACEMENT (IOC);  Surgeon: Birder Robson, MD;  Location: [facility] ORS;  Service: Ophthalmology;  Laterality: Right;  Lot# [id number] H [place]: 00:37.7 AP%: 18.1 CDE: 6.80  . CATARACT EXTRACTION W/PHACO Left 11/08/2016   Procedure: CATARACT EXTRACTION PHACO AND INTRAOCULAR LENS PLACEMENT (IOC);  Surgeon: Birder Robson, MD;  Location: [facility] ORS;  Service: Ophthalmology;  Laterality: Left;  PACK LOT: [id number] H US:00:32 AP:44 CDE:6.46  . COLONOSCOPY    . COLONOSCOPY WITH PROPOFOL N/A 11/26/2015   Procedure: COLONOSCOPY WITH PROPOFOL;  Surgeon: Manya Silvas, MD;  Location: [facility] ENDOSCOPY;  Service: Endoscopy;  Laterality: N/A;  . EYE SURGERY    . TONSILLECTOMY      Medical History: Past Medical History:  Diagnosis Date  . Allergic rhinitis   . Anxiety   . Asthma   . Blood transfusion without reported diagnosis   . Depression   . Diverticulosis   . Dyspnea   . Esophagitis   . GERD (gastroesophageal reflux disease)   . Headache   . Heart murmur   . Hyperlipemia   . Hypertension   . Hypothyroidism   . Obesity   . Osteopenia   . Palpitations   . Pneumothorax   . Sleep apnea     Family History: Family History  Problem Relation Age of Onset  . Breast cancer Maternal Grandmother   . Stroke Maternal Grandmother   . Bladder Cancer Father   . Prostate cancer Father   . Heart attack Father   . Aortic aneurysm  Father   . Congestive Heart Failure Father   . Colon cancer Paternal Grandmother   . Aneurysm Paternal Grandmother   . Stroke Paternal Grandmother   . Congestive Heart Failure Paternal Grandmother   . Dementia Mother   . Stroke Mother   . Dementia Maternal Grandfather   . Stroke Maternal Grandfather   . Dementia Paternal Grandfather   . Stroke Paternal Grandfather       Review of Systems  Constitutional: Positive for fatigue. Negative for activity change, chills and unexpected weight change.  HENT: Negative for congestion, postnasal drip, rhinorrhea, sneezing, sore throat and voice change.   Eyes: Negative.  Negative for redness.  Respiratory: Positive for cough and wheezing. Negative for chest tightness and shortness of breath.        Intermittent  Cardiovascular: Negative for chest pain and palpitations.  Gastrointestinal: Negative for abdominal pain, constipation, diarrhea, nausea and vomiting.  Endocrine: Negative  for cold intolerance, heat intolerance, polydipsia, polyphagia and polyuria.  Genitourinary: Negative.  Negative for dysuria and frequency.  Musculoskeletal: Positive for arthralgias and joint swelling. Negative for back pain and neck pain.       Intermittent lower back pain.   Skin: Negative for rash.  Allergic/Immunologic: Positive for environmental allergies.  Neurological: Negative for dizziness, tremors, numbness and headaches.  Hematological: Negative for adenopathy. Does not bruise/bleed easily.  Psychiatric/Behavioral: Positive for dysphoric mood. Negative for behavioral problems (Depression), sleep disturbance and suicidal ideas. The patient is nervous/anxious.      Today's Vitals   11/05/19 1413  BP: 132/86  Pulse: 84  Resp: 16  SpO2: 94%  Weight: 149 lb (67.6 kg)  Height: 5\' 1"  (1.549 m)   Body mass index is 28.15 kg/m.    Physical Exam Vitals and nursing note reviewed.  Constitutional:      General: She is not in acute distress.     Appearance: Normal appearance. She is well-developed. She is not diaphoretic.  HENT:     Head: Normocephalic and atraumatic.     Mouth/Throat:     Pharynx: No oropharyngeal exudate.  Eyes:     Pupils: Pupils are equal, round, and reactive to light.  Neck:     Thyroid: No thyromegaly.     Vascular: No JVD.     Trachea: No tracheal deviation.  Cardiovascular:     Rate and Rhythm: Normal rate and regular rhythm.     Heart sounds: Normal heart sounds. No murmur. No friction rub. No gallop.   Pulmonary:     Effort: Pulmonary effort is normal. No respiratory distress.     Breath sounds: Normal breath sounds. No wheezing or rales.  Chest:     Chest wall: No tenderness.  Abdominal:     General: Bowel sounds are normal.     Palpations: Abdomen is soft.     Tenderness: There is no abdominal tenderness.  Musculoskeletal:        General: Normal range of motion.     Cervical back: Normal range of motion and neck supple.  Lymphadenopathy:     Cervical: No cervical adenopathy.  Skin:    General: Skin is warm and dry.  Neurological:     Mental Status: She is alert and oriented to person, place, and time.     Cranial Nerves: No cranial nerve deficit.  Psychiatric:        Attention and Perception: Attention and perception normal.        Mood and Affect: Affect normal. Mood is anxious. Mood is not depressed.        Speech: Speech normal.        Behavior: Behavior normal. Behavior is cooperative.        Thought Content: Thought content normal.        Cognition and Memory: Cognition and memory normal.        Judgment: Judgment normal.    Depression screen [facility] 2/9 11/05/2019 06/06/2019 04/11/2019 01/15/2019 10/30/2018  Decreased Interest 0 0 0 0 0  Down, Depressed, Hopeless 0 0 0 0 0  PHQ - 2 Score 0 0 0 0 0  Altered sleeping - - - - -  Tired, decreased energy - - - - -  Change in appetite - - - - -  Feeling bad or failure about yourself  - - - - -  Trouble concentrating - - - - -  Moving  slowly or fidgety/restless - - - - -  Suicidal thoughts - - - - -  PHQ-9 Score - - - - -    Functional Status Survey: Is the patient deaf or have difficulty hearing?: No Does the patient have difficulty seeing, even when wearing glasses/contacts?: No Does the patient have difficulty concentrating, remembering, or making decisions?: No Does the patient have difficulty walking or climbing stairs?: No Does the patient have difficulty dressing or bathing?: No Does the patient have difficulty doing errands alone such as visiting a doctor's office or shopping?: No  MMSE - [place] Exam 11/05/2019 10/30/2018  Orientation to time 5 5  Orientation to Place 5 5  Registration 3 3  Attention/ Calculation 5 5  Recall 3 3  Language- name 2 objects 2 2  Language- repeat 1 1  Language- follow 3 step command 3 3  Language- read & follow direction 1 1  Write a sentence 1 1  Copy design 1 1  Total score 30 30    Fall Risk  11/05/2019 06/06/2019 04/11/2019 01/15/2019 10/30/2018  Falls in the past year? 0 0 0 0 0  Number falls in past yr: 0 - 0 - -  Injury with Fall? 0 - 0 - -  Comment - - - - -    Assessment/Plan: 1. Encounter for general adult medical examination with abnormal findings Annual health maintenance exam today.  2. Chronic midline low back pain without sciatica May continue to take hydrocodone/APAP 5-300mg  tablets when needed for severe pain. A new prescription for #15 tablets sent to her pharmacy today.  - HYDROcodone-Acetaminophen 5-300 MG TABS; Take 1 tablet by mouth 2 (two) times daily as needed. For severe pain  Dispense: 15 tablet; Refill: 0  3. Gastroesophageal reflux disease without esophagitis - omeprazole (PRILOSEC) 40 MG capsule; TAKE 1 CAPSULE BY MOUTH EVERY DAY  Dispense: 90 capsule; Refill: 3  4. Moderate persistent asthma without status asthmaticus with acute exacerbation Stable. Continue inhalers and respiratory medication as prescribed   5. GAD  (generalized anxiety disorder) Continue fluoxetine every day. May take alprazolam as needed and as prescribed   6. Dysuria - UA/M w/rflx Culture, Routine  General Counseling: Sandy Salaam understanding of the findings of todays visit and agrees with plan of treatment. I have discussed any further diagnostic evaluation that may be needed or ordered today. We also reviewed her medications today. she has been encouraged to call the office with any questions or concerns that should arise related to todays visit.    Counseling:  This patient was seen by Leretha Pol FNP Collaboration with Dr Lavera Guise as a part of collaborative care agreement  Orders Placed This Encounter  Procedures  . Microscopic Examination  . Urine Culture, Reflex  . UA/M w/rflx Culture, Routine    Meds ordered this encounter  Medications  . HYDROcodone-Acetaminophen 5-300 MG TABS    Sig: Take 1 tablet by mouth 2 (two) times daily as needed. For severe pain    Dispense:  15 tablet    Refill:  0    Order Specific Question:   Supervising Provider    Answer:   Lavera Guise [id number]  . omeprazole (PRILOSEC) 40 MG capsule    Sig: TAKE 1 CAPSULE BY MOUTH EVERY DAY    Dispense:  90 capsule    Refill:  3    Order Specific Question:   Supervising Provider    Answer:   Lavera Guise [id number]    Time spent: 30 Minutes  Lavera Guise, MD  Internal Medicine

## 2019-11-06 NOTE — Progress Notes (Signed)
Waiting for results of culture and sensitivity. Will treat as indicated .

## 2019-11-08 LAB — UA/M W/RFLX CULTURE, ROUTINE
Bilirubin, UA: NEGATIVE
Glucose, UA: NEGATIVE
Ketones, UA: NEGATIVE
Nitrite, UA: NEGATIVE
Protein,UA: NEGATIVE
RBC, UA: NEGATIVE
Specific Gravity, UA: 1.017 (ref 1.005–1.030)
Urobilinogen, Ur: 0.2 mg/dL (ref 0.2–1.0)
pH, UA: 7.5 (ref 5.0–7.5)

## 2019-11-08 LAB — MICROSCOPIC EXAMINATION
Casts: NONE SEEN /lpf
WBC, UA: >30 /hpf — AB (ref 0–5)

## 2019-11-08 LAB — URINE CULTURE, REFLEX

## 2019-11-10 DIAGNOSIS — K219 Gastro-esophageal reflux disease without esophagitis: Secondary | ICD-10-CM | POA: Insufficient documentation

## 2019-11-10 DIAGNOSIS — Z0001 Encounter for general adult medical examination with abnormal findings: Secondary | ICD-10-CM | POA: Insufficient documentation

## 2019-11-10 DIAGNOSIS — R300 Dysuria: Secondary | ICD-10-CM | POA: Insufficient documentation

## 2019-11-11 ENCOUNTER — Other Ambulatory Visit: Payer: Self-pay | Admitting: Internal Medicine

## 2019-11-11 ENCOUNTER — Telehealth: Payer: Self-pay

## 2019-11-11 MED ORDER — CIPROFLOXACIN HCL 500 MG PO TABS: 500.0000 mg | ORAL_TABLET | Freq: Two times a day (BID) | ORAL | 0 refills | Status: DC

## 2019-11-11 NOTE — Progress Notes (Signed)
cipro

## 2019-11-11 NOTE — Progress Notes (Signed)
Please notify pt that I have sent cipro for uti per C/S

## 2019-11-11 NOTE — Telephone Encounter (Signed)
-----   Message from Lavera Guise, MD sent at 11/11/2019  8:00 AM EST ----- Please notify pt that I have sent cipro for uti per C/S

## 2019-11-11 NOTE — Telephone Encounter (Signed)
Pt advised we send cipro physical urine showed infection

## 2019-11-12 ENCOUNTER — Other Ambulatory Visit: Payer: Self-pay

## 2019-11-12 DIAGNOSIS — L719 Rosacea, unspecified: Secondary | ICD-10-CM

## 2019-11-12 MED ORDER — CLINDAMYCIN PHOSPHATE 1 % EX SOLN: 1.0000 "application " | Freq: Two times a day (BID) | CUTANEOUS | 2 refills | Status: DC | PRN

## 2019-11-12 NOTE — Progress Notes (Signed)
Thank you :)

## 2019-11-18 ENCOUNTER — Other Ambulatory Visit: Payer: Self-pay

## 2019-11-18 NOTE — Progress Notes (Signed)
GYNECOLOGY  VISIT   HPI: 68 y.o.   Married White or Caucasian Not Hispanic or Latino  female   [id number] with No LMP recorded. Patient is postmenopausal.   here for 4 week pessary check. She states that she is taking cipro for a UTI that her PCP is treating her for. She states that she is having some vaginal discharge.  The patient has a grade 2 uterine prolapse and grade one cystocele. She developed vaginal irritation with her pessary and it was left out in 10/20. Last month the pessary was replaced. She is using estrace cream 2 x a week.  No prolapse, some lower back pain, no discomfort with the pessary. She has some vaginal d/c, yellow. No itching, burning or irritation.  Just occasional GSI, some urge incontinence. The urge incontinence occurs several times a week.   GYNECOLOGIC HISTORY: No LMP recorded. Patient is postmenopausal. Contraception:PMP Menopausal hormone therapy: estrace        OB History    Gravida  1   Para  1   Term  1   Preterm      AB      Living  1     SAB      TAB      Ectopic      Multiple      Live Births  1              Patient Active Problem List   Diagnosis Date Noted  . Encounter for general adult medical examination with abnormal findings 11/10/2019  . Gastroesophageal reflux disease without esophagitis 11/10/2019  . Dysuria 11/10/2019  . Acute otitis externa of left ear 07/15/2019  . Irritable bowel syndrome with constipation 07/15/2019  . Chronic midline low back pain without sciatica 04/27/2019  . Allergic rhinitis due to pollen 04/27/2019  . Essential hypertension, benign 04/27/2019  . GAD (generalized anxiety disorder) 09/01/2018  . Chronic migraine without aura without status migrainosus, not intractable 09/01/2018  . Encounter for long-term (current) use of medications 09/01/2018  . Asthma without status asthmaticus 01/30/2018  . Hyperlipidemia, unspecified 01/30/2018  . Hypertension 01/30/2018  . Obesity, unspecified  01/30/2018  . Hypothyroidism 01/30/2018  . Acute respiratory failure with hypoxia ([place]) 12/31/2017  . Fatty infiltration of liver 12/10/2015  . Abdominal pain, LLQ (left lower quadrant) 11/03/2015  . History of adenomatous polyp of colon 11/03/2015  . Hallux valgus, left 09/18/2014  . 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  . Shoulder arthritis 12/19/2013    Past Medical History:  Diagnosis Date  . Allergic rhinitis   . Anxiety   . Asthma   . Blood transfusion without reported diagnosis   . Depression   . Diverticulosis   . Dyspnea   . Esophagitis   . GERD (gastroesophageal reflux disease)   . Headache   . Heart murmur   . Hyperlipemia   . Hypertension   . Hypothyroidism   . Obesity   . Osteopenia   . Palpitations   . Pneumothorax   . Sleep apnea     Past Surgical History:  Procedure Laterality Date  . BUNIONECTOMY    . CATARACT EXTRACTION W/PHACO Right 10/11/2016   Procedure: CATARACT EXTRACTION PHACO AND INTRAOCULAR LENS PLACEMENT (IOC);  Surgeon: Birder Robson, MD;  Location: [facility] ORS;  Service: Ophthalmology;  Laterality: Right;  Lot# [id number] H [place]: 00:37.7 AP%: 18.1 CDE: 6.80  . CATARACT EXTRACTION W/PHACO Left 11/08/2016   Procedure: CATARACT EXTRACTION PHACO AND INTRAOCULAR LENS PLACEMENT (IOC);  Surgeon: Gwyndolyn Saxon  Porfilio, MD;  Location: [facility] ORS;  Service: Ophthalmology;  Laterality: Left;  PACK LOT: [id number] H US:00:32 AP:44 CDE:6.46  . COLONOSCOPY    . COLONOSCOPY WITH PROPOFOL N/A 11/26/2015   Procedure: COLONOSCOPY WITH PROPOFOL;  Surgeon: Manya Silvas, MD;  Location: [facility] END[facility]COPY;  Service: Endoscopy;  Laterality: N/A;  . EYE SURGERY    . TONSILLECTOMY      Current Outpatient Medications  Medication Sig Dispense Refill  . acetaminophen (TYLENOL) 500 MG tablet Take 1,000 mg by mouth 2 (two) times daily as needed for mild pain or moderate pain.    Marland Kitchen albuterol (PROVENTIL) (2.5 MG/3ML) 0.083% nebulizer solution Take 3 mLs (2.5 mg total) by  nebulization every 6 (six) hours as needed for wheezing or shortness of breath. 150 mL 3  . albuterol (VENTOLIN HFA) 108 (90 Base) MCG/ACT inhaler INHALE 2 PUFFS BY MOUTH EVERY 6 HOURS 18 g 5  . ALPRAZolam (XANAX) 0.25 MG tablet Take 1 tablet (0.25 mg total) by mouth 2 (two) times daily as needed for anxiety. 60 tablet 2  . amLODipine (NORVASC) 5 MG tablet TAKE 1 TABLET BY MOUTH EVERY DAY 90 tablet 1  . bisacodyl (DULCOLAX) 5 MG EC tablet Take 5-10 mg by mouth at bedtime as needed for moderate constipation. Two at night    . cetirizine (ZYRTEC) 10 MG tablet Take 10 mg by mouth daily.    . cholecalciferol (VITAMIN D) 1000 units tablet Take 1,000 Units by mouth at bedtime.    . ciprofloxacin (CIPRO) 500 MG tablet Take 1 tablet (500 mg total) by mouth 2 (two) times daily. For UTI 20 tablet 0  . clindamycin (CLEOCIN T) 1 % external solution Apply 1 application topically 2 (two) times daily as needed. For skin breakdown 30 mL 2  . cyclobenzaprine (FLEXERIL) 5 MG tablet Take 1-2 tablets (5-10 mg total) by mouth 2 (two) times daily as needed for muscle spasms. 120 tablet 2  . docusate sodium (COLACE) 100 MG capsule Take 100-200 mg by mouth at bedtime as needed for mild constipation or moderate constipation.     Marland Kitchen estradiol (ESTRACE) 0.1 MG/GM vaginal cream INSERT 1 GRAM VAGINALLY TWICE WEEKLY AT BEDTIME 42.5 g 1  . fenofibrate (TRICOR) 145 MG tablet Take 1 tablet (145 mg total) by mouth daily. 30 tablet 5  . FLOVENT HFA 110 MCG/ACT inhaler INHALE 1 PUFF BY MOUTH TWICE A DAY 12 Inhaler 3  . FLUoxetine (PROZAC) 40 MG capsule TAKE 1 CAPSULE BY MOUTH EVERY DAY 90 capsule 1  . HYDROcodone-Acetaminophen 5-300 MG TABS Take 1 tablet by mouth 2 (two) times daily as needed. For severe pain 15 tablet 0  . ipratropium (ATROVENT HFA) 17 MCG/ACT inhaler INHALE 2 PUFFS 4 TIMES DAILY 12.9 Inhaler 5  . ipratropium (ATROVENT) 0.02 % nebulizer solution Use 1 vial nebulized QID prn shortness of breath/wheezing. 75 mL 3  .  isometheptene-acetaminophen-dichloralphenazone (MIDRIN) 65-100-325 MG capsule Take 1 capsule by mouth 4 (four) times daily as needed for migraine. Maximum 5 capsules in 12 hours for migraine headaches, 8 capsules in 24 hours for tension headaches.    . levothyroxine (SYNTHROID, LEVOTHROID) 100 MCG tablet TAKE 1 TABLET BY MOUTH EVERY DAY IN THE MORNING 90 tablet 3  . lisinopril (ZESTRIL) 10 MG tablet Take 1 tablet (10 mg total) by mouth daily. 90 tablet 2  . magnesium oxide (MAG-OX) 400 MG tablet Take 400 mg by mouth at bedtime.     . montelukast (SINGULAIR) 10 MG tablet Take 1 tablet (10 mg  total) by mouth daily. 90 tablet 3  . omeprazole (PRILOSEC) 40 MG capsule TAKE 1 CAPSULE BY MOUTH EVERY DAY 90 capsule 3  . polyethylene glycol powder (GLYCOLAX/MIRALAX) 17 GM/SCOOP powder Take 17 g by mouth every evening. 3350 g 3  . triamcinolone cream (KENALOG) 0.1 % Apply 1 application topically 2 (two) times daily as needed. For skin breakdown    . vitamin B-12 (CYANOCOBALAMIN) 1000 MCG tablet Take 1,000 mcg by mouth daily.     No current facility-administered medications for this visit.     ALLERGIES: Advair diskus [fluticasone-salmeterol], Codeine, Erythromycin, Morphine and related, Nsaids, and Sulfa antibiotics  Family History  Problem Relation Age of Onset  . Breast cancer Maternal Grandmother   . Stroke Maternal Grandmother   . Bladder Cancer Father   . Prostate cancer Father   . Heart attack Father   . Aortic aneurysm Father   . Congestive Heart Failure Father   . Colon cancer Paternal Grandmother   . Aneurysm Paternal Grandmother   . Stroke Paternal Grandmother   . Congestive Heart Failure Paternal Grandmother   . Dementia Mother   . Stroke Mother   . Dementia Maternal Grandfather   . Stroke Maternal Grandfather   . Dementia Paternal Grandfather   . Stroke Paternal Grandfather     Social History   Socioeconomic History  . Marital status: Married    Spouse name: Not on file  .  Number of children: Not on file  . Years of education: Not on file  . Highest education level: Not on file  Occupational History  . Not on file  Tobacco Use  . Smoking status: Former Research scientist (life sciences)  . Smokeless tobacco: Never Used  Substance and Sexual Activity  . Alcohol use: No  . Drug use: No  . Sexual activity: Not Currently    Partners: Male    Birth control/protection: Post-menopausal  Other Topics Concern  . Not on file  Social History Narrative  . Not on file   Social Determinants of Health   Financial Resource Strain:   . Difficulty of Paying Living Expenses: Not on file  Food Insecurity:   . Worried About Charity fundraiser in the Last Year: Not on file  . Ran Out of Food in the Last Year: Not on file  Transportation Needs:   . Lack of Transportation (Medical): Not on file  . Lack of Transportation (Non-Medical): Not on file  Physical Activity:   . Days of Exercise per Week: Not on file  . Minutes of Exercise per Session: Not on file  Stress:   . Feeling of Stress : Not on file  Social Connections:   . Frequency of Communication with Friends and Family: Not on file  . Frequency of Social Gatherings with Friends and Family: Not on file  . Attends Religious Services: Not on file  . Active Member of Clubs or Organizations: Not on file  . Attends Archivist Meetings: Not on file  . Marital Status: Not on file  Intimate Partner Violence:   . Fear of Current or Ex-Partner: Not on file  . Emotionally Abused: Not on file  . Physically Abused: Not on file  . Sexually Abused: Not on file    Review of Systems  Constitutional: Negative.   HENT: Negative.   Eyes: Negative.   Respiratory: Negative.   Cardiovascular: Negative.   Gastrointestinal: Negative.   Genitourinary: Negative.        Vaginal discharge.   Musculoskeletal: Negative.  Skin: Negative.   Neurological: Negative.   Endo/Heme/Allergies: Negative.   Psychiatric/Behavioral: Negative.      PHYSICAL EXAMINATION:    There were no vitals taken for this visit.    General appearance: alert, cooperative and appears stated age  Pelvic: External genitalia:  no lesions              Urethra:  normal appearing urethra with no masses, tenderness or lesions              Bartholins and Skenes: normal                 Vagina: the pessary was removed and cleaned. She has irritation in the posterior vaginal apex ~3 x 1.5 cm, erythematous, minimal erosion, no granulation tissue, not bleeding. She was fitted with the #6 cooper pessary (correlates with the #7 in the other pessary). Felt comfortable. The pessary was removed, estrace cream placed              Cervix: no lesions              Chaperone was present for exam.  ASSESSMENT Uterine prolapse Cystocele Pessary controls her prolapse, but she is getting recurrent vaginal irritation Refitted for a #7 ring pessary with support    PLAN Pessary break x 2 weeks Use estrace cream 3 x a week for the next 2 weeks, then go back to 2 x a week.  Discussed the option of TVH/uterosacral ligament suspension/cystocele repair/BSO. Information given, discussed recovery and risks.     An After Visit Summary was printed and given to the patient.  Over 30 minutes face to face time of which over 50% was spent in counseling.

## 2019-11-20 ENCOUNTER — Encounter: Payer: Self-pay | Admitting: Obstetrics and Gynecology

## 2019-11-20 ENCOUNTER — Other Ambulatory Visit: Payer: Self-pay

## 2019-11-20 ENCOUNTER — Ambulatory Visit (INDEPENDENT_AMBULATORY_CARE_PROVIDER_SITE_OTHER): Payer: Medicare Other | Admitting: Obstetrics and Gynecology

## 2019-11-20 VITALS — BP 130/64 | HR 71 | Temp 98.2°F | Ht 61.0 in | Wt 151.8 lb

## 2019-11-20 DIAGNOSIS — N814 Uterovaginal prolapse, unspecified: Secondary | ICD-10-CM | POA: Diagnosis not present

## 2019-11-20 DIAGNOSIS — N899 Noninflammatory disorder of vagina, unspecified: Secondary | ICD-10-CM

## 2019-11-20 DIAGNOSIS — N8111 Cystocele, midline: Secondary | ICD-10-CM

## 2019-11-20 DIAGNOSIS — Z4689 Encounter for fitting and adjustment of other specified devices: Secondary | ICD-10-CM | POA: Diagnosis not present

## 2019-11-20 DIAGNOSIS — N898 Other specified noninflammatory disorders of vagina: Secondary | ICD-10-CM

## 2019-11-20 NOTE — Patient Instructions (Signed)
Vaginal Hysterectomy  A vaginal hysterectomy is a procedure to remove all or part of the uterus through a small incision in the vagina. In this procedure, your health care provider may remove your entire uterus, including the lower end (cervix). You may need a vaginal hysterectomy to treat:  Uterine fibroids.  A condition that causes the lining of the uterus to grow in other areas (endometriosis).  Problems with pelvic support.  Cancer of the cervix, ovaries, uterus, or tissue that lines the uterus (endometrium).  Excessive (dysfunctional) uterine bleeding. When removing your uterus, your health care provider may also remove the organs that produce eggs (ovaries) and the tubes that carry eggs to your uterus (fallopian tubes). After a vaginal hysterectomy, you will no longer be able to have a baby. You will also no longer get your menstrual period. Tell a health care provider about:  Any allergies you have.  All medicines you are taking, including vitamins, herbs, eye drops, creams, and over-the-counter medicines.  Any problems you or family members have had with anesthetic medicines.  Any blood disorders you have.  Any surgeries you have had.  Any medical conditions you have.  Whether you are pregnant or may be pregnant. What are the risks? Generally, this is a safe procedure. However, problems may occur, including:  Bleeding.  Infection.  A blood clot that forms in your leg and travels to your lungs (pulmonary embolism).  Damage to surrounding organs.  Pain during sex. What happens before the procedure?  Ask your health care provider what organs will be removed during surgery.  Ask your health care provider about: ? Changing or stopping your regular medicines. This is especially important if you are taking diabetes medicines or blood thinners. ? Taking medicines such as aspirin and ibuprofen. These medicines can thin your blood. Do not take these medicines before your  procedure if your health care provider instructs you not to.  Follow instructions from your health care provider about eating or drinking restrictions.  Do not use any tobacco products, such as cigarettes, chewing tobacco, and e-cigarettes. If you need help quitting, ask your health care provider.  Plan to have someone take you home after discharge from the hospital. What happens during the procedure?  To reduce your risk of infection: ? Your health care team will wash or sanitize their hands. ? Your skin will be washed with soap.  An IV tube will be inserted into one of your veins.  You may be given antibiotic medicine to help prevent infection.  You will be given one or more of the following: ? A medicine to help you relax (sedative). ? A medicine to numb the area (local anesthetic). ? A medicine to make you fall asleep (general anesthetic). ? A medicine that is injected into an area of your body to numb everything beyond the injection site (regional anesthetic).  Your surgeon will make an incision in your vagina.  Your surgeon will locate and remove all or part of your uterus.  Your ovaries and fallopian tubes may be removed at the same time.  The incision will be closed with stitches (sutures) that dissolve over time. The procedure may vary among health care providers and hospitals. What happens after the procedure?  Your blood pressure, heart rate, breathing rate, and blood oxygen level will be monitored often until the medicines you were given have worn off.  You will be encouraged to get up and walk around after a few hours to help prevent complications.  You may have IV tubes in place for a few days.  You will be given pain medicine as needed.  Do not drive for 24 hours if you were given a sedative. This information is not intended to replace advice given to you by your health care provider. Make sure you discuss any questions you have with your health care  provider. Document Released: 02/29/2016 Document Revised: 06/30/2016 Document Reviewed: 11/22/2015 Elsevier Patient Education  2020 [employer].

## 2019-11-24 LAB — NUSWAB BV AND CANDIDA, NAA
Candida albicans, NAA: NEGATIVE
Candida glabrata, NAA: NEGATIVE

## 2019-12-05 ENCOUNTER — Ambulatory Visit: Payer: Medicare Other | Admitting: Obstetrics and Gynecology

## 2019-12-15 ENCOUNTER — Other Ambulatory Visit: Payer: Self-pay | Admitting: Adult Health

## 2020-01-03 ENCOUNTER — Ambulatory Visit: Payer: Medicare Other | Attending: Internal Medicine

## 2020-01-03 DIAGNOSIS — Z23 Encounter for immunization: Secondary | ICD-10-CM

## 2020-01-03 NOTE — Progress Notes (Signed)
   Covid-19 Vaccination Clinic  Name:  Bonnie West    MRN: [id number] DOB: 05/12/1951  01/03/2020  [name] was observed post Covid-19 immunization for 15 minutes without incidence. She was provided with Vaccine Information Sheet and instruction to access the V-Safe system.   [name] was instructed to call 911 with any severe reactions post vaccine: Marland Kitchen Difficulty breathing  . Swelling of your face and throat  . A fast heartbeat  . A bad rash all over your body  . Dizziness and weakness    Immunizations Administered    Name Date Dose VIS Date Route   Pfizer COVID-19 Vaccine 01/03/2020 11:26 AM 0.3 mL 11/01/2019 Intramuscular   Manufacturer: [place]   Lot: [id number]   [place]: [id number]

## 2020-01-09 IMAGING — DX DG HAND COMPLETE 3+V*R*
3 series · 3 of 3 positions shown · non-contrast
Comparison: None.

CLINICAL DATA: Right hand/thumb pain after fall going to the
bathroom tonight.

EXAM:
RIGHT HAND - COMPLETE 3+ VIEW

[hand ap]
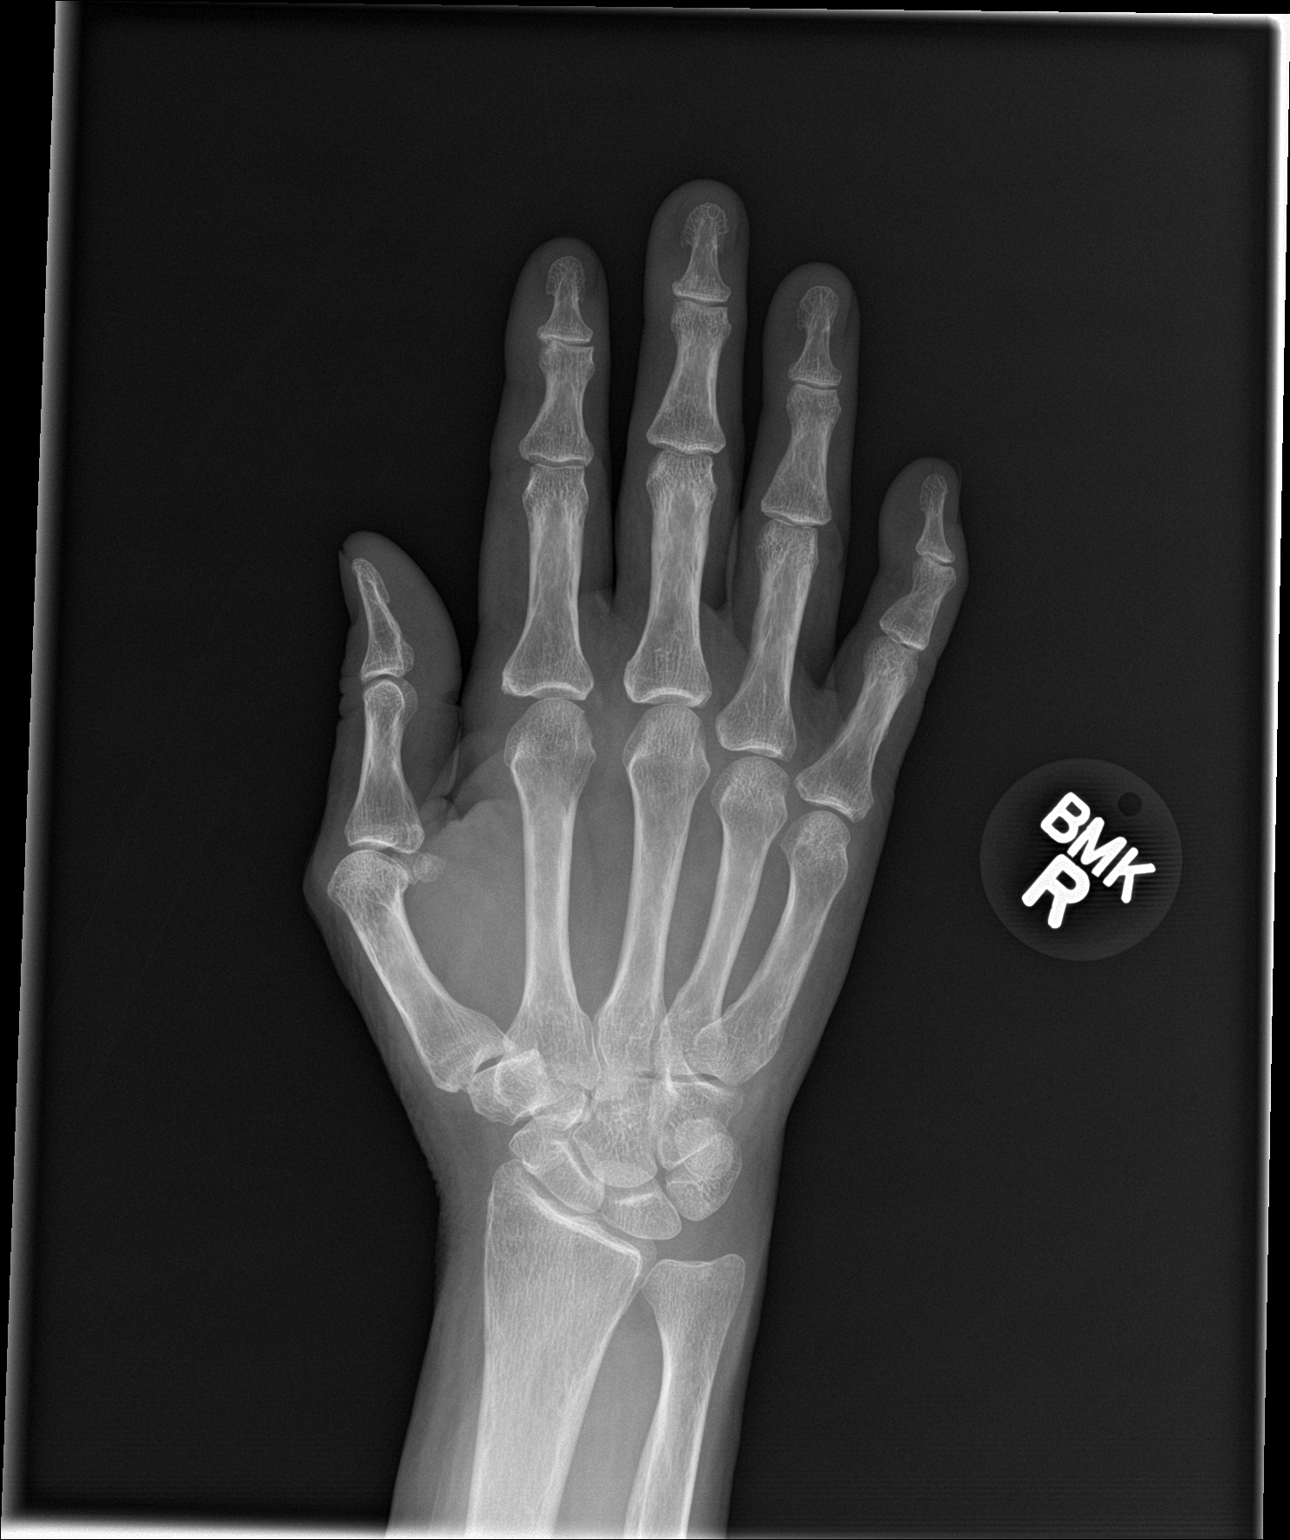

[hand obl]
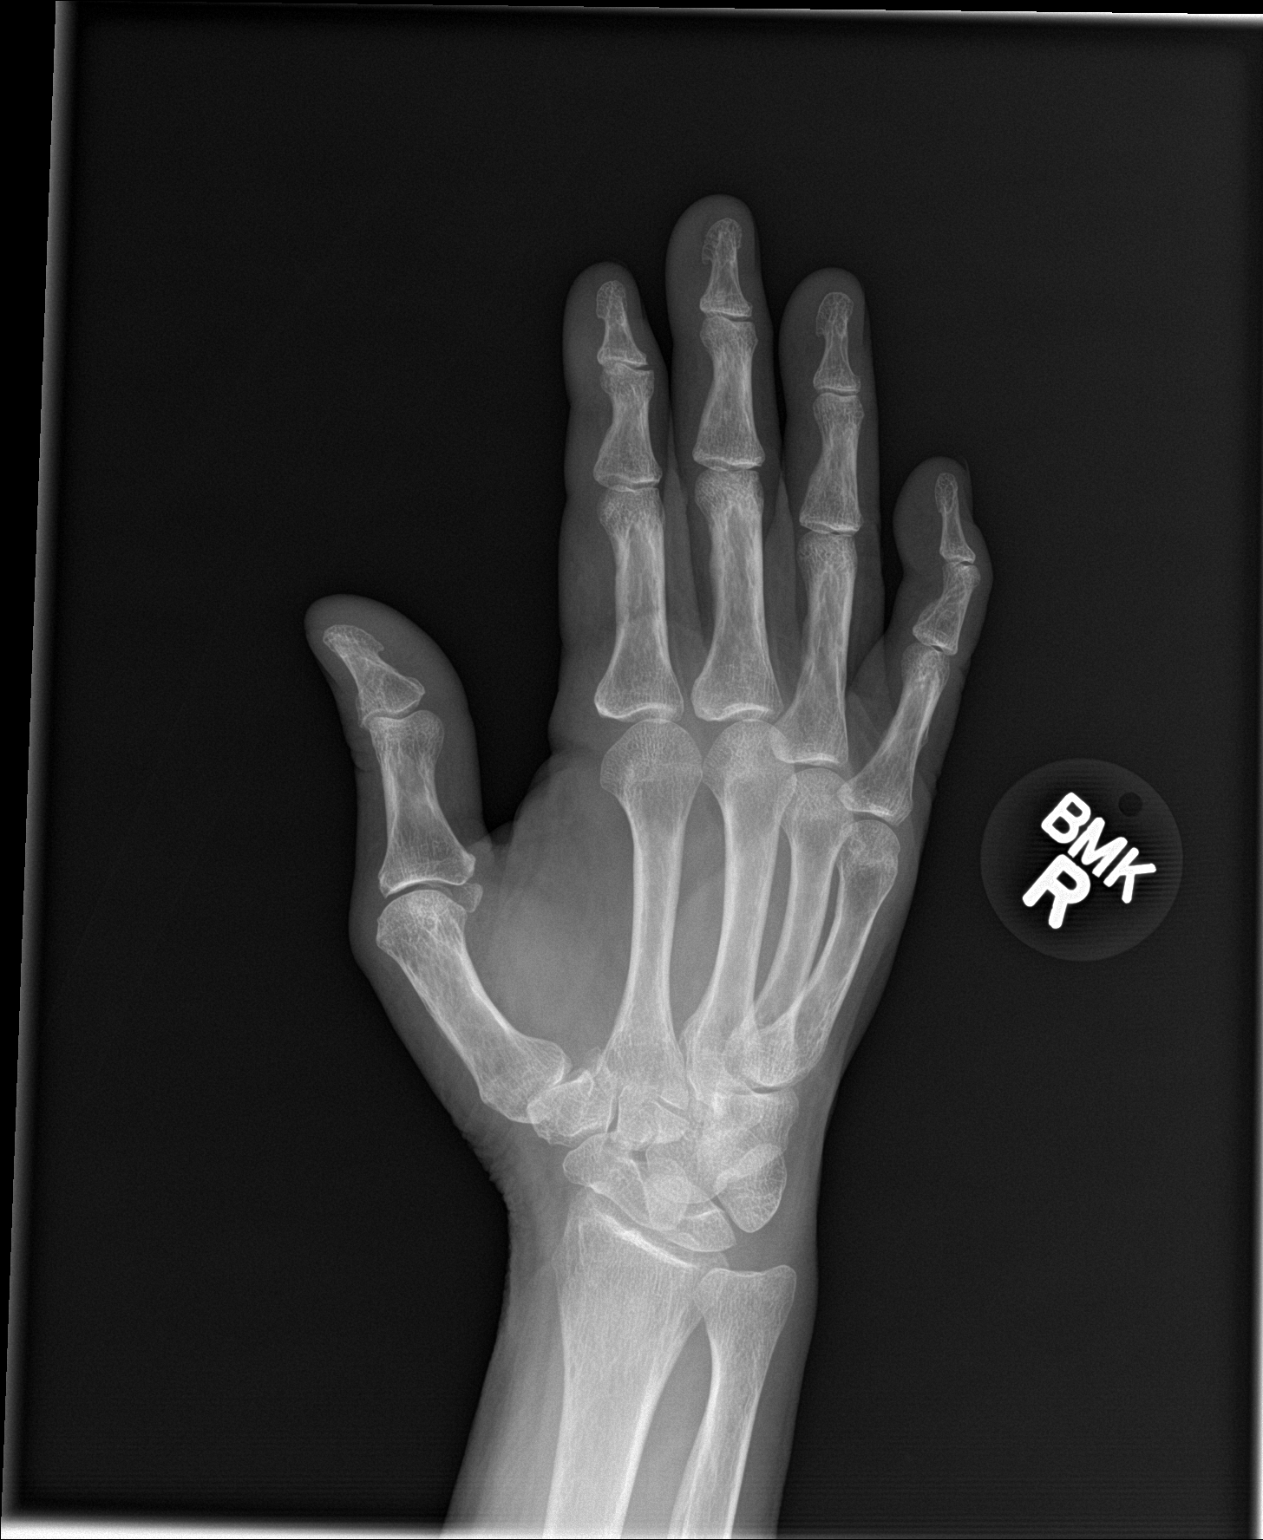

[hand lat]
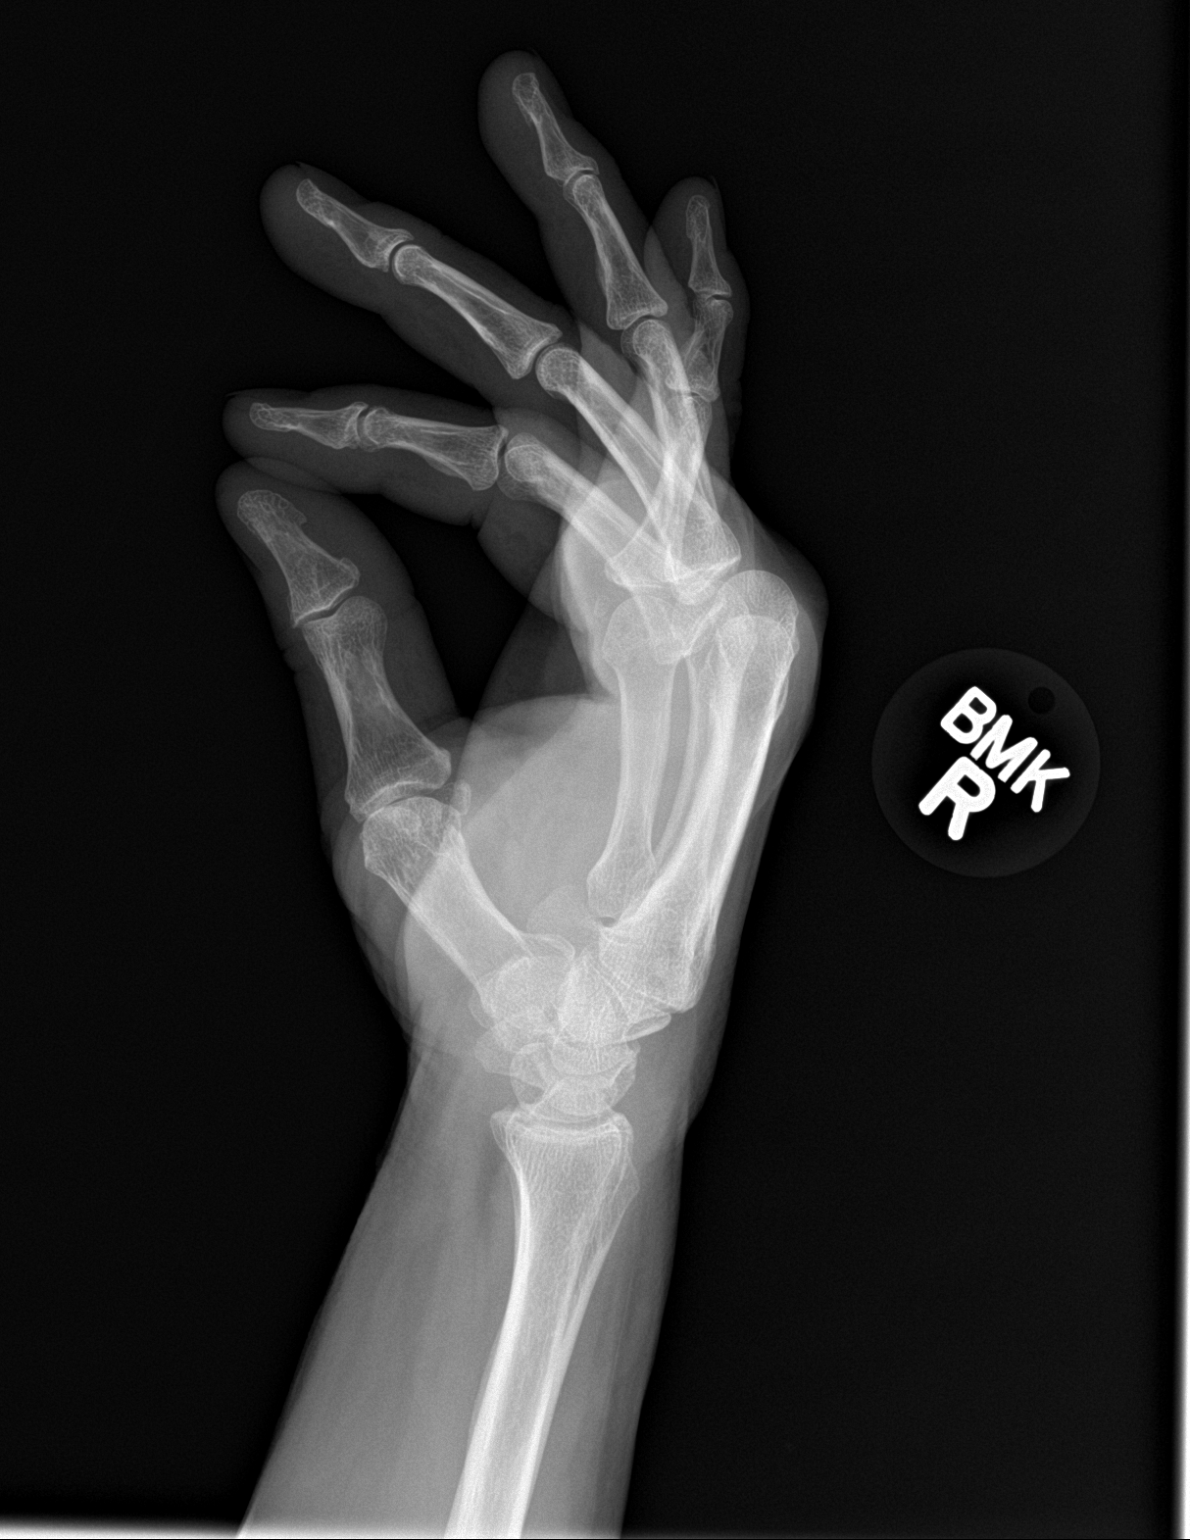

[3 of 3 positions shown; findings below may reference images not displayed]

FINDINGS: There is no evidence of fracture or dislocation. Mild scattered
osteoarthritis throughout the digits. Short fifth digit middle
phalanx, congenital. Soft tissues are unremarkable.
IMPRESSION: No fracture or subluxation of the right hand.

## 2020-01-10 ENCOUNTER — Other Ambulatory Visit: Payer: Self-pay

## 2020-01-10 DIAGNOSIS — F411 Generalized anxiety disorder: Secondary | ICD-10-CM

## 2020-01-12 IMAGING — CR DG CHEST 2V
1 series · 2 of 2 positions shown · non-contrast
Comparison: December 31, 2017

CLINICAL DATA: Cough and asthma.  hypertension.

EXAM:
CHEST  2 VIEW

[Series 1: dg chest 2 view · 0.14mm/px · 2 of 2 slices shown]
[im 1/2]
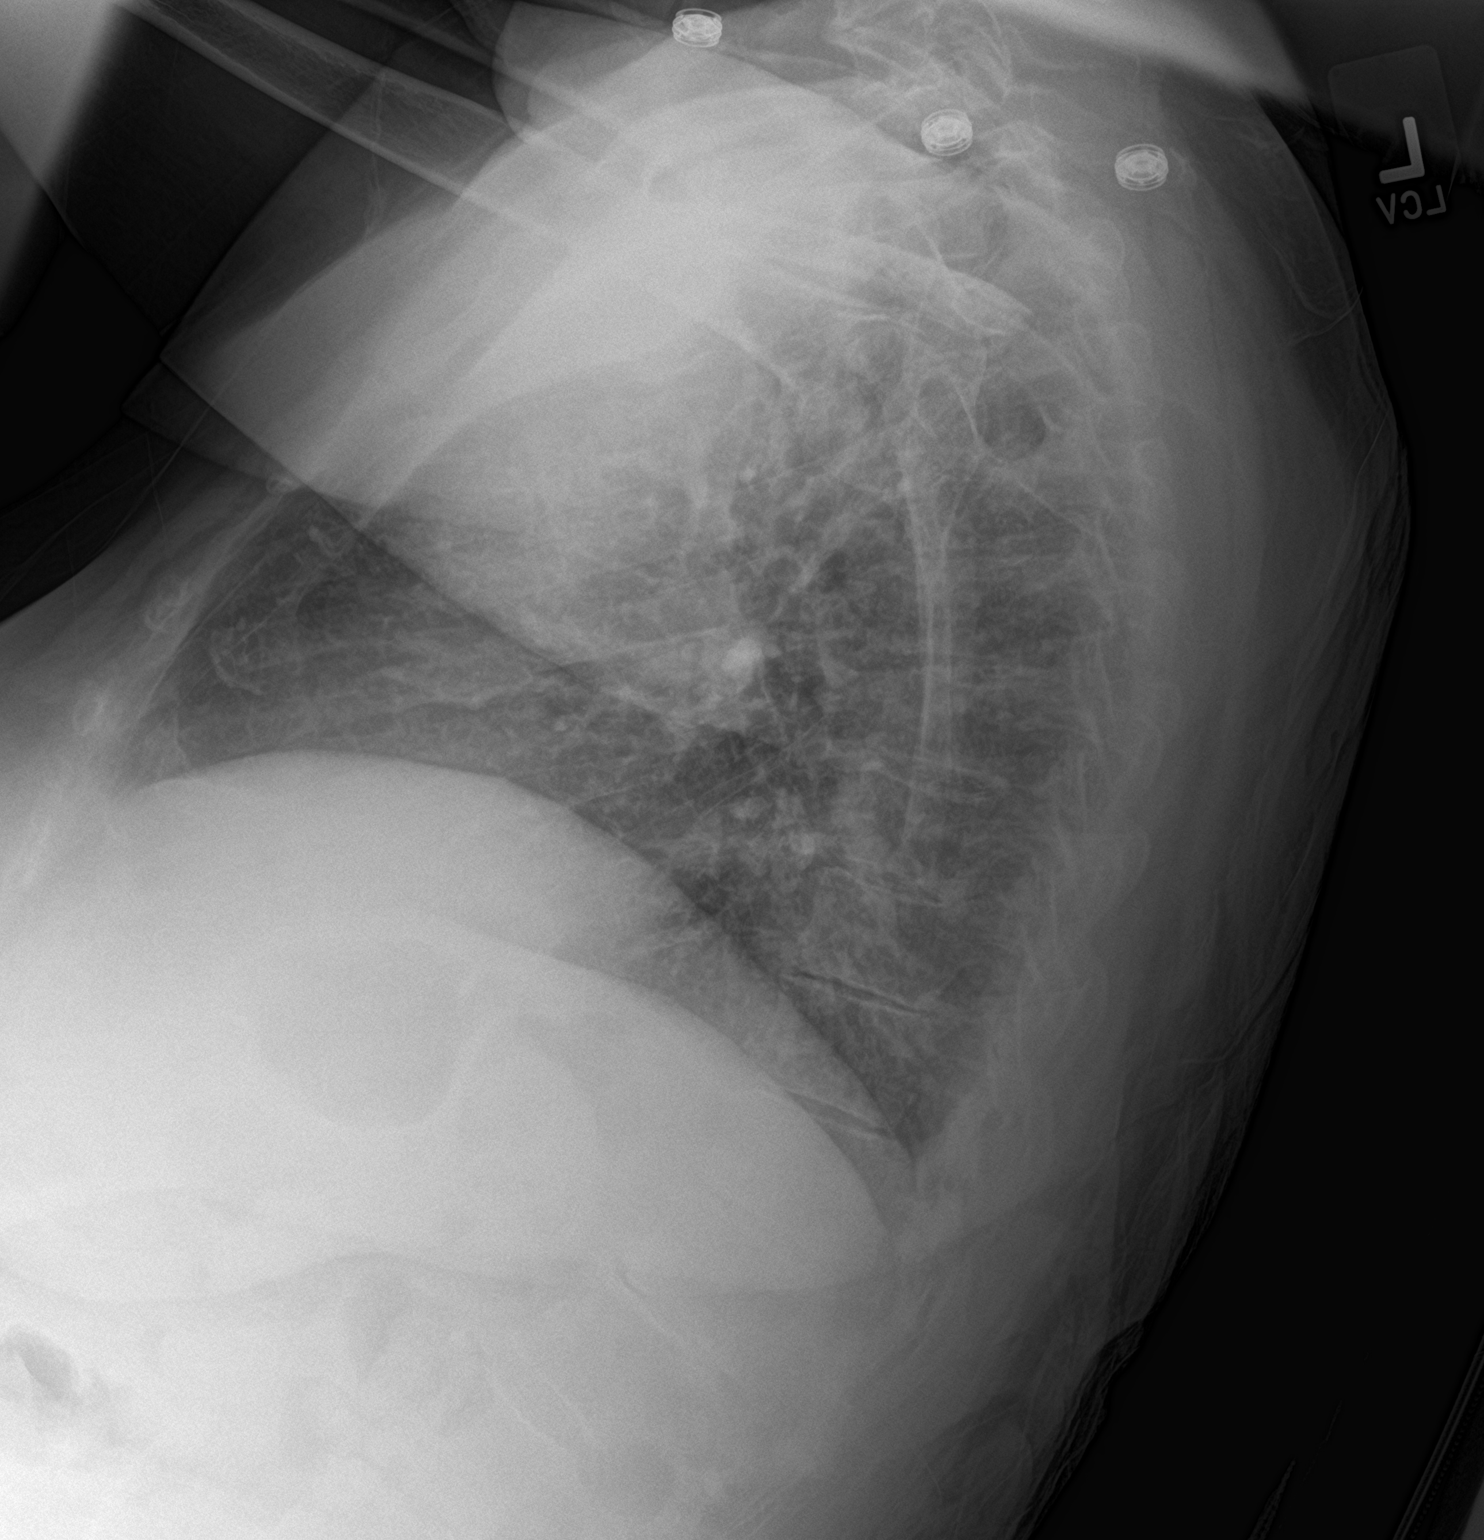
[im 2/2]
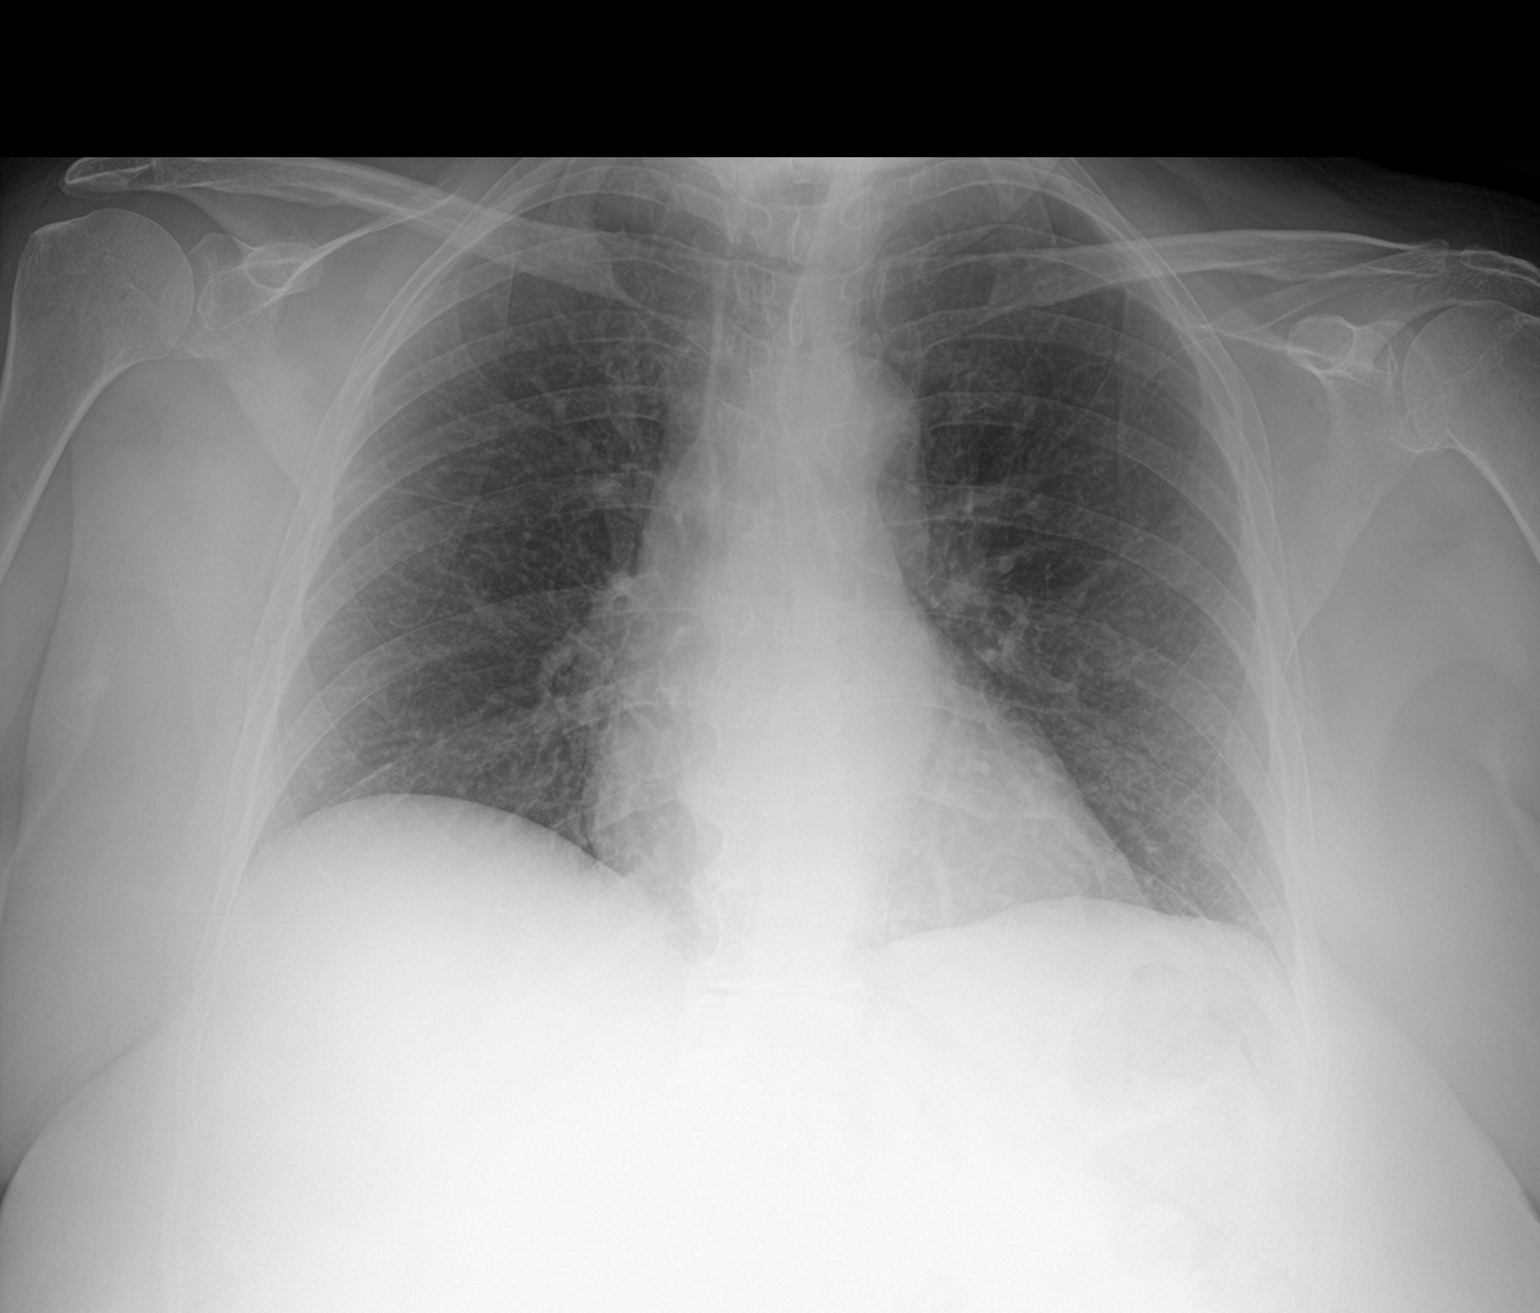

[2 of 2 positions shown; findings below may reference images not displayed]

FINDINGS: No edema or consolidation. The heart size and pulmonary vascularity
are normal. No adenopathy. No evident bone lesions.
IMPRESSION: No edema or consolidation.

## 2020-01-12 MED ORDER — ALPRAZOLAM 0.25 MG PO TABS: 0.2500 mg | ORAL_TABLET | Freq: Two times a day (BID) | ORAL | 2 refills | Status: DC | PRN

## 2020-01-13 ENCOUNTER — Other Ambulatory Visit: Payer: Self-pay

## 2020-01-13 MED ORDER — FENOFIBRATE 145 MG PO TABS: 145.0000 mg | ORAL_TABLET | Freq: Every day | ORAL | 5 refills | Status: DC

## 2020-01-15 ENCOUNTER — Other Ambulatory Visit: Payer: Self-pay

## 2020-01-15 MED ORDER — FENOFIBRATE 145 MG PO TABS: 145.0000 mg | ORAL_TABLET | Freq: Every day | ORAL | 5 refills | Status: DC

## 2020-01-29 ENCOUNTER — Ambulatory Visit: Payer: Medicare Other | Attending: Internal Medicine

## 2020-01-29 DIAGNOSIS — Z23 Encounter for immunization: Secondary | ICD-10-CM

## 2020-01-29 NOTE — Progress Notes (Signed)
   Covid-19 Vaccination Clinic  Name:  Bonnie West    MRN: [id number] DOB: Jun 05, 1951  01/29/2020  [name] was observed post Covid-19 immunization for 15 minutes without incident. She was provided with Vaccine Information Sheet and instruction to access the V-Safe system.   Ms. Socks was instructed to call 911 with any severe reactions post vaccine: Marland Kitchen Difficulty breathing  . Swelling of face and throat  . A fast heartbeat  . A bad rash all over body  . Dizziness and weakness   Immunizations Administered    Name Date Dose VIS Date Route   Pfizer COVID-19 Vaccine 01/29/2020 10:19 AM 0.3 mL 11/01/2019 Intramuscular   Manufacturer: [place]   Lot: [id number]   [place]: [id number]

## 2020-01-31 ENCOUNTER — Telehealth: Payer: Self-pay

## 2020-01-31 NOTE — Telephone Encounter (Signed)
CONFIRMED AND SCREENED FOR 02-04-20 OV. 

## 2020-02-04 ENCOUNTER — Ambulatory Visit (INDEPENDENT_AMBULATORY_CARE_PROVIDER_SITE_OTHER): Payer: Medicare Other | Admitting: Nurse Practitioner

## 2020-02-04 ENCOUNTER — Encounter: Payer: Self-pay | Admitting: Nurse Practitioner

## 2020-02-04 ENCOUNTER — Other Ambulatory Visit: Payer: Self-pay

## 2020-02-04 VITALS — BP 149/82 | HR 90 | Temp 97.6°F | Resp 16 | Ht 61.0 in | Wt 150.0 lb

## 2020-02-04 DIAGNOSIS — M545 Low back pain: Secondary | ICD-10-CM

## 2020-02-04 DIAGNOSIS — I10 Essential (primary) hypertension: Secondary | ICD-10-CM

## 2020-02-04 DIAGNOSIS — R300 Dysuria: Secondary | ICD-10-CM

## 2020-02-04 DIAGNOSIS — J0140 Acute pansinusitis, unspecified: Secondary | ICD-10-CM

## 2020-02-04 DIAGNOSIS — Z79899 Other long term (current) drug therapy: Secondary | ICD-10-CM | POA: Diagnosis not present

## 2020-02-04 DIAGNOSIS — G8929 Other chronic pain: Secondary | ICD-10-CM

## 2020-02-04 DIAGNOSIS — J4541 Moderate persistent asthma with (acute) exacerbation: Secondary | ICD-10-CM

## 2020-02-04 LAB — POCT URINE DRUG SCREEN
POC Amphetamine UR: NOT DETECTED
POC BENZODIAZEPINES UR: NOT DETECTED
POC Barbiturate UR: NOT DETECTED
POC Cocaine UR: NOT DETECTED
POC Ecstasy UR: NOT DETECTED
POC Marijuana UR: NOT DETECTED
POC Methadone UR: NOT DETECTED
POC Methamphetamine UR: NOT DETECTED
POC Opiate Ur: NOT DETECTED
POC Oxycodone UR: NOT DETECTED
POC PHENCYCLIDINE UR: NOT DETECTED
POC TRICYCLICS UR: POSITIVE — AB

## 2020-02-04 LAB — POCT URINALYSIS DIPSTICK
Bilirubin, UA: NEGATIVE
Blood, UA: NEGATIVE
Glucose, UA: NEGATIVE
Ketones, UA: NEGATIVE
Leukocytes, UA: NEGATIVE
Nitrite, UA: NEGATIVE
Protein, UA: NEGATIVE
Spec Grav, UA: 1.01 (ref 1.010–1.025)
Urobilinogen, UA: 0.2 E.U./dL
pH, UA: 6 (ref 5.0–8.0)

## 2020-02-04 MED ORDER — AZITHROMYCIN 250 MG PO TABS: ORAL_TABLET | ORAL | 0 refills | Status: DC

## 2020-02-04 MED ORDER — HYDROCODONE-ACETAMINOPHEN 5-300 MG PO TABS: 1.0000 | ORAL_TABLET | Freq: Two times a day (BID) | ORAL | 0 refills | Status: DC | PRN

## 2020-02-04 MED ORDER — AMLODIPINE BESYLATE 5 MG PO TABS: 5.0000 mg | ORAL_TABLET | Freq: Every day | ORAL | 1 refills | Status: DC

## 2020-02-04 NOTE — Progress Notes (Signed)
[facility] [place], [place] [address]  Internal MEDICINE  Office Visit Note  Patient Name: Bonnie West  [id number]  [id number]  Date of Service: 02/12/2020  Chief Complaint  Patient presents with  . Hypertension  . Hyperlipidemia  . Gastroesophageal Reflux  . Hypothyroidism  . Cough    happened the last three nights and inhaler eased the cough   . Labs Only    did not do labs   . Back Pain    lower back   . Hip Pain  . Quality Metric Gaps    mammogram    The patient is here for routine follow up. She states that she has developed a cough over the past few nights. Cough is waking her up and keeping her up at night. She states that, yesterday, she felt bad. She was congested, headache, sinus pressure, and post nasal drip. She did get both COVID 19 vaccines. These are documented in her immunization history. She states that all the coughing has started to cause her low back and left hip pain. Usually has pessary, but this is currently out, as she had rubbed a sore spot in the urethra. GYN provider recommended it stay out for a little while. Urinalysis does not indicate any infection today or other abnormalities in the urine.       Current Medication: Outpatient Encounter Medications as of 02/04/2020  Medication Sig  . acetaminophen (TYLENOL) 500 MG tablet Take 1,000 mg by mouth 2 (two) times daily as needed for mild pain or moderate pain.  Marland Kitchen albuterol (PROVENTIL) (2.5 MG/3ML) 0.083% nebulizer solution Take 3 mLs (2.5 mg total) by nebulization every 6 (six) hours as needed for wheezing or shortness of breath.  . ALPRAZolam (XANAX) 0.25 MG tablet Take 1 tablet (0.25 mg total) by mouth 2 (two) times daily as needed for anxiety.  Marland Kitchen amLODipine (NORVASC) 5 MG tablet Take 1 tablet (5 mg total) by mouth daily.  . bisacodyl (DULCOLAX) 5 MG EC tablet Take 5-10 mg by mouth at bedtime as needed for moderate constipation. Two at night  . cetirizine (ZYRTEC) 10 MG  tablet Take 10 mg by mouth daily.  . cholecalciferol (VITAMIN D) 1000 units tablet Take 1,000 Units by mouth at bedtime.  . clindamycin (CLEOCIN T) 1 % external solution Apply 1 application topically 2 (two) times daily as needed. For skin breakdown  . cyclobenzaprine (FLEXERIL) 5 MG tablet Take 1-2 tablets (5-10 mg total) by mouth 2 (two) times daily as needed for muscle spasms.  Marland Kitchen docusate sodium (COLACE) 100 MG capsule Take 100-200 mg by mouth at bedtime as needed for mild constipation or moderate constipation.   Marland Kitchen estradiol (ESTRACE) 0.1 MG/GM vaginal cream INSERT 1 GRAM VAGINALLY TWICE WEEKLY AT BEDTIME  . fenofibrate (TRICOR) 145 MG tablet Take 1 tablet (145 mg total) by mouth daily.  Marland Kitchen FLUoxetine (PROZAC) 40 MG capsule TAKE 1 CAPSULE BY MOUTH EVERY DAY  . HYDROcodone-Acetaminophen 5-300 MG TABS Take 1 tablet by mouth 2 (two) times daily as needed. For severe pain  . ipratropium (ATROVENT) 0.02 % nebulizer solution Use 1 vial nebulized QID prn shortness of breath/wheezing.  . isometheptene-acetaminophen-dichloralphenazone (MIDRIN) 65-100-325 MG capsule Take 1 capsule by mouth 4 (four) times daily as needed for migraine. Maximum 5 capsules in 12 hours for migraine headaches, 8 capsules in 24 hours for tension headaches.  . levothyroxine (SYNTHROID, LEVOTHROID) 100 MCG tablet TAKE 1 TABLET BY MOUTH EVERY DAY IN THE MORNING  . lisinopril (  ZESTRIL) 10 MG tablet Take 1 tablet (10 mg total) by mouth daily.  . magnesium oxide (MAG-OX) 400 MG tablet Take 400 mg by mouth at bedtime.   . montelukast (SINGULAIR) 10 MG tablet Take 1 tablet (10 mg total) by mouth daily.  Marland Kitchen omeprazole (PRILOSEC) 40 MG capsule TAKE 1 CAPSULE BY MOUTH EVERY DAY  . polyethylene glycol powder (GLYCOLAX/MIRALAX) 17 GM/SCOOP powder Take 17 g by mouth every evening.  . triamcinolone cream (KENALOG) 0.1 % Apply 1 application topically 2 (two) times daily as needed. For skin breakdown  . vitamin B-12 (CYANOCOBALAMIN) 1000 MCG  tablet Take 1,000 mcg by mouth daily.  . [DISCONTINUED] albuterol (VENTOLIN HFA) 108 (90 Base) MCG/ACT inhaler INHALE 2 PUFFS BY MOUTH EVERY 6 HOURS  . [DISCONTINUED] amLODipine (NORVASC) 5 MG tablet TAKE 1 TABLET BY MOUTH EVERY DAY  . [DISCONTINUED] ciprofloxacin (CIPRO) 500 MG tablet Take 1 tablet (500 mg total) by mouth 2 (two) times daily. For UTI  . [DISCONTINUED] FLOVENT HFA 110 MCG/ACT inhaler TAKE 1 PUFF BY MOUTH TWICE A DAY  . [DISCONTINUED] HYDROcodone-Acetaminophen 5-300 MG TABS Take 1 tablet by mouth 2 (two) times daily as needed. For severe pain  . [DISCONTINUED] ipratropium (ATROVENT HFA) 17 MCG/ACT inhaler INHALE 2 PUFFS 4 TIMES DAILY  . azithromycin (ZITHROMAX) 250 MG tablet z-pack - take as directed for 5 days (Patient not taking: Reported on 02/11/2020)   No facility-administered encounter medications on file as of 02/04/2020.    Surgical History: Past Surgical History:  Procedure Laterality Date  . BUNIONECTOMY    . CATARACT EXTRACTION W/PHACO Right 10/11/2016   Procedure: CATARACT EXTRACTION PHACO AND INTRAOCULAR LENS PLACEMENT (IOC);  Surgeon: Birder Robson, MD;  Location: [facility] ORS;  Service: Ophthalmology;  Laterality: Right;  Lot# [id number] H [place]: 00:37.7 AP%: 18.1 CDE: 6.80  . CATARACT EXTRACTION W/PHACO Left 11/08/2016   Procedure: CATARACT EXTRACTION PHACO AND INTRAOCULAR LENS PLACEMENT (IOC);  Surgeon: Birder Robson, MD;  Location: [facility] ORS;  Service: Ophthalmology;  Laterality: Left;  PACK LOT: [id number] H US:00:32 AP:44 CDE:6.46  . COLONOSCOPY    . COLONOSCOPY WITH PROPOFOL N/A 11/26/2015   Procedure: COLONOSCOPY WITH PROPOFOL;  Surgeon: Manya Silvas, MD;  Location: [facility] ENDOSCOPY;  Service: Endoscopy;  Laterality: N/A;  . EYE SURGERY    . TONSILLECTOMY      Medical History: Past Medical History:  Diagnosis Date  . Allergic rhinitis   . Anxiety   . Asthma   . Blood transfusion without reported diagnosis   . Depression   . Diverticulosis   .  Dyspnea   . Esophagitis   . GERD (gastroesophageal reflux disease)   . Headache   . Heart murmur   . Hyperlipemia   . Hypertension   . Hypothyroidism   . Obesity   . Osteopenia   . Palpitations   . Pneumothorax   . Sleep apnea     Family History: Family History  Problem Relation Age of Onset  . Breast cancer Maternal Grandmother   . Stroke Maternal Grandmother   . Bladder Cancer Father   . Prostate cancer Father   . Heart attack Father   . Aortic aneurysm Father   . Congestive Heart Failure Father   . Colon cancer Paternal Grandmother   . Aneurysm Paternal Grandmother   . Stroke Paternal Grandmother   . Congestive Heart Failure Paternal Grandmother   . Dementia Mother   . Stroke Mother   . Dementia Maternal Grandfather   . Stroke Maternal Grandfather   .  Dementia Paternal Grandfather   . Stroke Paternal Grandfather     Social History   Socioeconomic History  . Marital status: Married    Spouse name: Not on file  . Number of children: Not on file  . Years of education: Not on file  . Highest education level: Not on file  Occupational History  . Not on file  Tobacco Use  . Smoking status: Former Research scientist (life sciences)  . Smokeless tobacco: Never Used  Substance and Sexual Activity  . Alcohol use: No  . Drug use: No  . Sexual activity: Not Currently    Partners: Male    Birth control/protection: Post-menopausal  Other Topics Concern  . Not on file  Social History Narrative  . Not on file   Social Determinants of Health   Financial Resource Strain:   . Difficulty of Paying Living Expenses:   Food Insecurity:   . Worried About Charity fundraiser in the Last Year:   . Arboriculturist in the Last Year:   Transportation Needs:   . Film/video editor (Medical):   Marland Kitchen Lack of Transportation (Non-Medical):   Physical Activity:   . Days of Exercise per Week:   . Minutes of Exercise per Session:   Stress:   . Feeling of Stress :   Social Connections:   . Frequency  of Communication with Friends and Family:   . Frequency of Social Gatherings with Friends and Family:   . Attends Religious Services:   . Active Member of Clubs or Organizations:   . Attends Archivist Meetings:   Marland Kitchen Marital Status:   Intimate Partner Violence:   . Fear of Current or Ex-Partner:   . Emotionally Abused:   Marland Kitchen Physically Abused:   . Sexually Abused:       Review of Systems  Constitutional: Positive for fatigue. Negative for activity change, chills and unexpected weight change.  HENT: Positive for congestion, postnasal drip, rhinorrhea, sinus pressure, sinus pain and sore throat. Negative for sneezing and voice change.   Respiratory: Positive for cough and wheezing. Negative for chest tightness and shortness of breath.        Cough most severe at night. Starting to wheeze more often.   Cardiovascular: Negative for chest pain and palpitations.  Gastrointestinal: Negative for abdominal pain, constipation, diarrhea, nausea and vomiting.  Endocrine: Negative for cold intolerance, heat intolerance, polydipsia and polyuria.  Genitourinary: Positive for flank pain. Negative for dysuria, frequency and hematuria.       Patient has bladder prolapse.   Musculoskeletal: Positive for arthralgias and joint swelling. Negative for back pain and neck pain.       Intermittent lower back pain.   Skin: Negative for rash.  Allergic/Immunologic: Positive for environmental allergies.  Neurological: Negative for dizziness, tremors, numbness and headaches.  Hematological: Negative for adenopathy. Does not bruise/bleed easily.  Psychiatric/Behavioral: Positive for dysphoric mood. Negative for behavioral problems (Depression), sleep disturbance and suicidal ideas. The patient is nervous/anxious.        Patient feels as though pandemic has increased her anxiety/depression. She has definitely been chewing and picking her nails more often.     Today's Vitals   02/04/20 1352  BP: (!)  149/82  Pulse: 90  Resp: 16  Temp: 97.6 F (36.4 C)  SpO2: 97%  Weight: 150 lb (68 kg)  Height: 5\' 1"  (1.549 m)   Body mass index is 28.34 kg/m.  Physical Exam Vitals and nursing note reviewed.  Constitutional:  General: She is not in acute distress.    Appearance: Normal appearance. She is well-developed. She is ill-appearing. She is not diaphoretic.  HENT:     Head: Normocephalic and atraumatic.     Nose: Congestion present.     Right Sinus: Frontal sinus tenderness present.     Left Sinus: Frontal sinus tenderness present.     Mouth/Throat:     Pharynx: No oropharyngeal exudate.  Eyes:     Pupils: Pupils are equal, round, and reactive to light.  Neck:     Thyroid: No thyromegaly.     Vascular: No JVD.     Trachea: No tracheal deviation.  Cardiovascular:     Rate and Rhythm: Normal rate and regular rhythm.     Heart sounds: Normal heart sounds. No murmur. No friction rub. No gallop.   Pulmonary:     Effort: Pulmonary effort is normal. No respiratory distress.     Breath sounds: Wheezing present. No rales.     Comments: Intermittent dry, non-productive cough present.  Chest:     Chest wall: No tenderness.  Abdominal:     General: Bowel sounds are normal.     Palpations: Abdomen is soft.     Tenderness: There is no abdominal tenderness.  Genitourinary:    Comments: Urine sample negative for evidence of infection or other abnormalities.  Musculoskeletal:        General: Normal range of motion.     Cervical back: Normal range of motion and neck supple.  Lymphadenopathy:     Cervical: No cervical adenopathy.  Skin:    General: Skin is warm and dry.  Neurological:     General: No focal deficit present.     Mental Status: She is alert and oriented to person, place, and time.     Cranial Nerves: No cranial nerve deficit.  Psychiatric:        Attention and Perception: Attention and perception normal.        Mood and Affect: Affect normal. Mood is anxious. Mood  is not depressed.        Speech: Speech normal.        Behavior: Behavior normal. Behavior is cooperative.        Thought Content: Thought content normal.        Cognition and Memory: Cognition and memory normal.        Judgment: Judgment normal.   Assessment/Plan: 1. Essential hypertension, benign Stable. Continue bp medication as prescribed  - amLODipine (NORVASC) 5 MG tablet; Take 1 tablet (5 mg total) by mouth daily.  Dispense: 90 tablet; Refill: 1  2. Acute non-recurrent pansinusitis Add z-pak. Take as directedfor 5 days. Rest and increase fluids. Use OTC medications as needed and as indicated to treat acute symptoms  - azithromycin (ZITHROMAX) 250 MG tablet; z-pack - take as directed for 5 days (Patient not taking: Reported on 02/11/2020)  Dispense: 6 tablet; Refill: 0  3. Moderate persistent asthma without status asthmaticus with acute exacerbation Use inhalers and respiratory treatments as needed and as prescribed   4. Chronic midline low back pain without sciatica May take hydrocodone/APAP 5/300mg  tablets as needed and as prescribed. Prescription for #15 tablets sent to her pharmacy.  - HYDROcodone-Acetaminophen 5-300 MG TABS; Take 1 tablet by mouth 2 (two) times daily as needed. For severe pain  Dispense: 15 tablet; Refill: 0  5. Dysuria - POCT Urinalysis Dipstick showing no evidence of infection or other abnormalities.   6. Encounter for long-term (current) use  of medications - POCT Urine Drug Screen positive for TCA only.   General Counseling: lyllie lograsso understanding of the findings of todays visit and agrees with plan of treatment. I have discussed any further diagnostic evaluation that may be needed or ordered today. We also reviewed her medications today. she has been encouraged to call the office with any questions or concerns that should arise related to todays visit.  This patient was seen by Leretha Pol FNP Collaboration with Dr Lavera Guise as a part of  collaborative care agreement  Orders Placed This Encounter  Procedures  . POCT Urine Drug Screen  . POCT Urinalysis Dipstick    Meds ordered this encounter  Medications  . amLODipine (NORVASC) 5 MG tablet    Sig: Take 1 tablet (5 mg total) by mouth daily.    Dispense:  90 tablet    Refill:  1    Order Specific Question:   Supervising Provider    Answer:   Lavera Guise [id number]  . HYDROcodone-Acetaminophen 5-300 MG TABS    Sig: Take 1 tablet by mouth 2 (two) times daily as needed. For severe pain    Dispense:  15 tablet    Refill:  0    Order Specific Question:   Supervising Provider    Answer:   Lavera Guise [id number]  . azithromycin (ZITHROMAX) 250 MG tablet    Sig: z-pack - take as directed for 5 days    Dispense:  6 tablet    Refill:  0    Order Specific Question:   Supervising Provider    Answer:   Lavera Guise [id number]    Total time spent: 30 Minutes   Time spent includes review of chart, medications, test results, and follow up plan with the patient.      Dr Lavera Guise Internal medicine

## 2020-02-07 ENCOUNTER — Telehealth: Payer: Self-pay

## 2020-02-07 NOTE — Telephone Encounter (Signed)
Done by mistake

## 2020-02-07 NOTE — Telephone Encounter (Signed)
Confirmed appointment on 02/11/2020 and screened for covid. [facility] 

## 2020-02-11 ENCOUNTER — Ambulatory Visit (INDEPENDENT_AMBULATORY_CARE_PROVIDER_SITE_OTHER): Payer: Medicare Other | Admitting: Internal Medicine

## 2020-02-11 ENCOUNTER — Encounter: Payer: Self-pay | Admitting: Internal Medicine

## 2020-02-11 ENCOUNTER — Other Ambulatory Visit: Payer: Self-pay

## 2020-02-11 VITALS — BP 133/84 | HR 83 | Resp 16 | Ht 61.0 in | Wt 147.0 lb

## 2020-02-11 DIAGNOSIS — G4733 Obstructive sleep apnea (adult) (pediatric): Secondary | ICD-10-CM

## 2020-02-11 DIAGNOSIS — J4541 Moderate persistent asthma with (acute) exacerbation: Secondary | ICD-10-CM | POA: Diagnosis not present

## 2020-02-11 DIAGNOSIS — J4540 Moderate persistent asthma, uncomplicated: Secondary | ICD-10-CM

## 2020-02-11 DIAGNOSIS — J301 Allergic rhinitis due to pollen: Secondary | ICD-10-CM

## 2020-02-11 DIAGNOSIS — K219 Gastro-esophageal reflux disease without esophagitis: Secondary | ICD-10-CM

## 2020-02-11 MED ORDER — ATROVENT HFA 17 MCG/ACT IN AERS: INHALATION_SPRAY | RESPIRATORY_TRACT | 5 refills | Status: DC

## 2020-02-11 MED ORDER — FLOVENT HFA 110 MCG/ACT IN AERO: INHALATION_SPRAY | RESPIRATORY_TRACT | 3 refills | Status: DC

## 2020-02-11 MED ORDER — ALBUTEROL SULFATE HFA 108 (90 BASE) MCG/ACT IN AERS: INHALATION_SPRAY | RESPIRATORY_TRACT | 5 refills | Status: DC

## 2020-02-11 NOTE — Progress Notes (Signed)
[facility], [place] [address]  Pulmonary Sleep Medicine   Office Visit Note  Patient Name: Bonnie West DOB: 05-Aug-1951 MRN [id number]  Date of Service: 02/11/2020  Complaints/HPI: Pt is here for pulmonary follow up.  She has a history of asthma, osa and allergies.  She reports she is doing well.  She was seen last week for a cough.  She was treated for sinus infection and she completed a z pak.  She has a lingering cough.  Denies fever or chills. She currently uses flovent, Atrovent and albuterol.  ROS  General: (-) fever, (-) chills, (-) night sweats, (-) weakness Skin: (-) rashes, (-) itching,. Eyes: (-) visual changes, (-) redness, (-) itching. Nose and Sinuses: (-) nasal stuffiness or itchiness, (-) postnasal drip, (-) nosebleeds, (-) sinus trouble. Mouth and Throat: (-) sore throat, (-) hoarseness. Neck: (-) swollen glands, (-) enlarged thyroid, (-) neck pain. Respiratory: + cough, (-) bloody sputum, - shortness of breath, - wheezing. Cardiovascular: - ankle swelling, (-) chest pain. Lymphatic: (-) lymph node enlargement. Neurologic: (-) numbness, (-) tingling. Psychiatric: (-) anxiety, (-) depression   Current Medication: Outpatient Encounter Medications as of 02/11/2020  Medication Sig  . acetaminophen (TYLENOL) 500 MG tablet Take 1,000 mg by mouth 2 (two) times daily as needed for mild pain or moderate pain.  Marland Kitchen albuterol (PROVENTIL) (2.5 MG/3ML) 0.083% nebulizer solution Take 3 mLs (2.5 mg total) by nebulization every 6 (six) hours as needed for wheezing or shortness of breath.  Marland Kitchen albuterol (VENTOLIN HFA) 108 (90 Base) MCG/ACT inhaler INHALE 2 PUFFS BY MOUTH EVERY 6 HOURS  . ALPRAZolam (XANAX) 0.25 MG tablet Take 1 tablet (0.25 mg total) by mouth 2 (two) times daily as needed for anxiety.  Marland Kitchen amLODipine (NORVASC) 5 MG tablet Take 1 tablet (5 mg total) by mouth daily.  . bisacodyl (DULCOLAX) 5 MG EC tablet Take 5-10 mg by mouth at  bedtime as needed for moderate constipation. Two at night  . cetirizine (ZYRTEC) 10 MG tablet Take 10 mg by mouth daily.  . cholecalciferol (VITAMIN D) 1000 units tablet Take 1,000 Units by mouth at bedtime.  . clindamycin (CLEOCIN T) 1 % external solution Apply 1 application topically 2 (two) times daily as needed. For skin breakdown  . cyclobenzaprine (FLEXERIL) 5 MG tablet Take 1-2 tablets (5-10 mg total) by mouth 2 (two) times daily as needed for muscle spasms.  Marland Kitchen docusate sodium (COLACE) 100 MG capsule Take 100-200 mg by mouth at bedtime as needed for mild constipation or moderate constipation.   Marland Kitchen estradiol (ESTRACE) 0.1 MG/GM vaginal cream INSERT 1 GRAM VAGINALLY TWICE WEEKLY AT BEDTIME  . fenofibrate (TRICOR) 145 MG tablet Take 1 tablet (145 mg total) by mouth daily.  Marland Kitchen FLOVENT HFA 110 MCG/ACT inhaler TAKE 1 PUFF BY MOUTH TWICE A DAY  . FLUoxetine (PROZAC) 40 MG capsule TAKE 1 CAPSULE BY MOUTH EVERY DAY  . HYDROcodone-Acetaminophen 5-300 MG TABS Take 1 tablet by mouth 2 (two) times daily as needed. For severe pain  . ipratropium (ATROVENT HFA) 17 MCG/ACT inhaler INHALE 2 PUFFS 4 TIMES DAILY  . ipratropium (ATROVENT) 0.02 % nebulizer solution Use 1 vial nebulized QID prn shortness of breath/wheezing.  . isometheptene-acetaminophen-dichloralphenazone (MIDRIN) 65-100-325 MG capsule Take 1 capsule by mouth 4 (four) times daily as needed for migraine. Maximum 5 capsules in 12 hours for migraine headaches, 8 capsules in 24 hours for tension headaches.  . levothyroxine (SYNTHROID, LEVOTHROID) 100 MCG tablet TAKE 1 TABLET BY  MOUTH EVERY DAY IN THE MORNING  . lisinopril (ZESTRIL) 10 MG tablet Take 1 tablet (10 mg total) by mouth daily.  . magnesium oxide (MAG-OX) 400 MG tablet Take 400 mg by mouth at bedtime.   . montelukast (SINGULAIR) 10 MG tablet Take 1 tablet (10 mg total) by mouth daily.  Marland Kitchen omeprazole (PRILOSEC) 40 MG capsule TAKE 1 CAPSULE BY MOUTH EVERY DAY  . polyethylene glycol powder  (GLYCOLAX/MIRALAX) 17 GM/SCOOP powder Take 17 g by mouth every evening.  . triamcinolone cream (KENALOG) 0.1 % Apply 1 application topically 2 (two) times daily as needed. For skin breakdown  . vitamin B-12 (CYANOCOBALAMIN) 1000 MCG tablet Take 1,000 mcg by mouth daily.  Marland Kitchen azithromycin (ZITHROMAX) 250 MG tablet z-pack - take as directed for 5 days (Patient not taking: Reported on 02/11/2020)   No facility-administered encounter medications on file as of 02/11/2020.    Surgical History: Past Surgical History:  Procedure Laterality Date  . BUNIONECTOMY    . CATARACT EXTRACTION W/PHACO Right 10/11/2016   Procedure: CATARACT EXTRACTION PHACO AND INTRAOCULAR LENS PLACEMENT (IOC);  Surgeon: Birder Robson, MD;  Location: [facility] ORS;  Service: Ophthalmology;  Laterality: Right;  Lot# [id number] H [place]: 00:37.7 AP%: 18.1 CDE: 6.80  . CATARACT EXTRACTION W/PHACO Left 11/08/2016   Procedure: CATARACT EXTRACTION PHACO AND INTRAOCULAR LENS PLACEMENT (IOC);  Surgeon: Birder Robson, MD;  Location: [facility] ORS;  Service: Ophthalmology;  Laterality: Left;  PACK LOT: [id number] H US:00:32 AP:44 CDE:6.46  . COLONOSCOPY    . COLONOSCOPY WITH PROPOFOL N/A 11/26/2015   Procedure: COLONOSCOPY WITH PROPOFOL;  Surgeon: Manya Silvas, MD;  Location: [facility] ENDOSCOPY;  Service: Endoscopy;  Laterality: N/A;  . EYE SURGERY    . TONSILLECTOMY      Medical History: Past Medical History:  Diagnosis Date  . Allergic rhinitis   . Anxiety   . Asthma   . Blood transfusion without reported diagnosis   . Depression   . Diverticulosis   . Dyspnea   . Esophagitis   . GERD (gastroesophageal reflux disease)   . Headache   . Heart murmur   . Hyperlipemia   . Hypertension   . Hypothyroidism   . Obesity   . Osteopenia   . Palpitations   . Pneumothorax   . Sleep apnea     Family History: Family History  Problem Relation Age of Onset  . Breast cancer Maternal Grandmother   . Stroke Maternal Grandmother   . Bladder  Cancer Father   . Prostate cancer Father   . Heart attack Father   . Aortic aneurysm Father   . Congestive Heart Failure Father   . Colon cancer Paternal Grandmother   . Aneurysm Paternal Grandmother   . Stroke Paternal Grandmother   . Congestive Heart Failure Paternal Grandmother   . Dementia Mother   . Stroke Mother   . Dementia Maternal Grandfather   . Stroke Maternal Grandfather   . Dementia Paternal Grandfather   . Stroke Paternal Grandfather     Social History: Social History   Socioeconomic History  . Marital status: Married    Spouse name: Not on file  . Number of children: Not on file  . Years of education: Not on file  . Highest education level: Not on file  Occupational History  . Not on file  Tobacco Use  . Smoking status: Former Research scientist (life sciences)  . Smokeless tobacco: Never Used  Substance and Sexual Activity  . Alcohol use: No  . Drug use: No  . Sexual  activity: Not Currently    Partners: Male    Birth control/protection: Post-menopausal  Other Topics Concern  . Not on file  Social History Narrative  . Not on file   Social Determinants of Health   Financial Resource Strain:   . Difficulty of Paying Living Expenses:   Food Insecurity:   . Worried About Charity fundraiser in the Last Year:   . Arboriculturist in the Last Year:   Transportation Needs:   . Film/video editor (Medical):   Marland Kitchen Lack of Transportation (Non-Medical):   Physical Activity:   . Days of Exercise per Week:   . Minutes of Exercise per Session:   Stress:   . Feeling of Stress :   Social Connections:   . Frequency of Communication with Friends and Family:   . Frequency of Social Gatherings with Friends and Family:   . Attends Religious Services:   . Active Member of Clubs or Organizations:   . Attends Archivist Meetings:   Marland Kitchen Marital Status:   Intimate Partner Violence:   . Fear of Current or Ex-Partner:   . Emotionally Abused:   Marland Kitchen Physically Abused:   . Sexually  Abused:     Vital Signs: Blood pressure 133/84, pulse 83, resp. rate 16, height 5\' 1"  (1.549 m), weight 147 lb (66.7 kg), SpO2 97 %.  Examination: General Appearance: The patient is well-developed, well-nourished, and in no distress. Skin: Gross inspection of skin unremarkable. Head: normocephalic, no gross deformities. Eyes: no gross deformities noted. ENT: ears appear grossly normal no exudates. Neck: Supple. No thyromegaly. No LAD. Respiratory: clear . Cardiovascular: Normal S1 and S2 without murmur or rub. Extremities: No cyanosis. pulses are equal. Neurologic: Alert and oriented. No involuntary movements.  LABS: Recent Results (from the past 2160 hour(s))  NuSwab BV and Candida, NAA     Status: None   Collection Time: 11/20/19  2:22 PM  Result Value Ref Range   Atopobium vaginae Low - 0 Score   BVAB 2 Low - 0 Score   Megasphaera 1 Low - 0 Score    Comment: Calculate total score by adding the 3 individual bacterial vaginosis (BV) marker scores together.  Total score is interpreted as follows: Total score 0-1: Indicates the absence of BV. Total score   2: Indeterminate for BV. Additional clinical                  data should be evaluated to establish a                  diagnosis. Total score 3-6: Indicates the presence of BV. This test was developed and its performance characteristics determined by [facility].  It has not been cleared or approved by the Food and Drug Administration.  The FDA has determined that such clearance or approval is not necessary.    Candida albicans, NAA Negative Negative   Candida glabrata, NAA Negative Negative  POCT Urine Drug Screen     Status: Abnormal   Collection Time: 02/04/20  2:28 PM  Result Value Ref Range   POC METHAMPHETAMINE UR None Detected None Detected   POC Opiate Ur None Detected None Detected   POC Barbiturate UR None Detected None Detected   POC Amphetamine UR None Detected None Detected   POC Oxycodone UR None Detected  None Detected   POC Cocaine UR None Detected None Detected   POC Ecstasy UR None Detected None Detected   POC TRICYCLICS UR  Positive (A) None Detected   POC PHENCYCLIDINE UR None Detected None Detected   POC MARIJUANA UR None Detected None Detected   POC METHADONE UR None Detected None Detected   POC BENZODIAZEPINES UR None Detected None Detected   URINE TEMPERATURE     POC DRUG SCREEN OXIDANTS URINE     POC SPECIFIC GRAVITY URINE     POC PH URINE     Methylenedioxyamphetamine    POCT Urinalysis Dipstick     Status: None   Collection Time: 02/04/20  2:41 PM  Result Value Ref Range   Color, UA     Clarity, UA     Glucose, UA Negative Negative   Bilirubin, UA negative    Ketones, UA negative    Spec Grav, UA 1.010 1.010 - 1.025   Blood, UA negative    pH, UA 6.0 5.0 - 8.0   Protein, UA Negative Negative   Urobilinogen, UA 0.2 0.2 or 1.0 E.U./dL   Nitrite, UA negative    Leukocytes, UA Negative Negative   Appearance     Odor      Radiology: No results found.  No results found.  No results found.    Assessment and Plan: Patient Active Problem List   Diagnosis Date Noted  . Encounter for general adult medical examination with abnormal findings 11/10/2019  . Gastroesophageal reflux disease without esophagitis 11/10/2019  . Dysuria 11/10/2019  . Acute otitis externa of left ear 07/15/2019  . Irritable bowel syndrome with constipation 07/15/2019  . Chronic midline low back pain without sciatica 04/27/2019  . Allergic rhinitis due to pollen 04/27/2019  . Essential hypertension, benign 04/27/2019  . GAD (generalized anxiety disorder) 09/01/2018  . Chronic migraine without aura without status migrainosus, not intractable 09/01/2018  . Encounter for long-term (current) use of medications 09/01/2018  . Asthma without status asthmaticus 01/30/2018  . Hyperlipidemia, unspecified 01/30/2018  . Hypertension 01/30/2018  . Obesity, unspecified 01/30/2018  . Hypothyroidism  01/30/2018  . Acute respiratory failure with hypoxia ([place]) 12/31/2017  . Fatty infiltration of liver 12/10/2015  . Abdominal pain, LLQ (left lower quadrant) 11/03/2015  . History of adenomatous polyp of colon 11/03/2015  . Hallux valgus, left 09/18/2014  . 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  . Shoulder arthritis 12/19/2013    1. Moderate persistent asthma without status asthmaticus with acute exacerbation Controlled currently.  Pt still does not wish to do PFT.  Continue present management.   2. OSA (obstructive sleep apnea) Non-compliant with cpap for over a year.  Once again discussed importance of cpap therapy.   3. Seasonal allergic rhinitis due to pollen Controlled at this time.  Continue to take allergy medication as prescribed.   4. Gastroesophageal reflux disease without esophagitis Stable, continue meds as discussed.   General Counseling: I have discussed the findings of the evaluation and examination with Vaughan Basta.  I have also discussed any further diagnostic evaluation thatmay be needed or ordered today. Monie verbalizes understanding of the findings of todays visit. We also reviewed her medications today and discussed drug interactions and side effects including but not limited excessive drowsiness and altered mental states. We also discussed that there is always a risk not just to her but also people around her. she has been encouraged to call the office with any questions or concerns that should arise related to todays visit.  No orders of the defined types were placed in this encounter.    Time spent: 25 This patient was seen by Orson Gear  AGNP-C in Collaboration with Dr. Devona Konig as a part of collaborative care agreement.   I have personally obtained a history, examined the patient, evaluated laboratory and imaging results, formulated the assessment and plan and placed orders.    Allyne Gee, MD [facility] Pulmonary and Critical Care Sleep medicine

## 2020-02-12 DIAGNOSIS — J0140 Acute pansinusitis, unspecified: Secondary | ICD-10-CM | POA: Insufficient documentation

## 2020-02-17 ENCOUNTER — Telehealth: Payer: Self-pay

## 2020-02-17 ENCOUNTER — Other Ambulatory Visit: Payer: Self-pay

## 2020-02-17 MED ORDER — FLUOXETINE HCL 40 MG PO CAPS: ORAL_CAPSULE | ORAL | 5 refills | Status: DC

## 2020-02-17 MED ORDER — LEVOTHYROXINE SODIUM 100 MCG PO TABS: ORAL_TABLET | ORAL | 1 refills | Status: DC

## 2020-02-17 NOTE — Telephone Encounter (Signed)
Left message to call [place] at [phone].  Checking on patient scheduling pessary insertion for #7 ring pessary with support in office.

## 2020-02-20 NOTE — Telephone Encounter (Signed)
Left message to call Bonnie West at [phone]. 

## 2020-03-03 ENCOUNTER — Other Ambulatory Visit: Payer: Self-pay | Admitting: Nurse Practitioner

## 2020-03-03 DIAGNOSIS — E559 Vitamin D deficiency, unspecified: Secondary | ICD-10-CM | POA: Diagnosis not present

## 2020-03-03 DIAGNOSIS — E039 Hypothyroidism, unspecified: Secondary | ICD-10-CM | POA: Diagnosis not present

## 2020-03-03 DIAGNOSIS — I10 Essential (primary) hypertension: Secondary | ICD-10-CM | POA: Diagnosis not present

## 2020-03-03 DIAGNOSIS — Z0001 Encounter for general adult medical examination with abnormal findings: Secondary | ICD-10-CM | POA: Diagnosis not present

## 2020-03-04 LAB — LIPID PANEL W/O CHOL/HDL RATIO
Cholesterol, Total: 206 mg/dL — ABNORMAL HIGH (ref 100–199)
HDL: 51 mg/dL (ref >39)
LDL Chol Calc (NIH): 129 mg/dL — ABNORMAL HIGH (ref 0–99)
Triglycerides: 147 mg/dL (ref 0–149)
VLDL Cholesterol Cal: 26 mg/dL (ref 5–40)

## 2020-03-04 LAB — COMPREHENSIVE METABOLIC PANEL
ALT: 13 IU/L (ref 0–32)
AST: 23 IU/L (ref 0–40)
Albumin/Globulin Ratio: 1.8 (ref 1.2–2.2)
Albumin: 4.7 g/dL (ref 3.8–4.8)
Alkaline Phosphatase: 52 IU/L (ref 39–117)
BUN/Creatinine Ratio: 25 (ref 12–28)
BUN: 23 mg/dL (ref 8–27)
Bilirubin Total: 0.4 mg/dL (ref 0.0–1.2)
CO2: 22 mmol/L (ref 20–29)
Calcium: 10.7 mg/dL — ABNORMAL HIGH (ref 8.7–10.3)
Chloride: 102 mmol/L (ref 96–106)
Creatinine, Ser: 0.91 mg/dL (ref 0.57–1.00)
GFR calc Af Amer: 74 mL/min/{1.73_m2} (ref >59)
GFR calc non Af Amer: 64 mL/min/{1.73_m2} (ref >59)
Globulin, Total: 2.6 g/dL (ref 1.5–4.5)
Glucose: 105 mg/dL — ABNORMAL HIGH (ref 65–99)
Potassium: 4.4 mmol/L (ref 3.5–5.2)
Sodium: 139 mmol/L (ref 134–144)
Total Protein: 7.3 g/dL (ref 6.0–8.5)

## 2020-03-04 LAB — CBC
Hematocrit: 41.5 % (ref 34.0–46.6)
Hemoglobin: 14.2 g/dL (ref 11.1–15.9)
MCH: 31.9 pg (ref 26.6–33.0)
MCHC: 34.2 g/dL (ref 31.5–35.7)
MCV: 93 fL (ref 79–97)
Platelets: 324 10*3/uL (ref 150–450)
RBC: 4.45 x10E6/uL (ref 3.77–5.28)
RDW: 12.3 % (ref 11.7–15.4)
WBC: 7.3 10*3/uL (ref 3.4–10.8)

## 2020-03-04 LAB — T4, FREE: Free T4: 1.71 ng/dL (ref 0.82–1.77)

## 2020-03-04 LAB — VITAMIN D 25 HYDROXY (VIT D DEFICIENCY, FRACTURES): Vit D, 25-Hydroxy: 40.6 ng/mL (ref 30.0–100.0)

## 2020-03-04 LAB — TSH: TSH: 0.961 u[IU]/mL (ref 0.450–4.500)

## 2020-03-09 NOTE — Progress Notes (Signed)
Recheck calcium level with pth

## 2020-03-10 NOTE — Telephone Encounter (Signed)
Will close the encouter

## 2020-03-10 NOTE — Telephone Encounter (Signed)
Dr.Jertson, unable to reach patient x 2. No return call. Okay to close encounter?

## 2020-03-14 ENCOUNTER — Other Ambulatory Visit: Payer: Self-pay | Admitting: Nurse Practitioner

## 2020-03-19 DIAGNOSIS — L578 Other skin changes due to chronic exposure to nonionizing radiation: Secondary | ICD-10-CM | POA: Diagnosis not present

## 2020-03-19 DIAGNOSIS — L814 Other melanin hyperpigmentation: Secondary | ICD-10-CM | POA: Diagnosis not present

## 2020-03-19 DIAGNOSIS — D229 Melanocytic nevi, unspecified: Secondary | ICD-10-CM | POA: Diagnosis not present

## 2020-03-19 DIAGNOSIS — L820 Inflamed seborrheic keratosis: Secondary | ICD-10-CM | POA: Diagnosis not present

## 2020-03-19 DIAGNOSIS — L821 Other seborrheic keratosis: Secondary | ICD-10-CM | POA: Diagnosis not present

## 2020-04-15 ENCOUNTER — Telehealth: Payer: Self-pay

## 2020-04-15 ENCOUNTER — Other Ambulatory Visit: Payer: Self-pay

## 2020-04-15 DIAGNOSIS — G8929 Other chronic pain: Secondary | ICD-10-CM

## 2020-04-15 MED ORDER — CYCLOBENZAPRINE HCL 5 MG PO TABS: 5.0000 mg | ORAL_TABLET | Freq: Two times a day (BID) | ORAL | 2 refills | Status: DC | PRN

## 2020-04-15 NOTE — Telephone Encounter (Signed)
Authorization was approved for CYCLOBENZAPRINE HCI 5 MG TABLETS COVERAGE FROM 11/22/2019 TO 11/20/2020 SL

## 2020-05-05 ENCOUNTER — Telehealth: Payer: Self-pay

## 2020-05-05 NOTE — Telephone Encounter (Signed)
Confirmed and screened for 05-07-20 ov. 

## 2020-05-07 ENCOUNTER — Other Ambulatory Visit: Payer: Self-pay

## 2020-05-07 ENCOUNTER — Encounter: Payer: Self-pay | Admitting: Nurse Practitioner

## 2020-05-07 ENCOUNTER — Ambulatory Visit (INDEPENDENT_AMBULATORY_CARE_PROVIDER_SITE_OTHER): Payer: Medicare Other | Admitting: Nurse Practitioner

## 2020-05-07 VITALS — BP 128/69 | HR 87 | Temp 97.6°F | Resp 16 | Ht 61.0 in | Wt 149.0 lb

## 2020-05-07 DIAGNOSIS — I10 Essential (primary) hypertension: Secondary | ICD-10-CM

## 2020-05-07 DIAGNOSIS — F5101 Primary insomnia: Secondary | ICD-10-CM

## 2020-05-07 DIAGNOSIS — G8929 Other chronic pain: Secondary | ICD-10-CM | POA: Diagnosis not present

## 2020-05-07 DIAGNOSIS — R0789 Other chest pain: Secondary | ICD-10-CM

## 2020-05-07 DIAGNOSIS — J4540 Moderate persistent asthma, uncomplicated: Secondary | ICD-10-CM

## 2020-05-07 DIAGNOSIS — M545 Low back pain: Secondary | ICD-10-CM

## 2020-05-07 MED ORDER — HYDROCODONE-ACETAMINOPHEN 5-300 MG PO TABS: 1.0000 | ORAL_TABLET | Freq: Two times a day (BID) | ORAL | 0 refills | Status: DC | PRN

## 2020-05-07 MED ORDER — ZOLPIDEM TARTRATE 5 MG PO TABS: ORAL_TABLET | ORAL | 1 refills | Status: DC

## 2020-05-07 NOTE — Progress Notes (Signed)
[facility] [place], [place] [address]  Internal MEDICINE  Office Visit Note  Patient Name: Bonnie West  [id number]  [id number]  Date of Service: 05/20/2020  Chief Complaint  Patient presents with  . Follow-up  . Hypertension  . Hyperlipidemia  . Gastroesophageal Reflux  . Depression  . Sleep Apnea    Having a hard time sleeping  . Chest Pain    Feels a squeezing pain on the left upper breast; not consistent, comes and goes    The patient is here for routine follow up. She states that she had intermittent chest pain last week. States that pain feels like a squeezing sensation. Happened several times last week, however, she has not had any episodes this week. She denies shortness of breath which is unusual for her.  She states that she is also having trouble with sleep. States that she will lay in bed for hours and not be able to get to sleep. She does take alprazolam as needed for anxiety. States that this does not help her sleep and does not make her feel tired at all. She states that she used to take a low dose of ambien in the past. She states that this helped more than anything. She was able to fall asleep without problems and did not have trouble getting up the next day. She states that it has been several years since she had this prescription.   Her main complaint is chronic lower back pain. She does, occassionally, take hydrocodone/APAP 5/300mg  when needed for severe pain. She gets a prescription for #15 tablets at her in office visits. She does need to have a new prescription for this today.      Current Medication: Outpatient Encounter Medications as of 05/07/2020  Medication Sig  . acetaminophen (TYLENOL) 500 MG tablet Take 1,000 mg by mouth 2 (two) times daily as needed for mild pain or moderate pain.  Marland Kitchen albuterol (PROVENTIL) (2.5 MG/3ML) 0.083% nebulizer solution Take 3 mLs (2.5 mg total) by nebulization every 6 (six) hours as needed for  wheezing or shortness of breath.  Marland Kitchen albuterol (VENTOLIN HFA) 108 (90 Base) MCG/ACT inhaler INHALE 2 PUFFS BY MOUTH EVERY 6 HOURS  . ALPRAZolam (XANAX) 0.25 MG tablet Take 1 tablet (0.25 mg total) by mouth 2 (two) times daily as needed for anxiety.  Marland Kitchen amLODipine (NORVASC) 5 MG tablet Take 1 tablet (5 mg total) by mouth daily.  . bisacodyl (DULCOLAX) 5 MG EC tablet Take 5-10 mg by mouth at bedtime as needed for moderate constipation. Two at night  . cetirizine (ZYRTEC) 10 MG tablet Take 10 mg by mouth daily.  . cholecalciferol (VITAMIN D) 1000 units tablet Take 1,000 Units by mouth at bedtime.  . clindamycin (CLEOCIN T) 1 % external solution Apply 1 application topically 2 (two) times daily as needed. For skin breakdown  . cyclobenzaprine (FLEXERIL) 5 MG tablet Take 1-2 tablets (5-10 mg total) by mouth 2 (two) times daily as needed for muscle spasms.  Marland Kitchen docusate sodium (COLACE) 100 MG capsule Take 100-200 mg by mouth at bedtime as needed for mild constipation or moderate constipation.   Marland Kitchen estradiol (ESTRACE) 0.1 MG/GM vaginal cream INSERT 1 GRAM VAGINALLY TWICE WEEKLY AT BEDTIME  . fenofibrate (TRICOR) 145 MG tablet Take 1 tablet (145 mg total) by mouth daily.  Marland Kitchen FLUoxetine (PROZAC) 40 MG capsule TAKE 1 CAPSULE BY MOUTH EVERY DAY  . fluticasone (FLOVENT HFA) 110 MCG/ACT inhaler TAKE 1 PUFF BY MOUTH TWICE  A DAY  . HYDROcodone-Acetaminophen 5-300 MG TABS Take 1 tablet by mouth 2 (two) times daily as needed. For severe pain  . ipratropium (ATROVENT HFA) 17 MCG/ACT inhaler INHALE 2 PUFFS 4 TIMES DAILY  . ipratropium (ATROVENT) 0.02 % nebulizer solution Use 1 vial nebulized QID prn shortness of breath/wheezing.  . isometheptene-acetaminophen-dichloralphenazone (MIDRIN) 65-100-325 MG capsule Take 1 capsule by mouth 4 (four) times daily as needed for migraine. Maximum 5 capsules in 12 hours for migraine headaches, 8 capsules in 24 hours for tension headaches.  . levothyroxine (SYNTHROID) 100 MCG tablet TAKE  1 TABLET BY MOUTH EVERY DAY IN THE MORNING  . lisinopril (ZESTRIL) 10 MG tablet Take 1 tablet (10 mg total) by mouth daily.  . magnesium oxide (MAG-OX) 400 MG tablet Take 400 mg by mouth at bedtime.   . montelukast (SINGULAIR) 10 MG tablet Take 1 tablet (10 mg total) by mouth daily.  Marland Kitchen omeprazole (PRILOSEC) 40 MG capsule TAKE 1 CAPSULE BY MOUTH EVERY DAY  . polyethylene glycol powder (GLYCOLAX/MIRALAX) 17 GM/SCOOP powder Take 17 g by mouth every evening.  . triamcinolone cream (KENALOG) 0.1 % Apply 1 application topically 2 (two) times daily as needed. For skin breakdown  . vitamin B-12 (CYANOCOBALAMIN) 1000 MCG tablet Take 1,000 mcg by mouth daily.  . [DISCONTINUED] azithromycin (ZITHROMAX) 250 MG tablet z-pack - take as directed for 5 days  . [DISCONTINUED] HYDROcodone-Acetaminophen 5-300 MG TABS Take 1 tablet by mouth 2 (two) times daily as needed. For severe pain  . zolpidem (AMBIEN) 5 MG tablet Take 1/2 to 1 tablets po QHS prn insomnia   No facility-administered encounter medications on file as of 05/07/2020.    Surgical History: Past Surgical History:  Procedure Laterality Date  . BUNIONECTOMY    . CATARACT EXTRACTION W/PHACO Right 10/11/2016   Procedure: CATARACT EXTRACTION PHACO AND INTRAOCULAR LENS PLACEMENT (IOC);  Surgeon: Birder Robson, MD;  Location: [facility] ORS;  Service: Ophthalmology;  Laterality: Right;  Lot# [id number] H [place]: 00:37.7 AP%: 18.1 CDE: 6.80  . CATARACT EXTRACTION W/PHACO Left 11/08/2016   Procedure: CATARACT EXTRACTION PHACO AND INTRAOCULAR LENS PLACEMENT (IOC);  Surgeon: Birder Robson, MD;  Location: [facility] ORS;  Service: Ophthalmology;  Laterality: Left;  PACK LOT: [id number] H US:00:32 AP:44 CDE:6.46  . COLONOSCOPY    . COLONOSCOPY WITH PROPOFOL N/A 11/26/2015   Procedure: COLONOSCOPY WITH PROPOFOL;  Surgeon: Manya Silvas, MD;  Location: [facility];  Service: Endoscopy;  Laterality: N/A;  . EYE SURGERY    . TONSILLECTOMY      Medical History: Past  Medical History:  Diagnosis Date  . Allergic rhinitis   . Anxiety   . Asthma   . Blood transfusion without reported diagnosis   . Depression   . Diverticulosis   . Dyspnea   . Esophagitis   . GERD (gastroesophageal reflux disease)   . Headache   . Heart murmur   . Hyperlipemia   . Hypertension   . Hypothyroidism   . Obesity   . Osteopenia   . Palpitations   . Pneumothorax   . Sleep apnea     Family History: Family History  Problem Relation Age of Onset  . Breast cancer Maternal Grandmother   . Stroke Maternal Grandmother   . Bladder Cancer Father   . Prostate cancer Father   . Heart attack Father   . Aortic aneurysm Father   . Congestive Heart Failure Father   . Colon cancer Paternal Grandmother   . Aneurysm Paternal Grandmother   . Stroke Paternal  Grandmother   . Congestive Heart Failure Paternal Grandmother   . Dementia Mother   . Stroke Mother   . Dementia Maternal Grandfather   . Stroke Maternal Grandfather   . Dementia Paternal Grandfather   . Stroke Paternal Grandfather     Social History   Socioeconomic History  . Marital status: Married    Spouse name: Not on file  . Number of children: Not on file  . Years of education: Not on file  . Highest education level: Not on file  Occupational History  . Not on file  Tobacco Use  . Smoking status: Former Research scientist (life sciences)  . Smokeless tobacco: Never Used  Vaping Use  . Vaping Use: Never used  Substance and Sexual Activity  . Alcohol use: No  . Drug use: No  . Sexual activity: Not Currently    Partners: Male    Birth control/protection: Post-menopausal  Other Topics Concern  . Not on file  Social History Narrative  . Not on file   Social Determinants of Health   Financial Resource Strain:   . Difficulty of Paying Living Expenses:   Food Insecurity:   . Worried About Charity fundraiser in the Last Year:   . Arboriculturist in the Last Year:   Transportation Needs:   . Film/video editor  (Medical):   Marland Kitchen Lack of Transportation (Non-Medical):   Physical Activity:   . Days of Exercise per Week:   . Minutes of Exercise per Session:   Stress:   . Feeling of Stress :   Social Connections:   . Frequency of Communication with Friends and Family:   . Frequency of Social Gatherings with Friends and Family:   . Attends Religious Services:   . Active Member of Clubs or Organizations:   . Attends Archivist Meetings:   Marland Kitchen Marital Status:   Intimate Partner Violence:   . Fear of Current or Ex-Partner:   . Emotionally Abused:   Marland Kitchen Physically Abused:   . Sexually Abused:       Review of Systems  Constitutional: Positive for fatigue. Negative for activity change, chills and unexpected weight change.  HENT: Negative for congestion, postnasal drip, rhinorrhea, sneezing, sore throat and voice change.   Respiratory: Positive for cough. Negative for chest tightness, shortness of breath and wheezing.        Intermittent  Cardiovascular: Positive for chest pain. Negative for palpitations.       Patient states that she had intermittent chest pain lat week. Pain would last for few minutes then resolve on it's own. She denies shortness of breath or palpitations. States that she has not had these issues this week.   Gastrointestinal: Negative for abdominal pain, constipation, diarrhea, nausea and vomiting.  Endocrine: Negative for cold intolerance, heat intolerance, polydipsia and polyuria.  Musculoskeletal: Positive for arthralgias, back pain and joint swelling. Negative for neck pain.       Intermittent lower back pain.   Skin: Negative for rash.  Allergic/Immunologic: Positive for environmental allergies.  Neurological: Negative for dizziness, tremors, numbness and headaches.  Hematological: Negative for adenopathy. Does not bruise/bleed easily.  Psychiatric/Behavioral: Positive for dysphoric mood. Negative for behavioral problems (Depression), sleep disturbance and suicidal  ideas. The patient is nervous/anxious.     Today's Vitals   05/07/20 1449  BP: 128/69  Pulse: 87  Resp: 16  Temp: 97.6 F (36.4 C)  SpO2: 94%  Weight: 149 lb (67.6 kg)  Height: 5\' 1"  (1.549 m)  Body mass index is 28.15 kg/m.  Physical Exam Vitals and nursing note reviewed.  Constitutional:      General: She is not in acute distress.    Appearance: Normal appearance. She is well-developed. She is not diaphoretic.  HENT:     Head: Normocephalic and atraumatic.     Mouth/Throat:     Pharynx: No oropharyngeal exudate.  Eyes:     Pupils: Pupils are equal, round, and reactive to light.  Neck:     Thyroid: No thyromegaly.     Vascular: No JVD.     Trachea: No tracheal deviation.  Cardiovascular:     Rate and Rhythm: Normal rate and regular rhythm.     Heart sounds: Normal heart sounds. No murmur heard.  No friction rub. No gallop.      Comments: ECG in the office has low voltage in limb leads, but is otherwise without abnormalities Pulmonary:     Effort: Pulmonary effort is normal. No respiratory distress.     Breath sounds: Normal breath sounds. No wheezing or rales.  Chest:     Chest wall: No tenderness.  Abdominal:     Palpations: Abdomen is soft.  Musculoskeletal:        General: Normal range of motion.     Cervical back: Normal range of motion and neck supple.  Lymphadenopathy:     Cervical: No cervical adenopathy.  Skin:    General: Skin is warm and dry.  Neurological:     General: No focal deficit present.     Mental Status: She is alert and oriented to person, place, and time.     Cranial Nerves: No cranial nerve deficit.  Psychiatric:        Attention and Perception: Attention and perception normal.        Mood and Affect: Affect normal. Mood is anxious. Mood is not depressed.        Speech: Speech normal.        Behavior: Behavior normal. Behavior is cooperative.        Thought Content: Thought content normal.        Cognition and Memory: Cognition and  memory normal.        Judgment: Judgment normal.    Assessment/Plan:  1. Tightness in chest ECG today has low voltage in limb leads, but without other abnormalities or evidence of ischemia. Will get echocardiogram for further evaluation.  - EKG 12-Lead - ECHOCARDIOGRAM COMPLETE; Future  2. Essential hypertension, benign blood pressure stable. Continue bp medication as prescribed  3. Moderate persistent asthma without status asthmaticus without complication Well managed. Continue inhalers and respiratory medications as prescribed.   4. Hypercalcemia Calcium level 10.7 at her last check. Will recheck BMP along with intact PTH and ionized calcium for further evaluation.   5. Primary insomnia Trial of zolpidem 5mg  tablets. Advised her to take 1/2 to 1 tablet at bedtime as needed. Advised her not to take every night and to only take if she was ready to go to sleep. She voiced understanding and agreement with instructions - zolpidem (AMBIEN) 5 MG tablet; Take 1/2 to 1 tablets po QHS prn insomnia  Dispense: 15 tablet; Refill: 1  6. Chronic midline low back pain without sciatica May take hydrocodone/APAP 5/300mg  as needed and as prescribed. A prescription for #15 tablets was sent to her pharmacy today  - HYDROcodone-Acetaminophen 5-300 MG TABS; Take 1 tablet by mouth 2 (two) times daily as needed. For severe pain  Dispense: 15 tablet; Refill:  0  General Counseling: rechelle niebla understanding of the findings of todays visit and agrees with plan of treatment. I have discussed any further diagnostic evaluation that may be needed or ordered today. We also reviewed her medications today. she has been encouraged to call the office with any questions or concerns that should arise related to todays visit.  This patient was seen by Leretha Pol FNP Collaboration with Dr Lavera Guise as a part of collaborative care agreement  Orders Placed This Encounter  Procedures  . EKG 12-Lead  .  ECHOCARDIOGRAM COMPLETE    Meds ordered this encounter  Medications  . zolpidem (AMBIEN) 5 MG tablet    Sig: Take 1/2 to 1 tablets po QHS prn insomnia    Dispense:  15 tablet    Refill:  1    Order Specific Question:   Supervising Provider    Answer:   Lavera Guise [place]  . HYDROcodone-Acetaminophen 5-300 MG TABS    Sig: Take 1 tablet by mouth 2 (two) times daily as needed. For severe pain    Dispense:  15 tablet    Refill:  0    Order Specific Question:   Supervising Provider    Answer:   Lavera Guise [[id number]]    Total time spent: 42 Minutes   Time spent includes review of chart, medications, test results, and follow up plan with the patient.      Dr Lavera Guise Internal medicine

## 2020-05-20 DIAGNOSIS — F5101 Primary insomnia: Secondary | ICD-10-CM | POA: Insufficient documentation

## 2020-05-20 DIAGNOSIS — R0789 Other chest pain: Secondary | ICD-10-CM | POA: Insufficient documentation

## 2020-06-05 ENCOUNTER — Other Ambulatory Visit: Payer: Self-pay

## 2020-06-05 DIAGNOSIS — I10 Essential (primary) hypertension: Secondary | ICD-10-CM

## 2020-06-05 MED ORDER — LISINOPRIL 10 MG PO TABS: 10.0000 mg | ORAL_TABLET | Freq: Every day | ORAL | 2 refills | Status: DC

## 2020-06-05 MED ORDER — LEVOTHYROXINE SODIUM 100 MCG PO TABS: ORAL_TABLET | ORAL | 1 refills | Status: DC

## 2020-06-05 MED ORDER — AMLODIPINE BESYLATE 5 MG PO TABS: 5.0000 mg | ORAL_TABLET | Freq: Every day | ORAL | 1 refills | Status: DC

## 2020-06-12 ENCOUNTER — Ambulatory Visit: Payer: Medicare Other

## 2020-06-12 ENCOUNTER — Other Ambulatory Visit: Payer: Self-pay

## 2020-06-12 ENCOUNTER — Telehealth: Payer: Self-pay

## 2020-06-12 DIAGNOSIS — R0789 Other chest pain: Secondary | ICD-10-CM | POA: Diagnosis not present

## 2020-06-12 NOTE — Telephone Encounter (Signed)
Confirmed appointment on 06/16/2020 and screened for covid. [facility]

## 2020-06-16 ENCOUNTER — Ambulatory Visit (INDEPENDENT_AMBULATORY_CARE_PROVIDER_SITE_OTHER): Payer: Medicare Other | Admitting: Internal Medicine

## 2020-06-16 ENCOUNTER — Encounter: Payer: Self-pay | Admitting: Internal Medicine

## 2020-06-16 ENCOUNTER — Other Ambulatory Visit: Payer: Self-pay

## 2020-06-16 ENCOUNTER — Other Ambulatory Visit: Payer: Self-pay | Admitting: Nurse Practitioner

## 2020-06-16 VITALS — BP 130/72 | HR 87 | Temp 97.6°F | Resp 16 | Ht 61.0 in | Wt 149.0 lb

## 2020-06-16 DIAGNOSIS — J4540 Moderate persistent asthma, uncomplicated: Secondary | ICD-10-CM

## 2020-06-16 DIAGNOSIS — K219 Gastro-esophageal reflux disease without esophagitis: Secondary | ICD-10-CM

## 2020-06-16 DIAGNOSIS — Z9989 Dependence on other enabling machines and devices: Secondary | ICD-10-CM | POA: Diagnosis not present

## 2020-06-16 DIAGNOSIS — G4733 Obstructive sleep apnea (adult) (pediatric): Secondary | ICD-10-CM

## 2020-06-16 DIAGNOSIS — J301 Allergic rhinitis due to pollen: Secondary | ICD-10-CM | POA: Diagnosis not present

## 2020-06-16 DIAGNOSIS — I10 Essential (primary) hypertension: Secondary | ICD-10-CM

## 2020-06-16 NOTE — Progress Notes (Signed)
[facility] [place], [place] [address]  Pulmonary Sleep Medicine   Office Visit Note  Patient Name: Bonnie West DOB: 07/19/1951 MRN [id number]  Date of Service: 06/16/2020  Complaints/HPI: Pt is here for pulmonary follow up. She is well appearing today. Her history includes OSA, HTN, asthma, and allergies.  she has not worn her cpap in over 16 months.  She is thinking about starting it again.  She uses her inhalers without difficulty.  She reports very minimal SOB.       ROS  General: (-) fever, (-) chills, (-) night sweats, (-) weakness Skin: (-) rashes, (-) itching,. Eyes: (-) visual changes, (-) redness, (-) itching. Nose and Sinuses: (-) nasal stuffiness or itchiness, (-) postnasal drip, (-) nosebleeds, (-) sinus trouble. Mouth and Throat: (-) sore throat, (-) hoarseness. Neck: (-) swollen glands, (-) enlarged thyroid, (-) neck pain. Respiratory: - cough, (-) bloody sputum, - shortness of breath, - wheezing. Cardiovascular: - ankle swelling, (-) chest pain. Lymphatic: (-) lymph node enlargement. Neurologic: (-) numbness, (-) tingling. Psychiatric: (-) anxiety, (-) depression   Current Medication: Outpatient Encounter Medications as of 06/16/2020  Medication Sig  . acetaminophen (TYLENOL) 500 MG tablet Take 1,000 mg by mouth 2 (two) times daily as needed for mild pain or moderate pain.  Marland Kitchen albuterol (PROVENTIL) (2.5 MG/3ML) 0.083% nebulizer solution Take 3 mLs (2.5 mg total) by nebulization every 6 (six) hours as needed for wheezing or shortness of breath.  Marland Kitchen albuterol (VENTOLIN HFA) 108 (90 Base) MCG/ACT inhaler INHALE 2 PUFFS BY MOUTH EVERY 6 HOURS  . ALPRAZolam (XANAX) 0.25 MG tablet Take 1 tablet (0.25 mg total) by mouth 2 (two) times daily as needed for anxiety.  Marland Kitchen amLODipine (NORVASC) 5 MG tablet Take 1 tablet (5 mg total) by mouth daily.  . bisacodyl (DULCOLAX) 5 MG EC tablet Take 5-10 mg by mouth at bedtime as needed for moderate  constipation. Two at night  . cetirizine (ZYRTEC) 10 MG tablet Take 10 mg by mouth daily.  . cholecalciferol (VITAMIN D) 1000 units tablet Take 1,000 Units by mouth at bedtime.  . clindamycin (CLEOCIN T) 1 % external solution Apply 1 application topically 2 (two) times daily as needed. For skin breakdown  . cyclobenzaprine (FLEXERIL) 5 MG tablet Take 1-2 tablets (5-10 mg total) by mouth 2 (two) times daily as needed for muscle spasms.  Marland Kitchen docusate sodium (COLACE) 100 MG capsule Take 100-200 mg by mouth at bedtime as needed for mild constipation or moderate constipation.   Marland Kitchen estradiol (ESTRACE) 0.1 MG/GM vaginal cream INSERT 1 GRAM VAGINALLY TWICE WEEKLY AT BEDTIME  . fenofibrate (TRICOR) 145 MG tablet Take 1 tablet (145 mg total) by mouth daily.  Marland Kitchen FLUoxetine (PROZAC) 40 MG capsule TAKE 1 CAPSULE BY MOUTH EVERY DAY  . fluticasone (FLOVENT HFA) 110 MCG/ACT inhaler TAKE 1 PUFF BY MOUTH TWICE A DAY  . HYDROcodone-Acetaminophen 5-300 MG TABS Take 1 tablet by mouth 2 (two) times daily as needed. For severe pain  . ipratropium (ATROVENT HFA) 17 MCG/ACT inhaler INHALE 2 PUFFS 4 TIMES DAILY  . ipratropium (ATROVENT) 0.02 % nebulizer solution Use 1 vial nebulized QID prn shortness of breath/wheezing.  . isometheptene-acetaminophen-dichloralphenazone (MIDRIN) 65-100-325 MG capsule Take 1 capsule by mouth 4 (four) times daily as needed for migraine. Maximum 5 capsules in 12 hours for migraine headaches, 8 capsules in 24 hours for tension headaches.  . levothyroxine (SYNTHROID) 100 MCG tablet TAKE 1 TABLET BY MOUTH EVERY DAY IN THE MORNING  .  lisinopril (ZESTRIL) 10 MG tablet Take 1 tablet (10 mg total) by mouth daily.  . magnesium oxide (MAG-OX) 400 MG tablet Take 400 mg by mouth at bedtime.   . montelukast (SINGULAIR) 10 MG tablet Take 1 tablet (10 mg total) by mouth daily.  Marland Kitchen omeprazole (PRILOSEC) 40 MG capsule TAKE 1 CAPSULE BY MOUTH EVERY DAY  . polyethylene glycol powder (GLYCOLAX/MIRALAX) 17 GM/SCOOP  powder Take 17 g by mouth every evening.  . triamcinolone cream (KENALOG) 0.1 % Apply 1 application topically 2 (two) times daily as needed. For skin breakdown  . vitamin B-12 (CYANOCOBALAMIN) 1000 MCG tablet Take 1,000 mcg by mouth daily.  Marland Kitchen zolpidem (AMBIEN) 5 MG tablet Take 1/2 to 1 tablets po QHS prn insomnia   No facility-administered encounter medications on file as of 06/16/2020.    Surgical History: Past Surgical History:  Procedure Laterality Date  . BUNIONECTOMY    . CATARACT EXTRACTION W/PHACO Right 10/11/2016   Procedure: CATARACT EXTRACTION PHACO AND INTRAOCULAR LENS PLACEMENT (IOC);  Surgeon: Birder Robson, MD;  Location: [facility] ORS;  Service: Ophthalmology;  Laterality: Right;  Lot# [id number] H [place]: 00:37.7 AP%: 18.1 CDE: 6.80  . CATARACT EXTRACTION W/PHACO Left 11/08/2016   Procedure: CATARACT EXTRACTION PHACO AND INTRAOCULAR LENS PLACEMENT (IOC);  Surgeon: Birder Robson, MD;  Location: [facility] ORS;  Service: Ophthalmology;  Laterality: Left;  PACK LOT: [id number] H US:00:32 AP:44 CDE:6.46  . COLONOSCOPY    . COLONOSCOPY WITH PROPOFOL N/A 11/26/2015   Procedure: COLONOSCOPY WITH PROPOFOL;  Surgeon: Manya Silvas, MD;  Location: [facility] ENDOSCOPY;  Service: Endoscopy;  Laterality: N/A;  . EYE SURGERY    . TONSILLECTOMY      Medical History: Past Medical History:  Diagnosis Date  . Allergic rhinitis   . Anxiety   . Asthma   . Blood transfusion without reported diagnosis   . Depression   . Diverticulosis   . Dyspnea   . Esophagitis   . GERD (gastroesophageal reflux disease)   . Headache   . Heart murmur   . Hyperlipemia   . Hypertension   . Hypothyroidism   . Obesity   . Osteopenia   . Palpitations   . Pneumothorax   . Sleep apnea     Family History: Family History  Problem Relation Age of Onset  . Breast cancer Maternal Grandmother   . Stroke Maternal Grandmother   . Bladder Cancer Father   . Prostate cancer Father   . Heart attack Father   . Aortic  aneurysm Father   . Congestive Heart Failure Father   . Colon cancer Paternal Grandmother   . Aneurysm Paternal Grandmother   . Stroke Paternal Grandmother   . Congestive Heart Failure Paternal Grandmother   . Dementia Mother   . Stroke Mother   . Dementia Maternal Grandfather   . Stroke Maternal Grandfather   . Dementia Paternal Grandfather   . Stroke Paternal Grandfather     Social History: Social History   Socioeconomic History  . Marital status: Married    Spouse name: Not on file  . Number of children: Not on file  . Years of education: Not on file  . Highest education level: Not on file  Occupational History  . Not on file  Tobacco Use  . Smoking status: Former Research scientist (life sciences)  . Smokeless tobacco: Never Used  Vaping Use  . Vaping Use: Never used  Substance and Sexual Activity  . Alcohol use: No  . Drug use: No  . Sexual activity: Not Currently  Partners: Male    Birth control/protection: Post-menopausal  Other Topics Concern  . Not on file  Social History Narrative  . Not on file   Social Determinants of Health   Financial Resource Strain:   . Difficulty of Paying Living Expenses:   Food Insecurity:   . Worried About Charity fundraiser in the Last Year:   . Arboriculturist in the Last Year:   Transportation Needs:   . Film/video editor (Medical):   Marland Kitchen Lack of Transportation (Non-Medical):   Physical Activity:   . Days of Exercise per Week:   . Minutes of Exercise per Session:   Stress:   . Feeling of Stress :   Social Connections:   . Frequency of Communication with Friends and Family:   . Frequency of Social Gatherings with Friends and Family:   . Attends Religious Services:   . Active Member of Clubs or Organizations:   . Attends Archivist Meetings:   Marland Kitchen Marital Status:   Intimate Partner Violence:   . Fear of Current or Ex-Partner:   . Emotionally Abused:   Marland Kitchen Physically Abused:   . Sexually Abused:     Vital Signs: Blood  pressure (!) 130/72, pulse 87, temperature 97.6 F (36.4 C), resp. rate 16, height 5\' 1"  (1.549 m), weight 149 lb (67.6 kg), SpO2 96 %.  Examination: General Appearance: The patient is well-developed, well-nourished, and in no distress. Skin: Gross inspection of skin unremarkable. Head: normocephalic, no gross deformities. Eyes: no gross deformities noted. ENT: ears appear grossly normal no exudates. Neck: Supple. No thyromegaly. No LAD. Respiratory: clear bilaterally. Cardiovascular: Normal S1 and S2 without murmur or rub. Extremities: No cyanosis. pulses are equal. Neurologic: Alert and oriented. No involuntary movements.  LABS: No results found for this or any previous visit (from the past 2160 hour(s)).  Radiology: No results found.  No results found.  No results found.    Assessment and Plan: Patient Active Problem List   Diagnosis Date Noted  . Tightness in chest 05/20/2020  . Hypercalcemia 05/20/2020  . Primary insomnia 05/20/2020  . Acute non-recurrent pansinusitis 02/12/2020  . Encounter for general adult medical examination with abnormal findings 11/10/2019  . Gastroesophageal reflux disease without esophagitis 11/10/2019  . Dysuria 11/10/2019  . Acute otitis externa of left ear 07/15/2019  . Irritable bowel syndrome with constipation 07/15/2019  . Chronic midline low back pain without sciatica 04/27/2019  . Allergic rhinitis due to pollen 04/27/2019  . Essential hypertension, benign 04/27/2019  . GAD (generalized anxiety disorder) 09/01/2018  . Chronic migraine without aura without status migrainosus, not intractable 09/01/2018  . Encounter for long-term (current) use of medications 09/01/2018  . Asthma without status asthmaticus 01/30/2018  . Hyperlipidemia, unspecified 01/30/2018  . Hypertension 01/30/2018  . Obesity, unspecified 01/30/2018  . Hypothyroidism 01/30/2018  . Acute respiratory failure with hypoxia ([place]) 12/31/2017  . Fatty infiltration of  liver 12/10/2015  . Abdominal pain, LLQ (left lower quadrant) 11/03/2015  . History of adenomatous polyp of colon 11/03/2015  . Hallux valgus, left 09/18/2014  . 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  . Shoulder arthritis 12/19/2013    1. Moderate persistent asthma without status asthmaticus without complication Continue to use albuterol, atrovent and flovent as prescribed.   2. Seasonal allergic rhinitis due to pollen Stable, continue current mgmt.   3. Gastroesophageal reflux disease without esophagitis No recent issues, continue current mgmt.   4. OSA on CPAP Once again discussed importance of cpap  therapy, and encouraged compliance.   5. Essential hypertension, benign Continue medications as directed.   General Counseling: I have discussed the findings of the evaluation and examination with Vaughan Basta.  I have also discussed any further diagnostic evaluation thatmay be needed or ordered today. Oluwakemi verbalizes understanding of the findings of todays visit. We also reviewed her medications today and discussed drug interactions and side effects including but not limited excessive drowsiness and altered mental states. We also discussed that there is always a risk not just to her but also people around her. she has been encouraged to call the office with any questions or concerns that should arise related to todays visit.  No orders of the defined types were placed in this encounter.    Time spent: 30 This patient was seen by Orson Gear AGNP-C in Collaboration with Dr. Devona Konig as a part of collaborative care agreement.   I have personally obtained a history, examined the patient, evaluated laboratory and imaging results, formulated the assessment and plan and placed orders.    Allyne Gee, MD [facility] Pulmonary and Critical Care Sleep medicine

## 2020-06-17 ENCOUNTER — Telehealth: Payer: Self-pay

## 2020-06-17 LAB — CALCIUM: Calcium: 10.5 mg/dL — ABNORMAL HIGH (ref 8.7–10.3)

## 2020-06-17 LAB — PARATHYROID HORMONE, INTACT (NO CA): PTH: 24 pg/mL (ref 15–65)

## 2020-06-17 LAB — CALCIUM, IONIZED: Calcium, Ion: 5.6 mg/dL (ref 4.5–5.6)

## 2020-06-17 NOTE — Telephone Encounter (Signed)
Lmom to confirm and screen for 06-19-20 ov.

## 2020-06-19 ENCOUNTER — Encounter: Payer: Self-pay | Admitting: Nurse Practitioner

## 2020-06-19 ENCOUNTER — Ambulatory Visit (INDEPENDENT_AMBULATORY_CARE_PROVIDER_SITE_OTHER): Payer: Medicare Other | Admitting: Nurse Practitioner

## 2020-06-19 ENCOUNTER — Other Ambulatory Visit: Payer: Self-pay

## 2020-06-19 VITALS — BP 132/70 | HR 82 | Temp 97.5°F | Resp 16 | Ht 61.0 in | Wt 149.6 lb

## 2020-06-19 DIAGNOSIS — Z9989 Dependence on other enabling machines and devices: Secondary | ICD-10-CM | POA: Diagnosis not present

## 2020-06-19 DIAGNOSIS — G4733 Obstructive sleep apnea (adult) (pediatric): Secondary | ICD-10-CM

## 2020-06-19 DIAGNOSIS — R0789 Other chest pain: Secondary | ICD-10-CM

## 2020-06-19 DIAGNOSIS — I5189 Other ill-defined heart diseases: Secondary | ICD-10-CM | POA: Diagnosis not present

## 2020-06-19 NOTE — Progress Notes (Signed)
[facility] [place], [place] [address]  Internal MEDICINE  Office Visit Note  Patient Name: Bonnie West  [id number]  [id number]  Date of Service: 07/15/2020  Chief Complaint  Patient presents with  . Follow-up    Review echo and labs; is concerned about low BP  . Depression  . Gastroesophageal Reflux  . Hypertension  . Hyperlipidemia  . Quality Metric Gaps    TDAP    The patient is here for follow up. She had echocardiogram. She has normal LVEF with diastolic dysfunction with trace mitral and tricuspid regurgitation. She does have sleep apnea. She is supposed to use CPAP at night. She states that she does not use this as she does not feel like it keeps her airway open at night. She states that she has not used this at all in the past year.  She did have retest of her calcium level along with ionized calcium and parathyroid levels. Her calcuim is 10.5, down from 10.7. her ionized calcium and parathyroid hormone levels are normal.       Current Medication: Outpatient Encounter Medications as of 06/19/2020  Medication Sig  . acetaminophen (TYLENOL) 500 MG tablet Take 1,000 mg by mouth 2 (two) times daily as needed for mild pain or moderate pain.  Marland Kitchen albuterol (PROVENTIL) (2.5 MG/3ML) 0.083% nebulizer solution Take 3 mLs (2.5 mg total) by nebulization every 6 (six) hours as needed for wheezing or shortness of breath.  Marland Kitchen albuterol (VENTOLIN HFA) 108 (90 Base) MCG/ACT inhaler INHALE 2 PUFFS BY MOUTH EVERY 6 HOURS  . bisacodyl (DULCOLAX) 5 MG EC tablet Take 5-10 mg by mouth at bedtime as needed for moderate constipation. Two at night  . cetirizine (ZYRTEC) 10 MG tablet Take 10 mg by mouth daily.  . cholecalciferol (VITAMIN D) 1000 units tablet Take 1,000 Units by mouth at bedtime.  . clindamycin (CLEOCIN T) 1 % external solution Apply 1 application topically 2 (two) times daily as needed. For skin breakdown  . cyclobenzaprine (FLEXERIL) 5 MG tablet Take 1-2  tablets (5-10 mg total) by mouth 2 (two) times daily as needed for muscle spasms.  Marland Kitchen docusate sodium (COLACE) 100 MG capsule Take 100-200 mg by mouth at bedtime as needed for mild constipation or moderate constipation.   Marland Kitchen estradiol (ESTRACE) 0.1 MG/GM vaginal cream INSERT 1 GRAM VAGINALLY TWICE WEEKLY AT BEDTIME  . fenofibrate (TRICOR) 145 MG tablet Take 1 tablet (145 mg total) by mouth daily.  . fluticasone (FLOVENT HFA) 110 MCG/ACT inhaler TAKE 1 PUFF BY MOUTH TWICE A DAY  . HYDROcodone-Acetaminophen 5-300 MG TABS Take 1 tablet by mouth 2 (two) times daily as needed. For severe pain  . ipratropium (ATROVENT HFA) 17 MCG/ACT inhaler INHALE 2 PUFFS 4 TIMES DAILY  . ipratropium (ATROVENT) 0.02 % nebulizer solution Use 1 vial nebulized QID prn shortness of breath/wheezing.  . isometheptene-acetaminophen-dichloralphenazone (MIDRIN) 65-100-325 MG capsule Take 1 capsule by mouth 4 (four) times daily as needed for migraine. Maximum 5 capsules in 12 hours for migraine headaches, 8 capsules in 24 hours for tension headaches.  . levothyroxine (SYNTHROID) 100 MCG tablet TAKE 1 TABLET BY MOUTH EVERY DAY IN THE MORNING  . lisinopril (ZESTRIL) 10 MG tablet Take 1 tablet (10 mg total) by mouth daily.  . magnesium oxide (MAG-OX) 400 MG tablet Take 400 mg by mouth at bedtime.   . montelukast (SINGULAIR) 10 MG tablet Take 1 tablet (10 mg total) by mouth daily.  Marland Kitchen omeprazole (PRILOSEC) 40 MG capsule  TAKE 1 CAPSULE BY MOUTH EVERY DAY  . polyethylene glycol powder (GLYCOLAX/MIRALAX) 17 GM/SCOOP powder Take 17 g by mouth every evening.  . triamcinolone cream (KENALOG) 0.1 % Apply 1 application topically 2 (two) times daily as needed. For skin breakdown  . vitamin B-12 (CYANOCOBALAMIN) 1000 MCG tablet Take 1,000 mcg by mouth daily.  Marland Kitchen zolpidem (AMBIEN) 5 MG tablet Take 1/2 to 1 tablets po QHS prn insomnia  . [DISCONTINUED] ALPRAZolam (XANAX) 0.25 MG tablet Take 1 tablet (0.25 mg total) by mouth 2 (two) times daily as  needed for anxiety.  . [DISCONTINUED] amLODipine (NORVASC) 5 MG tablet Take 1 tablet (5 mg total) by mouth daily.  . [DISCONTINUED] FLUoxetine (PROZAC) 40 MG capsule TAKE 1 CAPSULE BY MOUTH EVERY DAY   No facility-administered encounter medications on file as of 06/19/2020.    Surgical History: Past Surgical History:  Procedure Laterality Date  . BUNIONECTOMY    . CATARACT EXTRACTION W/PHACO Right 10/11/2016   Procedure: CATARACT EXTRACTION PHACO AND INTRAOCULAR LENS PLACEMENT (IOC);  Surgeon: Birder Robson, MD;  Location: [facility] ORS;  Service: Ophthalmology;  Laterality: Right;  Lot# [id number] H [place]: 00:37.7 AP%: 18.1 CDE: 6.80  . CATARACT EXTRACTION W/PHACO Left 11/08/2016   Procedure: CATARACT EXTRACTION PHACO AND INTRAOCULAR LENS PLACEMENT (IOC);  Surgeon: Birder Robson, MD;  Location: [facility] ORS;  Service: Ophthalmology;  Laterality: Left;  PACK LOT: [id number] H US:00:32 AP:44 CDE:6.46  . COLONOSCOPY    . COLONOSCOPY WITH PROPOFOL N/A 11/26/2015   Procedure: COLONOSCOPY WITH PROPOFOL;  Surgeon: Manya Silvas, MD;  Location: [facility] ENDOSCOPY;  Service: Endoscopy;  Laterality: N/A;  . EYE SURGERY    . TONSILLECTOMY      Medical History: Past Medical History:  Diagnosis Date  . Allergic rhinitis   . Anxiety   . Asthma   . Blood transfusion without reported diagnosis   . Depression   . Diverticulosis   . Dyspnea   . Esophagitis   . GERD (gastroesophageal reflux disease)   . Headache   . Heart murmur   . Hyperlipemia   . Hypertension   . Hypothyroidism   . Obesity   . Osteopenia   . Palpitations   . Pneumothorax   . Sleep apnea     Family History: Family History  Problem Relation Age of Onset  . Breast cancer Maternal Grandmother   . Stroke Maternal Grandmother   . Bladder Cancer Father   . Prostate cancer Father   . Heart attack Father   . Aortic aneurysm Father   . Congestive Heart Failure Father   . Colon cancer Paternal Grandmother   . Aneurysm Paternal  Grandmother   . Stroke Paternal Grandmother   . Congestive Heart Failure Paternal Grandmother   . Dementia Mother   . Stroke Mother   . Dementia Maternal Grandfather   . Stroke Maternal Grandfather   . Dementia Paternal Grandfather   . Stroke Paternal Grandfather     Social History   Socioeconomic History  . Marital status: Married    Spouse name: Not on file  . Number of children: Not on file  . Years of education: Not on file  . Highest education level: Not on file  Occupational History  . Not on file  Tobacco Use  . Smoking status: Former Research scientist (life sciences)  . Smokeless tobacco: Never Used  Vaping Use  . Vaping Use: Never used  Substance and Sexual Activity  . Alcohol use: No  . Drug use: No  . Sexual activity: Not Currently  Partners: Male    Birth control/protection: Post-menopausal  Other Topics Concern  . Not on file  Social History Narrative  . Not on file   Social Determinants of Health   Financial Resource Strain:   . Difficulty of Paying Living Expenses: Not on file  Food Insecurity:   . Worried About Charity fundraiser in the Last Year: Not on file  . Ran Out of Food in the Last Year: Not on file  Transportation Needs:   . Lack of Transportation (Medical): Not on file  . Lack of Transportation (Non-Medical): Not on file  Physical Activity:   . Days of Exercise per Week: Not on file  . Minutes of Exercise per Session: Not on file  Stress:   . Feeling of Stress : Not on file  Social Connections:   . Frequency of Communication with Friends and Family: Not on file  . Frequency of Social Gatherings with Friends and Family: Not on file  . Attends Religious Services: Not on file  . Active Member of Clubs or Organizations: Not on file  . Attends Archivist Meetings: Not on file  . Marital Status: Not on file  Intimate Partner Violence:   . Fear of Current or Ex-Partner: Not on file  . Emotionally Abused: Not on file  . Physically Abused: Not on file   . Sexually Abused: Not on file      Review of Systems  Constitutional: Positive for fatigue. Negative for activity change, chills and unexpected weight change.  HENT: Negative for congestion, postnasal drip, rhinorrhea, sneezing, sore throat and voice change.   Respiratory: Positive for cough. Negative for chest tightness, shortness of breath and wheezing.        Intermittent  Cardiovascular: Negative for chest pain and palpitations.  Gastrointestinal: Negative for abdominal pain, constipation, diarrhea, nausea and vomiting.  Endocrine: Negative for cold intolerance, heat intolerance, polydipsia and polyuria.  Musculoskeletal: Positive for arthralgias, back pain and joint swelling. Negative for neck pain.       Intermittent lower back pain.   Skin: Negative for rash.  Allergic/Immunologic: Positive for environmental allergies.  Neurological: Negative for dizziness, tremors, numbness and headaches.  Hematological: Negative for adenopathy. Does not bruise/bleed easily.  Psychiatric/Behavioral: Positive for dysphoric mood. Negative for behavioral problems (Depression), sleep disturbance and suicidal ideas. The patient is nervous/anxious.     Today's Vitals   06/19/20 1423  BP: (!) 132/70  Pulse: 82  Resp: 16  Temp: (!) 97.5 F (36.4 C)  SpO2: 97%  Weight: 149 lb 9.6 oz (67.9 kg)   Body mass index is 28.27 kg/m.  Physical Exam Vitals and nursing note reviewed.  Constitutional:      General: She is not in acute distress.    Appearance: Normal appearance. She is well-developed. She is not diaphoretic.  HENT:     Head: Normocephalic and atraumatic.     Mouth/Throat:     Pharynx: No oropharyngeal exudate.  Eyes:     Pupils: Pupils are equal, round, and reactive to light.  Neck:     Thyroid: No thyromegaly.     Vascular: No JVD.     Trachea: No tracheal deviation.  Cardiovascular:     Rate and Rhythm: Normal rate and regular rhythm.     Heart sounds: Normal heart sounds.  No murmur heard.  No friction rub. No gallop.      Comments: ECG in the office has low voltage in limb leads, but is otherwise without abnormalities  Pulmonary:     Effort: Pulmonary effort is normal. No respiratory distress.     Breath sounds: Normal breath sounds. No wheezing or rales.  Chest:     Chest wall: No tenderness.  Abdominal:     Palpations: Abdomen is soft.  Musculoskeletal:        General: Normal range of motion.     Cervical back: Normal range of motion and neck supple.  Lymphadenopathy:     Cervical: No cervical adenopathy.  Skin:    General: Skin is warm and dry.  Neurological:     General: No focal deficit present.     Mental Status: She is alert and oriented to person, place, and time.     Cranial Nerves: No cranial nerve deficit.  Psychiatric:        Attention and Perception: Attention and perception normal.        Mood and Affect: Affect normal. Mood is anxious. Mood is not depressed.        Speech: Speech normal.        Behavior: Behavior normal. Behavior is cooperative.        Thought Content: Thought content normal.        Cognition and Memory: Cognition and memory normal.        Judgment: Judgment normal.   Assessment/Plan: 1. Tightness in chest Reviewed results of echocardiogram with the patient. She has normal LVEF with diastolic dysfunction with trace mitral and tricuspid regurgitation. Will continue to monitor.   2. Diastolic dysfunction Reviewed results of echocardiogram with the patient. She has normal LVEF with diastolic dysfunction with trace mitral and tricuspid regurgitation. Will continue to monitor.   3. OSA on CPAP She does have sleep apnea. She is supposed to use CPAP at night. She states that she does not use this as she does not feel like it keeps her airway open at night. She states that she has not used this at all in the past year.   advised she discuss this with pulmonologist who manages her CPAP for further evaluation.   4.  Hypercalcemia Reviewed most recent labs with patient. Her calcuim is 10.5, down from 10.7. her ionized calcium and parathyroid hormone levels are normal.    General Counseling: Sandy Salaam understanding of the findings of todays visit and agrees with plan of treatment. I have discussed any further diagnostic evaluation that may be needed or ordered today. We also reviewed her medications today. she has been encouraged to call the office with any questions or concerns that should arise related to todays visit.  This patient was seen by Leretha Pol FNP Collaboration with Dr Lavera Guise as a part of collaborative care agreement   Total time spent: 30 Minutes    Time spent includes review of chart, medications, test results, and follow up plan with the patient.      Dr Lavera Guise Internal medicine

## 2020-06-26 ENCOUNTER — Other Ambulatory Visit: Payer: Self-pay

## 2020-06-26 MED ORDER — FLUOXETINE HCL 40 MG PO CAPS: ORAL_CAPSULE | ORAL | 1 refills | Status: DC

## 2020-07-09 ENCOUNTER — Other Ambulatory Visit: Payer: Self-pay

## 2020-07-09 DIAGNOSIS — F411 Generalized anxiety disorder: Secondary | ICD-10-CM

## 2020-07-10 MED ORDER — ALPRAZOLAM 0.25 MG PO TABS: 0.2500 mg | ORAL_TABLET | Freq: Two times a day (BID) | ORAL | 1 refills | Status: DC | PRN

## 2020-07-15 DIAGNOSIS — Z9989 Dependence on other enabling machines and devices: Secondary | ICD-10-CM | POA: Insufficient documentation

## 2020-07-15 DIAGNOSIS — G4733 Obstructive sleep apnea (adult) (pediatric): Secondary | ICD-10-CM | POA: Insufficient documentation

## 2020-07-15 DIAGNOSIS — I5189 Other ill-defined heart diseases: Secondary | ICD-10-CM | POA: Insufficient documentation

## 2020-07-21 ENCOUNTER — Other Ambulatory Visit: Payer: Self-pay

## 2020-07-21 MED ORDER — MONTELUKAST SODIUM 10 MG PO TABS: 10.0000 mg | ORAL_TABLET | Freq: Every day | ORAL | 3 refills | Status: DC

## 2020-08-20 ENCOUNTER — Ambulatory Visit (INDEPENDENT_AMBULATORY_CARE_PROVIDER_SITE_OTHER): Payer: Medicare Other | Admitting: Nurse Practitioner

## 2020-08-20 ENCOUNTER — Other Ambulatory Visit: Payer: Self-pay

## 2020-08-20 ENCOUNTER — Encounter: Payer: Self-pay | Admitting: Nurse Practitioner

## 2020-08-20 VITALS — BP 142/80 | HR 80 | Temp 97.7°F | Resp 16 | Ht 61.0 in | Wt 148.6 lb

## 2020-08-20 DIAGNOSIS — I10 Essential (primary) hypertension: Secondary | ICD-10-CM | POA: Diagnosis not present

## 2020-08-20 DIAGNOSIS — N952 Postmenopausal atrophic vaginitis: Secondary | ICD-10-CM | POA: Insufficient documentation

## 2020-08-20 DIAGNOSIS — G8929 Other chronic pain: Secondary | ICD-10-CM

## 2020-08-20 DIAGNOSIS — M545 Low back pain, unspecified: Secondary | ICD-10-CM

## 2020-08-20 DIAGNOSIS — K581 Irritable bowel syndrome with constipation: Secondary | ICD-10-CM

## 2020-08-20 DIAGNOSIS — F5101 Primary insomnia: Secondary | ICD-10-CM | POA: Diagnosis not present

## 2020-08-20 DIAGNOSIS — J4540 Moderate persistent asthma, uncomplicated: Secondary | ICD-10-CM | POA: Diagnosis not present

## 2020-08-20 MED ORDER — ESTRADIOL 0.1 MG/GM VA CREA: TOPICAL_CREAM | VAGINAL | 1 refills | Status: DC

## 2020-08-20 MED ORDER — HYDROCODONE-ACETAMINOPHEN 5-300 MG PO TABS: 1.0000 | ORAL_TABLET | Freq: Two times a day (BID) | ORAL | 0 refills | Status: DC | PRN

## 2020-08-20 NOTE — Progress Notes (Signed)
[facility] [place], [place] [address]  Internal MEDICINE  Office Visit Note  Patient Name: Bonnie West  [id number]  [id number]  Date of Service: 08/20/2020  Chief Complaint  Patient presents with  . Follow-up  . Depression  . Gastroesophageal Reflux  . Hyperlipidemia  . Hypertension  . Medication Refill    The patient is here for routine follow up visit. She has complaint of intermittent loose stools. States that she has been taking OTC miralax to help with chronic constipation and has now gone in the opposite direction. Has begun to cut back on use of miralax.  She does have chronic lower back pain. She does, occassionally, take hydrocodone/APAP 5/300mg  when needed for severe pain. She gets a prescription for #15 tablets at her in office visits. She does need to have a new prescription for this today. The last time she had this filled was 05/07/2020.   does have trouble with sleep. States that she will lay in bed for hours and not be able to get to sleep. She does take alprazolam as needed for anxiety. States that this does not help her sleep and does not make her feel tired at all. Was started on ambien 5mg  at bedtime. She takes 1/2 tablet on some nights to help her get to sleep. It does help a little. She still has some left in initial prescription and still has a refill left.          Current Medication: Outpatient Encounter Medications as of 08/20/2020  Medication Sig  . acetaminophen (TYLENOL) 500 MG tablet Take 1,000 mg by mouth 2 (two) times daily as needed for mild pain or moderate pain.  Marland Kitchen albuterol (PROVENTIL) (2.5 MG/3ML) 0.083% nebulizer solution Take 3 mLs (2.5 mg total) by nebulization every 6 (six) hours as needed for wheezing or shortness of breath.  Marland Kitchen albuterol (VENTOLIN HFA) 108 (90 Base) MCG/ACT inhaler INHALE 2 PUFFS BY MOUTH EVERY 6 HOURS  . ALPRAZolam (XANAX) 0.25 MG tablet Take 1 tablet (0.25 mg total) by mouth 2 (two) times daily  as needed for anxiety.  . bisacodyl (DULCOLAX) 5 MG EC tablet Take 5-10 mg by mouth at bedtime as needed for moderate constipation. Two at night  . cetirizine (ZYRTEC) 10 MG tablet Take 10 mg by mouth daily.  . cholecalciferol (VITAMIN D) 1000 units tablet Take 1,000 Units by mouth at bedtime.  . clindamycin (CLEOCIN T) 1 % external solution Apply 1 application topically 2 (two) times daily as needed. For skin breakdown  . cyclobenzaprine (FLEXERIL) 5 MG tablet Take 1-2 tablets (5-10 mg total) by mouth 2 (two) times daily as needed for muscle spasms.  Marland Kitchen docusate sodium (COLACE) 100 MG capsule Take 100-200 mg by mouth at bedtime as needed for mild constipation or moderate constipation.   Marland Kitchen estradiol (ESTRACE) 0.1 MG/GM vaginal cream INSERT 1 GRAM VAGINALLY TWICE WEEKLY AT BEDTIME  . fenofibrate (TRICOR) 145 MG tablet Take 1 tablet (145 mg total) by mouth daily.  Marland Kitchen FLUoxetine (PROZAC) 40 MG capsule TAKE 1 CAPSULE BY MOUTH EVERY DAY  . fluticasone (FLOVENT HFA) 110 MCG/ACT inhaler TAKE 1 PUFF BY MOUTH TWICE A DAY  . HYDROcodone-Acetaminophen 5-300 MG TABS Take 1 tablet by mouth 2 (two) times daily as needed.  Marland Kitchen ipratropium (ATROVENT HFA) 17 MCG/ACT inhaler INHALE 2 PUFFS 4 TIMES DAILY  . ipratropium (ATROVENT) 0.02 % nebulizer solution Use 1 vial nebulized QID prn shortness of breath/wheezing.  . isometheptene-acetaminophen-dichloralphenazone (MIDRIN) 65-100-325 MG capsule  Take 1 capsule by mouth 4 (four) times daily as needed for migraine. Maximum 5 capsules in 12 hours for migraine headaches, 8 capsules in 24 hours for tension headaches.  . levothyroxine (SYNTHROID) 100 MCG tablet TAKE 1 TABLET BY MOUTH EVERY DAY IN THE MORNING  . lisinopril (ZESTRIL) 10 MG tablet Take 1 tablet (10 mg total) by mouth daily.  . magnesium oxide (MAG-OX) 400 MG tablet Take 400 mg by mouth at bedtime.   . montelukast (SINGULAIR) 10 MG tablet Take 1 tablet (10 mg total) by mouth daily.  Marland Kitchen omeprazole (PRILOSEC) 40 MG  capsule TAKE 1 CAPSULE BY MOUTH EVERY DAY  . polyethylene glycol powder (GLYCOLAX/MIRALAX) 17 GM/SCOOP powder Take 17 g by mouth every evening.  . triamcinolone cream (KENALOG) 0.1 % Apply 1 application topically 2 (two) times daily as needed. For skin breakdown  . vitamin B-12 (CYANOCOBALAMIN) 1000 MCG tablet Take 1,000 mcg by mouth daily.  Marland Kitchen zolpidem (AMBIEN) 5 MG tablet Take 1/2 to 1 tablets po QHS prn insomnia  . [DISCONTINUED] estradiol (ESTRACE) 0.1 MG/GM vaginal cream INSERT 1 GRAM VAGINALLY TWICE WEEKLY AT BEDTIME  . [DISCONTINUED] HYDROcodone-Acetaminophen 5-300 MG TABS Take 1 tablet by mouth 2 (two) times daily as needed. For severe pain   No facility-administered encounter medications on file as of 08/20/2020.    Surgical History: Past Surgical History:  Procedure Laterality Date  . BUNIONECTOMY    . CATARACT EXTRACTION W/PHACO Right 10/11/2016   Procedure: CATARACT EXTRACTION PHACO AND INTRAOCULAR LENS PLACEMENT (IOC);  Surgeon: Birder Robson, MD;  Location: [facility] ORS;  Service: Ophthalmology;  Laterality: Right;  Lot# [id number] H [place]: 00:37.7 AP%: 18.1 CDE: 6.80  . CATARACT EXTRACTION W/PHACO Left 11/08/2016   Procedure: CATARACT EXTRACTION PHACO AND INTRAOCULAR LENS PLACEMENT (IOC);  Surgeon: Birder Robson, MD;  Location: [facility] ORS;  Service: Ophthalmology;  Laterality: Left;  PACK LOT: [id number] H US:00:32 AP:44 CDE:6.46  . COLONOSCOPY    . COLONOSCOPY WITH PROPOFOL N/A 11/26/2015   Procedure: COLONOSCOPY WITH PROPOFOL;  Surgeon: Manya Silvas, MD;  Location: [facility] ENDOSCOPY;  Service: Endoscopy;  Laterality: N/A;  . EYE SURGERY    . TONSILLECTOMY      Medical History: Past Medical History:  Diagnosis Date  . Allergic rhinitis   . Anxiety   . Asthma   . Blood transfusion without reported diagnosis   . Depression   . Diverticulosis   . Dyspnea   . Esophagitis   . GERD (gastroesophageal reflux disease)   . Headache   . Heart murmur   . Hyperlipemia   .  Hypertension   . Hypothyroidism   . Obesity   . Osteopenia   . Palpitations   . Pneumothorax   . Sleep apnea     Family History: Family History  Problem Relation Age of Onset  . Breast cancer Maternal Grandmother   . Stroke Maternal Grandmother   . Bladder Cancer Father   . Prostate cancer Father   . Heart attack Father   . Aortic aneurysm Father   . Congestive Heart Failure Father   . Colon cancer Paternal Grandmother   . Aneurysm Paternal Grandmother   . Stroke Paternal Grandmother   . Congestive Heart Failure Paternal Grandmother   . Dementia Mother   . Stroke Mother   . Dementia Maternal Grandfather   . Stroke Maternal Grandfather   . Dementia Paternal Grandfather   . Stroke Paternal Grandfather     Social History   Socioeconomic History  . Marital status: Married  Spouse name: Not on file  . Number of children: Not on file  . Years of education: Not on file  . Highest education level: Not on file  Occupational History  . Not on file  Tobacco Use  . Smoking status: Former Research scientist (life sciences)  . Smokeless tobacco: Never Used  Vaping Use  . Vaping Use: Never used  Substance and Sexual Activity  . Alcohol use: No  . Drug use: No  . Sexual activity: Not Currently    Partners: Male    Birth control/protection: Post-menopausal  Other Topics Concern  . Not on file  Social History Narrative  . Not on file   Social Determinants of Health   Financial Resource Strain:   . Difficulty of Paying Living Expenses: Not on file  Food Insecurity:   . Worried About Charity fundraiser in the Last Year: Not on file  . Ran Out of Food in the Last Year: Not on file  Transportation Needs:   . Lack of Transportation (Medical): Not on file  . Lack of Transportation (Non-Medical): Not on file  Physical Activity:   . Days of Exercise per Week: Not on file  . Minutes of Exercise per Session: Not on file  Stress:   . Feeling of Stress : Not on file  Social Connections:   .  Frequency of Communication with Friends and Family: Not on file  . Frequency of Social Gatherings with Friends and Family: Not on file  . Attends Religious Services: Not on file  . Active Member of Clubs or Organizations: Not on file  . Attends Archivist Meetings: Not on file  . Marital Status: Not on file  Intimate Partner Violence:   . Fear of Current or Ex-Partner: Not on file  . Emotionally Abused: Not on file  . Physically Abused: Not on file  . Sexually Abused: Not on file      Review of Systems  Constitutional: Positive for fatigue. Negative for activity change, chills and unexpected weight change.  HENT: Negative for congestion, postnasal drip, rhinorrhea, sneezing, sore throat and voice change.   Respiratory: Positive for cough. Negative for chest tightness, shortness of breath and wheezing.        Intermittent  Cardiovascular: Negative for chest pain and palpitations.  Gastrointestinal: Negative for abdominal pain, constipation, diarrhea, nausea and vomiting.  Endocrine: Negative for cold intolerance, heat intolerance, polydipsia and polyuria.  Musculoskeletal: Positive for arthralgias, back pain and joint swelling. Negative for neck pain.       Intermittent lower back pain.   Skin: Negative for rash.  Allergic/Immunologic: Positive for environmental allergies.  Neurological: Negative for dizziness, tremors, numbness and headaches.  Hematological: Negative for adenopathy. Does not bruise/bleed easily.  Psychiatric/Behavioral: Positive for dysphoric mood. Negative for behavioral problems (Depression), sleep disturbance and suicidal ideas. The patient is nervous/anxious.     Today's Vitals   08/20/20 1404  BP: (!) 142/80  Pulse: 80  Resp: 16  Temp: 97.7 F (36.5 C)  SpO2: 97%  Weight: 148 lb 9.6 oz (67.4 kg)  Height: 5\' 1"  (1.549 m)   Body mass index is 28.08 kg/m.  Physical Exam Vitals and nursing note reviewed.  Constitutional:      General: She  is not in acute distress.    Appearance: Normal appearance. She is well-developed. She is not diaphoretic.  HENT:     Head: Normocephalic and atraumatic.     Mouth/Throat:     Pharynx: No oropharyngeal exudate.  Eyes:  Pupils: Pupils are equal, round, and reactive to light.  Neck:     Thyroid: No thyromegaly.     Vascular: No carotid bruit or JVD.     Trachea: No tracheal deviation.  Cardiovascular:     Rate and Rhythm: Normal rate and regular rhythm.     Heart sounds: Normal heart sounds. No murmur heard.  No friction rub. No gallop.   Pulmonary:     Effort: Pulmonary effort is normal. No respiratory distress.     Breath sounds: Normal breath sounds. No wheezing or rales.  Chest:     Chest wall: No tenderness.  Abdominal:     Palpations: Abdomen is soft.  Musculoskeletal:        General: Normal range of motion.     Cervical back: Normal range of motion and neck supple.  Lymphadenopathy:     Cervical: No cervical adenopathy.  Skin:    General: Skin is warm and dry.  Neurological:     Mental Status: She is alert and oriented to person, place, and time.     Cranial Nerves: No cranial nerve deficit.  Psychiatric:        Attention and Perception: Attention and perception normal.        Mood and Affect: Affect normal. Mood is anxious. Mood is not depressed.        Speech: Speech normal.        Behavior: Behavior normal. Behavior is cooperative.        Thought Content: Thought content normal.        Cognition and Memory: Cognition and memory normal.        Judgment: Judgment normal.    Assessment/Plan: 1. Essential hypertension, benign Stable. Continue bp medication as prescribed   2. Chronic midline low back pain without sciatica Very intermittent use of hydrocodone/APAP 5/300mg  up to twice daily. A new prescription for #15 tablets was sent to her pharmacy. Her last fill was 05/07/2020. - HYDROcodone-Acetaminophen 5-300 MG TABS; Take 1 tablet by mouth 2 (two) times daily  as needed.  Dispense: 15 tablet; Refill: 0  3. Moderate persistent asthma without status asthmaticus without complication Stable. Continue respiratory medications and inhalers as prescribed. Follow up with Dr. Devona Konig as scheduled.   4. Vaginal atrophy Renew estrace cream. Use as directed  - estradiol (ESTRACE) 0.1 MG/GM vaginal cream; INSERT 1 GRAM VAGINALLY TWICE WEEKLY AT BEDTIME  Dispense: 42.5 g; Refill: 1  5. Primary insomnia May take previously prescribed ambien as needed.  6. Irritable bowel syndrome with constipation Continue to use OTC medication as needed for chronic constipation.   General Counseling: arizona sorn understanding of the findings of todays visit and agrees with plan of treatment. I have discussed any further diagnostic evaluation that may be needed or ordered today. We also reviewed her medications today. she has been encouraged to call the office with any questions or concerns that should arise related to todays visit.  Reviewed risks and possible side effects associated with taking opiates, benzodiazepines and other CNS depressants. Combination of these could cause dizziness and drowsiness. Advised patient not to drive or operate machinery when taking these medications, as patient's and other's life can be at risk and will have consequences. Patient verbalized understanding in this matter. Dependence and abuse for these drugs will be monitored closely. A Controlled substance policy and procedure is on file which allows [facility] to order a urine drug screen test at any visit. Patient understands and agrees with the  plan  This patient was seen by [place] with Dr Lavera Guise as a part of collaborative care agreement  Meds ordered this encounter  Medications  . estradiol (ESTRACE) 0.1 MG/GM vaginal cream    Sig: INSERT 1 GRAM VAGINALLY TWICE WEEKLY AT BEDTIME    Dispense:  42.5 g    Refill:  1    Order Specific  Question:   Supervising Provider    Answer:   Lavera Guise [[id number]]  . HYDROcodone-Acetaminophen 5-300 MG TABS    Sig: Take 1 tablet by mouth 2 (two) times daily as needed.    Dispense:  15 tablet    Refill:  0    Order Specific Question:   Supervising Provider    Answer:   Lavera Guise [[id number]]    Total time spent: 30 Minutes   Time spent includes review of chart, medications, test results, and follow up plan with the patient.      Dr Lavera Guise Internal medicine

## 2020-09-09 ENCOUNTER — Other Ambulatory Visit: Payer: Self-pay | Admitting: Adult Health

## 2020-09-09 DIAGNOSIS — J4540 Moderate persistent asthma, uncomplicated: Secondary | ICD-10-CM

## 2020-09-14 ENCOUNTER — Other Ambulatory Visit: Payer: Self-pay

## 2020-09-14 MED ORDER — MONTELUKAST SODIUM 10 MG PO TABS: 10.0000 mg | ORAL_TABLET | Freq: Every day | ORAL | 3 refills | Status: DC

## 2020-09-14 MED ORDER — FENOFIBRATE 145 MG PO TABS: 145.0000 mg | ORAL_TABLET | Freq: Every day | ORAL | 5 refills | Status: DC

## 2020-09-16 ENCOUNTER — Other Ambulatory Visit: Payer: Self-pay

## 2020-09-16 DIAGNOSIS — K219 Gastro-esophageal reflux disease without esophagitis: Secondary | ICD-10-CM

## 2020-09-16 MED ORDER — OMEPRAZOLE 40 MG PO CPDR: DELAYED_RELEASE_CAPSULE | ORAL | 3 refills | Status: DC

## 2020-10-12 ENCOUNTER — Ambulatory Visit (INDEPENDENT_AMBULATORY_CARE_PROVIDER_SITE_OTHER): Payer: Medicare Other | Admitting: Internal Medicine

## 2020-10-12 ENCOUNTER — Encounter: Payer: Self-pay | Admitting: Internal Medicine

## 2020-10-12 ENCOUNTER — Other Ambulatory Visit: Payer: Self-pay

## 2020-10-12 DIAGNOSIS — R0602 Shortness of breath: Secondary | ICD-10-CM | POA: Diagnosis not present

## 2020-10-12 DIAGNOSIS — G4733 Obstructive sleep apnea (adult) (pediatric): Secondary | ICD-10-CM | POA: Diagnosis not present

## 2020-10-12 DIAGNOSIS — J4540 Moderate persistent asthma, uncomplicated: Secondary | ICD-10-CM | POA: Diagnosis not present

## 2020-10-12 MED ORDER — ATROVENT HFA 17 MCG/ACT IN AERS: INHALATION_SPRAY | RESPIRATORY_TRACT | 3 refills | Status: DC

## 2020-10-12 NOTE — Progress Notes (Signed)
[facility] [place], [place] [address]  Pulmonary Sleep Medicine   Office Visit Note  Patient Name: Bonnie West DOB: [birth year] MRN [id number]  Date of Service: 10/12/2020  Complaints/HPI: Patient is here for routine pulmonary follow-up She reports her breathing has been well, at her baseline She continues to use her inhalers--feels this helps control her symptoms Still not using her CPAP machine, it has been well over a year since wearing her machine, she was not able to tolerate her machine, felt as though she was getting too much pressure--not interested at this time in getting a new machine, she will consider this in the New Year  ROS  General: (-) fever, (-) chills, (-) night sweats, (-) weakness Skin: (-) rashes, (-) itching,. Eyes: (-) visual changes, (-) redness, (-) itching. Nose and Sinuses: (-) nasal stuffiness or itchiness, (-) postnasal drip, (-) nosebleeds, (-) sinus trouble. Mouth and Throat: (-) sore throat, (-) hoarseness. Neck: (-) swollen glands, (-) enlarged thyroid, (-) neck pain. Respiratory: - cough, (-) bloody sputum, - shortness of breath, - wheezing. Cardiovascular: - ankle swelling, (-) chest pain. Lymphatic: (-) lymph node enlargement. Neurologic: (-) numbness, (-) tingling. Psychiatric: (-) anxiety, (-) depression   Current Medication: Outpatient Encounter Medications as of 10/12/2020  Medication Sig  . acetaminophen (TYLENOL) 500 MG tablet Take 1,000 mg by mouth 2 (two) times daily as needed for mild pain or moderate pain.  Marland Kitchen albuterol (PROVENTIL) (2.5 MG/3ML) 0.083% nebulizer solution Take 3 mLs (2.5 mg total) by nebulization every 6 (six) hours as needed for wheezing or shortness of breath.  Marland Kitchen albuterol (VENTOLIN HFA) 108 (90 Base) MCG/ACT inhaler INHALE 2 PUFFS BY MOUTH EVERY 6 HOURS  . ALPRAZolam (XANAX) 0.25 MG tablet Take 1 tablet (0.25 mg total) by mouth 2 (two) times daily as needed for anxiety.  . bisacodyl  (DULCOLAX) 5 MG EC tablet Take 5-10 mg by mouth at bedtime as needed for moderate constipation. Two at night  . cetirizine (ZYRTEC) 10 MG tablet Take 10 mg by mouth daily.  . cholecalciferol (VITAMIN D) 1000 units tablet Take 1,000 Units by mouth at bedtime.  . clindamycin (CLEOCIN T) 1 % external solution Apply 1 application topically 2 (two) times daily as needed. For skin breakdown  . cyclobenzaprine (FLEXERIL) 5 MG tablet Take 1-2 tablets (5-10 mg total) by mouth 2 (two) times daily as needed for muscle spasms.  Marland Kitchen docusate sodium (COLACE) 100 MG capsule Take 100-200 mg by mouth at bedtime as needed for mild constipation or moderate constipation.   Marland Kitchen estradiol (ESTRACE) 0.1 MG/GM vaginal cream INSERT 1 GRAM VAGINALLY TWICE WEEKLY AT BEDTIME  . fenofibrate (TRICOR) 145 MG tablet Take 1 tablet (145 mg total) by mouth daily.  Marland Kitchen FLOVENT HFA 110 MCG/ACT inhaler TAKE 1 PUFF BY MOUTH TWICE A DAY  . FLUoxetine (PROZAC) 40 MG capsule TAKE 1 CAPSULE BY MOUTH EVERY DAY  . HYDROcodone-Acetaminophen 5-300 MG TABS Take 1 tablet by mouth 2 (two) times daily as needed.  Marland Kitchen ipratropium (ATROVENT HFA) 17 MCG/ACT inhaler INHALE 2 PUFFS 4 TIMES DAILY  . ipratropium (ATROVENT) 0.02 % nebulizer solution Use 1 vial nebulized QID prn shortness of breath/wheezing.  . isometheptene-acetaminophen-dichloralphenazone (MIDRIN) 65-100-325 MG capsule Take 1 capsule by mouth 4 (four) times daily as needed for migraine. Maximum 5 capsules in 12 hours for migraine headaches, 8 capsules in 24 hours for tension headaches.  . levothyroxine (SYNTHROID) 100 MCG tablet TAKE 1 TABLET BY MOUTH EVERY DAY IN THE  MORNING  . lisinopril (ZESTRIL) 10 MG tablet Take 1 tablet (10 mg total) by mouth daily.  . magnesium oxide (MAG-OX) 400 MG tablet Take 400 mg by mouth at bedtime.   . montelukast (SINGULAIR) 10 MG tablet Take 1 tablet (10 mg total) by mouth daily.  Marland Kitchen omeprazole (PRILOSEC) 40 MG capsule TAKE 1 CAPSULE BY MOUTH EVERY DAY  .  polyethylene glycol powder (GLYCOLAX/MIRALAX) 17 GM/SCOOP powder Take 17 g by mouth every evening.  . triamcinolone cream (KENALOG) 0.1 % Apply 1 application topically 2 (two) times daily as needed. For skin breakdown  . vitamin B-12 (CYANOCOBALAMIN) 1000 MCG tablet Take 1,000 mcg by mouth daily.  Marland Kitchen zolpidem (AMBIEN) 5 MG tablet Take 1/2 to 1 tablets po QHS prn insomnia   No facility-administered encounter medications on file as of 10/12/2020.    Surgical History: Past Surgical History:  Procedure Laterality Date  . BUNIONECTOMY    . CATARACT EXTRACTION W/PHACO Right 10/11/2016   Procedure: CATARACT EXTRACTION PHACO AND INTRAOCULAR LENS PLACEMENT (IOC);  Surgeon: Birder Robson, MD;  Location: [facility] ORS;  Service: Ophthalmology;  Laterality: Right;  Lot# [id number] H [place]: 00:37.7 AP%: 18.1 CDE: 6.80  . CATARACT EXTRACTION W/PHACO Left 11/08/2016   Procedure: CATARACT EXTRACTION PHACO AND INTRAOCULAR LENS PLACEMENT (IOC);  Surgeon: Birder Robson, MD;  Location: [facility] ORS;  Service: Ophthalmology;  Laterality: Left;  PACK LOT: [id number] H US:00:32 AP:44 CDE:6.46  . COLONOSCOPY    . COLONOSCOPY WITH PROPOFOL N/A 11/26/2015   Procedure: COLONOSCOPY WITH PROPOFOL;  Surgeon: Manya Silvas, MD;  Location: [facility] ENDOSCOPY;  Service: Endoscopy;  Laterality: N/A;  . EYE SURGERY    . TONSILLECTOMY      Medical History: Past Medical History:  Diagnosis Date  . Allergic rhinitis   . Anxiety   . Asthma   . Blood transfusion without reported diagnosis   . Depression   . Diverticulosis   . Dyspnea   . Esophagitis   . GERD (gastroesophageal reflux disease)   . Headache   . Heart murmur   . Hyperlipemia   . Hypertension   . Hypothyroidism   . Obesity   . Osteopenia   . Palpitations   . Pneumothorax   . Sleep apnea     Family History: Family History  Problem Relation Age of Onset  . Breast cancer Maternal Grandmother   . Stroke Maternal Grandmother   . Bladder Cancer Father   .  Prostate cancer Father   . Heart attack Father   . Aortic aneurysm Father   . Congestive Heart Failure Father   . Colon cancer Paternal Grandmother   . Aneurysm Paternal Grandmother   . Stroke Paternal Grandmother   . Congestive Heart Failure Paternal Grandmother   . Dementia Mother   . Stroke Mother   . Dementia Maternal Grandfather   . Stroke Maternal Grandfather   . Dementia Paternal Grandfather   . Stroke Paternal Grandfather     Social History: Social History   Socioeconomic History  . Marital status: Married    Spouse name: Not on file  . Number of children: Not on file  . Years of education: Not on file  . Highest education level: Not on file  Occupational History  . Not on file  Tobacco Use  . Smoking status: Former Research scientist (life sciences)  . Smokeless tobacco: Never Used  Vaping Use  . Vaping Use: Never used  Substance and Sexual Activity  . Alcohol use: No  . Drug use: No  . Sexual activity:  Not Currently    Partners: Male    Birth control/protection: Post-menopausal  Other Topics Concern  . Not on file  Social History Narrative  . Not on file   Social Determinants of Health   Financial Resource Strain:   . Difficulty of Paying Living Expenses: Not on file  Food Insecurity:   . Worried About Charity fundraiser in the Last Year: Not on file  . Ran Out of Food in the Last Year: Not on file  Transportation Needs:   . Lack of Transportation (Medical): Not on file  . Lack of Transportation (Non-Medical): Not on file  Physical Activity:   . Days of Exercise per Week: Not on file  . Minutes of Exercise per Session: Not on file  Stress:   . Feeling of Stress : Not on file  Social Connections:   . Frequency of Communication with Friends and Family: Not on file  . Frequency of Social Gatherings with Friends and Family: Not on file  . Attends Religious Services: Not on file  . Active Member of Clubs or Organizations: Not on file  . Attends Archivist Meetings:  Not on file  . Marital Status: Not on file  Intimate Partner Violence:   . Fear of Current or Ex-Partner: Not on file  . Emotionally Abused: Not on file  . Physically Abused: Not on file  . Sexually Abused: Not on file    Vital Signs: Blood pressure 126/80, pulse 86, temperature 98.6 F (37 C), resp. rate 16, height 5\' 1"  (1.549 m), weight 150 lb 9.6 oz (68.3 kg), SpO2 96 %.  Examination: General Appearance: The patient is well-developed, well-nourished, and in no distress. Skin: Gross inspection of skin unremarkable. Head: normocephalic, no gross deformities. Eyes: no gross deformities noted. ENT: ears appear grossly normal no exudates. Neck: Supple. No thyromegaly. No LAD. Respiratory: Clear throughout, no rhonchi, rales or wheezing noted. Cardiovascular: Normal S1 and S2 without murmur or rub. Extremities: No cyanosis. pulses are equal. Neurologic: Alert and oriented. No involuntary movements.  LABS: No results found for this or any previous visit (from the past 2160 hour(s)).  Radiology: No results found.  No results found.  No results found.    Assessment and Plan: Patient Active Problem List   Diagnosis Date Noted  . Vaginal atrophy 08/20/2020  . Diastolic dysfunction 16/96/7893  . OSA on CPAP 07/15/2020  . Tightness in chest 05/20/2020  . Hypercalcemia 05/20/2020  . Primary insomnia 05/20/2020  . Acute non-recurrent pansinusitis 02/12/2020  . Encounter for general adult medical examination with abnormal findings 11/10/2019  . Gastroesophageal reflux disease without esophagitis 11/10/2019  . Dysuria 11/10/2019  . Acute otitis externa of left ear 07/15/2019  . Irritable bowel syndrome with constipation 07/15/2019  . Chronic midline low back pain without sciatica 04/27/2019  . Allergic rhinitis due to pollen 04/27/2019  . Essential hypertension, benign 04/27/2019  . GAD (generalized anxiety disorder) 09/01/2018  . Chronic migraine without aura without  status migrainosus, not intractable 09/01/2018  . Encounter for long-term (current) use of medications 09/01/2018  . Asthma without status asthmaticus 01/30/2018  . Hyperlipidemia, unspecified 01/30/2018  . Hypertension 01/30/2018  . Obesity, unspecified 01/30/2018  . Hypothyroidism 01/30/2018  . Acute respiratory failure with hypoxia ([place]) 12/31/2017  . Fatty infiltration of liver 12/10/2015  . Abdominal pain, LLQ (left lower quadrant) 11/03/2015  . History of adenomatous polyp of colon 11/03/2015  . Hallux valgus, left 09/18/2014  . 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  .  Shoulder arthritis 12/19/2013    1. Moderate persistent asthma without status asthmaticus without complication Symptoms remained well controlled, requesting refills - ipratropium (ATROVENT HFA) 17 MCG/ACT inhaler; INHALE 2 PUFFS 4 TIMES DAILY  Dispense: 1 each; Refill: 3  2. OSA (obstructive sleep apnea) Discussed importance of CPAP therapy--will strongly consider restarting CPAP therapy in the new year--will need a new machine, may benefit from titration study  3. SOB (shortness of breath) Normal spirometry today--FEV1 1.9 L, 95% predicted - Spirometry with Graph  General Counseling: I have discussed the findings of the evaluation and examination with Vaughan Basta.  I have also discussed any further diagnostic evaluation thatmay be needed or ordered today. Freida verbalizes understanding of the findings of todays visit. We also reviewed her medications today and discussed drug interactions and side effects including but not limited excessive drowsiness and altered mental states. We also discussed that there is always a risk not just to her but also people around her. she has been encouraged to call the office with any questions or concerns that should arise related to todays visit.  Orders Placed This Encounter  Procedures  . Spirometry with Graph    Order Specific Question:   Where should this test be performed?     Answer:   [facility]    Order Specific Question:   Basic spirometry    Answer:   Yes    Order Specific Question:   Spirometry pre & post bronchodilator    Answer:   No     Time spent: 30  I have personally obtained a history, examined the patient, evaluated laboratory and imaging results, formulated the assessment and plan and placed orders. This patient was seen by Casey Burkitt AGNP-C in Collaboration with Dr. Devona Konig as a part of collaborative care agreement.    Allyne Gee, MD [facility] Pulmonary and Critical Care Sleep medicine

## 2020-10-12 NOTE — Patient Instructions (Signed)
Sleep Apnea Sleep apnea is a condition in which breathing pauses or becomes shallow during sleep. Episodes of sleep apnea usually last 10 seconds or longer, and they may occur as many as 20 times an hour. Sleep apnea disrupts your sleep and keeps your body from getting the rest that it needs. This condition can increase your risk of certain health problems, including:  Heart attack.  Stroke.  Obesity.  Diabetes.  Heart failure.  Irregular heartbeat. What are the causes? There are three kinds of sleep apnea:  Obstructive sleep apnea. This kind is caused by a blocked or collapsed airway.  Central sleep apnea. This kind happens when the part of the brain that controls breathing does not send the correct signals to the muscles that control breathing.  Mixed sleep apnea. This is a combination of obstructive and central sleep apnea. The most common cause of this condition is a collapsed or blocked airway. An airway can collapse or become blocked if:  Your throat muscles are abnormally relaxed.  Your tongue and tonsils are larger than normal.  You are overweight.  Your airway is smaller than normal. What increases the risk? You are more likely to develop this condition if you:  Are overweight.  Smoke.  Have a smaller than normal airway.  Are elderly.  Are female.  Drink alcohol.  Take sedatives or tranquilizers.  Have a family history of sleep apnea. What are the signs or symptoms? Symptoms of this condition include:  Trouble staying asleep.  Daytime sleepiness and tiredness.  Irritability.  Loud snoring.  Morning headaches.  Trouble concentrating.  Forgetfulness.  Decreased interest in sex.  Unexplained sleepiness.  Mood swings.  Personality changes.  Feelings of depression.  Waking up often during the night to urinate.  Dry mouth.  Sore throat. How is this diagnosed? This condition may be diagnosed with:  A medical history.  A physical  exam.  A series of tests that are done while you are sleeping (sleep study). These tests are usually done in a sleep lab, but they may also be done at home. How is this treated? Treatment for this condition aims to restore normal breathing and to ease symptoms during sleep. It may involve managing health issues that can affect breathing, such as high blood pressure or obesity. Treatment may include:  Sleeping on your side.  Using a decongestant if you have nasal congestion.  Avoiding the use of depressants, including alcohol, sedatives, and narcotics.  Losing weight if you are overweight.  Making changes to your diet.  Quitting smoking.  Using a device to open your airway while you sleep, such as: ? An oral appliance. This is a custom-made mouthpiece that shifts your lower jaw forward. ? A continuous positive airway pressure (CPAP) device. This device blows air through a mask when you breathe out (exhale). ? A nasal expiratory positive airway pressure (EPAP) device. This device has valves that you put into each nostril. ? A bi-level positive airway pressure (BPAP) device. This device blows air through a mask when you breathe in (inhale) and breathe out (exhale).  Having surgery if other treatments do not work. During surgery, excess tissue is removed to create a wider airway. It is important to get treatment for sleep apnea. Without treatment, this condition can lead to:  High blood pressure.  Coronary artery disease.  In men, an inability to achieve or maintain an erection (impotence).  Reduced thinking abilities. Follow these instructions at home: Lifestyle  Make any lifestyle changes   that your health care provider recommends.  Eat a healthy, well-balanced diet.  Take steps to lose weight if you are overweight.  Avoid using depressants, including alcohol, sedatives, and narcotics.  Do not use any products that contain nicotine or tobacco, such as cigarettes,  e-cigarettes, and chewing tobacco. If you need help quitting, ask your health care provider. General instructions  Take over-the-counter and prescription medicines only as told by your health care provider.  If you were given a device to open your airway while you sleep, use it only as told by your health care provider.  If you are having surgery, make sure to tell your health care provider you have sleep apnea. You may need to bring your device with you.  Keep all follow-up visits as told by your health care provider. This is important. Contact a health care provider if:  The device that you received to open your airway during sleep is uncomfortable or does not seem to be working.  Your symptoms do not improve.  Your symptoms get worse. Get help right away if:  You develop: ? Chest pain. ? Shortness of breath. ? Discomfort in your back, arms, or stomach.  You have: ? Trouble speaking. ? Weakness on one side of your body. ? Drooping in your face. These symptoms may represent a serious problem that is an emergency. Do not wait to see if the symptoms will go away. Get medical help right away. Call your local emergency services (911 in the [place] Do not drive yourself to the hospital. Summary  Sleep apnea is a condition in which breathing pauses or becomes shallow during sleep.  The most common cause is a collapsed or blocked airway.  The goal of treatment is to restore normal breathing and to ease symptoms during sleep. This information is not intended to replace advice given to you by your health care provider. Make sure you discuss any questions you have with your health care provider. Document Revised: 04/24/2019 Document Reviewed: 07/03/2018 Elsevier Patient Education  2020 [employer]  

## 2020-11-02 ENCOUNTER — Other Ambulatory Visit: Payer: Self-pay

## 2020-11-02 DIAGNOSIS — M545 Low back pain, unspecified: Secondary | ICD-10-CM

## 2020-11-02 DIAGNOSIS — G8929 Other chronic pain: Secondary | ICD-10-CM

## 2020-11-02 MED ORDER — CYCLOBENZAPRINE HCL 5 MG PO TABS: 5.0000 mg | ORAL_TABLET | Freq: Two times a day (BID) | ORAL | 2 refills | Status: DC | PRN

## 2020-11-05 ENCOUNTER — Ambulatory Visit (INDEPENDENT_AMBULATORY_CARE_PROVIDER_SITE_OTHER): Payer: Medicare Other | Admitting: Nurse Practitioner

## 2020-11-05 ENCOUNTER — Other Ambulatory Visit: Payer: Self-pay

## 2020-11-05 ENCOUNTER — Encounter: Payer: Self-pay | Admitting: Nurse Practitioner

## 2020-11-05 VITALS — BP 132/78 | HR 87 | Temp 97.4°F | Resp 16 | Ht 61.0 in | Wt 149.8 lb

## 2020-11-05 DIAGNOSIS — M545 Low back pain, unspecified: Secondary | ICD-10-CM | POA: Diagnosis not present

## 2020-11-05 DIAGNOSIS — J301 Allergic rhinitis due to pollen: Secondary | ICD-10-CM | POA: Diagnosis not present

## 2020-11-05 DIAGNOSIS — J4540 Moderate persistent asthma, uncomplicated: Secondary | ICD-10-CM

## 2020-11-05 DIAGNOSIS — G8929 Other chronic pain: Secondary | ICD-10-CM

## 2020-11-05 DIAGNOSIS — R300 Dysuria: Secondary | ICD-10-CM | POA: Diagnosis not present

## 2020-11-05 DIAGNOSIS — Z1231 Encounter for screening mammogram for malignant neoplasm of breast: Secondary | ICD-10-CM

## 2020-11-05 DIAGNOSIS — Z0001 Encounter for general adult medical examination with abnormal findings: Secondary | ICD-10-CM | POA: Diagnosis not present

## 2020-11-05 DIAGNOSIS — I10 Essential (primary) hypertension: Secondary | ICD-10-CM

## 2020-11-05 MED ORDER — HYDROCODONE-ACETAMINOPHEN 5-300 MG PO TABS: 1.0000 | ORAL_TABLET | Freq: Two times a day (BID) | ORAL | 0 refills | Status: DC | PRN

## 2020-11-05 MED ORDER — ATROVENT HFA 17 MCG/ACT IN AERS: INHALATION_SPRAY | RESPIRATORY_TRACT | 3 refills | Status: DC

## 2020-11-05 MED ORDER — FLOVENT HFA 110 MCG/ACT IN AERO: INHALATION_SPRAY | RESPIRATORY_TRACT | 3 refills | Status: DC

## 2020-11-05 NOTE — Progress Notes (Signed)
[facility] [place],  [address]  Internal MEDICINE  Office Visit Note  Patient Name: Bonnie West  [id number]  [id number]  Date of Service: 11/11/2020   Pt is here for routine health maintenance examination   Chief Complaint  Patient presents with  . Medicare Wellness    Pt fell last Thursday, right knee and elbow hurt, discuss booster and flu shot  . Hyperlipidemia  . Hypertension  . Sleep Apnea  . Gastroesophageal Reflux  . Depression  . Anxiety  . Asthma     The patient is here for health maintenance exam.  -[place] last Thursday on her porch. Was looking at a Loss adjuster, chartered and not where she was going. Tripped and lost her balance. Hit right knee, right elbow, and left elbow on the glass door and cement flooring. She is bruised, but nothing broken. Is gradually getting better. Has been taking her pain medication a little more often this week because of the fall. Needs to have refill for this today.  The last time this was filled was 08/24/2020 and this is verified on her PDMP profile.  -sees GYN in [place] for management of pessary. Have been discussing hysterectomy with the patient. States she is not ready for this yet. -due to have screening mammogram. Has been putting this off due to spread of COVID 60.  -sees pulmonology for chronic asthma. Currently well managed. Does need refills for some of her inhalers today.  - due for screenign mammogram.    Current Medication: Outpatient Encounter Medications as of 11/05/2020  Medication Sig  . acetaminophen (TYLENOL) 500 MG tablet Take 1,000 mg by mouth 2 (two) times daily as needed for mild pain or moderate pain.  Marland Kitchen albuterol (PROVENTIL) (2.5 MG/3ML) 0.083% nebulizer solution Take 3 mLs (2.5 mg total) by nebulization every 6 (six) hours as needed for wheezing or shortness of breath.  Marland Kitchen albuterol (VENTOLIN HFA) 108 (90 Base) MCG/ACT inhaler INHALE 2 PUFFS BY MOUTH EVERY 6 HOURS  .  ALPRAZolam (XANAX) 0.25 MG tablet Take 1 tablet (0.25 mg total) by mouth 2 (two) times daily as needed for anxiety.  . bisacodyl (DULCOLAX) 5 MG EC tablet Take 5-10 mg by mouth at bedtime as needed for moderate constipation. Two at night  . cetirizine (ZYRTEC) 10 MG tablet Take 10 mg by mouth daily.  . cholecalciferol (VITAMIN D) 1000 units tablet Take 1,000 Units by mouth at bedtime.  . clindamycin (CLEOCIN T) 1 % external solution Apply 1 application topically 2 (two) times daily as needed. For skin breakdown  . cyclobenzaprine (FLEXERIL) 5 MG tablet Take 1-2 tablets (5-10 mg total) by mouth 2 (two) times daily as needed for muscle spasms.  Marland Kitchen docusate sodium (COLACE) 100 MG capsule Take 100-200 mg by mouth at bedtime as needed for mild constipation or moderate constipation.   Marland Kitchen estradiol (ESTRACE) 0.1 MG/GM vaginal cream INSERT 1 GRAM VAGINALLY TWICE WEEKLY AT BEDTIME  . fenofibrate (TRICOR) 145 MG tablet Take 1 tablet (145 mg total) by mouth daily.  Marland Kitchen FLUoxetine (PROZAC) 40 MG capsule TAKE 1 CAPSULE BY MOUTH EVERY DAY  . ipratropium (ATROVENT) 0.02 % nebulizer solution Use 1 vial nebulized QID prn shortness of breath/wheezing.  . isometheptene-acetaminophen-dichloralphenazone (MIDRIN) 65-100-325 MG capsule Take 1 capsule by mouth 4 (four) times daily as needed for migraine. Maximum 5 capsules in 12 hours for migraine headaches, 8 capsules in 24 hours for tension headaches.  . levothyroxine (SYNTHROID) 100 MCG tablet TAKE 1 TABLET BY  MOUTH EVERY DAY IN THE MORNING  . lisinopril (ZESTRIL) 10 MG tablet Take 1 tablet (10 mg total) by mouth daily.  . magnesium oxide (MAG-OX) 400 MG tablet Take 400 mg by mouth at bedtime.   . montelukast (SINGULAIR) 10 MG tablet Take 1 tablet (10 mg total) by mouth daily.  Marland Kitchen omeprazole (PRILOSEC) 40 MG capsule TAKE 1 CAPSULE BY MOUTH EVERY DAY  . polyethylene glycol powder (GLYCOLAX/MIRALAX) 17 GM/SCOOP powder Take 17 g by mouth every evening.  . triamcinolone cream  (KENALOG) 0.1 % Apply 1 application topically 2 (two) times daily as needed. For skin breakdown  . vitamin B-12 (CYANOCOBALAMIN) 1000 MCG tablet Take 1,000 mcg by mouth daily.  Marland Kitchen zolpidem (AMBIEN) 5 MG tablet Take 1/2 to 1 tablets po QHS prn insomnia  . [DISCONTINUED] FLOVENT HFA 110 MCG/ACT inhaler TAKE 1 PUFF BY MOUTH TWICE A DAY  . [DISCONTINUED] HYDROcodone-Acetaminophen 5-300 MG TABS Take 1 tablet by mouth 2 (two) times daily as needed.  . [DISCONTINUED] ipratropium (ATROVENT HFA) 17 MCG/ACT inhaler INHALE 2 PUFFS 4 TIMES DAILY  . fluticasone (FLOVENT HFA) 110 MCG/ACT inhaler Use 2 puffs inhaled twice dilay  . HYDROcodone-Acetaminophen 5-300 MG TABS Take 1 tablet by mouth 2 (two) times daily as needed.  Marland Kitchen ipratropium (ATROVENT HFA) 17 MCG/ACT inhaler INHALE 2 PUFFS 4 TIMES DAILY   No facility-administered encounter medications on file as of 11/05/2020.    Surgical History: Past Surgical History:  Procedure Laterality Date  . BUNIONECTOMY    . CATARACT EXTRACTION W/PHACO Right 10/11/2016   Procedure: CATARACT EXTRACTION PHACO AND INTRAOCULAR LENS PLACEMENT (IOC);  Surgeon: Birder Robson, MD;  Location: [facility] ORS;  Service: Ophthalmology;  Laterality: Right;  Lot# [id number] H [place]: 00:37.7 AP%: 18.1 CDE: 6.80  . CATARACT EXTRACTION W/PHACO Left 11/08/2016   Procedure: CATARACT EXTRACTION PHACO AND INTRAOCULAR LENS PLACEMENT (IOC);  Surgeon: Birder Robson, MD;  Location: [facility] ORS;  Service: Ophthalmology;  Laterality: Left;  PACK LOT: [id number] H US:00:32 AP:44 CDE:6.46  . COLONOSCOPY    . COLONOSCOPY WITH PROPOFOL N/A 11/26/2015   Procedure: COLONOSCOPY WITH PROPOFOL;  Surgeon: Manya Silvas, MD;  Location: [facility] ENDOSCOPY;  Service: Endoscopy;  Laterality: N/A;  . EYE SURGERY    . TONSILLECTOMY      Medical History: Past Medical History:  Diagnosis Date  . Allergic rhinitis   . Anxiety   . Asthma   . Blood transfusion without reported diagnosis   . Depression   .  Diverticulosis   . Dyspnea   . Esophagitis   . GERD (gastroesophageal reflux disease)   . Headache   . Heart murmur   . Hyperlipemia   . Hypertension   . Hypothyroidism   . Obesity   . Osteopenia   . Palpitations   . Pneumothorax   . Sleep apnea     Family History: Family History  Problem Relation Age of Onset  . Breast cancer Maternal Grandmother   . Stroke Maternal Grandmother   . Bladder Cancer Father   . Prostate cancer Father   . Heart attack Father   . Aortic aneurysm Father   . Congestive Heart Failure Father   . Colon cancer Paternal Grandmother   . Aneurysm Paternal Grandmother   . Stroke Paternal Grandmother   . Congestive Heart Failure Paternal Grandmother   . Dementia Mother   . Stroke Mother   . Dementia Maternal Grandfather   . Stroke Maternal Grandfather   . Dementia Paternal Grandfather   . Stroke Paternal Grandfather  Review of Systems  Constitutional: Positive for fatigue. Negative for activity change, chills and unexpected weight change.  HENT: Negative for congestion, postnasal drip, rhinorrhea, sneezing, sore throat and voice change.   Respiratory: Positive for cough. Negative for chest tightness, shortness of breath and wheezing.        Intermittent. Well managed with inhalers.   Cardiovascular: Negative for chest pain and palpitations.  Gastrointestinal: Negative for abdominal pain, constipation, diarrhea, nausea and vomiting.  Endocrine: Negative for cold intolerance, heat intolerance, polydipsia and polyuria.  Genitourinary: Positive for frequency and urgency.       Patient has pessary managed per GYN  Musculoskeletal: Positive for arthralgias, back pain and joint swelling. Negative for neck pain.       Intermittent lower back pain.  Having more severe pain since recent fall.   Skin: Negative for rash.  Allergic/Immunologic: Positive for environmental allergies.  Neurological: Negative for dizziness, tremors, numbness and headaches.   Hematological: Negative for adenopathy. Does not bruise/bleed easily.  Psychiatric/Behavioral: Positive for dysphoric mood. Negative for behavioral problems (Depression), sleep disturbance and suicidal ideas. The patient is nervous/anxious.      Today's Vitals   11/05/20 1358  BP: 132/78  Pulse: 87  Resp: 16  Temp: (!) 97.4 F (36.3 C)  SpO2: 96%  Weight: 149 lb 12.8 oz (67.9 kg)  Height: 5\' 1"  (1.549 m)   Body mass index is 28.3 kg/m.  Physical Exam Vitals and nursing note reviewed.  Constitutional:      General: She is not in acute distress.    Appearance: Normal appearance. She is well-developed. She is not diaphoretic.  HENT:     Head: Normocephalic and atraumatic.     Nose: Nose normal.     Mouth/Throat:     Pharynx: No oropharyngeal exudate.  Eyes:     Pupils: Pupils are equal, round, and reactive to light.  Neck:     Thyroid: No thyromegaly.     Vascular: No carotid bruit or JVD.     Trachea: No tracheal deviation.  Cardiovascular:     Rate and Rhythm: Normal rate and regular rhythm.     Pulses: Normal pulses.     Heart sounds: Normal heart sounds. No murmur heard. No friction rub. No gallop.   Pulmonary:     Effort: Pulmonary effort is normal. No respiratory distress.     Breath sounds: Normal breath sounds. No wheezing or rales.  Chest:     Chest wall: No tenderness.  Abdominal:     General: Bowel sounds are normal.     Palpations: Abdomen is soft.     Tenderness: There is no abdominal tenderness.  Musculoskeletal:        General: Normal range of motion.     Cervical back: Normal range of motion and neck supple.     Comments: Moderate lower back pain. This is more severe with bending and twisting at the waist. She has some left knee pain with swelling and some bruising present. She does have full ROM and strength. Walking with slight limp favoring the left side.   Lymphadenopathy:     Cervical: No cervical adenopathy.  Skin:    General: Skin is warm  and dry.  Neurological:     General: No focal deficit present.     Mental Status: She is alert and oriented to person, place, and time. Mental status is at baseline.     Cranial Nerves: No cranial nerve deficit.  Psychiatric:  Attention and Perception: Attention and perception normal.        Mood and Affect: Affect normal. Mood is anxious. Mood is not depressed.        Speech: Speech normal.        Behavior: Behavior normal. Behavior is cooperative.        Thought Content: Thought content normal.        Cognition and Memory: Cognition and memory normal.        Judgment: Judgment normal.    Depression screen [facility] 2/9 11/05/2020 08/20/2020 06/16/2020 05/07/2020 02/04/2020  Decreased Interest 0 0 0 0 0  Down, Depressed, Hopeless 0 0 0 0 0  PHQ - 2 Score 0 0 0 0 0  Altered sleeping - - - - -  Tired, decreased energy - - - - -  Change in appetite - - - - -  Feeling bad or failure about yourself  - - - - -  Trouble concentrating - - - - -  Moving slowly or fidgety/restless - - - - -  Suicidal thoughts - - - - -  PHQ-9 Score - - - - -    Functional Status Survey: Is the patient deaf or have difficulty hearing?: Yes Does the patient have difficulty seeing, even when wearing glasses/contacts?: Yes Does the patient have difficulty concentrating, remembering, or making decisions?: No Does the patient have difficulty walking or climbing stairs?: No Does the patient have difficulty dressing or bathing?: No Does the patient have difficulty doing errands alone such as visiting a doctor's office or shopping?: No  MMSE - [place] Exam 11/05/2020 11/05/2019 10/30/2018  Orientation to time 5 5 5   Orientation to Place 5 5 5   Registration 3 3 3   Attention/ Calculation 5 5 5   Recall 3 3 3   Language- name 2 objects 2 2 2   Language- repeat 1 1 1   Language- follow 3 step command 3 3 3   Language- read & follow direction 1 1 1   Write a sentence 1 1 1   Copy design 1 1 1   Total score 30  30 30     Fall Risk  11/05/2020 08/20/2020 06/16/2020 05/07/2020 02/04/2020  Falls in the past year? 1 0 0 0 0  Number falls in past yr: 0 - - - -  Injury with Fall? 1 - - - -  Comment hurt right knee and elbow - - - -  Follow up Falls evaluation completed - - - -      LABS: Recent Results (from the past 2160 hour(s))  UA/M w/rflx Culture, Routine     Status: Abnormal   Collection Time: 11/05/20  2:03 PM   Specimen: Urine   Urine  Result Value Ref Range   Specific Gravity, UA 1.010 1.005 - 1.030   pH, UA 7.5 5.0 - 7.5   Color, UA Yellow Yellow   Appearance Ur Clear Clear   Leukocytes,UA Trace (A) Negative   Protein,UA Negative Negative/Trace   Glucose, UA Negative Negative   Ketones, UA Negative Negative   RBC, UA Negative Negative   Bilirubin, UA Negative Negative   Urobilinogen, Ur 0.2 0.2 - 1.0 mg/dL   Nitrite, UA Negative Negative   Microscopic Examination See below:     Comment: Microscopic was indicated and was performed.   Urinalysis Reflex Comment     Comment: This specimen has reflexed to a Urine Culture.  Microscopic Examination     Status: None   Collection Time: 11/05/20  2:03 PM  Urine  Result Value Ref Range   WBC, UA None seen 0 - 5 /hpf   RBC None seen 0 - 2 /hpf   Epithelial Cells (non renal) 0-10 0 - 10 /hpf   Casts None seen None seen /lpf   Bacteria, UA None seen None seen/Few  Urine Culture, Reflex     Status: Abnormal   Collection Time: 11/05/20  2:03 PM   Urine  Result Value Ref Range   Urine Culture, Routine CANCELED (A)     Comment: Test not performed. Specimen not received at refrigerated temperature.  Result canceled by the ancillary.     Assessment/Plan: 1. Encounter for general adult medical examination with abnormal findings Annual health maintenance exam today.   2. Moderate persistent asthma without status asthmaticus without complication Stable. Continue flovent twice daily. Use rescue inhaler as needed and as prescribed -  fluticasone (FLOVENT HFA) 110 MCG/ACT inhaler; Use 2 puffs inhaled twice dilay  Dispense: 12 each; Refill: 3 - ipratropium (ATROVENT HFA) 17 MCG/ACT inhaler; INHALE 2 PUFFS 4 TIMES DAILY  Dispense: 1 each; Refill: 3  3. Seasonal allergic rhinitis due to pollen Continue all allergy medications as prescribed   4. Essential hypertension, benign Stable. conitnue bp medication as prescribed   5. Chronic midline low back pain without sciatica May conitnue to take hydrocodone/APAP 5/300mg  up to twice daily when needed for severe pain. New prescription sent to her pharmacy today.  - HYDROcodone-Acetaminophen 5-300 MG TABS; Take 1 tablet by mouth 2 (two) times daily as needed.  Dispense: 20 tablet; Refill: 0  6. Dysuria - UA/M w/rflx Culture, Routine  7. Encounter for screening mammogram for malignant neoplasm of breast - MM DIGITAL SCREENING BILATERAL; Future  General Counseling: yuridiana formanek understanding of the findings of todays visit and agrees with plan of treatment. I have discussed any further diagnostic evaluation that may be needed or ordered today. We also reviewed her medications today. she has been encouraged to call the office with any questions or concerns that should arise related to todays visit.    Counseling:  This patient was seen by Leretha Pol FNP Collaboration with Dr Lavera Guise as a part of collaborative care agreement  Orders Placed This Encounter  Procedures  . Microscopic Examination  . Urine Culture, Reflex  . MM DIGITAL SCREENING BILATERAL  . UA/M w/rflx Culture, Routine    Meds ordered this encounter  Medications  . fluticasone (FLOVENT HFA) 110 MCG/ACT inhaler    Sig: Use 2 puffs inhaled twice dilay    Dispense:  12 each    Refill:  3    Order Specific Question:   Supervising Provider    Answer:   Lavera Guise [[id number]]  . ipratropium (ATROVENT HFA) 17 MCG/ACT inhaler    Sig: INHALE 2 PUFFS 4 TIMES DAILY    Dispense:  1 each    Refill:  3     Order Specific Question:   Supervising Provider    Answer:   Lavera Guise [place]  . HYDROcodone-Acetaminophen 5-300 MG TABS    Sig: Take 1 tablet by mouth 2 (two) times daily as needed.    Dispense:  20 tablet    Refill:  0    Order Specific Question:   Supervising Provider    Answer:   Lavera Guise [[id number]]    Total time spent: 15 Minutes  Time spent includes review of chart, medications, test results, and follow up plan with the patient.  Lavera Guise, MD  Internal Medicine

## 2020-11-07 LAB — UA/M W/RFLX CULTURE, ROUTINE
Bilirubin, UA: NEGATIVE
Glucose, UA: NEGATIVE
Ketones, UA: NEGATIVE
Nitrite, UA: NEGATIVE
Protein,UA: NEGATIVE
RBC, UA: NEGATIVE
Specific Gravity, UA: 1.01 (ref 1.005–1.030)
Urobilinogen, Ur: 0.2 mg/dL (ref 0.2–1.0)
pH, UA: 7.5 (ref 5.0–7.5)

## 2020-11-07 LAB — MICROSCOPIC EXAMINATION
Bacteria, UA: NONE SEEN
Casts: NONE SEEN /lpf
RBC, Urine: NONE SEEN /hpf (ref 0–2)
WBC, UA: NONE SEEN /hpf (ref 0–5)

## 2020-11-07 LAB — URINE CULTURE, REFLEX

## 2020-12-06 IMAGING — CR DG CHEST 2V
1 series · 2 of 2 positions shown · non-contrast
Comparison: 01/08/2018

CLINICAL DATA: Cough

EXAM:
CHEST - 2 VIEW

[Series 1: dg chest 2 view · 0.14mm/px · 2 of 2 slices shown]
[im 1/2]
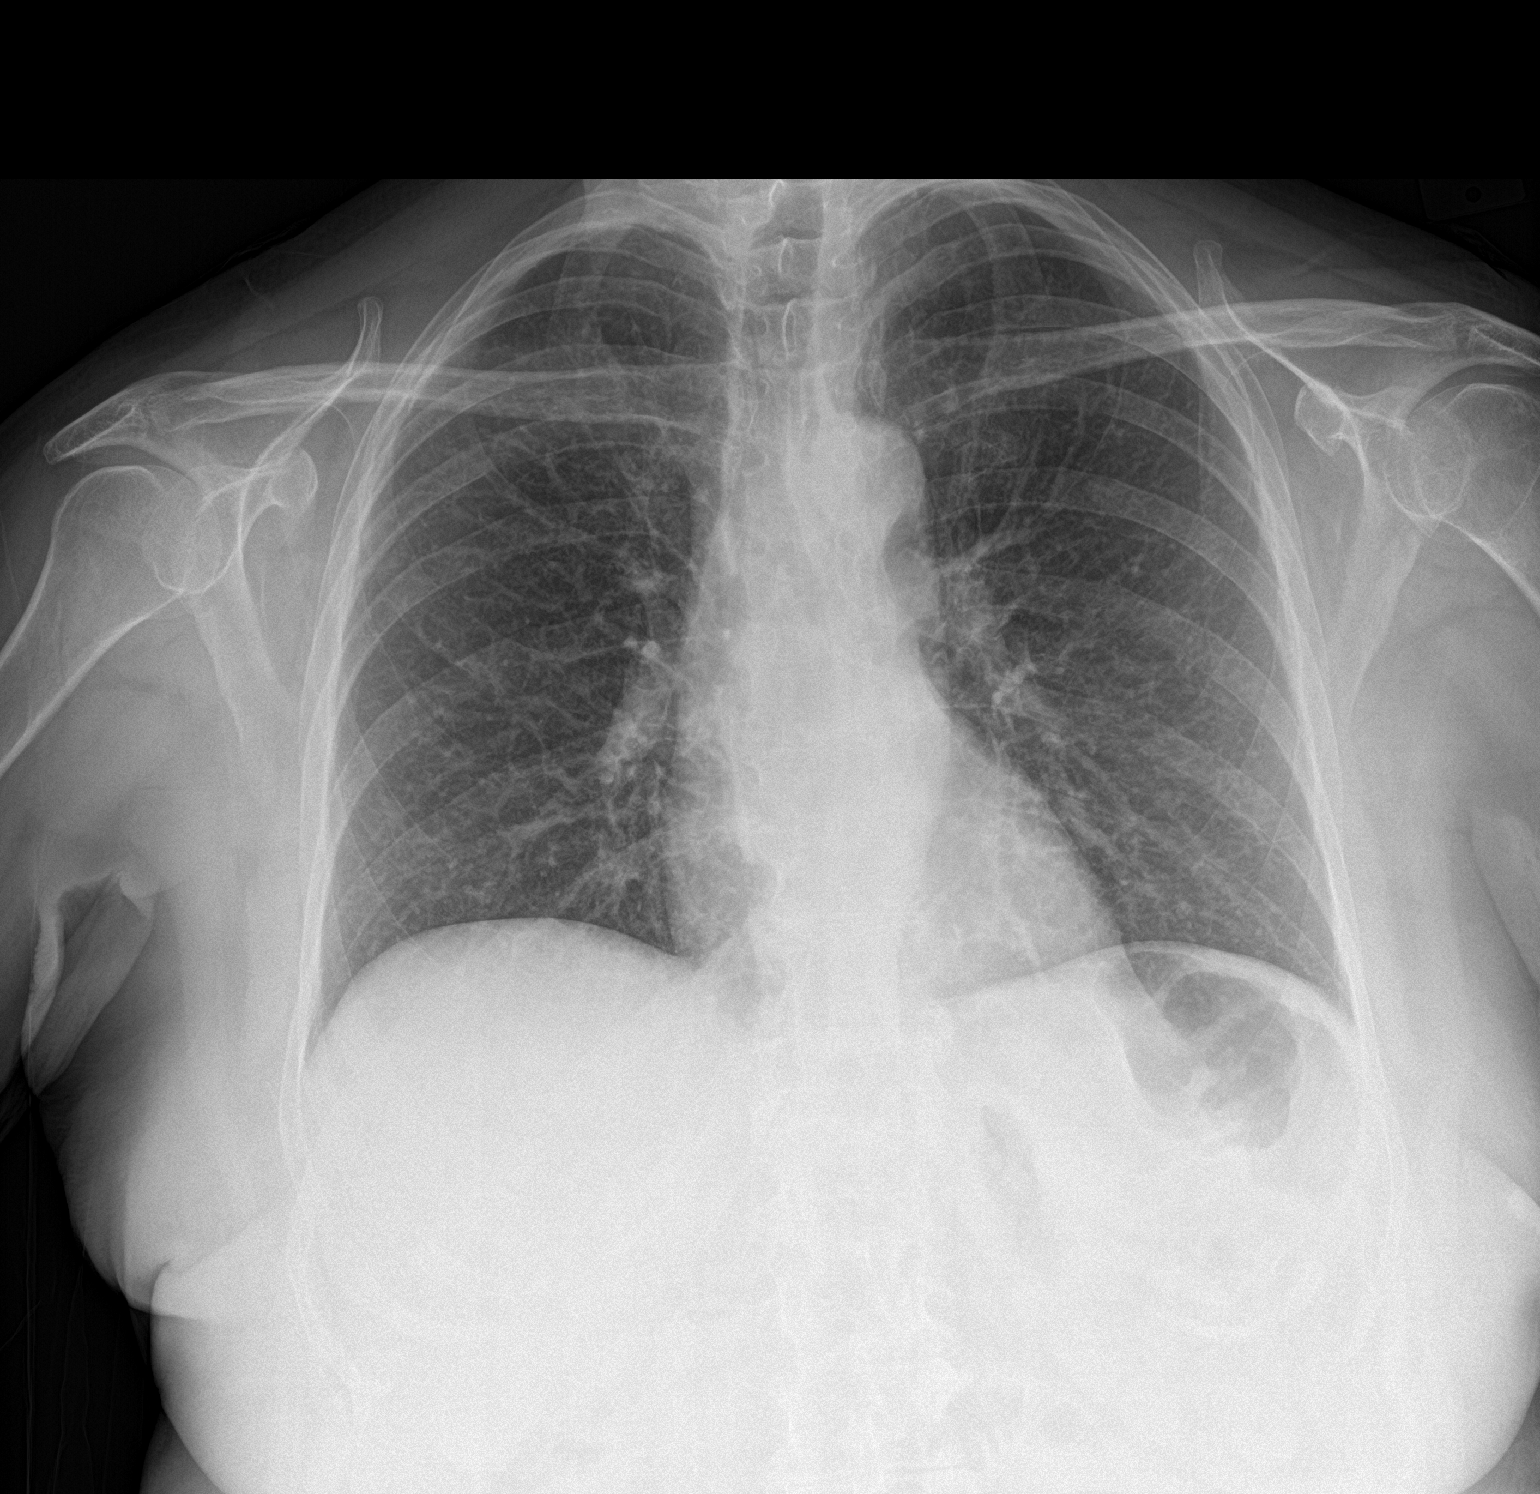
[im 2/2]
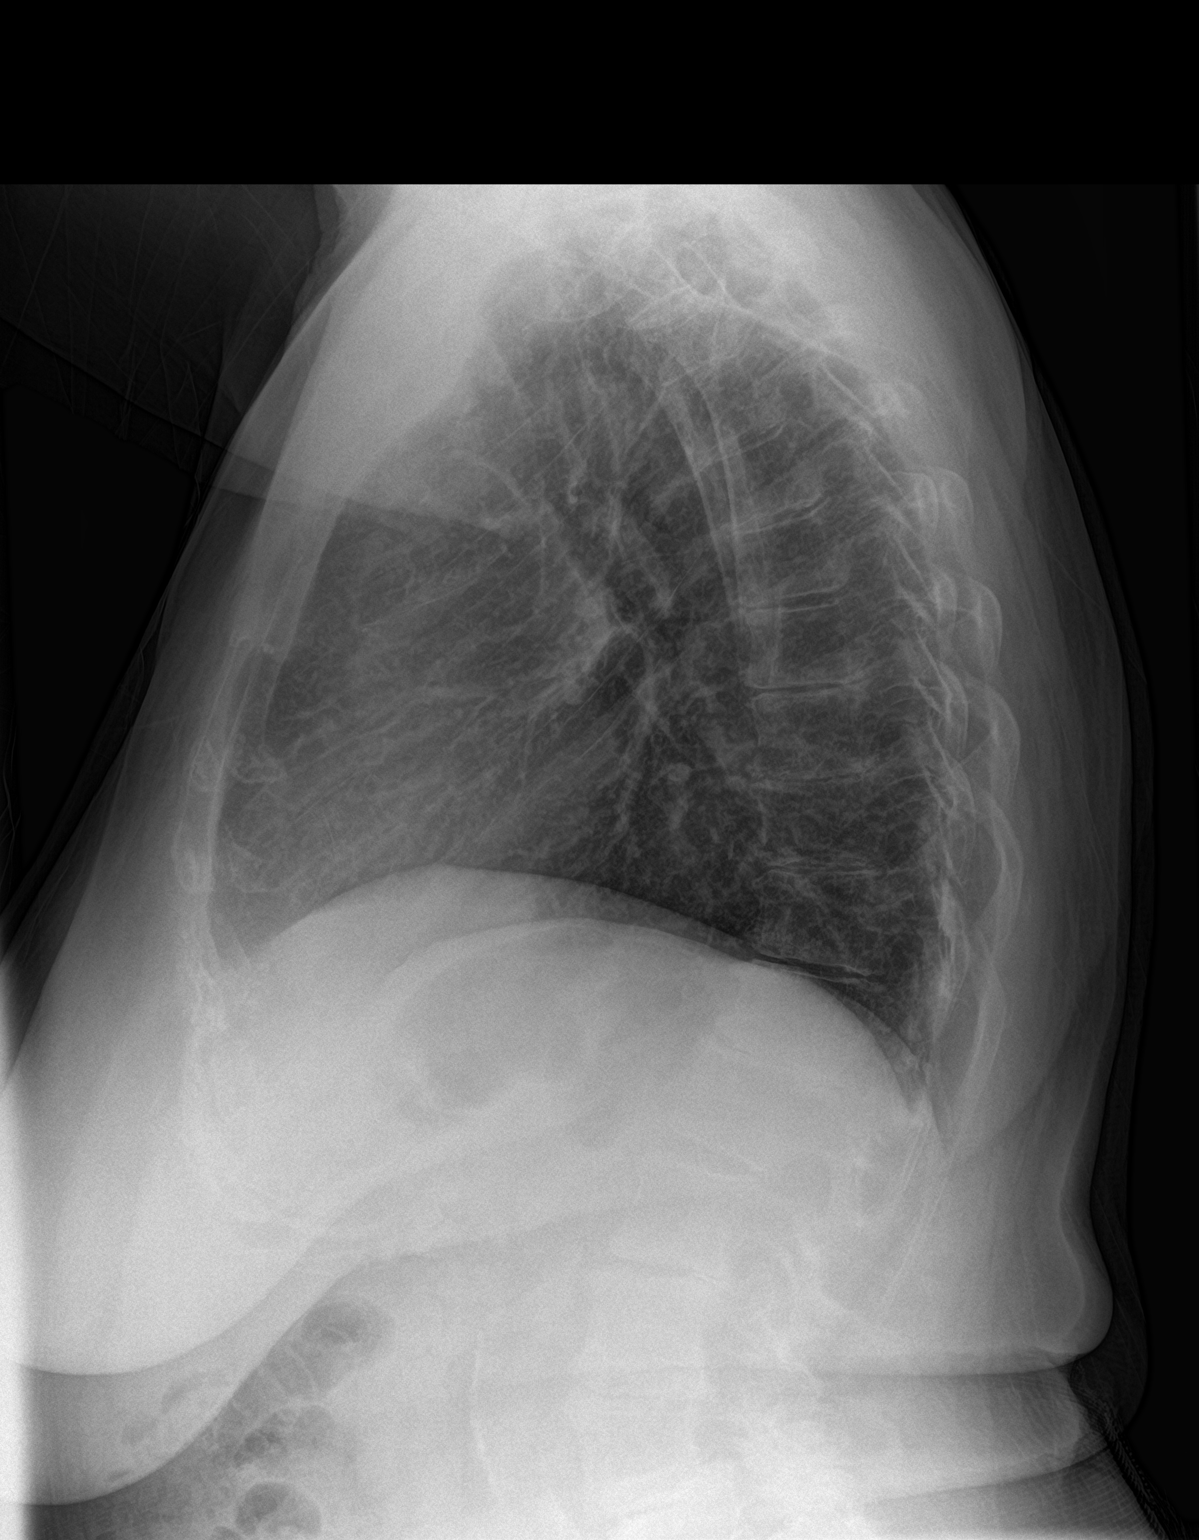

[2 of 2 positions shown; findings below may reference images not displayed]

FINDINGS: Normal heart size. Lungs clear. No pneumothorax. No pleural
effusion.
IMPRESSION: No active cardiopulmonary disease.

## 2020-12-17 ENCOUNTER — Other Ambulatory Visit: Payer: Self-pay

## 2020-12-17 MED ORDER — LEVOTHYROXINE SODIUM 100 MCG PO TABS: ORAL_TABLET | ORAL | 1 refills | Status: DC

## 2020-12-25 DIAGNOSIS — Z23 Encounter for immunization: Secondary | ICD-10-CM | POA: Diagnosis not present

## 2021-01-12 ENCOUNTER — Other Ambulatory Visit: Payer: Self-pay | Admitting: Nurse Practitioner

## 2021-02-03 ENCOUNTER — Other Ambulatory Visit: Payer: Self-pay

## 2021-02-03 ENCOUNTER — Ambulatory Visit (INDEPENDENT_AMBULATORY_CARE_PROVIDER_SITE_OTHER): Payer: Medicare Other | Admitting: Hospice and Palliative Medicine

## 2021-02-03 ENCOUNTER — Encounter: Payer: Self-pay | Admitting: Hospice and Palliative Medicine

## 2021-02-03 VITALS — BP 138/80 | HR 76 | Temp 97.3°F | Resp 16 | Ht 61.0 in | Wt 148.2 lb

## 2021-02-03 DIAGNOSIS — H532 Diplopia: Secondary | ICD-10-CM

## 2021-02-03 DIAGNOSIS — Z1231 Encounter for screening mammogram for malignant neoplasm of breast: Secondary | ICD-10-CM

## 2021-02-03 DIAGNOSIS — E039 Hypothyroidism, unspecified: Secondary | ICD-10-CM

## 2021-02-03 DIAGNOSIS — R42 Dizziness and giddiness: Secondary | ICD-10-CM

## 2021-02-03 DIAGNOSIS — F411 Generalized anxiety disorder: Secondary | ICD-10-CM

## 2021-02-03 DIAGNOSIS — G44019 Episodic cluster headache, not intractable: Secondary | ICD-10-CM | POA: Diagnosis not present

## 2021-02-03 DIAGNOSIS — F5101 Primary insomnia: Secondary | ICD-10-CM

## 2021-02-03 DIAGNOSIS — R5383 Other fatigue: Secondary | ICD-10-CM | POA: Diagnosis not present

## 2021-02-03 DIAGNOSIS — E782 Mixed hyperlipidemia: Secondary | ICD-10-CM

## 2021-02-03 MED ORDER — FLUOXETINE HCL 40 MG PO CAPS: 40.0000 mg | ORAL_CAPSULE | Freq: Every day | ORAL | 1 refills | Status: DC

## 2021-02-03 MED ORDER — FENOFIBRATE 145 MG PO TABS: 145.0000 mg | ORAL_TABLET | Freq: Every day | ORAL | 1 refills | Status: DC

## 2021-02-03 MED ORDER — ALPRAZOLAM 0.25 MG PO TABS: 0.2500 mg | ORAL_TABLET | Freq: Two times a day (BID) | ORAL | 1 refills | Status: DC | PRN

## 2021-02-03 MED ORDER — MIRTAZAPINE 7.5 MG PO TABS: 7.5000 mg | ORAL_TABLET | Freq: Every day | ORAL | 0 refills | Status: DC

## 2021-02-03 MED ORDER — LEVOTHYROXINE SODIUM 100 MCG PO TABS: ORAL_TABLET | ORAL | 1 refills | Status: DC

## 2021-02-03 NOTE — Progress Notes (Signed)
[facility] [place], [place] [address]  Internal MEDICINE  Office Visit Note  Patient Name: Bonnie West  [id number]  [id number]  Date of Service: 02/05/2021  Chief Complaint  Patient presents with  . Follow-up    Refill request, cluster migraines started about a month ago, vertigo started this week  . Gastroesophageal Reflux  . Sleep Apnea  . Hypertension  . Hyperlipidemia  . Anxiety  . Asthma    HPI  Patient is here for routine follow-up C/o ongoing headaches, described as cluster headaches above right eye, has been ongoing for a month with daily headaches Also complains of visual disturbances in her eyes, blurred and double vision, has related this to needing cataract surgery--scheduled to see her eye doctor next month Developed dizziness and vertigo symptoms last week  C/o insomnia--has been prescribed ambien in the past but does not feel this is helpful at controlling her symptoms   OSA--unable to tolerate CPAP, not wanting to retry at this time Due for screening mammogram  Current Medication: Outpatient Encounter Medications as of 02/03/2021  Medication Sig  . mirtazapine (REMERON) 7.5 MG tablet Take 1 tablet (7.5 mg total) by mouth at bedtime.  Marland Kitchen acetaminophen (TYLENOL) 500 MG tablet Take 1,000 mg by mouth 2 (two) times daily as needed for mild pain or moderate pain.  Marland Kitchen albuterol (PROVENTIL) (2.5 MG/3ML) 0.083% nebulizer solution Take 3 mLs (2.5 mg total) by nebulization every 6 (six) hours as needed for wheezing or shortness of breath.  Marland Kitchen albuterol (VENTOLIN HFA) 108 (90 Base) MCG/ACT inhaler INHALE 2 PUFFS BY MOUTH EVERY 6 HOURS  . ALPRAZolam (XANAX) 0.25 MG tablet Take 1 tablet (0.25 mg total) by mouth 2 (two) times daily as needed for anxiety.  . bisacodyl (DULCOLAX) 5 MG EC tablet Take 5-10 mg by mouth at bedtime as needed for moderate constipation. Two at night  . cetirizine (ZYRTEC) 10 MG tablet Take 10 mg by mouth daily.  .  cholecalciferol (VITAMIN D) 1000 units tablet Take 1,000 Units by mouth at bedtime.  . clindamycin (CLEOCIN T) 1 % external solution Apply 1 application topically 2 (two) times daily as needed. For skin breakdown  . cyclobenzaprine (FLEXERIL) 5 MG tablet Take 1-2 tablets (5-10 mg total) by mouth 2 (two) times daily as needed for muscle spasms.  Marland Kitchen docusate sodium (COLACE) 100 MG capsule Take 100-200 mg by mouth at bedtime as needed for mild constipation or moderate constipation.   Marland Kitchen estradiol (ESTRACE) 0.1 MG/GM vaginal cream INSERT 1 GRAM VAGINALLY TWICE WEEKLY AT BEDTIME  . fenofibrate (TRICOR) 145 MG tablet Take 1 tablet (145 mg total) by mouth daily.  Marland Kitchen FLUoxetine (PROZAC) 40 MG capsule Take 1 capsule (40 mg total) by mouth daily.  . fluticasone (FLOVENT HFA) 110 MCG/ACT inhaler Use 2 puffs inhaled twice dilay  . HYDROcodone-Acetaminophen 5-300 MG TABS Take 1 tablet by mouth 2 (two) times daily as needed.  Marland Kitchen ipratropium (ATROVENT HFA) 17 MCG/ACT inhaler INHALE 2 PUFFS 4 TIMES DAILY  . ipratropium (ATROVENT) 0.02 % nebulizer solution Use 1 vial nebulized QID prn shortness of breath/wheezing.  . isometheptene-acetaminophen-dichloralphenazone (MIDRIN) 65-100-325 MG capsule Take 1 capsule by mouth 4 (four) times daily as needed for migraine. Maximum 5 capsules in 12 hours for migraine headaches, 8 capsules in 24 hours for tension headaches.  . levothyroxine (SYNTHROID) 100 MCG tablet TAKE 1 TABLET BY MOUTH EVERY DAY IN THE MORNING  . lisinopril (ZESTRIL) 10 MG tablet TAKE 1 TABLET BY MOUTH EVERY  DAY  . magnesium oxide (MAG-OX) 400 MG tablet Take 400 mg by mouth at bedtime.   . montelukast (SINGULAIR) 10 MG tablet Take 1 tablet (10 mg total) by mouth daily.  Marland Kitchen omeprazole (PRILOSEC) 40 MG capsule TAKE 1 CAPSULE BY MOUTH EVERY DAY  . polyethylene glycol powder (GLYCOLAX/MIRALAX) 17 GM/SCOOP powder Take 17 g by mouth every evening.  . triamcinolone cream (KENALOG) 0.1 % Apply 1 application topically 2  (two) times daily as needed. For skin breakdown  . vitamin B-12 (CYANOCOBALAMIN) 1000 MCG tablet Take 1,000 mcg by mouth daily.  Marland Kitchen zolpidem (AMBIEN) 5 MG tablet Take 1/2 to 1 tablets po QHS prn insomnia  . [DISCONTINUED] ALPRAZolam (XANAX) 0.25 MG tablet Take 1 tablet (0.25 mg total) by mouth 2 (two) times daily as needed for anxiety.  . [DISCONTINUED] fenofibrate (TRICOR) 145 MG tablet TAKE 1 TABLET BY MOUTH EVERY DAY  . [DISCONTINUED] FLUoxetine (PROZAC) 40 MG capsule TAKE 1 CAPSULE BY MOUTH EVERY DAY  . [DISCONTINUED] levothyroxine (SYNTHROID) 100 MCG tablet TAKE 1 TABLET BY MOUTH EVERY DAY IN THE MORNING   No facility-administered encounter medications on file as of 02/03/2021.    Surgical History: Past Surgical History:  Procedure Laterality Date  . BUNIONECTOMY    . CATARACT EXTRACTION W/PHACO Right 10/11/2016   Procedure: CATARACT EXTRACTION PHACO AND INTRAOCULAR LENS PLACEMENT (IOC);  Surgeon: Birder Robson, MD;  Location: [facility] ORS;  Service: Ophthalmology;  Laterality: Right;  Lot# [id number] H [place]: 00:37.7 AP%: 18.1 CDE: 6.80  . CATARACT EXTRACTION W/PHACO Left 11/08/2016   Procedure: CATARACT EXTRACTION PHACO AND INTRAOCULAR LENS PLACEMENT (IOC);  Surgeon: Birder Robson, MD;  Location: [facility] ORS;  Service: Ophthalmology;  Laterality: Left;  PACK LOT: [id number] H US:00:32 AP:44 CDE:6.46  . COLONOSCOPY    . COLONOSCOPY WITH PROPOFOL N/A 11/26/2015   Procedure: COLONOSCOPY WITH PROPOFOL;  Surgeon: Manya Silvas, MD;  Location: [facility] ENDOSCOPY;  Service: Endoscopy;  Laterality: N/A;  . EYE SURGERY    . TONSILLECTOMY      Medical History: Past Medical History:  Diagnosis Date  . Allergic rhinitis   . Anxiety   . Asthma   . Blood transfusion without reported diagnosis   . Depression   . Diverticulosis   . Dyspnea   . Esophagitis   . GERD (gastroesophageal reflux disease)   . Headache   . Heart murmur   . Hyperlipemia   . Hypertension   . Hypothyroidism   . Obesity    . Osteopenia   . Palpitations   . Pneumothorax   . Sleep apnea     Family History: Family History  Problem Relation Age of Onset  . Breast cancer Maternal Grandmother   . Stroke Maternal Grandmother   . Bladder Cancer Father   . Prostate cancer Father   . Heart attack Father   . Aortic aneurysm Father   . Congestive Heart Failure Father   . Colon cancer Paternal Grandmother   . Aneurysm Paternal Grandmother   . Stroke Paternal Grandmother   . Congestive Heart Failure Paternal Grandmother   . Dementia Mother   . Stroke Mother   . Dementia Maternal Grandfather   . Stroke Maternal Grandfather   . Dementia Paternal Grandfather   . Stroke Paternal Grandfather     Social History   Socioeconomic History  . Marital status: Married    Spouse name: Not on file  . Number of children: Not on file  . Years of education: Not on file  . Highest education level:  Not on file  Occupational History  . Not on file  Tobacco Use  . Smoking status: Former Research scientist (life sciences)  . Smokeless tobacco: Never Used  Vaping Use  . Vaping Use: Never used  Substance and Sexual Activity  . Alcohol use: No  . Drug use: No  . Sexual activity: Not Currently    Partners: Male    Birth control/protection: Post-menopausal  Other Topics Concern  . Not on file  Social History Narrative  . Not on file   Social Determinants of Health   Financial Resource Strain: Not on file  Food Insecurity: Not on file  Transportation Needs: Not on file  Physical Activity: Not on file  Stress: Not on file  Social Connections: Not on file  Intimate Partner Violence: Not on file      Review of Systems  Constitutional: Negative for chills, diaphoresis and fatigue.  HENT: Negative for ear pain, postnasal drip and sinus pressure.   Eyes: Positive for visual disturbance. Negative for photophobia, discharge, redness and itching.  Respiratory: Negative for cough, shortness of breath and wheezing.   Cardiovascular: Negative  for chest pain, palpitations and leg swelling.  Gastrointestinal: Negative for abdominal pain, constipation, diarrhea, nausea and vomiting.  Genitourinary: Negative for dysuria and flank pain.  Musculoskeletal: Negative for arthralgias, back pain, gait problem and neck pain.  Skin: Negative for color change.  Allergic/Immunologic: Negative for environmental allergies and food allergies.  Neurological: Positive for dizziness and headaches.  Hematological: Does not bruise/bleed easily.  Psychiatric/Behavioral: Positive for sleep disturbance. Negative for agitation, behavioral problems (depression) and hallucinations.    Vital Signs: BP 138/80   Pulse 76   Temp (!) 97.3 F (36.3 C)   Resp 16   Ht 5\' 1"  (1.549 m)   Wt 148 lb 3.2 oz (67.2 kg)   SpO2 97%   BMI 28.00 kg/m    Physical Exam Vitals reviewed.  Constitutional:      Appearance: Normal appearance. She is normal weight.  Cardiovascular:     Rate and Rhythm: Normal rate and regular rhythm.     Pulses: Normal pulses.     Heart sounds: Normal heart sounds.  Pulmonary:     Effort: Pulmonary effort is normal.     Breath sounds: Normal breath sounds.  Abdominal:     General: Abdomen is flat.     Palpations: Abdomen is soft.  Musculoskeletal:        General: Normal range of motion.     Cervical back: Normal range of motion.  Skin:    General: Skin is warm.  Neurological:     General: No focal deficit present.     Mental Status: She is alert and oriented to person, place, and time. Mental status is at baseline.  Psychiatric:        Mood and Affect: Mood normal.        Behavior: Behavior normal.        Thought Content: Thought content normal.        Judgment: Judgment normal.   Assessment/Plan: 1. Episodic cluster headache, not intractable Daily headaches for 1 month, BP well controlled Dizziness and well as visual disturbances May need referral to neurology - Comprehensive Metabolic Panel (CMET) - CT Head Wo  Contrast; Future  2. Double vision Double vision, daily headaches and dizziness--will review CT Advised to keep appointment with eye doctor for cataract evaluation - CT Head Wo Contrast; Future  3. Encounter for screening mammogram for malignant neoplasm of breast - MM  Digital Screening; Future  4. GAD (generalized anxiety disorder) Symptoms remain well controlled, continue with Prozac and alprazolam as needed [place] Controlled Substance Database was reviewed by me for overdose risk score (ORS) Reviewed risks and possible side effects associated with taking opiates, benzodiazepines and other CNS depressants. Combination of these could cause dizziness and drowsiness. Advised patient not to drive or operate machinery when taking these medications, as patient's and other's life can be at risk and will have consequences. Patient verbalized understanding in this matter. Dependence and abuse for these drugs will be monitored closely. A Controlled substance policy and procedure is on file which allows [facility] to order a urine drug screen test at any visit. Patient understands and agrees with the plan - FLUoxetine (PROZAC) 40 MG capsule; Take 1 capsule (40 mg total) by mouth daily.  Dispense: 90 capsule; Refill: 1 - ALPRAZolam (XANAX) 0.25 MG tablet; Take 1 tablet (0.25 mg total) by mouth 2 (two) times daily as needed for anxiety.  Dispense: 60 tablet; Refill: 1  5. Acquired hypothyroidism Will review updated levels and adjust dosing accordingly as indicated - levothyroxine (SYNTHROID) 100 MCG tablet; TAKE 1 TABLET BY MOUTH EVERY DAY IN THE MORNING  Dispense: 90 tablet; Refill: 1 - TSH + free T4  6. Dizziness New onset of dizziness last week with daily headaches and visual disturbances--will review CT head - CT Head Wo Contrast; Future  7. Primary insomnia Hold Ambien, start Remeron at bedtime to help with insomnia, may need increasing in dose if able to tolerate - mirtazapine  (REMERON) 7.5 MG tablet; Take 1 tablet (7.5 mg total) by mouth at bedtime.  Dispense: 30 tablet; Refill: 0  8. Mixed hyperlipidemia Will review updated levels, continue with Tricor, may need to consider adding statin therapy - fenofibrate (TRICOR) 145 MG tablet; Take 1 tablet (145 mg total) by mouth daily.  Dispense: 90 tablet; Refill: 1 - Lipid Panel With LDL/HDL Ratio  9. Other fatigue - CBC w/Diff/Platelet - Comprehensive Metabolic Panel (CMET) - TSH + free T4 - Lipid Panel With LDL/HDL Ratio  General Counseling: Sinai verbalizes understanding of the findings of todays visit and agrees with plan of treatment. I have discussed any further diagnostic evaluation that may be needed or ordered today. We also reviewed her medications today. she has been encouraged to call the office with any questions or concerns that should arise related to todays visit.    Orders Placed This Encounter  Procedures  . MM Digital Screening  . CT Head Wo Contrast  . CBC w/Diff/Platelet  . Comprehensive Metabolic Panel (CMET)  . TSH + free T4  . Lipid Panel With LDL/HDL Ratio    Meds ordered this encounter  Medications  . fenofibrate (TRICOR) 145 MG tablet    Sig: Take 1 tablet (145 mg total) by mouth daily.    Dispense:  90 tablet    Refill:  1  . FLUoxetine (PROZAC) 40 MG capsule    Sig: Take 1 capsule (40 mg total) by mouth daily.    Dispense:  90 capsule    Refill:  1  . levothyroxine (SYNTHROID) 100 MCG tablet    Sig: TAKE 1 TABLET BY MOUTH EVERY DAY IN THE MORNING    Dispense:  90 tablet    Refill:  1  . ALPRAZolam (XANAX) 0.25 MG tablet    Sig: Take 1 tablet (0.25 mg total) by mouth 2 (two) times daily as needed for anxiety.    Dispense:  60 tablet  Refill:  1  . mirtazapine (REMERON) 7.5 MG tablet    Sig: Take 1 tablet (7.5 mg total) by mouth at bedtime.    Dispense:  30 tablet    Refill:  0    Time spent: 30 Minutes Time spent includes review of chart, medications, test  results and follow-up plan with the patient.  This patient was seen by Theodoro Grist AGNP-C in Collaboration with Dr Lavera Guise as a part of collaborative care agreement     Tanna Furry. Roark Rufo AGNP-C Internal medicine

## 2021-02-05 ENCOUNTER — Encounter: Payer: Self-pay | Admitting: Hospice and Palliative Medicine

## 2021-02-08 ENCOUNTER — Other Ambulatory Visit: Payer: Self-pay | Admitting: Hospice and Palliative Medicine

## 2021-02-08 ENCOUNTER — Ambulatory Visit (INDEPENDENT_AMBULATORY_CARE_PROVIDER_SITE_OTHER): Payer: Medicare Other | Admitting: Internal Medicine

## 2021-02-08 ENCOUNTER — Encounter: Payer: Self-pay | Admitting: Internal Medicine

## 2021-02-08 ENCOUNTER — Other Ambulatory Visit: Payer: Self-pay

## 2021-02-08 VITALS — BP 134/88 | HR 83 | Temp 98.2°F | Resp 16 | Ht 61.0 in | Wt 149.6 lb

## 2021-02-08 DIAGNOSIS — G4733 Obstructive sleep apnea (adult) (pediatric): Secondary | ICD-10-CM | POA: Diagnosis not present

## 2021-02-08 DIAGNOSIS — F5101 Primary insomnia: Secondary | ICD-10-CM

## 2021-02-08 DIAGNOSIS — G43709 Chronic migraine without aura, not intractable, without status migrainosus: Secondary | ICD-10-CM | POA: Diagnosis not present

## 2021-02-08 DIAGNOSIS — J4540 Moderate persistent asthma, uncomplicated: Secondary | ICD-10-CM | POA: Diagnosis not present

## 2021-02-08 DIAGNOSIS — R0602 Shortness of breath: Secondary | ICD-10-CM | POA: Diagnosis not present

## 2021-02-08 NOTE — Progress Notes (Signed)
[facility] [place], [place] [address]  Pulmonary Sleep Medicine   Office Visit Note  Patient Name: Bonnie West DOB: 12/29/1950 MRN [id number]  Date of Service: 02/08/2021  Complaints/HPI: OSA not using CPAP she has been chronically not using her CPAP.  She states that she still does get the migraines for which she uses butalbital for the treatment plan she has been on this medication chronically.  The patient's breathing otherwise is in good shape right now she denied having any specific complaints no chest pain no palpitations were noted.  Has not had pulmonary functions done in quite some time and I did recommend that she get these done at this stage.  ROS  General: (-) fever, (-) chills, (-) night sweats, (-) weakness Skin: (-) rashes, (-) itching,. Eyes: (-) visual changes, (-) redness, (-) itching. Nose and Sinuses: (-) nasal stuffiness or itchiness, (-) postnasal drip, (-) nosebleeds, (-) sinus trouble. Mouth and Throat: (-) sore throat, (-) hoarseness. Neck: (-) swollen glands, (-) enlarged thyroid, (-) neck pain. Respiratory: - cough, (-) bloody sputum, - shortness of breath, - wheezing. Cardiovascular: - ankle swelling, (-) chest pain. Lymphatic: (-) lymph node enlargement. Neurologic: (-) numbness, (-) tingling. Psychiatric: (-) anxiety, (-) depression   Current Medication: Outpatient Encounter Medications as of 02/08/2021  Medication Sig   acetaminophen (TYLENOL) 500 MG tablet Take 1,000 mg by mouth 2 (two) times daily as needed for mild pain or moderate pain.   albuterol (PROVENTIL) (2.5 MG/3ML) 0.083% nebulizer solution Take 3 mLs (2.5 mg total) by nebulization every 6 (six) hours as needed for wheezing or shortness of breath.   albuterol (VENTOLIN HFA) 108 (90 Base) MCG/ACT inhaler INHALE 2 PUFFS BY MOUTH EVERY 6 HOURS   ALPRAZolam (XANAX) 0.25 MG tablet Take 1 tablet (0.25 mg total) by mouth 2 (two) times daily as needed for anxiety.    bisacodyl (DULCOLAX) 5 MG EC tablet Take 5-10 mg by mouth at bedtime as needed for moderate constipation. Two at night   cetirizine (ZYRTEC) 10 MG tablet Take 10 mg by mouth daily.   cholecalciferol (VITAMIN D) 1000 units tablet Take 1,000 Units by mouth at bedtime.   clindamycin (CLEOCIN T) 1 % external solution Apply 1 application topically 2 (two) times daily as needed. For skin breakdown   cyclobenzaprine (FLEXERIL) 5 MG tablet Take 1-2 tablets (5-10 mg total) by mouth 2 (two) times daily as needed for muscle spasms.   docusate sodium (COLACE) 100 MG capsule Take 100-200 mg by mouth at bedtime as needed for mild constipation or moderate constipation.    estradiol (ESTRACE) 0.1 MG/GM vaginal cream INSERT 1 GRAM VAGINALLY TWICE WEEKLY AT BEDTIME   fenofibrate (TRICOR) 145 MG tablet Take 1 tablet (145 mg total) by mouth daily.   FLUoxetine (PROZAC) 40 MG capsule Take 1 capsule (40 mg total) by mouth daily.   fluticasone (FLOVENT HFA) 110 MCG/ACT inhaler Use 2 puffs inhaled twice dilay   HYDROcodone-Acetaminophen 5-300 MG TABS Take 1 tablet by mouth 2 (two) times daily as needed.   ipratropium (ATROVENT HFA) 17 MCG/ACT inhaler INHALE 2 PUFFS 4 TIMES DAILY   ipratropium (ATROVENT) 0.02 % nebulizer solution Use 1 vial nebulized QID prn shortness of breath/wheezing.   isometheptene-acetaminophen-dichloralphenazone (MIDRIN) 65-100-325 MG capsule Take 1 capsule by mouth 4 (four) times daily as needed for migraine. Maximum 5 capsules in 12 hours for migraine headaches, 8 capsules in 24 hours for tension headaches.   levothyroxine (SYNTHROID) 100 MCG tablet TAKE 1  TABLET BY MOUTH EVERY DAY IN THE MORNING   lisinopril (ZESTRIL) 10 MG tablet TAKE 1 TABLET BY MOUTH EVERY DAY   magnesium oxide (MAG-OX) 400 MG tablet Take 400 mg by mouth at bedtime.    mirtazapine (REMERON) 7.5 MG tablet Take 1 tablet (7.5 mg total) by mouth at bedtime.   montelukast (SINGULAIR) 10 MG tablet Take 1 tablet  (10 mg total) by mouth daily.   omeprazole (PRILOSEC) 40 MG capsule TAKE 1 CAPSULE BY MOUTH EVERY DAY   polyethylene glycol powder (GLYCOLAX/MIRALAX) 17 GM/SCOOP powder Take 17 g by mouth every evening.   triamcinolone cream (KENALOG) 0.1 % Apply 1 application topically 2 (two) times daily as needed. For skin breakdown   vitamin B-12 (CYANOCOBALAMIN) 1000 MCG tablet Take 1,000 mcg by mouth daily.   zolpidem (AMBIEN) 5 MG tablet Take 1/2 to 1 tablets po QHS prn insomnia   No facility-administered encounter medications on file as of 02/08/2021.    Surgical History: Past Surgical History:  Procedure Laterality Date   BUNIONECTOMY     CATARACT EXTRACTION W/PHACO Right 10/11/2016   Procedure: CATARACT EXTRACTION PHACO AND INTRAOCULAR LENS PLACEMENT (IOC);  Surgeon: Birder Robson, MD;  Location: [facility] ORS;  Service: Ophthalmology;  Laterality: Right;  Lot# [id number] H [place]: 00:37.7 AP%: 18.1 CDE: 6.80   CATARACT EXTRACTION W/PHACO Left 11/08/2016   Procedure: CATARACT EXTRACTION PHACO AND INTRAOCULAR LENS PLACEMENT ([place]);  Surgeon: Birder Robson, MD;  Location: [facility] ORS;  Service: Ophthalmology;  Laterality: Left;  PACK LOT: [id number] H US:00:32 AP:44 CDE:6.46   COLONOSCOPY     COLONOSCOPY WITH PROPOFOL N/A 11/26/2015   Procedure: COLONOSCOPY WITH PROPOFOL;  Surgeon: Manya Silvas, MD;  Location: [facility] ENDOSCOPY;  Service: Endoscopy;  Laterality: N/A;   EYE SURGERY     TONSILLECTOMY      Medical History: Past Medical History:  Diagnosis Date   Allergic rhinitis    Anxiety    Asthma    Blood transfusion without reported diagnosis    Depression    Diverticulosis    Dyspnea    Esophagitis    GERD (gastroesophageal reflux disease)    Headache    Heart murmur    Hyperlipemia    Hypertension    Hypothyroidism    Obesity    Osteopenia    Palpitations    Pneumothorax    Sleep apnea     Family History: Family History  Problem Relation Age of Onset    Breast cancer Maternal Grandmother    Stroke Maternal Grandmother    Bladder Cancer Father    Prostate cancer Father    Heart attack Father    Aortic aneurysm Father    Congestive Heart Failure Father    Colon cancer Paternal Grandmother    Aneurysm Paternal Grandmother    Stroke Paternal Grandmother    Congestive Heart Failure Paternal Grandmother    Dementia Mother    Stroke Mother    Dementia Maternal Grandfather    Stroke Maternal Grandfather    Dementia Paternal Grandfather    Stroke Paternal Grandfather     Social History: Social History   Socioeconomic History   Marital status: Married    Spouse name: Not on file   Number of children: Not on file   Years of education: Not on file   Highest education level: Not on file  Occupational History   Not on file  Tobacco Use   Smoking status: Former Smoker   Smokeless tobacco: Never Used  Scientific laboratory technician  Use: Never used  Substance and Sexual Activity   Alcohol use: No   Drug use: No   Sexual activity: Not Currently    Partners: Male    Birth control/protection: Post-menopausal  Other Topics Concern   Not on file  Social History Narrative   Not on file   Social Determinants of Health   Financial Resource Strain: Not on file  Food Insecurity: Not on file  Transportation Needs: Not on file  Physical Activity: Not on file  Stress: Not on file  Social Connections: Not on file  Intimate Partner Violence: Not on file    Vital Signs: Blood pressure (!) 154/94, pulse 83, temperature 98.2 F (36.8 C), resp. rate 16, height 5\' 1"  (1.549 m), weight 149 lb 9.6 oz (67.9 kg), SpO2 98 %.  Examination: General Appearance: The patient is well-developed, well-nourished, and in no distress. Skin: Gross inspection of skin unremarkable. Head: normocephalic, no gross deformities. Eyes: no gross deformities noted. ENT: ears appear grossly normal no exudates. Neck: Supple. No thyromegaly.  No LAD. Respiratory: No rhonchi no rales are noted at this time. Cardiovascular: Normal S1 and S2 without murmur or rub. Extremities: No cyanosis. pulses are equal. Neurologic: Alert and oriented. No involuntary movements.  LABS: No results found for this or any previous visit (from the past 2160 hour(s)).  Radiology: No results found.  No results found.  No results found.    Assessment and Plan: Patient Active Problem List   Diagnosis Date Noted   Vaginal atrophy 95/63/8756   Diastolic dysfunction 43/32/9518   OSA on CPAP 07/15/2020   Tightness in chest 05/20/2020   Hypercalcemia 05/20/2020   Primary insomnia 05/20/2020   Acute non-recurrent pansinusitis 02/12/2020   Encounter for general adult medical examination with abnormal findings 11/10/2019   Gastroesophageal reflux disease without esophagitis 11/10/2019   Dysuria 11/10/2019   Acute otitis externa of left ear 07/15/2019   Irritable bowel syndrome with constipation 07/15/2019   Chronic midline low back pain without sciatica 04/27/2019   Allergic rhinitis due to pollen 04/27/2019   Essential hypertension, benign 04/27/2019   GAD (generalized anxiety disorder) 09/01/2018   Chronic migraine without aura without status migrainosus, not intractable 09/01/2018   Encounter for long-term (current) use of medications 09/01/2018   Asthma without status asthmaticus 01/30/2018   Hyperlipidemia, unspecified 01/30/2018   Hypertension 01/30/2018   Obesity, unspecified 01/30/2018   Hypothyroidism 01/30/2018   Acute respiratory failure with hypoxia ([place]) 12/31/2017   Fatty infiltration of liver 12/10/2015   Abdominal pain, LLQ (left lower quadrant) 11/03/2015   History of adenomatous polyp of colon 11/03/2015   Hallux valgus, left 09/18/2014   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   Shoulder arthritis 12/19/2013    1. SOB (shortness of breath) She is at baseline as far as her shortness of  breath is concerned has underlying chronic obstructive asthma no acute flares noted at this time  2. OSA (obstructive sleep apnea) Noncompliant with CPAP patient has not used despite having recommended that she continue to use the machine.  3. Moderate persistent asthma without status asthmaticus without complication Inhalers as necessary she is under good control has not had any admissions to the hospital.  4. Chronic migraine w/o aura w/o status migrainosus, not intractable She will get her medications through her primary care team   General Counseling: I have discussed the findings of the evaluation and examination with Vaughan Basta.  I have also discussed any further diagnostic evaluation thatmay be needed or ordered today. Vaughan Basta  verbalizes understanding of the findings of todays visit. We also reviewed her medications today and discussed drug interactions and side effects including but not limited excessive drowsiness and altered mental states. We also discussed that there is always a risk not just to her but also people around her. she has been encouraged to call the office with any questions or concerns that should arise related to todays visit.  No orders of the defined types were placed in this encounter.    Time spent: 22  I have personally obtained a history, examined the patient, evaluated laboratory and imaging results, formulated the assessment and plan and placed orders.    Allyne Gee, MD [facility] Pulmonary and Critical Care Sleep medicine

## 2021-02-08 NOTE — Patient Instructions (Signed)

## 2021-02-12 ENCOUNTER — Telehealth: Payer: Self-pay

## 2021-02-12 NOTE — Telephone Encounter (Signed)
Called and advised patient of CT scheduled on 03/01/21 at 3:30 pm Arrive at 3:15pm at the [facility]. At time of call patient stated she had a fall on 02/11/21 and now has a big knot on her head. Advised patient to go to ED, refused. Loma Sousa

## 2021-02-17 ENCOUNTER — Telehealth: Payer: Self-pay | Admitting: Internal Medicine

## 2021-02-17 DIAGNOSIS — H43813 Vitreous degeneration, bilateral: Secondary | ICD-10-CM | POA: Diagnosis not present

## 2021-02-17 NOTE — Progress Notes (Signed)
  Chronic Care Management   Outreach Note  02/17/2021 Name: Bonnie West MRN: [id number] DOB: 1951-03-12  Referred by: Lavera Guise, MD Reason for referral : No chief complaint on file.   An unsuccessful telephone outreach was attempted today. The patient was referred to the pharmacist for assistance with care management and care coordination.   Follow Up Plan:   Carley Perdue [employer] Scheduler

## 2021-02-19 ENCOUNTER — Telehealth: Payer: Self-pay | Admitting: Internal Medicine

## 2021-02-19 DIAGNOSIS — E039 Hypothyroidism, unspecified: Secondary | ICD-10-CM | POA: Diagnosis not present

## 2021-02-19 DIAGNOSIS — R5383 Other fatigue: Secondary | ICD-10-CM | POA: Diagnosis not present

## 2021-02-19 NOTE — Progress Notes (Signed)
  Chronic Care Management   Note  02/19/2021 Name: Bonnie West MRN: [id number] DOB: November 28, 1950  Bonnie West is a 71 y.o. year old female who is a primary care patient of Lavera Guise, MD. I reached out to Loreli Dollar by phone today in response to a referral sent by Bonnie West's PCP, Lavera Guise, MD.   Bonnie West was given information about Chronic Care Management services today including:  1. CCM service includes personalized support from designated clinical staff supervised by her physician, including individualized plan of care and coordination with other care providers 2. 24/7 contact phone numbers for assistance for urgent and routine care needs. 3. Service will only be billed when office clinical staff spend 20 minutes or more in a month to coordinate care. 4. Only one practitioner may furnish and bill the service in a calendar month. 5. The patient may stop CCM services at any time (effective at the end of the month) by phone call to the office staff.   Patient agreed to services and verbal consent obtained.   Follow up plan:   Carley Perdue [employer] Scheduler

## 2021-02-22 ENCOUNTER — Other Ambulatory Visit: Payer: Self-pay | Admitting: Adult Health

## 2021-02-22 DIAGNOSIS — J4540 Moderate persistent asthma, uncomplicated: Secondary | ICD-10-CM

## 2021-02-23 LAB — CBC WITH DIFFERENTIAL/PLATELET
Hematocrit: 44.5 % (ref 34.0–46.6)
Hemoglobin: 14.1 g/dL (ref 11.1–15.9)
MCH: 31.1 pg (ref 26.6–33.0)
MCHC: 31.7 g/dL (ref 31.5–35.7)
MCV: 98 fL — ABNORMAL HIGH (ref 79–97)
Platelets: 360 10*3/uL (ref 150–450)
RBC: 4.53 x10E6/uL (ref 3.77–5.28)
RDW: 12.2 % (ref 11.7–15.4)
WBC: 6.9 10*3/uL (ref 3.4–10.8)

## 2021-02-23 LAB — COMPREHENSIVE METABOLIC PANEL
ALT: 11 IU/L (ref 0–32)
AST: 20 IU/L (ref 0–40)
Albumin/Globulin Ratio: 1.8 (ref 1.2–2.2)
Albumin: 4.8 g/dL — ABNORMAL HIGH (ref 3.7–4.7)
Alkaline Phosphatase: 55 IU/L (ref 44–121)
BUN/Creatinine Ratio: 25 (ref 12–28)
BUN: 25 mg/dL (ref 8–27)
Bilirubin Total: 0.4 mg/dL (ref 0.0–1.2)
CO2: 19 mmol/L — ABNORMAL LOW (ref 20–29)
Calcium: 10.4 mg/dL — ABNORMAL HIGH (ref 8.7–10.3)
Chloride: 102 mmol/L (ref 96–106)
Creatinine, Ser: 0.99 mg/dL (ref 0.57–1.00)
Globulin, Total: 2.6 g/dL (ref 1.5–4.5)
Glucose: 95 mg/dL (ref 65–99)
Potassium: 4.3 mmol/L (ref 3.5–5.2)
Sodium: 139 mmol/L (ref 134–144)
Total Protein: 7.4 g/dL (ref 6.0–8.5)
eGFR: 61 mL/min/{1.73_m2} (ref >59)

## 2021-02-23 LAB — LIPID PANEL WITH LDL/HDL RATIO
Cholesterol, Total: 203 mg/dL — ABNORMAL HIGH (ref 100–199)
HDL: 48 mg/dL (ref >39)
LDL Chol Calc (NIH): 127 mg/dL — ABNORMAL HIGH (ref 0–99)
LDL/HDL Ratio: 2.6 ratio (ref 0.0–3.2)
Triglycerides: 158 mg/dL — ABNORMAL HIGH (ref 0–149)
VLDL Cholesterol Cal: 28 mg/dL (ref 5–40)

## 2021-02-23 LAB — TSH+FREE T4
Free T4: 1.53 ng/dL (ref 0.82–1.77)
TSH: 0.652 u[IU]/mL (ref 0.450–4.500)

## 2021-02-23 NOTE — Progress Notes (Signed)
Labs reviewed, will discuss at next visit.

## 2021-02-25 ENCOUNTER — Ambulatory Visit
Admission: RE | Admit: 2021-02-25 | Discharge: 2021-02-25 | Disposition: A | Discharge status: Home / Self Care | Payer: Medicare Other | Source: Ambulatory Visit | Attending: Hospice and Palliative Medicine | Admitting: Hospice and Palliative Medicine

## 2021-02-25 ENCOUNTER — Other Ambulatory Visit: Payer: Self-pay

## 2021-02-25 DIAGNOSIS — R42 Dizziness and giddiness: Secondary | ICD-10-CM | POA: Insufficient documentation

## 2021-02-25 DIAGNOSIS — H532 Diplopia: Secondary | ICD-10-CM | POA: Diagnosis not present

## 2021-02-25 DIAGNOSIS — G44019 Episodic cluster headache, not intractable: Secondary | ICD-10-CM

## 2021-02-26 NOTE — Progress Notes (Signed)
Please let her know her head CT is normal.

## 2021-02-26 NOTE — Progress Notes (Signed)
She has not been seen for a fall, will need to make an appointment.

## 2021-03-01 ENCOUNTER — Ambulatory Visit: Payer: Medicare Other

## 2021-03-02 ENCOUNTER — Ambulatory Visit (INDEPENDENT_AMBULATORY_CARE_PROVIDER_SITE_OTHER): Payer: Medicare Other | Admitting: Internal Medicine

## 2021-03-02 ENCOUNTER — Other Ambulatory Visit: Payer: Self-pay

## 2021-03-02 VITALS — BP 134/78 | HR 80 | Temp 97.5°F | Resp 16 | Ht 61.0 in | Wt 152.4 lb

## 2021-03-02 DIAGNOSIS — G44309 Post-traumatic headache, unspecified, not intractable: Secondary | ICD-10-CM

## 2021-03-02 DIAGNOSIS — G8929 Other chronic pain: Secondary | ICD-10-CM

## 2021-03-02 DIAGNOSIS — M545 Low back pain, unspecified: Secondary | ICD-10-CM

## 2021-03-02 MED ORDER — HYDROCODONE-ACETAMINOPHEN 5-300 MG PO TABS: ORAL_TABLET | ORAL | 0 refills | Status: DC

## 2021-03-02 MED ORDER — BACLOFEN 10 MG PO TABS: ORAL_TABLET | ORAL | 0 refills | Status: DC
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
## 2021-03-09 ENCOUNTER — Other Ambulatory Visit: Payer: Self-pay | Admitting: Internal Medicine

## 2021-03-19 ENCOUNTER — Other Ambulatory Visit: Payer: Self-pay

## 2021-03-19 ENCOUNTER — Encounter: Payer: Self-pay | Admitting: Hospice and Palliative Medicine

## 2021-03-19 ENCOUNTER — Ambulatory Visit
Admission: RE | Admit: 2021-03-19 | Discharge: 2021-03-19 | Disposition: A | Discharge status: Home / Self Care | Payer: Medicare Other | Source: Ambulatory Visit | Attending: Hospice and Palliative Medicine | Admitting: Hospice and Palliative Medicine

## 2021-03-19 ENCOUNTER — Ambulatory Visit
Admission: RE | Admit: 2021-03-19 | Discharge: 2021-03-19 | Disposition: A | Discharge status: Home / Self Care | Payer: Medicare Other | Attending: Hospice and Palliative Medicine | Admitting: Hospice and Palliative Medicine

## 2021-03-19 ENCOUNTER — Ambulatory Visit (INDEPENDENT_AMBULATORY_CARE_PROVIDER_SITE_OTHER): Payer: Medicare Other | Admitting: Hospice and Palliative Medicine

## 2021-03-19 VITALS — BP 138/80 | HR 80 | Temp 98.3°F | Resp 16 | Ht 61.0 in | Wt 151.8 lb

## 2021-03-19 DIAGNOSIS — M545 Low back pain, unspecified: Secondary | ICD-10-CM | POA: Insufficient documentation

## 2021-03-19 DIAGNOSIS — G44019 Episodic cluster headache, not intractable: Secondary | ICD-10-CM | POA: Diagnosis not present

## 2021-03-19 DIAGNOSIS — J4540 Moderate persistent asthma, uncomplicated: Secondary | ICD-10-CM | POA: Diagnosis not present

## 2021-03-19 NOTE — Progress Notes (Signed)
[facility] [place], [place] [address]  Internal MEDICINE  Office Visit Note  Patient Name: Bonnie West  [id number]  [id number]  Date of Service: 03/24/2021  Chief Complaint  Patient presents with  . Follow-up    Review labs and CT, discuss meds, still having problems from when she feel, tail bone still is painful  . Depression  . Gastroesophageal Reflux  . Sleep Apnea  . Hyperlipidemia  . Hypertension  . Anxiety  . Asthma    HPI Patient is here for routine follow-up Did not start Remeron to help with insomnia due to possible side effects of weight gain Continues to have daily headaches and at least weekly migraines Continues to have double and blurred vision--scheduled to see neuro opthamalogist next month at [place] to have low back and sacral pain from her fall--will take Norco as needed when she is having severe pain but tries to not take this medication frequently  Current Medication: Outpatient Encounter Medications as of 03/19/2021  Medication Sig  . acetaminophen (TYLENOL) 500 MG tablet Take 1,000 mg by mouth 2 (two) times daily as needed for mild pain or moderate pain.  Marland Kitchen albuterol (PROVENTIL) (2.5 MG/3ML) 0.083% nebulizer solution Take 3 mLs (2.5 mg total) by nebulization every 6 (six) hours as needed for wheezing or shortness of breath.  Marland Kitchen albuterol (VENTOLIN HFA) 108 (90 Base) MCG/ACT inhaler INHALE 2 PUFFS BY MOUTH EVERY 6 HOURS  . ALPRAZolam (XANAX) 0.25 MG tablet Take 1 tablet (0.25 mg total) by mouth 2 (two) times daily as needed for anxiety.  . baclofen (LIORESAL) 10 MG tablet TAKE 1 TABLET BY MOUTH TWICE A DAY AS NEEDED BACK PAIN  . bisacodyl (DULCOLAX) 5 MG EC tablet Take 5-10 mg by mouth at bedtime as needed for moderate constipation. Two at night  . cetirizine (ZYRTEC) 10 MG tablet Take 10 mg by mouth daily.  . cholecalciferol (VITAMIN D) 1000 units tablet Take 1,000 Units by mouth at bedtime.  . clindamycin (CLEOCIN T) 1  % external solution Apply 1 application topically 2 (two) times daily as needed. For skin breakdown  . docusate sodium (COLACE) 100 MG capsule Take 100-200 mg by mouth at bedtime as needed for mild constipation or moderate constipation.   Marland Kitchen estradiol (ESTRACE) 0.1 MG/GM vaginal cream INSERT 1 GRAM VAGINALLY TWICE WEEKLY AT BEDTIME  . fenofibrate (TRICOR) 145 MG tablet Take 1 tablet (145 mg total) by mouth daily.  Marland Kitchen FLUoxetine (PROZAC) 40 MG capsule Take 1 capsule (40 mg total) by mouth daily.  . fluticasone (FLOVENT HFA) 110 MCG/ACT inhaler Use 2 puffs inhaled twice dilay  . HYDROcodone-Acetaminophen 5-300 MG TABS Take one tab po bid for back pain  . ipratropium (ATROVENT HFA) 17 MCG/ACT inhaler INHALE 2 PUFFS 4 TIMES DAILY  . ipratropium (ATROVENT) 0.02 % nebulizer solution Use 1 vial nebulized QID prn shortness of breath/wheezing.  . isometheptene-acetaminophen-dichloralphenazone (MIDRIN) 65-100-325 MG capsule Take 1 capsule by mouth 4 (four) times daily as needed for migraine. Maximum 5 capsules in 12 hours for migraine headaches, 8 capsules in 24 hours for tension headaches.  . levothyroxine (SYNTHROID) 100 MCG tablet TAKE 1 TABLET BY MOUTH EVERY DAY IN THE MORNING  . lisinopril (ZESTRIL) 10 MG tablet TAKE 1 TABLET BY MOUTH EVERY DAY  . magnesium oxide (MAG-OX) 400 MG tablet Take 400 mg by mouth at bedtime.   . montelukast (SINGULAIR) 10 MG tablet Take 1 tablet (10 mg total) by mouth daily.  Marland Kitchen omeprazole (PRILOSEC)  40 MG capsule TAKE 1 CAPSULE BY MOUTH EVERY DAY  . polyethylene glycol powder (GLYCOLAX/MIRALAX) 17 GM/SCOOP powder Take 17 g by mouth every evening.  . triamcinolone cream (KENALOG) 0.1 % Apply 1 application topically 2 (two) times daily as needed. For skin breakdown  . vitamin B-12 (CYANOCOBALAMIN) 1000 MCG tablet Take 1,000 mcg by mouth daily.  . [DISCONTINUED] zolpidem (AMBIEN) 5 MG tablet Take 1/2 to 1 tablets po QHS prn insomnia   No facility-administered encounter  medications on file as of 03/19/2021.    Surgical History: Past Surgical History:  Procedure Laterality Date  . BUNIONECTOMY    . CATARACT EXTRACTION W/PHACO Right 10/11/2016   Procedure: CATARACT EXTRACTION PHACO AND INTRAOCULAR LENS PLACEMENT (IOC);  Surgeon: Birder Robson, MD;  Location: [facility] ORS;  Service: Ophthalmology;  Laterality: Right;  Lot# [id number] H [place]: 00:37.7 AP%: 18.1 CDE: 6.80  . CATARACT EXTRACTION W/PHACO Left 11/08/2016   Procedure: CATARACT EXTRACTION PHACO AND INTRAOCULAR LENS PLACEMENT (IOC);  Surgeon: Birder Robson, MD;  Location: [facility] ORS;  Service: Ophthalmology;  Laterality: Left;  PACK LOT: [id number] H US:00:32 AP:44 CDE:6.46  . COLONOSCOPY    . COLONOSCOPY WITH PROPOFOL N/A 11/26/2015   Procedure: COLONOSCOPY WITH PROPOFOL;  Surgeon: Manya Silvas, MD;  Location: [facility] ENDOSCOPY;  Service: Endoscopy;  Laterality: N/A;  . EYE SURGERY    . TONSILLECTOMY      Medical History: Past Medical History:  Diagnosis Date  . Allergic rhinitis   . Anxiety   . Asthma   . Blood transfusion without reported diagnosis   . Depression   . Diverticulosis   . Dyspnea   . Esophagitis   . GERD (gastroesophageal reflux disease)   . Headache   . Heart murmur   . Hyperlipemia   . Hypertension   . Hypothyroidism   . Obesity   . Osteopenia   . Palpitations   . Pneumothorax   . Sleep apnea     Family History: Family History  Problem Relation Age of Onset  . Breast cancer Maternal Grandmother   . Stroke Maternal Grandmother   . Bladder Cancer Father   . Prostate cancer Father   . Heart attack Father   . Aortic aneurysm Father   . Congestive Heart Failure Father   . Colon cancer Paternal Grandmother   . Aneurysm Paternal Grandmother   . Stroke Paternal Grandmother   . Congestive Heart Failure Paternal Grandmother   . Dementia Mother   . Stroke Mother   . Dementia Maternal Grandfather   . Stroke Maternal Grandfather   . Dementia Paternal Grandfather   .  Stroke Paternal Grandfather     Social History   Socioeconomic History  . Marital status: Married    Spouse name: Not on file  . Number of children: Not on file  . Years of education: Not on file  . Highest education level: Not on file  Occupational History  . Not on file  Tobacco Use  . Smoking status: Former Research scientist (life sciences)  . Smokeless tobacco: Never Used  Vaping Use  . Vaping Use: Never used  Substance and Sexual Activity  . Alcohol use: No  . Drug use: No  . Sexual activity: Not Currently    Partners: Male    Birth control/protection: Post-menopausal  Other Topics Concern  . Not on file  Social History Narrative  . Not on file   Social Determinants of Health   Financial Resource Strain: Low Risk   . Difficulty of Paying Living Expenses: Not very  hard  Food Insecurity: Not on file  Transportation Needs: Not on file  Physical Activity: Not on file  Stress: Not on file  Social Connections: Not on file  Intimate Partner Violence: Not on file      Review of Systems  Constitutional: Negative for chills, diaphoresis and fatigue.  HENT: Negative for ear pain, postnasal drip and sinus pressure.   Eyes: Negative for photophobia, discharge, redness, itching and visual disturbance.  Respiratory: Negative for cough, shortness of breath and wheezing.   Cardiovascular: Negative for chest pain, palpitations and leg swelling.  Gastrointestinal: Negative for abdominal pain, constipation, diarrhea, nausea and vomiting.  Genitourinary: Negative for dysuria and flank pain.  Musculoskeletal: Negative for arthralgias, back pain, gait problem and neck pain.       Low back and sacral pain  Skin: Negative for color change.  Allergic/Immunologic: Negative for environmental allergies and food allergies.  Neurological: Positive for headaches. Negative for dizziness.  Hematological: Does not bruise/bleed easily.  Psychiatric/Behavioral: Negative for agitation, behavioral problems (depression)  and hallucinations.    Vital Signs: BP 138/80   Pulse 80   Temp 98.3 F (36.8 C)   Resp 16   Ht 5\' 1"  (1.549 m)   Wt 151 lb 12.8 oz (68.9 kg)   SpO2 97%   BMI 28.68 kg/m    Physical Exam Vitals reviewed.  Constitutional:      Appearance: Normal appearance. She is normal weight.  Cardiovascular:     Rate and Rhythm: Normal rate and regular rhythm.     Pulses: Normal pulses.     Heart sounds: Normal heart sounds.  Pulmonary:     Effort: Pulmonary effort is normal.     Breath sounds: Normal breath sounds.  Musculoskeletal:        General: Normal range of motion.  Skin:    General: Skin is warm.  Neurological:     General: No focal deficit present.     Mental Status: She is alert and oriented to person, place, and time. Mental status is at baseline.  Psychiatric:        Mood and Affect: Mood normal.        Behavior: Behavior normal.        Thought Content: Thought content normal.        Judgment: Judgment normal.    Assessment/Plan: 1. Episodic cluster headache, not intractable CT head negative, likely secondary to visual disturbance, has follow-up with neuro ophthalmologist   2. Acute midline low back pain without sciatica Review imaging and adjust therapy as indicated - DG Lumbar Spine Complete; Future  3. Moderate persistent asthma without status asthmaticus without complication Breathing remains well controlled, continue to monitor  General Counseling: Sandy Salaam understanding of the findings of todays visit and agrees with plan of treatment. I have discussed any further diagnostic evaluation that may be needed or ordered today. We also reviewed her medications today. she has been encouraged to call the office with any questions or concerns that should arise related to todays visit.    Orders Placed This Encounter  Procedures  . DG Lumbar Spine Complete      Time spent: 30 Minutes Time spent includes review of chart, medications, test results and  follow-up plan with the patient.  This patient was seen by Theodoro Grist AGNP-C in Collaboration with Dr Lavera Guise as a part of collaborative care agreement     Tanna Furry. Loudon Krakow AGNP-C Internal medicine

## 2021-03-22 ENCOUNTER — Telehealth: Payer: Self-pay | Admitting: Pharmacist

## 2021-03-22 NOTE — Progress Notes (Signed)
Chronic Care Management Pharmacy Note  03/23/2021 Name:  Bonnie West MRN:  [id number] DOB:  1951-08-20  Subjective: Bonnie West is an 70 y.o. year old female who is a primary patient of Humphrey Rolls Timoteo Gaul, MD.  The CCM team was consulted for assistance with disease management and care coordination needs.    Engaged with patient by telephone for initial visit in response to provider referral for pharmacy case management and/or care coordination services.   Consent to Services:  The patient was given the following information about Chronic Care Management services today, agreed to services, and gave verbal consent: 1. CCM service includes personalized support from designated clinical staff supervised by the primary care provider, including individualized plan of care and coordination with other care providers 2. 24/7 contact phone numbers for assistance for urgent and routine care needs. 3. Service will only be billed when office clinical staff spend 20 minutes or more in a month to coordinate care. 4. Only one practitioner may furnish and bill the service in a calendar month. 5.The patient may stop CCM services at any time (effective at the end of the month) by phone call to the office staff. 6. The patient will be responsible for cost sharing (co-pay) of up to 20% of the service fee (after annual deductible is met). Patient agreed to services and consent obtained.  Patient Care Team: Lavera Guise, MD as PCP - General (Internal Medicine) Edythe Clarity, [facility] as Pharmacist (Pharmacist)  Recent office visits: 03/19/21 Luiz Ochoa, NP. For back pain. STOPPED Zolpidem 03/02/21 Lavera Guise, MD. For back pain. STARTED Baclofen 10 mg CHANGED Hydrocodone- Acetaminophen 5-300 mg. STOPPED Cyclobenzaprine and Mirtazapine  02/08/21 Theodoro Grist, Chauncey Cruel. NP. For Shortness of breath. Per note: The doctor and the patient discussed CPAP and BPAP information. No medication changes.  02/03/21 Theodoro Grist,  Chauncey Cruel NP. For follow-up. STARTED Mirtazapine 7.5 mg daily at bedtime HOLD Ambien. 10/12/20 Allyne Gee, MD. For follow-Up on asthma. Per note: the doctor and the patient discussed sleep apnea. No medication changes.  Recent consult visits: None in last 6 months.  Hospital visits: None in previous 6 months  Objective:  Lab Results  Component Value Date   CREATININE 0.99 02/19/2021   BUN 25 02/19/2021   GFRNONAA 64 03/03/2020   GFRAA 74 03/03/2020   NA 139 02/19/2021   K 4.3 02/19/2021   CALCIUM 10.4 (H) 02/19/2021   CO2 19 (L) 02/19/2021   GLUCOSE 95 02/19/2021    No results found for: HGBA1C, FRUCTOSAMINE, GFR, MICROALBUR  Last diabetic Eye exam: No results found for: HMDIABEYEEXA  Last diabetic Foot exam: No results found for: HMDIABFOOTEX   Lab Results  Component Value Date   CHOL 203 (H) 02/19/2021   HDL 48 02/19/2021   LDLCALC 127 (H) 02/19/2021   TRIG 158 (H) 02/19/2021    Hepatic Function Latest Ref Rng & Units 02/19/2021 03/03/2020 08/31/2018  Total Protein 6.0 - 8.5 g/dL 7.4 7.3 7.1  Albumin 3.7 - 4.7 g/dL 4.8(H) 4.7 5.0(H)  AST 0 - 40 IU/L _0 ALT 0 - 32 IU/L _1 Alk Phosphatase 44 - 121 IU/L 55 52 49  Total Bilirubin 0.0 - 1.2 mg/dL 0.4 0.4 0.4    Lab Results  Component Value Date/Time   TSH 0.652 02/19/2021 11:59 AM   TSH 0.961 03/03/2020 11:29 AM   FREET4 1.53 02/19/2021 11:59 AM   FREET4 1.71 03/03/2020 11:29 AM  CBC Latest Ref Rng & Units 02/19/2021 03/03/2020 08/31/2018  WBC 3.4 - 10.8 x10E3/uL 6.9 7.3 7.1  Hemoglobin 11.1 - 15.9 g/dL 14.1 14.2 14.5  Hematocrit 34.0 - 46.6 % 44.5 41.5 42.6  Platelets 150 - 450 x10E3/uL 360 324 349    Lab Results  Component Value Date/Time   VD25OH 40.6 03/03/2020 11:29 AM   VD25OH 38.9 08/31/2018 01:39 PM    Clinical ASCVD: No  The 10-year ASCVD risk score Mikey Bussing DC Jr., et al., 2013) is: 16.5%   Values used to calculate the score:     Age: 7 years     Sex: Female     Is Non-Hispanic  African American: No     Diabetic: No     Tobacco smoker: No     Systolic Blood Pressure: 832 mmHg     Is BP treated: Yes     HDL Cholesterol: 48 mg/dL     Total Cholesterol: 203 mg/dL    Depression screen [facility] 2/9 02/03/2021 11/05/2020 08/20/2020  Decreased Interest 0 0 0  Down, Depressed, Hopeless 0 0 0  PHQ - 2 Score 0 0 0  Altered sleeping - - -  Tired, decreased energy - - -  Change in appetite - - -  Feeling bad or failure about yourself  - - -  Trouble concentrating - - -  Moving slowly or fidgety/restless - - -  Suicidal thoughts - - -  PHQ-9 Score - - -      Social History   Tobacco Use  Smoking Status Former Smoker  Smokeless Tobacco Never Used   BP Readings from Last 3 Encounters:  03/19/21 138/80  03/02/21 134/78  02/08/21 134/88   Pulse Readings from Last 3 Encounters:  03/19/21 80  03/02/21 80  02/08/21 83   Wt Readings from Last 3 Encounters:  03/19/21 151 lb 12.8 oz (68.9 kg)  03/02/21 152 lb 6.4 oz (69.1 kg)  02/08/21 149 lb 9.6 oz (67.9 kg)   BMI Readings from Last 3 Encounters:  03/19/21 28.68 kg/m  03/02/21 28.80 kg/m  02/08/21 28.27 kg/m    Assessment/Interventions: Review of patient past medical history, allergies, medications, health status, including review of consultants reports, laboratory and other test data, was performed as part of comprehensive evaluation and provision of chronic care management services.   SDOH:  (Social Determinants of Health) assessments and interventions performed: Yes  SDOH Screenings   Alcohol Screen: Low Risk   . Last Alcohol Screening Score (AUDIT): 0  Depression (PHQ2-9): Low Risk   . PHQ-2 Score: 0  Financial Resource Strain: Low Risk   . Difficulty of Paying Living Expenses: Not very hard  Food Insecurity: Not on file  Housing: Not on file  Physical Activity: Not on file  Social Connections: Not on file  Stress: Not on file  Tobacco Use: Medium Risk  . Smoking Tobacco Use: Former Smoker  .  Smokeless Tobacco Use: Never Used  Transportation Needs: Not on file    CCM Care Plan  Allergies  Allergen Reactions  . Advair Diskus [Fluticasone-Salmeterol] Other (See Comments)    Causes asthma attack   . Codeine   . Erythromycin Other (See Comments)    Stomach pain  . Morphine And Related Itching and Nausea And Vomiting  . Nsaids   . Sulfa Antibiotics Other (See Comments)    unknown    Medications Reviewed Today    Reviewed by Luiz Ochoa, NP (Nurse Practitioner) on 03/19/21 at 1214  Med  List Status: <None>  Medication Order Taking? Sig Documenting Provider Last Dose Status Informant  acetaminophen (TYLENOL) 500 MG tablet [id number] No Take 1,000 mg by mouth 2 (two) times daily as needed for mild pain or moderate pain. [provider] Taking Active Self  albuterol (PROVENTIL) (2.5 MG/3ML) 0.083% nebulizer solution [id number] No Take 3 mLs (2.5 mg total) by nebulization every 6 (six) hours as needed for wheezing or shortness of breath. Ronnell Freshwater, NP Taking Active   albuterol (VENTOLIN HFA) 108 (90 Base) MCG/ACT inhaler [id number] No INHALE 2 PUFFS BY MOUTH EVERY 6 HOURS [place], Audie Clear, NP Taking Active   ALPRAZolam Duanne Moron) 0.25 MG tablet [id number]  Take 1 tablet (0.25 mg total) by mouth 2 (two) times daily as needed for anxiety. Luiz Ochoa, NP  Active   baclofen (LIORESAL) 10 MG tablet [id number]  TAKE 1 TABLET BY MOUTH TWICE A DAY AS NEEDED BACK PAIN Luiz Ochoa, NP  Active   bisacodyl (DULCOLAX) 5 MG EC tablet [id number] No Take 5-10 mg by mouth at bedtime as needed for moderate constipation. Two at night [provider] Taking Active Self  cetirizine (ZYRTEC) 10 MG tablet [id number] No Take 10 mg by mouth daily. [provider] Taking Active Self  cholecalciferol (VITAMIN D) 1000 units tablet [id number] No Take 1,000 Units by mouth at bedtime. [provider] Taking Active Self  clindamycin (CLEOCIN T) 1 % external solution  [id number] No Apply 1 application topically 2 (two) times daily as needed. For skin breakdown Ronnell Freshwater, NP Taking Active   docusate sodium (COLACE) 100 MG capsule [id number] No Take 100-200 mg by mouth at bedtime as needed for mild constipation or moderate constipation.  [provider] Taking Active Self  estradiol (ESTRACE) 0.1 MG/GM vaginal cream [id number] No INSERT 1 GRAM VAGINALLY TWICE WEEKLY AT BEDTIME Boscia, Heather E, NP Taking Active   fenofibrate (TRICOR) 145 MG tablet [id number]  Take 1 tablet (145 mg total) by mouth daily. Luiz Ochoa, NP  Active   FLUoxetine (PROZAC) 40 MG capsule [id number]  Take 1 capsule (40 mg total) by mouth daily. Luiz Ochoa, NP  Active   fluticasone [facility] HFA) 110 MCG/ACT inhaler [id number]  Use 2 puffs inhaled twice dilay Ronnell Freshwater, NP  Active   HYDROcodone-Acetaminophen 5-300 MG TABS [id number]  Take one tab po bid for back pain Lavera Guise, MD  Active   ipratropium (ATROVENT HFA) 17 MCG/ACT inhaler [id number]  INHALE 2 PUFFS 4 TIMES DAILY Boscia, Heather E, NP  Active   ipratropium (ATROVENT) 0.02 % nebulizer solution [id number] No Use 1 vial nebulized QID prn shortness of breath/wheezing. Ronnell Freshwater, NP Taking Active   isometheptene-acetaminophen-dichloralphenazone (MIDRIN) [phone] MG capsule [id number] No Take 1 capsule by mouth 4 (four) times daily as needed for migraine. Maximum 5 capsules in 12 hours for migraine headaches, 8 capsules in 24 hours for tension headaches. [provider] Taking Active Self  levothyroxine (SYNTHROID) 100 MCG tablet [id number]  TAKE 1 TABLET BY MOUTH EVERY DAY IN THE MORNING Luiz Ochoa, NP  Active   lisinopril (ZESTRIL) 10 MG tablet [id number]  TAKE 1 TABLET BY MOUTH EVERY DAY Lavera Guise, MD  Active   magnesium oxide (MAG-OX) 400 MG tablet [id number] No Take 400 mg by mouth at bedtime.  [provider] Taking Active Self  montelukast (SINGULAIR) 10 MG tablet  [id number] No Take 1 tablet (10 mg total) by mouth daily. Ronnell Freshwater, NP Taking  Active   omeprazole (PRILOSEC) 40 MG capsule [id number] No TAKE 1 CAPSULE BY MOUTH EVERY DAY Boscia, Heather E, NP Taking Active   polyethylene glycol powder (GLYCOLAX/MIRALAX) 17 GM/SCOOP powder [id number] No Take 17 g by mouth every evening. Ronnell Freshwater, NP Taking Active   triamcinolone cream (KENALOG) 0.1 % [id number] No Apply 1 application topically 2 (two) times daily as needed. For skin breakdown [provider] Taking Active Self  vitamin B-12 (CYANOCOBALAMIN) 1000 MCG tablet [id number] No Take 1,000 mcg by mouth daily. [provider] Taking Active Self          Patient Active Problem List   Diagnosis Date Noted  . Vaginal atrophy 08/20/2020  . Diastolic dysfunction 04/88/8916  . OSA on CPAP 07/15/2020  . Tightness in chest 05/20/2020  . Hypercalcemia 05/20/2020  . Primary insomnia 05/20/2020  . Acute non-recurrent pansinusitis 02/12/2020  . Encounter for general adult medical examination with abnormal findings 11/10/2019  . Gastroesophageal reflux disease without esophagitis 11/10/2019  . Dysuria 11/10/2019  . Acute otitis externa of left ear 07/15/2019  . Irritable bowel syndrome with constipation 07/15/2019  . Chronic midline low back pain without sciatica 04/27/2019  . Allergic rhinitis due to pollen 04/27/2019  . Essential hypertension, benign 04/27/2019  . GAD (generalized anxiety disorder) 09/01/2018  . Chronic migraine without aura without status migrainosus, not intractable 09/01/2018  . Encounter for long-term (current) use of medications 09/01/2018  . Asthma without status asthmaticus 01/30/2018  . Hyperlipidemia, unspecified 01/30/2018  . Hypertension 01/30/2018  . Obesity, unspecified 01/30/2018  . Hypothyroidism 01/30/2018  . Fatty infiltration of liver 12/10/2015  . Abdominal pain, LLQ (left lower quadrant) 11/03/2015  . History of adenomatous polyp of  colon 11/03/2015  . Hallux valgus, left 09/18/2014  . 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  . Shoulder arthritis 12/19/2013    Immunization History  Administered Date(s) Administered  . Fluad Quad(high Dose 65+) 10/09/2019  . Influenza, High Dose Seasonal PF 10/07/2015, 08/31/2018  . Influenza,inj,Quad PF,6+ Mos 09/24/2014  . Influenza-Unspecified 11/20/2017, 12/23/2020, 12/25/2020  . PFIZER(Purple Top)SARS-COV-2 Vaccination 01/03/2020, 01/29/2020, 11/27/2020  . Pneumococcal Conjugate-13 11/20/2017    Conditions to be addressed/monitored:  Rudean Hitt, Insomnia  Care Plan : General Pharmacy (Adult)  Updates made by Edythe Clarity, RPH since 03/23/2021 12:00 AM    Problem: HTN,Asthma,HLD, Insomnia, migraine   Priority: High  Onset Date: 03/23/2021    Long-Range Goal: Patient-Specific Goal   Start Date: 03/23/2021  Expected End Date: 09/23/2021  This Visit's Progress: On track  Priority: High  Note:   Current Barriers:  . Unable to achieve control of lipids  . Suboptimal therapeutic regimen for cholesterol  Pharmacist Clinical Goal(s):  Marland Kitchen Patient will achieve control of cholesterol as evidenced by lipid panel . adhere to plan to optimize therapeutic regimen for HLD as evidenced by report of adherence to recommended medication management changes . contact provider office for questions/concerns as evidenced notation of same in electronic health record through collaboration with PharmD and provider.   Interventions: . 1:1 collaboration with Lavera Guise, MD regarding development and update of comprehensive plan of care as evidenced by provider attestation and co-signature . Inter-disciplinary care team collaboration (see longitudinal plan of care) . Comprehensive medication review performed; medication list updated in electronic medical record  Hypertension (BP goal <140/90) -Controlled -Current treatment: . Lisinopril $RemoveBefo'10mg'rXYGFDDqtnS$  -Medications previously tried: amlodipine  (d/c)  -Current home readings: 128-130/80s per patient, no logs at appontment -Current dietary habits: says she has been eating more  sweets lately -Current exercise habits: minimal due to pain from recent fall -Denies hypotensive/hypertensive symptoms -Educated on BP goals and benefits of medications for prevention of heart attack, stroke and kidney damage; Daily salt intake goal < 2300 mg; Exercise goal of 150 minutes per week; Importance of home blood pressure monitoring; -Counseled to monitor BP at home daily, document, and provide log at future appointments -Recommended to continue current medication  Hyperlipidemia: (LDL goal < 100) -Uncontrolled -Current treatment: . Fenofibrate 159m daily -Medications previously tried: none noted  -Current dietary patterns: eating more sweets, drinks diet sodas -Current exercise habits: minimal exercise now -Educated on Cholesterol goals;  Benefits of statin for ASCVD risk reduction; Importance of limiting foods high in cholesterol; Reviewed most recent lipid panel and meaning of LDL/HDL/TG  -She reports she has tried statins in the past and had muscle aches, however I could not find it in her med history.  Recommended she work on the diet and have cholesterol re checked.  -Recommended to continue current medication If still elevated at next check, would recommend initiation of Rosuvastatin 574mdaily.  If she cannot tolerate that I would accept every other day  Asthma (Goal: control symptoms and prevent exacerbations) -Controlled -Current treatment  . Atrovent HFA 1752m. Flovent HFA 110m9m Ventolin HFA 90mc78mn . Albuterol 0.083% nebulizer solution -Medications previously tried: Adviar   -Exacerbations requiring treatment in last 6 months: none -Patient reports consistent use of maintenance inhaler -Frequency of rescue inhaler use: nebulizers as needed, infrequently lately -Counseled on Proper inhaler technique; Benefits of  consistent maintenance inhaler use When to use rescue inhaler Differences between maintenance and rescue inhalers  -She reports her breathing is as controlled as it has ever been recently.  Denies any restrictions in activities from breathing. -SOB only with exertion. -Recommended to continue current medication  Chronic migraines (Goal: minimize symptoms) -Controlled -Current treatment  . Midrin 65-100-325mg 26m the market) -Medications previously tried: Sumatriptan (hangover like effect) -Patient still has some left over Midrin that she is taking, however this has been taken off the market. -She still has occasional migraines and wants something to have on hand if she were to get one.  She mentions not liking the hangover effect of triptans. -Recommended possibility of Fioricet to have as emergency.  Will consult with NP at next visit     Patient Goals/Self-Care Activities . Patient will:  - take medications as prescribed check blood pressure dailyy, document, and provide at future appointments engage in dietary modifications by cutting back on sweets in diet to work on cholesterol  Follow Up Plan: The care management team will reach out to the patient again over the next 90 days.         Medication Assistance: None required.  Patient affirms current coverage meets needs.  Patient's preferred pharmacy is:  [id number] [place] [id number] [id number] - [phone] box? Yes - organizes them two weeks at a time Pt endorses  100% compliance  We discussed: Benefits of medication synchronization, packaging and delivery as well as enhanced pharmacist oversight with [employer]. Patient decided to: Continue current medication management strategy  Care Plan and Follow  Up Patient Decision:  Patient agrees to Care Plan and Follow-up.  Plan: The care management team will  reach out to the patient again over the next 90 days.  Beverly Milch, PharmD Clinical Pharmacist [place] [phone]

## 2021-03-22 NOTE — Progress Notes (Addendum)
Chronic Care Management Pharmacy Assistant   Name: Bonnie West  MRN: [id number] DOB: 1951/11/04  Bonnie West is an 70 y.o. year old female who presents for his initial CCM visit with the clinical pharmacist.  Reason for Encounter: Chart Prep   Conditions to be addressed/monitored: HTN,Asthma,HLD, Insomnia.  Primary concerns for visit include: Back pain and Asthma.  Recent office visits:  03/19/21 Luiz Ochoa, NP. For back pain. STOPPED Zolpidem 03/02/21 Lavera Guise, MD. For back pain. STARTED Baclofen 10 mg CHANGED Hydrocodone- Acetaminophen 5-300 mg. STOPPED Cyclobenzaprine and Mirtazapine  02/08/21 Theodoro Grist, Chauncey Cruel. NP. For Shortness of breath. Per note: The doctor and the patient discussed CPAP and BPAP information. No medication changes.  02/03/21 Theodoro Grist, Chauncey Cruel NP. For follow-up. STARTED Mirtazapine 7.5 mg daily at bedtime HOLD Ambien. 10/12/20 Allyne Gee, MD. For follow-Up on asthma. Per note: the doctor and the patient discussed sleep apnea. No medication changes.  Recent consult visits:  None in last 6 months.  Hospital visits:  None in previous 6 months  Medications: Outpatient Encounter Medications as of 03/22/2021  Medication Sig   acetaminophen (TYLENOL) 500 MG tablet Take 1,000 mg by mouth 2 (two) times daily as needed for mild pain or moderate pain.   albuterol (PROVENTIL) (2.5 MG/3ML) 0.083% nebulizer solution Take 3 mLs (2.5 mg total) by nebulization every 6 (six) hours as needed for wheezing or shortness of breath.   albuterol (VENTOLIN HFA) 108 (90 Base) MCG/ACT inhaler INHALE 2 PUFFS BY MOUTH EVERY 6 HOURS   ALPRAZolam (XANAX) 0.25 MG tablet Take 1 tablet (0.25 mg total) by mouth 2 (two) times daily as needed for anxiety.   baclofen (LIORESAL) 10 MG tablet TAKE 1 TABLET BY MOUTH TWICE A DAY AS NEEDED BACK PAIN   bisacodyl (DULCOLAX) 5 MG EC tablet Take 5-10 mg by mouth at bedtime as needed for moderate constipation. Two at night   cetirizine  (ZYRTEC) 10 MG tablet Take 10 mg by mouth daily.   cholecalciferol (VITAMIN D) 1000 units tablet Take 1,000 Units by mouth at bedtime.   clindamycin (CLEOCIN T) 1 % external solution Apply 1 application topically 2 (two) times daily as needed. For skin breakdown   docusate sodium (COLACE) 100 MG capsule Take 100-200 mg by mouth at bedtime as needed for mild constipation or moderate constipation.    estradiol (ESTRACE) 0.1 MG/GM vaginal cream INSERT 1 GRAM VAGINALLY TWICE WEEKLY AT BEDTIME   fenofibrate (TRICOR) 145 MG tablet Take 1 tablet (145 mg total) by mouth daily.   FLUoxetine (PROZAC) 40 MG capsule Take 1 capsule (40 mg total) by mouth daily.   fluticasone (FLOVENT HFA) 110 MCG/ACT inhaler Use 2 puffs inhaled twice dilay   HYDROcodone-Acetaminophen 5-300 MG TABS Take one tab po bid for back pain   ipratropium (ATROVENT HFA) 17 MCG/ACT inhaler INHALE 2 PUFFS 4 TIMES DAILY   ipratropium (ATROVENT) 0.02 % nebulizer solution Use 1 vial nebulized QID prn shortness of breath/wheezing.   isometheptene-acetaminophen-dichloralphenazone (MIDRIN) 65-100-325 MG capsule Take 1 capsule by mouth 4 (four) times daily as needed for migraine. Maximum 5 capsules in 12 hours for migraine headaches, 8 capsules in 24 hours for tension headaches.   levothyroxine (SYNTHROID) 100 MCG tablet TAKE 1 TABLET BY MOUTH EVERY DAY IN THE MORNING   lisinopril (ZESTRIL) 10 MG tablet TAKE 1 TABLET BY MOUTH EVERY DAY   magnesium oxide (MAG-OX) 400 MG tablet Take 400 mg by mouth at bedtime.    montelukast (SINGULAIR)  10 MG tablet Take 1 tablet (10 mg total) by mouth daily.   omeprazole (PRILOSEC) 40 MG capsule TAKE 1 CAPSULE BY MOUTH EVERY DAY   polyethylene glycol powder (GLYCOLAX/MIRALAX) 17 GM/SCOOP powder Take 17 g by mouth every evening.   triamcinolone cream (KENALOG) 0.1 % Apply 1 application topically 2 (two) times daily as needed. For skin breakdown   vitamin B-12 (CYANOCOBALAMIN) 1000 MCG tablet Take 1,000 mcg by mouth  daily.   No facility-administered encounter medications on file as of 03/22/2021.    Have you seen any other providers since your last visit? Patient stated no.  Any changes in your medications or health? Patient stated no.  Any side effects from any medications? Patient stated no.  Do you have an symptoms or problems not managed by your medications? Patient stated no.   Any concerns about your health right now? Patient stated she is concerned about her migraine medication.  Has your provider asked that you check blood pressure, blood sugar, or follow special diet at home? Patient stated she checks her blood pressure sometimes.  Do you get any type of exercise on a regular basis? Patient stated no, she recently fell and she stated walking hurts so she doesn't do much moving.  Can you think of a goal you would like to reach for your health? Patient state she would like to loose weight.  Do you have any problems getting your medications? Patient stated no.  Is there anything that you would like to discuss during the appointment? Patient stated no.  Please bring medications and supplements to appointment,patient reminded of her face to face appointment on 03/23/21 at 130 pm.   Follow-Up:Pharmacist Review  Charlann Lange, [place] Pharmacist Assistant [phone]

## 2021-03-23 ENCOUNTER — Other Ambulatory Visit: Payer: Self-pay

## 2021-03-23 ENCOUNTER — Ambulatory Visit: Payer: Medicare Other | Admitting: Pharmacist

## 2021-03-23 DIAGNOSIS — E782 Mixed hyperlipidemia: Secondary | ICD-10-CM

## 2021-03-23 DIAGNOSIS — I159 Secondary hypertension, unspecified: Secondary | ICD-10-CM

## 2021-03-23 DIAGNOSIS — J4541 Moderate persistent asthma with (acute) exacerbation: Secondary | ICD-10-CM

## 2021-03-23 DIAGNOSIS — G43709 Chronic migraine without aura, not intractable, without status migrainosus: Secondary | ICD-10-CM

## 2021-03-23 NOTE — Patient Instructions (Addendum)
Visit Information  Goals Addressed            This Visit's Progress   . Lifestyle Change-Hyperlipidemia       Timeframe:  Long-Range Goal Priority:  High Start Date:  03/23/21                           Expected End Date:  09/23/21                     Follow Up Date 06/23/21   - agree on reward when goals are met - agree to work together to make changes    Why is this important?    The changes that you are asked to make may be hard to do.   This is especially true when the changes are life-long.   Knowing why it is important to you is the first step.   Working on the change with your family or support person helps you not feel alone.   Reward yourself and family or support person when goals are met. This can be an activity you choose like bowling, hiking, biking, swimming or shooting hoops.     Notes: Work to cut back on sweets on order to help cholesterol!!      Patient Care Plan: General Pharmacy (Adult)    Problem Identified: HTN,Asthma,HLD, Insomnia, migraine   Priority: High  Onset Date: 03/23/2021    Long-Range Goal: Patient-Specific Goal   Start Date: 03/23/2021  Expected End Date: 09/23/2021  This Visit's Progress: On track  Priority: High  Note:   Current Barriers:  . Unable to achieve control of lipids  . Suboptimal therapeutic regimen for cholesterol  Pharmacist Clinical Goal(s):  Marland Kitchen Patient will achieve control of cholesterol as evidenced by lipid panel . adhere to plan to optimize therapeutic regimen for HLD as evidenced by report of adherence to recommended medication management changes . contact provider office for questions/concerns as evidenced notation of same in electronic health record through collaboration with PharmD and provider.   Interventions: . 1:1 collaboration with Bonnie Guise, MD regarding development and update of comprehensive plan of care as evidenced by provider attestation and co-signature . Inter-disciplinary care team  collaboration (see longitudinal plan of care) . Comprehensive medication review performed; medication list updated in electronic medical record  Hypertension (BP goal <140/90) -Controlled -Current treatment: . Lisinopril 73m -Medications previously tried: amlodipine (d/c)  -Current home readings: 128-130/80s per patient, no logs at appontment -Current dietary habits: says she has been eating more sweets lately -Current exercise habits: minimal due to pain from recent fall -Denies hypotensive/hypertensive symptoms -Educated on BP goals and benefits of medications for prevention of heart attack, stroke and kidney damage; Daily salt intake goal < 2300 mg; Exercise goal of 150 minutes per week; Importance of home blood pressure monitoring; -Counseled to monitor BP at home daily, document, and provide log at future appointments -Recommended to continue current medication  Hyperlipidemia: (LDL goal < 100) -Uncontrolled -Current treatment: . Fenofibrate 1473mdaily -Medications previously tried: none noted  -Current dietary patterns: eating more sweets, drinks diet sodas -Current exercise habits: minimal exercise now -Educated on Cholesterol goals;  Benefits of statin for ASCVD risk reduction; Importance of limiting foods high in cholesterol; Reviewed most recent lipid panel and meaning of LDL/HDL/TG  -She reports she has tried statins in the past and had muscle aches, however I could not find it in her med history.  Recommended  she work on [employer] and have cholesterol re checked.  -Recommended to continue current medication If still elevated at next check, would recommend initiation of Rosuvastatin 89m daily.  If she cannot tolerate that I would accept every other day  Asthma (Goal: control symptoms and prevent exacerbations) -Controlled -Current treatment  . Atrovent HFA 141m . Flovent HFA 11025m. Ventolin HFA 59m65mrn . Albuterol 0.083% nebulizer solution -Medications  previously tried: Adviar   -Exacerbations requiring treatment in last 6 months: none -Patient reports consistent use of maintenance inhaler -Frequency of rescue inhaler use: nebulizers as needed, infrequently lately -Counseled on Proper inhaler technique; Benefits of consistent maintenance inhaler use When to use rescue inhaler Differences between maintenance and rescue inhalers  -She reports her breathing is as controlled as it has ever been recently.  Denies any restrictions in activities from breathing. -SOB only with exertion. -Recommended to continue current medication  Chronic migraines (Goal: minimize symptoms) -Controlled -Current treatment  . Midrin 65-100-325mg32mf the market) -Medications previously tried: Sumatriptan (hangover like effect) -Patient still has some left over Midrin that she is taking, however this has been taken off the market. -She still has occasional migraines and wants something to have on hand if she were to get one.  She mentions not liking the hangover effect of triptans. -Recommended possibility of Fioricet to have as emergency.  Will consult with NP at next visit     Patient Goals/Self-Care Activities . Patient will:  - take medications as prescribed check blood pressure dailyy, document, and provide at future appointments engage in dietary modifications by cutting back on sweets in diet to work on cholesterol  Follow Up Plan: The care management team will reach out to the patient again over the next 90 days.        Bonnie West information about Chronic Care Management services today including:  1. CCM service includes personalized support from designated clinical staff supervised by her physician, including individualized plan of care and coordination with other care providers 2. 24/7 contact phone numbers for assistance for urgent and routine care needs. 3. Standard insurance, coinsurance, copays and deductibles apply for chronic  care management only during months in which we provide at least 20 minutes of these services. Most insurances cover these services at 100%, however patients may be responsible for any copay, coinsurance and/or deductible if applicable. This service may help you avoid the need for more expensive face-to-face services. 4. Only one practitioner may furnish and bill the service in a calendar month. 5. The patient may stop CCM services at any time (effective at the end of the month) by phone call to the office staff.  Patient agreed to services and verbal consent obtained.   The patient verbalized understanding of instructions, educational materials, and care plan provided today and agreed to receive a mailed copy of patient instructions, educational materials, and care plan.  Telephone follow up appointment with pharmacy team member scheduled for: [id number]  Bonnie West  [facility] Dyslipidemia is an imbalance of waxy, fat-like substances (lipids) in the blood. The body needs lipids in small amounts. Dyslipidemia often involves a high level of cholesterol or triglycerides, which are types of lipids. Common forms of dyslipidemia include:  High levels of LDL cholesterol. LDL is the type of cholesterol that causes fatty deposits (plaques) to build up in the blood vessels that carry blood away from your heart (arteries).  Low levels of HDL cholesterol. HDL cholesterol is the type  of cholesterol that protects against heart disease. High levels of HDL remove the LDL buildup from arteries.  High levels of triglycerides. Triglycerides are a fatty substance in the blood that is linked to a buildup of plaques in the arteries. What are the causes? Primary dyslipidemia is caused by changes (mutations) in genes that are passed down through families (inherited). These mutations cause several types of dyslipidemia. Secondary dyslipidemia is caused by lifestyle choices and diseases that lead to  dyslipidemia, such as:  Eating a diet that is high in animal fat.  Not getting enough exercise.  Having diabetes, kidney disease, liver disease, or thyroid disease.  Drinking large amounts of alcohol.  Using certain medicines. What increases the risk? You are more likely to develop this condition if you are an older man or if you are a woman who has gone through menopause. Other risk factors include:  Having a family history of dyslipidemia.  Taking certain medicines, including birth control pills, steroids, some diuretics, and beta-blockers.  Smoking cigarettes.  Eating a high-fat diet.  Having certain medical conditions such as diabetes, polycystic ovary syndrome (PCOS), kidney disease, liver disease, or hypothyroidism.  Not exercising regularly.  Being overweight or obese with too much belly fat. What are the signs or symptoms? In most cases, dyslipidemia does not usually cause any symptoms. In severe cases, very high lipid levels can cause:  Fatty bumps under the skin (xanthomas).  White or gray ring around the black center (pupil) of the eye. Very high triglyceride levels can cause inflammation of the pancreas (pancreatitis). How is this diagnosed? Your health care provider may diagnose dyslipidemia based on a routine blood test (fasting blood test). Because most people do not have symptoms of the condition, this blood testing (lipid profile) is done on adults age 32 and older and is repeated every 5 years. This test checks:  Total cholesterol. This measures the total amount of cholesterol in your blood, including LDL cholesterol, HDL cholesterol, and triglycerides. A healthy number is below 200.  LDL cholesterol. The target number for LDL cholesterol is different for each person, depending on individual risk factors. Ask your health care provider what your LDL cholesterol should be.  HDL cholesterol. An HDL level of 60 or higher is best because it helps to protect  against heart disease. A number below 70 for men or below 66 for women increases the risk for heart disease.  Triglycerides. A healthy triglyceride number is below 150. If your lipid profile is abnormal, your health care provider may do other blood tests.   How is this treated? Treatment depends on the type of dyslipidemia that you have and your other risk factors for heart disease and stroke. Your health care provider will have a target range for your lipid levels based on this information. For many people, this condition may be treated by lifestyle changes, such as diet and exercise. Your health care provider may recommend that you:  Get regular exercise.  Make changes to your diet.  Quit smoking if you smoke. If diet changes and exercise do not help you reach your goals, your health care provider may also prescribe medicine to lower lipids. The most commonly prescribed type of medicine lowers your LDL cholesterol (statin drug). If you have a high triglyceride level, your provider may prescribe another type of drug (fibrate) or an omega-3 fish oil supplement, or both. Follow these instructions at home: Eating and drinking  Follow instructions from your health care provider or dietitian about  eating or drinking restrictions.  Eat a healthy diet as told by your health care provider. This can help you reach and maintain a healthy weight, lower your LDL cholesterol, and raise your HDL cholesterol. This may include: ? Limiting your calories, if you are overweight. ? Eating more fruits, vegetables, whole grains, fish, and lean meats. ? Limiting saturated fat, trans fat, and cholesterol.  If you drink alcohol: ? Limit how much you use. ? Be aware of how much alcohol is in your drink. In the [place] one drink equals one 12 oz bottle of beer (355 mL), one 5 oz glass of wine (148 mL), or one 1 oz glass of hard liquor (44 mL).  Do not drink alcohol if: ? Your health care provider tells you not to  drink. ? You are pregnant, may be pregnant, or are planning to become pregnant. Activity  Get regular exercise. Start an exercise and strength training program as told by your health care provider. Ask your health care provider what activities are safe for you. Your health care provider may recommend: ? 30 minutes of aerobic activity 4-6 days a week. Brisk walking is an example of aerobic activity. ? Strength training 2 days a week. General instructions  Do not use any products that contain nicotine or tobacco, such as cigarettes, e-cigarettes, and chewing tobacco. If you need help quitting, ask your health care provider.  Take over-the-counter and prescription medicines only as told by your health care provider. This includes supplements.  Keep all follow-up visits as told by your health care provider.   Contact a health care provider if:  You are: ? Having trouble sticking to your exercise or diet plan. ? Struggling to quit smoking or control your use of alcohol. Summary  Dyslipidemia often involves a high level of cholesterol or triglycerides, which are types of lipids.  Treatment depends on the type of dyslipidemia that you have and your other risk factors for heart disease and stroke.  For many people, treatment starts with lifestyle changes, such as diet and exercise.  Your health care provider may prescribe medicine to lower lipids. This information is not intended to replace advice given to you by your health care provider. Make sure you discuss any questions you have with your health care provider. Document Revised: 07/02/2018 Document Reviewed: 06/08/2018 Elsevier Patient Education  [place].

## 2021-03-24 ENCOUNTER — Encounter: Payer: Self-pay | Admitting: Hospice and Palliative Medicine

## 2021-03-31 DIAGNOSIS — H1132 Conjunctival hemorrhage, left eye: Secondary | ICD-10-CM | POA: Diagnosis not present

## 2021-04-07 DIAGNOSIS — H5005 Alternating esotropia: Secondary | ICD-10-CM | POA: Diagnosis not present

## 2021-04-07 DIAGNOSIS — H50312 Intermittent monocular esotropia, left eye: Secondary | ICD-10-CM | POA: Diagnosis not present

## 2021-04-07 DIAGNOSIS — Z961 Presence of intraocular lens: Secondary | ICD-10-CM | POA: Diagnosis not present

## 2021-04-07 DIAGNOSIS — H532 Diplopia: Secondary | ICD-10-CM | POA: Diagnosis not present

## 2021-04-07 DIAGNOSIS — H5089 Other specified strabismus: Secondary | ICD-10-CM | POA: Diagnosis not present

## 2021-04-07 DIAGNOSIS — G44009 Cluster headache syndrome, unspecified, not intractable: Secondary | ICD-10-CM | POA: Diagnosis not present

## 2021-04-09 DIAGNOSIS — H532 Diplopia: Secondary | ICD-10-CM | POA: Diagnosis not present

## 2021-04-09 DIAGNOSIS — R519 Headache, unspecified: Secondary | ICD-10-CM | POA: Diagnosis not present

## 2021-04-09 DIAGNOSIS — H5005 Alternating esotropia: Secondary | ICD-10-CM | POA: Diagnosis not present

## 2021-04-09 DIAGNOSIS — H5089 Other specified strabismus: Secondary | ICD-10-CM | POA: Diagnosis not present

## 2021-04-17 ENCOUNTER — Other Ambulatory Visit: Payer: Self-pay | Admitting: Adult Health

## 2021-04-17 DIAGNOSIS — J4540 Moderate persistent asthma, uncomplicated: Secondary | ICD-10-CM

## 2021-04-20 ENCOUNTER — Ambulatory Visit (INDEPENDENT_AMBULATORY_CARE_PROVIDER_SITE_OTHER): Payer: Medicare Other | Admitting: Nurse Practitioner

## 2021-04-20 ENCOUNTER — Other Ambulatory Visit: Payer: Self-pay

## 2021-04-20 ENCOUNTER — Encounter: Payer: Self-pay | Admitting: Nurse Practitioner

## 2021-04-20 DIAGNOSIS — E039 Hypothyroidism, unspecified: Secondary | ICD-10-CM | POA: Diagnosis not present

## 2021-04-20 DIAGNOSIS — J4540 Moderate persistent asthma, uncomplicated: Secondary | ICD-10-CM | POA: Diagnosis not present

## 2021-04-20 DIAGNOSIS — I10 Essential (primary) hypertension: Secondary | ICD-10-CM | POA: Diagnosis not present

## 2021-04-20 DIAGNOSIS — F411 Generalized anxiety disorder: Secondary | ICD-10-CM

## 2021-04-20 DIAGNOSIS — K219 Gastro-esophageal reflux disease without esophagitis: Secondary | ICD-10-CM | POA: Diagnosis not present

## 2021-04-20 DIAGNOSIS — Z79899 Other long term (current) drug therapy: Secondary | ICD-10-CM | POA: Diagnosis not present

## 2021-04-20 DIAGNOSIS — M545 Low back pain, unspecified: Secondary | ICD-10-CM

## 2021-04-20 DIAGNOSIS — L719 Rosacea, unspecified: Secondary | ICD-10-CM

## 2021-04-20 LAB — POCT URINE DRUG SCREEN
Methylenedioxyamphetamine: NOT DETECTED
POC Amphetamine UR: NOT DETECTED
POC BENZODIAZEPINES UR: POSITIVE — AB
POC Barbiturate UR: NOT DETECTED
POC Cocaine UR: NOT DETECTED
POC Ecstasy UR: NOT DETECTED
POC Marijuana UR: NOT DETECTED
POC Methadone UR: NOT DETECTED
POC Methamphetamine UR: NOT DETECTED
POC Opiate Ur: NOT DETECTED
POC Oxycodone UR: NOT DETECTED
POC PHENCYCLIDINE UR: NOT DETECTED
POC TRICYCLICS UR: POSITIVE — AB

## 2021-04-20 MED ORDER — BACLOFEN 10 MG PO TABS: ORAL_TABLET | ORAL | 0 refills | Status: DC

## 2021-04-20 MED ORDER — ALBUTEROL SULFATE HFA 108 (90 BASE) MCG/ACT IN AERS: INHALATION_SPRAY | RESPIRATORY_TRACT | 5 refills | Status: DC

## 2021-04-20 MED ORDER — ALPRAZOLAM 0.25 MG PO TABS: 0.2500 mg | ORAL_TABLET | Freq: Two times a day (BID) | ORAL | 1 refills | Status: DC | PRN

## 2021-04-20 MED ORDER — HYDROCODONE-ACETAMINOPHEN 5-300 MG PO TABS: ORAL_TABLET | ORAL | 0 refills | Status: DC

## 2021-04-20 MED ORDER — CLINDAMYCIN PHOSPHATE 1 % EX SOLN: 1.0000 "application " | Freq: Two times a day (BID) | CUTANEOUS | 2 refills | Status: DC | PRN

## 2021-04-20 MED ORDER — FLUOXETINE HCL 40 MG PO CAPS: 40.0000 mg | ORAL_CAPSULE | Freq: Every day | ORAL | 1 refills | Status: DC

## 2021-04-20 NOTE — Progress Notes (Signed)
[facility] [place], [place] [address]  Internal MEDICINE  Office Visit Note  Patient Name: Bonnie West  [id number]  [id number]  Date of Service: 04/23/2021  Chief Complaint  Patient presents with  . Follow-up    Refills, review results, back pain still, if pt walks for a little she ends up in pain  . Depression  . Gastroesophageal Reflux  . Hyperlipidemia  . Sleep Apnea  . Hypertension  . Anxiety  . Asthma  . Quality Metric Gaps    Shingrix    HPI Bonnie West presents for a follow-up visit for refills, to discuss results, and back pain.  She had an x-ray done of her lumbar spine which showed diffuse thoracolumbar spine spondylosis, likely causing her chronic low back pain.  She had labs done in April 2022.  Her metabolic panel is significant for hypercalcemia at 10.4.  Her CBC is significant for slight macrocytosis without anemia at 98.  Her TSH and T4 were normal.  Her lipid profile was abnormal.  Her total cholesterol was 203, triglycerides were 158, her LDL was 127 and her HDL was 48.  VLDL was normal at 28.  Autoimmune labs were done as well, including CRP, ESR, myasthenia gravis eval, and muscle specific kinase.  These tests were all within normal limits.  Estimated 10-year ASCVD risk is 22%. -Her blood pressure is 162/86 today.  She is taking lisinopril 10 mg daily for her hypertension.  She is on no other antihypertensive medications at this time. -Depression and anxiety stable with current medications. -Gastroesophageal reflux stable with current medications. -Obstructive sleep apnea, patient using CPAP, states it is improving. -Asthma is controlled with current medications.   Current Medication: Outpatient Encounter Medications as of 04/20/2021  Medication Sig  . acetaminophen (TYLENOL) 500 MG tablet Take 1,000 mg by mouth 2 (two) times daily as needed for mild pain or moderate pain.  Marland Kitchen albuterol (PROVENTIL) (2.5 MG/3ML) 0.083% nebulizer solution  Take 3 mLs (2.5 mg total) by nebulization every 6 (six) hours as needed for wheezing or shortness of breath.  Marland Kitchen albuterol (VENTOLIN HFA) 108 (90 Base) MCG/ACT inhaler INHALE 2 PUFFS BY MOUTH EVERY 6 HOURS  . ALPRAZolam (XANAX) 0.25 MG tablet Take 1 tablet (0.25 mg total) by mouth 2 (two) times daily as needed for anxiety.  . baclofen (LIORESAL) 10 MG tablet TAKE 1 TABLET BY MOUTH TWICE A DAY AS NEEDED BACK PAIN  . bisacodyl (DULCOLAX) 5 MG EC tablet Take 5-10 mg by mouth at bedtime as needed for moderate constipation. Two at night  . cetirizine (ZYRTEC) 10 MG tablet Take 10 mg by mouth daily.  . cholecalciferol (VITAMIN D) 1000 units tablet Take 1,000 Units by mouth at bedtime.  . clindamycin (CLEOCIN T) 1 % external solution Apply 1 application topically 2 (two) times daily as needed. For skin breakdown  . docusate sodium (COLACE) 100 MG capsule Take 100-200 mg by mouth at bedtime as needed for mild constipation or moderate constipation.   Marland Kitchen estradiol (ESTRACE) 0.1 MG/GM vaginal cream INSERT 1 GRAM VAGINALLY TWICE WEEKLY AT BEDTIME  . fenofibrate (TRICOR) 145 MG tablet Take 1 tablet (145 mg total) by mouth daily.  Marland Kitchen FLUoxetine (PROZAC) 40 MG capsule Take 1 capsule (40 mg total) by mouth daily.  . fluticasone (FLOVENT HFA) 110 MCG/ACT inhaler Use 2 puffs inhaled twice dilay  . HYDROcodone-Acetaminophen 5-300 MG TABS Take one tab po bid for back pain  . ipratropium (ATROVENT HFA) 17 MCG/ACT inhaler INHALE  2 PUFFS 4 TIMES DAILY  . ipratropium (ATROVENT) 0.02 % nebulizer solution Use 1 vial nebulized QID prn shortness of breath/wheezing.  . isometheptene-acetaminophen-dichloralphenazone (MIDRIN) 65-100-325 MG capsule Take 1 capsule by mouth 4 (four) times daily as needed for migraine. Maximum 5 capsules in 12 hours for migraine headaches, 8 capsules in 24 hours for tension headaches.  . levothyroxine (SYNTHROID) 100 MCG tablet TAKE 1 TABLET BY MOUTH EVERY DAY IN THE MORNING  . lisinopril (ZESTRIL) 10  MG tablet TAKE 1 TABLET BY MOUTH EVERY DAY  . magnesium oxide (MAG-OX) 400 MG tablet Take 400 mg by mouth at bedtime.   . montelukast (SINGULAIR) 10 MG tablet Take 1 tablet (10 mg total) by mouth daily.  Marland Kitchen omeprazole (PRILOSEC) 40 MG capsule TAKE 1 CAPSULE BY MOUTH EVERY DAY  . polyethylene glycol powder (GLYCOLAX/MIRALAX) 17 GM/SCOOP powder Take 17 g by mouth every evening.  . triamcinolone cream (KENALOG) 0.1 % Apply 1 application topically 2 (two) times daily as needed. For skin breakdown  . vitamin B-12 (CYANOCOBALAMIN) 1000 MCG tablet Take 1,000 mcg by mouth daily.  . [DISCONTINUED] albuterol (VENTOLIN HFA) 108 (90 Base) MCG/ACT inhaler INHALE 2 PUFFS BY MOUTH EVERY 6 HOURS  . [DISCONTINUED] ALPRAZolam (XANAX) 0.25 MG tablet Take 1 tablet (0.25 mg total) by mouth 2 (two) times daily as needed for anxiety.  . [DISCONTINUED] baclofen (LIORESAL) 10 MG tablet TAKE 1 TABLET BY MOUTH TWICE A DAY AS NEEDED BACK PAIN  . [DISCONTINUED] clindamycin (CLEOCIN T) 1 % external solution Apply 1 application topically 2 (two) times daily as needed. For skin breakdown  . [DISCONTINUED] FLUoxetine (PROZAC) 40 MG capsule Take 1 capsule (40 mg total) by mouth daily.  . [DISCONTINUED] HYDROcodone-Acetaminophen 5-300 MG TABS Take one tab po bid for back pain   No facility-administered encounter medications on file as of 04/20/2021.    Surgical History: Past Surgical History:  Procedure Laterality Date  . BUNIONECTOMY    . CATARACT EXTRACTION W/PHACO Right 10/11/2016   Procedure: CATARACT EXTRACTION PHACO AND INTRAOCULAR LENS PLACEMENT (IOC);  Surgeon: Birder Robson, MD;  Location: [facility] ORS;  Service: Ophthalmology;  Laterality: Right;  Lot# [id number] H [place]: 00:37.7 AP%: 18.1 CDE: 6.80  . CATARACT EXTRACTION W/PHACO Left 11/08/2016   Procedure: CATARACT EXTRACTION PHACO AND INTRAOCULAR LENS PLACEMENT (IOC);  Surgeon: Birder Robson, MD;  Location: [facility] ORS;  Service: Ophthalmology;  Laterality: Left;  PACK  LOT: [id number] H US:00:32 AP:44 CDE:6.46  . COLONOSCOPY    . COLONOSCOPY WITH PROPOFOL N/A 11/26/2015   Procedure: COLONOSCOPY WITH PROPOFOL;  Surgeon: Manya Silvas, MD;  Location: [facility] ENDOSCOPY;  Service: Endoscopy;  Laterality: N/A;  . EYE SURGERY    . TONSILLECTOMY      Medical History: Past Medical History:  Diagnosis Date  . Allergic rhinitis   . Anxiety   . Asthma   . Blood transfusion without reported diagnosis   . Depression   . Diverticulosis   . Dyspnea   . Esophagitis   . GERD (gastroesophageal reflux disease)   . Headache   . Heart murmur   . Hyperlipemia   . Hypertension   . Hypothyroidism   . Obesity   . Osteopenia   . Palpitations   . Pneumothorax   . Sleep apnea     Family History: Family History  Problem Relation Age of Onset  . Breast cancer Maternal Grandmother   . Stroke Maternal Grandmother   . Bladder Cancer Father   . Prostate cancer Father   . Heart attack  Father   . Aortic aneurysm Father   . Congestive Heart Failure Father   . Colon cancer Paternal Grandmother   . Aneurysm Paternal Grandmother   . Stroke Paternal Grandmother   . Congestive Heart Failure Paternal Grandmother   . Dementia Mother   . Stroke Mother   . Dementia Maternal Grandfather   . Stroke Maternal Grandfather   . Dementia Paternal Grandfather   . Stroke Paternal Grandfather     Social History   Socioeconomic History  . Marital status: Married    Spouse name: Not on file  . Number of children: Not on file  . Years of education: Not on file  . Highest education level: Not on file  Occupational History  . Not on file  Tobacco Use  . Smoking status: Former Research scientist (life sciences)  . Smokeless tobacco: Never Used  Vaping Use  . Vaping Use: Never used  Substance and Sexual Activity  . Alcohol use: No  . Drug use: No  . Sexual activity: Not Currently    Partners: Male    Birth control/protection: Post-menopausal  Other Topics Concern  . Not on file  Social History  Narrative  . Not on file   Social Determinants of Health   Financial Resource Strain: Low Risk   . Difficulty of Paying Living Expenses: Not very hard  Food Insecurity: Not on file  Transportation Needs: Not on file  Physical Activity: Not on file  Stress: Not on file  Social Connections: Not on file  Intimate Partner Violence: Not on file      Review of Systems  Constitutional: Negative for chills, fatigue and unexpected weight change.  HENT: Negative for congestion, rhinorrhea, sneezing and sore throat.   Eyes: Negative for redness.  Respiratory: Negative for cough, chest tightness and shortness of breath.   Cardiovascular: Negative for chest pain and palpitations.  Gastrointestinal: Negative for abdominal pain, constipation, diarrhea, nausea and vomiting.  Genitourinary: Negative for dysuria and frequency.  Musculoskeletal: Negative for arthralgias, back pain, joint swelling and neck pain.  Skin: Negative for rash.  Neurological: Negative.  Negative for tremors and numbness.  Hematological: Negative for adenopathy. Does not bruise/bleed easily.  Psychiatric/Behavioral: Negative for behavioral problems (Depression), sleep disturbance and suicidal ideas. The patient is not nervous/anxious.     Vital Signs: BP (!) 162/86   Pulse 70   Temp (!) 97.4 F (36.3 C)   Resp 16   Ht 5' 1"  (1.549 m)   Wt 151 lb 9.6 oz (68.8 kg)   SpO2 98%   BMI 28.64 kg/m    Physical Exam Vitals reviewed.  Constitutional:      Appearance: Normal appearance. She is overweight.  HENT:     Head: Normocephalic and atraumatic.  Cardiovascular:     Rate and Rhythm: Normal rate and regular rhythm.     Pulses: Normal pulses.     Heart sounds: Normal heart sounds.  Pulmonary:     Effort: Pulmonary effort is normal. No respiratory distress.     Breath sounds: Wheezing (mild, expiratory bilateral upper lobe) present.  Skin:    General: Skin is warm and dry.     Capillary Refill: Capillary  refill takes less than 2 seconds.  Neurological:     Mental Status: She is alert and oriented to person, place, and time.  Psychiatric:        Mood and Affect: Mood normal.        Behavior: Behavior normal.    Assessment/Plan: 1. Acute midline  low back pain without sciatica Xray showed no acute changes. Pain medication refilled for 20 tablets. No refillsafterward - HYDROcodone-Acetaminophen 5-300 MG TABS; Take one tab po bid for back pain  Dispense: 20 tablet; Refill: 0  2. Moderate persistent asthma without status asthmaticus without complication Continue medications as ordered,  - albuterol (VENTOLIN HFA) 108 (90 Base) MCG/ACT inhaler; INHALE 2 PUFFS BY MOUTH EVERY 6 HOURS  Dispense: 18 g; Refill: 5  3. GAD (generalized anxiety disorder) Stable with current medications, continue as prescribed, refill ordered.  - FLUoxetine (PROZAC) 40 MG capsule; Take 1 capsule (40 mg total) by mouth daily.  Dispense: 90 capsule; Refill: 1 - ALPRAZolam (XANAX) 0.25 MG tablet; Take 1 tablet (0.25 mg total) by mouth 2 (two) times daily as needed for anxiety.  Dispense: 60 tablet; Refill: 1  4. Rosacea, acne Refill ordered as requested, patient uses medication for rosacea - clindamycin (CLEOCIN T) 1 % external solution; Apply 1 application topically 2 (two) times daily as needed. For skin breakdown  Dispense: 30 mL; Refill: 2  5. Encounter for long-term (current) use of high-risk medication Patient taking benzodiazeprines, urine drug screen results is consistent with prescribed medication. - POCT Urine Drug Screen   General Counseling: Abraham verbalizes understanding of the findings of todays visit and agrees with plan of treatment. I have discussed any further diagnostic evaluation that may be needed or ordered today. We also reviewed her medications today. she has been encouraged to call the office with any questions or concerns that should arise related to todays visit.    Orders Placed This  Encounter  Procedures  . POCT Urine Drug Screen    Meds ordered this encounter  Medications  . baclofen (LIORESAL) 10 MG tablet    Sig: TAKE 1 TABLET BY MOUTH TWICE A DAY AS NEEDED BACK PAIN    Dispense:  30 tablet    Refill:  0  . clindamycin (CLEOCIN T) 1 % external solution    Sig: Apply 1 application topically 2 (two) times daily as needed. For skin breakdown    Dispense:  30 mL    Refill:  2    PATIENT REQUEST FOR ADDITIONAL REFILLS  . albuterol (VENTOLIN HFA) 108 (90 Base) MCG/ACT inhaler    Sig: INHALE 2 PUFFS BY MOUTH EVERY 6 HOURS    Dispense:  18 g    Refill:  5  . FLUoxetine (PROZAC) 40 MG capsule    Sig: Take 1 capsule (40 mg total) by mouth daily.    Dispense:  90 capsule    Refill:  1  . HYDROcodone-Acetaminophen 5-300 MG TABS    Sig: Take one tab po bid for back pain    Dispense:  20 tablet    Refill:  0  . ALPRAZolam (XANAX) 0.25 MG tablet    Sig: Take 1 tablet (0.25 mg total) by mouth 2 (two) times daily as needed for anxiety.    Dispense:  60 tablet    Refill:  1    Return in about 3 months (around 07/21/2021) for F/U, med refill, Cauy Melody PCP.   Total time spent:30 Minutes Time spent includes review of chart, medications, test results, and follow up plan with the patient.   [place] Controlled Substance Database was reviewed by me.  This patient was seen by [name], FNP-C in collaboration with Dr. Clayborn Bigness as a part of collaborative care agreement.   Nate Common R. Valetta Fuller, MSN, FNP-C Internal medicine

## 2021-04-21 DIAGNOSIS — H518 Other specified disorders of binocular movement: Secondary | ICD-10-CM | POA: Diagnosis not present

## 2021-04-21 DIAGNOSIS — Z961 Presence of intraocular lens: Secondary | ICD-10-CM | POA: Diagnosis not present

## 2021-04-21 DIAGNOSIS — H5005 Alternating esotropia: Secondary | ICD-10-CM | POA: Diagnosis not present

## 2021-04-22 ENCOUNTER — Other Ambulatory Visit: Payer: Self-pay | Admitting: Nurse Practitioner

## 2021-04-22 DIAGNOSIS — J4540 Moderate persistent asthma, uncomplicated: Secondary | ICD-10-CM

## 2021-04-28 ENCOUNTER — Other Ambulatory Visit: Payer: Self-pay

## 2021-05-05 ENCOUNTER — Telehealth: Payer: Self-pay | Admitting: Pharmacist

## 2021-05-05 NOTE — Progress Notes (Addendum)
Chronic Care Management Pharmacy Assistant   Name: Bonnie West  MRN: [id number] DOB: December 03, 1950  Reason for Encounter: Disease State For CHL.    Conditions to be addressed/monitored: HTN,Asthma,HLD, Insomnia  Recent office visits:  04/20/21 [name], NP. For follow-up. No medication changes.   Recent consult visits:  04/21/21 Telemedicine Ophthalmology [place], Eden Lathe III, MD. No information given.  04/07/21 Ophthalmology [place], Eden Lathe III, MD. For follow-up. No information given.  Hospital visits:  None since 03/23/21  Medications: Outpatient Encounter Medications as of 05/05/2021  Medication Sig   acetaminophen (TYLENOL) 500 MG tablet Take 1,000 mg by mouth 2 (two) times daily as needed for mild pain or moderate pain.   albuterol (PROVENTIL) (2.5 MG/3ML) 0.083% nebulizer solution Take 3 mLs (2.5 mg total) by nebulization every 6 (six) hours as needed for wheezing or shortness of breath.   albuterol (VENTOLIN HFA) 108 (90 Base) MCG/ACT inhaler INHALE 2 PUFFS BY MOUTH EVERY 6 HOURS   ALPRAZolam (XANAX) 0.25 MG tablet Take 1 tablet (0.25 mg total) by mouth 2 (two) times daily as needed for anxiety.   baclofen (LIORESAL) 10 MG tablet TAKE 1 TABLET BY MOUTH TWICE A DAY AS NEEDED BACK PAIN   bisacodyl (DULCOLAX) 5 MG EC tablet Take 5-10 mg by mouth at bedtime as needed for moderate constipation. Two at night   cetirizine (ZYRTEC) 10 MG tablet Take 10 mg by mouth daily.   cholecalciferol (VITAMIN D) 1000 units tablet Take 1,000 Units by mouth at bedtime.   clindamycin (CLEOCIN T) 1 % external solution Apply 1 application topically 2 (two) times daily as needed. For skin breakdown   docusate sodium (COLACE) 100 MG capsule Take 100-200 mg by mouth at bedtime as needed for mild constipation or moderate constipation.    estradiol (ESTRACE) 0.1 MG/GM vaginal cream INSERT 1 GRAM VAGINALLY TWICE WEEKLY AT BEDTIME   fenofibrate (TRICOR) 145 MG tablet Take 1 tablet (145 mg  total) by mouth daily.   FLUoxetine (PROZAC) 40 MG capsule Take 1 capsule (40 mg total) by mouth daily.   fluticasone (FLOVENT HFA) 110 MCG/ACT inhaler Use 2 puffs inhaled twice dilay   HYDROcodone-Acetaminophen 5-300 MG TABS Take one tab po bid for back pain   ipratropium (ATROVENT HFA) 17 MCG/ACT inhaler INHALE 2 PUFFS 4 TIMES DAILY   ipratropium (ATROVENT) 0.02 % nebulizer solution Use 1 vial nebulized QID prn shortness of breath/wheezing.   isometheptene-acetaminophen-dichloralphenazone (MIDRIN) 65-100-325 MG capsule Take 1 capsule by mouth 4 (four) times daily as needed for migraine. Maximum 5 capsules in 12 hours for migraine headaches, 8 capsules in 24 hours for tension headaches.   levothyroxine (SYNTHROID) 100 MCG tablet TAKE 1 TABLET BY MOUTH EVERY DAY IN THE MORNING   lisinopril (ZESTRIL) 10 MG tablet TAKE 1 TABLET BY MOUTH EVERY DAY   magnesium oxide (MAG-OX) 400 MG tablet Take 400 mg by mouth at bedtime.    montelukast (SINGULAIR) 10 MG tablet Take 1 tablet (10 mg total) by mouth daily.   omeprazole (PRILOSEC) 40 MG capsule TAKE 1 CAPSULE BY MOUTH EVERY DAY   polyethylene glycol powder (GLYCOLAX/MIRALAX) 17 GM/SCOOP powder Take 17 g by mouth every evening.   triamcinolone cream (KENALOG) 0.1 % Apply 1 application topically 2 (two) times daily as needed. For skin breakdown   vitamin B-12 (CYANOCOBALAMIN) 1000 MCG tablet Take 1,000 mcg by mouth daily.   No facility-administered encounter medications on file as of 05/05/2021.    05/05/2021 Name: Bonnie West MRN: [id number]  DOB: [birth year] Bonnie West is a 70 y.o. year old female who is a primary care patient of Lavera Guise, MD.  Comprehensive medication review performed; Spoke to patient regarding cholesterol  Lipid Panel    Component Value Date/Time   CHOL 203 (H) 02/19/2021 1159   TRIG 158 (H) 02/19/2021 1159   HDL 48 02/19/2021 1159   LDLCALC 127 (H) 02/19/2021 1159    10-year ASCVD risk score: The 10-year ASCVD risk  score Mikey Bussing DC Brooke Bonito., et al., 2013) is: 22%   Values used to calculate the score:     Age: 5 years     Sex: Female     Is Non-Hispanic African American: No     Diabetic: No     Tobacco smoker: No     Systolic Blood Pressure: 384 mmHg     Is BP treated: Yes     HDL Cholesterol: 48 mg/dL     Total Cholesterol: 203 mg/dL  Current antihyperlipidemic regimen:  Fenofibrate 145mg  daily  Previous antihyperlipidemic medications tried: None noted.   ASCVD risk enhancing conditions: age >46 and HTN  What recent interventions/DTPs have been made by any provider to improve Cholesterol control since last CPP Visit: None.  Any recent hospitalizations or ED visits since last visit with CPP? Patient stated no.   What diet changes have been made to improve Cholesterol?  Patient stated she hasn't changed much of his diet. We did discuss drinking more water.   What exercise is being done to improve Cholesterol?  Patient stated she has started walking more daily.  Adherence Review: Does the patient have >5 day gap between last estimated fill dates?   Star Rating Drugs: Lisinopril 10 mg 90 DS 02/21/21  Patient stated she does not have any questions or concerns about her medications at this time.   Follow-Up:Pharmacist Review  Charlann Lange, RMA Clinical Pharmacist Assistant [phone]  10 minutes spent in review, coordination, and documentation.  Reviewed by: Beverly Milch, PharmD Clinical Pharmacist [facility] [phone]

## 2021-05-12 ENCOUNTER — Other Ambulatory Visit: Payer: Self-pay | Admitting: Adult Health

## 2021-05-12 DIAGNOSIS — J4540 Moderate persistent asthma, uncomplicated: Secondary | ICD-10-CM

## 2021-05-15 ENCOUNTER — Other Ambulatory Visit: Payer: Self-pay | Admitting: Nurse Practitioner

## 2021-05-15 DIAGNOSIS — J4540 Moderate persistent asthma, uncomplicated: Secondary | ICD-10-CM

## 2021-05-20 DIAGNOSIS — E039 Hypothyroidism, unspecified: Secondary | ICD-10-CM | POA: Diagnosis not present

## 2021-05-20 DIAGNOSIS — I10 Essential (primary) hypertension: Secondary | ICD-10-CM | POA: Diagnosis not present

## 2021-05-20 DIAGNOSIS — K219 Gastro-esophageal reflux disease without esophagitis: Secondary | ICD-10-CM | POA: Diagnosis not present

## 2021-05-28 ENCOUNTER — Other Ambulatory Visit: Payer: Self-pay

## 2021-05-28 MED ORDER — BACLOFEN 10 MG PO TABS: ORAL_TABLET | ORAL | 0 refills | Status: DC

## 2021-06-09 ENCOUNTER — Other Ambulatory Visit: Payer: Self-pay | Admitting: Nurse Practitioner

## 2021-06-09 ENCOUNTER — Other Ambulatory Visit: Payer: Self-pay

## 2021-06-13 ENCOUNTER — Other Ambulatory Visit: Payer: Self-pay | Admitting: Nurse Practitioner

## 2021-06-13 DIAGNOSIS — J4540 Moderate persistent asthma, uncomplicated: Secondary | ICD-10-CM

## 2021-06-14 ENCOUNTER — Telehealth: Payer: Self-pay

## 2021-06-23 ENCOUNTER — Other Ambulatory Visit: Payer: Self-pay

## 2021-06-23 ENCOUNTER — Ambulatory Visit: Payer: Medicare Other | Admitting: Pharmacist

## 2021-06-23 ENCOUNTER — Other Ambulatory Visit: Payer: Self-pay | Admitting: Nurse Practitioner

## 2021-06-23 ENCOUNTER — Other Ambulatory Visit: Payer: Self-pay | Admitting: Internal Medicine

## 2021-06-23 ENCOUNTER — Telehealth: Payer: Self-pay

## 2021-06-23 DIAGNOSIS — E782 Mixed hyperlipidemia: Secondary | ICD-10-CM

## 2021-06-23 DIAGNOSIS — I159 Secondary hypertension, unspecified: Secondary | ICD-10-CM

## 2021-06-23 DIAGNOSIS — F411 Generalized anxiety disorder: Secondary | ICD-10-CM

## 2021-06-23 MED ORDER — ALPRAZOLAM 0.25 MG PO TABS: 0.2500 mg | ORAL_TABLET | Freq: Two times a day (BID) | ORAL | 0 refills | Status: DC | PRN

## 2021-06-23 NOTE — Patient Instructions (Addendum)
Visit Information   Goals Addressed             This Visit's Progress    Lifestyle Change-Hyperlipidemia       Timeframe:  Long-Range Goal Priority:  High Start Date:  03/23/21                           Expected End Date:  09/23/21                     Follow Up Date 06/23/21   - agree on reward when goals are met - agree to work together to make changes    Why is this important?   The changes that you are asked to make may be hard to do.  This is especially true when the changes are life-long.  Knowing why it is important to you is the first step.  Working on the change with your family or support person helps you not feel alone.  Reward yourself and family or support person when goals are met. This can be an activity you choose like bowling, hiking, biking, swimming or shooting hoops.     Notes: Work to cut back on sweets on order to help cholesterol!!  06/23/21 - still need updated lipids       Patient Care Plan: General Pharmacy (Adult)     Problem Identified: HTN,Asthma,HLD, Insomnia, migraine   Priority: High  Onset Date: 03/23/2021     Long-Range Goal: Patient-Specific Goal   Start Date: 03/23/2021  Expected End Date: 09/23/2021  Recent Progress: On track  Priority: High  Note:   Current Barriers:  Unable to achieve control of lipids  Suboptimal therapeutic regimen for cholesterol  Pharmacist Clinical Goal(s):  Patient will achieve control of cholesterol as evidenced by lipid panel adhere to plan to optimize therapeutic regimen for HLD as evidenced by report of adherence to recommended medication management changes contact provider office for questions/concerns as evidenced notation of same in electronic health record through collaboration with PharmD and provider.   Interventions: 1:1 collaboration with Lavera Guise, MD regarding development and update of comprehensive plan of care as evidenced by provider attestation and co-signature Inter-disciplinary  care team collaboration (see longitudinal plan of care) Comprehensive medication review performed; medication list updated in electronic medical record  Hypertension (BP goal <140/90) -Controlled -Current treatment: Lisinopril 63m -Medications previously tried: amlodipine (d/c)  -Current home readings: 128-130/80s per patient, no logs at appontment -Current dietary habits: says she has been eating more sweets lately -Current exercise habits: minimal due to pain from recent fall -Denies hypotensive/hypertensive symptoms -Educated on BP goals and benefits of medications for prevention of heart attack, stroke and kidney damage; Daily salt intake goal < 2300 mg; Exercise goal of 150 minutes per week; Importance of home blood pressure monitoring; -Counseled to monitor BP at home daily, document, and provide log at future appointments -Recommended to continue current medication  Update 06/23/21 Patient BP at home 130s/80s - I have asked her to monitor and record at least a few times per week so we can check in based off of last in office reading. Still no dizziness or HA's  Continue current meds - will continue to monitor  Hyperlipidemia: (LDL goal < 100) -Uncontrolled -Current treatment: Fenofibrate 1470mdaily -Medications previously tried: none noted  -Current dietary patterns: eating more sweets, drinks diet sodas -Current exercise habits: minimal exercise now -Educated on Cholesterol goals;  Benefits of statin  for ASCVD risk reduction; Importance of limiting foods high in cholesterol; Reviewed most recent lipid panel and meaning of LDL/HDL/TG  -She reports she has tried statins in the past and had muscle aches, however I could not find it in her med history.  Recommended she work on the diet and have cholesterol re checked.  -Recommended to continue current medication If still elevated at next check, would recommend initiation of Rosuvastatin 57m daily.  If she cannot tolerate  that I would accept every other day  Update 06/23/21 Still no updated lipids Would recheck at next OV - if still elevated could consider initiation of statin.  Asthma (Goal: control symptoms and prevent exacerbations) -Controlled -Current treatment  Atrovent HFA 141m Flovent HFA 11057mVentolin HFA 84m21mrn Albuterol 0.083% nebulizer solution -Medications previously tried: Adviar   -Exacerbations requiring treatment in last 6 months: none -Patient reports consistent use of maintenance inhaler -Frequency of rescue inhaler use: nebulizers as needed, infrequently lately -Counseled on Proper inhaler technique; Benefits of consistent maintenance inhaler use When to use rescue inhaler Differences between maintenance and rescue inhalers  -She reports her breathing is as controlled as it has ever been recently.  Denies any restrictions in activities from breathing. -SOB only with exertion. -Recommended to continue current medication  Chronic migraines (Goal: minimize symptoms) -Controlled -Current treatment  Midrin 65-100-325mg59mf the market) -Medications previously tried: Sumatriptan (hangover like effect) -Patient still has some left over Midrin that she is taking, however this has been taken off the market. -She still has occasional migraines and wants something to have on hand if she were to get one.  She mentions not liking the hangover effect of triptans. -Recommended possibility of Fioricet to have as emergency.  Will consult with NP at next visit   Patient Goals/Self-Care Activities Patient will:  - take medications as prescribed check blood pressure daily, document, and provide at future appointments engage in dietary modifications by cutting back on sweets in diet to work on cholesterol  Follow Up Plan: The care management team will reach out to the patient again over the next 90 days.        Patient verbalizes understanding of instructions provided today and  agrees to [place] follow up appointment with pharmacy team member scheduled for: 4 months  Bonnie West  [name]

## 2021-06-23 NOTE — Telephone Encounter (Signed)
LMOM refill was sent to pharmacy

## 2021-06-23 NOTE — Progress Notes (Signed)
Chronic Care Management Pharmacy Note  06/23/2021 Name:  Bonnie West MRN:  [id number] DOB:  29-Sep-1951  Subjective: Bonnie West is an 70 y.o. year old female who is a primary patient of Humphrey Rolls Timoteo Gaul, MD.  The CCM team was consulted for assistance with disease management and care coordination needs.    Engaged with patient by telephone for follow up visit in response to provider referral for pharmacy case management and/or care coordination services.   Consent to Services:  The patient was given the following information about Chronic Care Management services today, agreed to services, and gave verbal consent: 1. CCM service includes personalized support from designated clinical staff supervised by the primary care provider, including individualized plan of care and coordination with other care providers 2. 24/7 contact phone numbers for assistance for urgent and routine care needs. 3. Service will only be billed when office clinical staff spend 20 minutes or more in a month to coordinate care. 4. Only one practitioner may furnish and bill the service in a calendar month. 5.The patient may stop CCM services at any time (effective at the end of the month) by phone call to the office staff. 6. The patient will be responsible for cost sharing (co-pay) of up to 20% of the service fee (after annual deductible is met). Patient agreed to services and consent obtained.  Patient Care Team: Lavera Guise, MD as PCP - General (Internal Medicine) Edythe Clarity, [facility] as Pharmacist (Pharmacist)  Recent office visits: 03/19/21 Luiz Ochoa, NP. For back pain. STOPPED Zolpidem 03/02/21 Lavera Guise, MD. For back pain. STARTED Baclofen 10 mg CHANGED Hydrocodone- Acetaminophen 5-300 mg. STOPPED Cyclobenzaprine and Mirtazapine  02/08/21 Theodoro Grist, Chauncey Cruel. NP. For Shortness of breath. Per note: The doctor and the patient discussed CPAP and BPAP information. No medication changes.  02/03/21 Theodoro Grist, Chauncey Cruel NP. For follow-up. STARTED Mirtazapine 7.5 mg daily at bedtime HOLD Ambien. 10/12/20 Allyne Gee, MD. For follow-Up on asthma. Per note: the doctor and the patient discussed sleep apnea. No medication changes.  Recent consult visits: None in last 6 months.  Hospital visits: None in previous 6 months  Objective:  Lab Results  Component Value Date   CREATININE 0.99 02/19/2021   BUN 25 02/19/2021   GFRNONAA 64 03/03/2020   GFRAA 74 03/03/2020   NA 139 02/19/2021   K 4.3 02/19/2021   CALCIUM 10.4 (H) 02/19/2021   CO2 19 (L) 02/19/2021   GLUCOSE 95 02/19/2021    No results found for: HGBA1C, FRUCTOSAMINE, GFR, MICROALBUR  Last diabetic Eye exam: No results found for: HMDIABEYEEXA  Last diabetic Foot exam: No results found for: HMDIABFOOTEX   Lab Results  Component Value Date   CHOL 203 (H) 02/19/2021   HDL 48 02/19/2021   LDLCALC 127 (H) 02/19/2021   TRIG 158 (H) 02/19/2021    Hepatic Function Latest Ref Rng & Units 02/19/2021 03/03/2020 08/31/2018  Total Protein 6.0 - 8.5 g/dL 7.4 7.3 7.1  Albumin 3.7 - 4.7 g/dL 4.8(H) 4.7 5.0(H)  AST 0 - 40 IU/L _0 ALT 0 - 32 IU/L _1 Alk Phosphatase 44 - 121 IU/L 55 52 49  Total Bilirubin 0.0 - 1.2 mg/dL 0.4 0.4 0.4    Lab Results  Component Value Date/Time   TSH 0.652 02/19/2021 11:59 AM   TSH 0.961 03/03/2020 11:29 AM   FREET4 1.53 02/19/2021 11:59 AM   FREET4 1.71 03/03/2020 11:29 AM  CBC Latest Ref Rng & Units 02/19/2021 03/03/2020 08/31/2018  WBC 3.4 - 10.8 x10E3/uL 6.9 7.3 7.1  Hemoglobin 11.1 - 15.9 g/dL 14.1 14.2 14.5  Hematocrit 34.0 - 46.6 % 44.5 41.5 42.6  Platelets 150 - 450 x10E3/uL 360 324 349    Lab Results  Component Value Date/Time   VD25OH 40.6 03/03/2020 11:29 AM   VD25OH 38.9 08/31/2018 01:39 PM    Clinical ASCVD: No  The 10-year ASCVD risk score Mikey Bussing DC Jr., et al., 2013) is: 22%   Values used to calculate the score:     Age: 67 years     Sex: Female     Is Non-Hispanic  African American: No     Diabetic: No     Tobacco smoker: No     Systolic Blood Pressure: 004 mmHg     Is BP treated: Yes     HDL Cholesterol: 48 mg/dL     Total Cholesterol: 203 mg/dL    Depression screen [facility] 2/9 04/20/2021 02/03/2021 11/05/2020  Decreased Interest 0 0 0  Down, Depressed, Hopeless 0 0 0  PHQ - 2 Score 0 0 0  Altered sleeping - - -  Tired, decreased energy - - -  Change in appetite - - -  Feeling bad or failure about yourself  - - -  Trouble concentrating - - -  Moving slowly or fidgety/restless - - -  Suicidal thoughts - - -  PHQ-9 Score - - -      Social History   Tobacco Use  Smoking Status Former  Smokeless Tobacco Never   BP Readings from Last 3 Encounters:  04/20/21 (!) 162/86  03/19/21 138/80  03/02/21 134/78   Pulse Readings from Last 3 Encounters:  04/20/21 70  03/19/21 80  03/02/21 80   Wt Readings from Last 3 Encounters:  04/20/21 151 lb 9.6 oz (68.8 kg)  03/19/21 151 lb 12.8 oz (68.9 kg)  03/02/21 152 lb 6.4 oz (69.1 kg)   BMI Readings from Last 3 Encounters:  04/20/21 28.64 kg/m  03/19/21 28.68 kg/m  03/02/21 28.80 kg/m    Assessment/Interventions: Review of patient past medical history, allergies, medications, health status, including review of consultants reports, laboratory and other test data, was performed as part of comprehensive evaluation and provision of chronic care management services.   SDOH:  (Social Determinants of Health) assessments and interventions performed: Yes  SDOH Screenings   Alcohol Screen: Low Risk    Last Alcohol Screening Score (AUDIT): 0  Depression (PHQ2-9): Low Risk    PHQ-2 Score: 0  Financial Resource Strain: Low Risk    Difficulty of Paying Living Expenses: Not very hard  Food Insecurity: Not on file  Housing: Not on file  Physical Activity: Not on file  Social Connections: Not on file  Stress: Not on file  Tobacco Use: Medium Risk   Smoking Tobacco Use: Former   Smokeless Tobacco  Use: Never  Transportation Needs: Not on file    [place]  Allergies  Allergen Reactions   Advair Diskus [Fluticasone-Salmeterol] Other (See Comments)    Causes asthma attack    Codeine    Erythromycin Other (See Comments)    Stomach pain   Morphine And Related Itching and Nausea And Vomiting   Nsaids    Sulfa Antibiotics Other (See Comments)    unknown    Medications Reviewed Today     Reviewed by Edythe Clarity, [facility] (Pharmacist) on 06/23/21 at 1455  Med List Status: <None>  Medication Order Taking? Sig Documenting Provider Last Dose Status Informant  acetaminophen (TYLENOL) 500 MG tablet [id number] Yes Take 1,000 mg by mouth 2 (two) times daily as needed for mild pain or moderate pain. [provider] Taking Active Self  albuterol (PROVENTIL) (2.5 MG/3ML) 0.083% nebulizer solution [id number] Yes Take 3 mLs (2.5 mg total) by nebulization every 6 (six) hours as needed for wheezing or shortness of breath. Ronnell Freshwater, NP Taking Active   albuterol (VENTOLIN HFA) 108 (90 Base) MCG/ACT inhaler [id number] Yes INHALE 2 PUFFS BY MOUTH EVERY 6 HOURS Abernathy, Alyssa, NP Taking Active   ALPRAZolam (XANAX) 0.25 MG tablet [id number] Yes Take 1 tablet (0.25 mg total) by mouth 2 (two) times daily as needed for anxiety. [name], NP Taking Active   baclofen (LIORESAL) 10 MG tablet [id number] Yes TAKE 1 TABLET BY MOUTH TWICE A DAY AS NEEDED BACK PAIN Lavera Guise, MD Taking Active   bisacodyl (DULCOLAX) 5 MG EC tablet [id number] Yes Take 5-10 mg by mouth at bedtime as needed for moderate constipation. Two at night [provider] Taking Active Self  cetirizine (ZYRTEC) 10 MG tablet [id number] Yes Take 10 mg by mouth daily. [provider] Taking Active Self  cholecalciferol (VITAMIN D) 1000 units tablet [id number] Yes Take 1,000 Units by mouth at bedtime. [provider] Taking Active Self  clindamycin (CLEOCIN T) 1 % external solution  [id number] Yes Apply 1 application topically 2 (two) times daily as needed. For skin breakdown [name], NP Taking Active   cyclobenzaprine (FLEXERIL) 5 MG tablet [id number] Yes Take 5-10 mg by mouth 2 (two) times daily as needed. [provider] Taking Active   docusate sodium (COLACE) 100 MG capsule [id number] Yes Take 100-200 mg by mouth at bedtime as needed for mild constipation or moderate constipation.  [provider] Taking Active Self  estradiol (ESTRACE) 0.1 MG/GM vaginal cream [id number] Yes INSERT 1 GRAM VAGINALLY TWICE WEEKLY AT BEDTIME Boscia, Heather E, NP Taking Active   fenofibrate (TRICOR) 145 MG tablet [id number] Yes Take 1 tablet (145 mg total) by mouth daily. Luiz Ochoa, NP Taking Active   FLUoxetine (PROZAC) 40 MG capsule [id number] Yes Take 1 capsule (40 mg total) by mouth daily. [name], NP Taking Active   fluticasone (FLOVENT HFA) 110 MCG/ACT inhaler [id number] Yes Use 2 puffs inhaled twice dilay Ronnell Freshwater, NP Taking Active   HYDROcodone-Acetaminophen 5-300 MG TABS [id number] Yes Take one tab po bid for back pain [name], NP Taking Active   ipratropium (ATROVENT HFA) 17 MCG/ACT inhaler [id number] Yes INHALE 2 PUFFS 4 TIMES DAILY Boscia, Heather E, NP Taking Active   ipratropium (ATROVENT) 0.02 % nebulizer solution [id number] Yes Use 1 vial nebulized QID prn shortness of breath/wheezing. Ronnell Freshwater, NP Taking Active   isometheptene-acetaminophen-dichloralphenazone (MIDRIN) [phone] MG capsule [id number] Yes Take 1 capsule by mouth 4 (four) times daily as needed for migraine. Maximum 5 capsules in 12 hours for migraine headaches, 8 capsules in 24 hours for tension headaches. [provider] Taking Active Self  levothyroxine (SYNTHROID) 100 MCG tablet [id number] Yes TAKE 1 TABLET BY MOUTH EVERY DAY IN THE MORNING Luiz Ochoa, NP Taking Active   lisinopril (ZESTRIL) 10 MG tablet [id number] Yes TAKE 1 TABLET BY  MOUTH EVERY DAY Lavera Guise, MD Taking Active   magnesium oxide (MAG-OX) 400 MG tablet [id number] Yes Take 400 mg by mouth at bedtime.  [provider] Taking Active Self  montelukast (SINGULAIR) 10 MG tablet  [id number] Yes Take 1 tablet (10 mg total) by mouth daily. Ronnell Freshwater, NP Taking Active   omeprazole (PRILOSEC) 40 MG capsule [id number] Yes TAKE 1 CAPSULE BY MOUTH EVERY DAY Boscia, Heather E, NP Taking Active   polyethylene glycol powder (GLYCOLAX/MIRALAX) 17 GM/SCOOP powder [id number] Yes Take 17 g by mouth every evening. Ronnell Freshwater, NP Taking Active   triamcinolone cream (KENALOG) 0.1 % [id number] Yes Apply 1 application topically 2 (two) times daily as needed. For skin breakdown [provider] Taking Active Self  vitamin B-12 (CYANOCOBALAMIN) 1000 MCG tablet [id number] Yes Take 1,000 mcg by mouth daily. [provider] Taking Active Self            Patient Active Problem List   Diagnosis Date Noted   Vaginal atrophy 49/44/9675   Diastolic dysfunction 91/63/8466   OSA on CPAP 07/15/2020   Tightness in chest 05/20/2020   Hypercalcemia 05/20/2020   Primary insomnia 05/20/2020   Acute non-recurrent pansinusitis 02/12/2020   Encounter for general adult medical examination with abnormal findings 11/10/2019   Gastroesophageal reflux disease without esophagitis 11/10/2019   Dysuria 11/10/2019   Acute otitis externa of left ear 07/15/2019   Irritable bowel syndrome with constipation 07/15/2019   Chronic midline low back pain without sciatica 04/27/2019   Allergic rhinitis due to pollen 04/27/2019   Essential hypertension, benign 04/27/2019   GAD (generalized anxiety disorder) 09/01/2018   Chronic migraine without aura without status migrainosus, not intractable 09/01/2018   Encounter for long-term (current) use of medications 09/01/2018   Asthma without status asthmaticus 01/30/2018   Hyperlipidemia, unspecified 01/30/2018   Hypertension  01/30/2018   Obesity, unspecified 01/30/2018   Hypothyroidism 01/30/2018   Fatty infiltration of liver 12/10/2015   Abdominal pain, LLQ (left lower quadrant) 11/03/2015   History of adenomatous polyp of colon 11/03/2015   Hallux valgus, left 09/18/2014   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   Shoulder arthritis 12/19/2013    Immunization History  Administered Date(s) Administered   Fluad Quad(high Dose 65+) 10/09/2019   Influenza, High Dose Seasonal PF 10/07/2015, 08/31/2018   Influenza,inj,Quad PF,6+ Mos 09/24/2014   Influenza-Unspecified 11/20/2017, 12/23/2020, 12/25/2020   PFIZER(Purple Top)SARS-COV-2 Vaccination 01/03/2020, 01/29/2020, 11/27/2020   Pneumococcal Conjugate-13 11/20/2017    Conditions to be addressed/monitored:  Rudean Hitt, Insomnia  Care Plan : General Pharmacy (Adult)  Updates made by Edythe Clarity, RPH since 06/23/2021 12:00 AM     Problem: HTN,Asthma,HLD, Insomnia, migraine   Priority: High  Onset Date: 03/23/2021     Long-Range Goal: Patient-Specific Goal   Start Date: 03/23/2021  Expected End Date: 09/23/2021  Recent Progress: On track  Priority: High  Note:   Current Barriers:  Unable to achieve control of lipids  Suboptimal therapeutic regimen for cholesterol  Pharmacist Clinical Goal(s):  Patient will achieve control of cholesterol as evidenced by lipid panel adhere to plan to optimize therapeutic regimen for HLD as evidenced by report of adherence to recommended medication management changes contact provider office for questions/concerns as evidenced notation of same in electronic health record through collaboration with PharmD and provider.   Interventions: 1:1 collaboration with Lavera Guise, MD regarding development and update of comprehensive plan of care as evidenced by provider attestation and co-signature Inter-disciplinary care team collaboration (see longitudinal plan of care) Comprehensive medication review performed;  medication list updated in electronic medical record  Hypertension (BP goal <140/90) -Controlled -Current treatment: Lisinopril 68m -Medications previously tried: amlodipine (d/c)  -Current home readings: 128-130/80s per patient, no logs at  appontment -Current dietary habits: says she has been eating more sweets lately -Current exercise habits: minimal due to pain from recent fall -Denies hypotensive/hypertensive symptoms -Educated on BP goals and benefits of medications for prevention of heart attack, stroke and kidney damage; Daily salt intake goal < 2300 mg; Exercise goal of 150 minutes per week; Importance of home blood pressure monitoring; -Counseled to monitor BP at home daily, document, and provide log at future appointments -Recommended to continue current medication  Update 06/23/21 Patient BP at home 130s/80s - I have asked her to monitor and record at least a few times per week so we can check in based off of last in office reading. Still no dizziness or HA's  Continue current meds - will continue to monitor  Hyperlipidemia: (LDL goal < 100) -Uncontrolled -Current treatment: Fenofibrate 1104m daily -Medications previously tried: none noted  -Current dietary patterns: eating more sweets, drinks diet sodas -Current exercise habits: minimal exercise now -Educated on Cholesterol goals;  Benefits of statin for ASCVD risk reduction; Importance of limiting foods high in cholesterol; Reviewed most recent lipid panel and meaning of LDL/HDL/TG  -She reports she has tried statins in the past and had muscle aches, however I could not find it in her med history.  Recommended she work on the diet and have cholesterol re checked.  -Recommended to continue current medication If still elevated at next check, would recommend initiation of Rosuvastatin 557mdaily.  If she cannot tolerate that I would accept every other day  Update 06/23/21 Still no updated lipids Would recheck at  next OV - if still elevated could consider initiation of statin.  Asthma (Goal: control symptoms and prevent exacerbations) -Controlled -Current treatment  Atrovent HFA 1748mFlovent HFA 110m63mentolin HFA 90mc58mn Albuterol 0.083% nebulizer solution -Medications previously tried: Adviar   -Exacerbations requiring treatment in last 6 months: none -Patient reports consistent use of maintenance inhaler -Frequency of rescue inhaler use: nebulizers as needed, infrequently lately -Counseled on Proper inhaler technique; Benefits of consistent maintenance inhaler use When to use rescue inhaler Differences between maintenance and rescue inhalers  -She reports her breathing is as controlled as it has ever been recently.  Denies any restrictions in activities from breathing. -SOB only with exertion. -Recommended to continue current medication  Chronic migraines (Goal: minimize symptoms) -Controlled -Current treatment  Midrin 65-100-325mg 57m the market) -Medications previously tried: Sumatriptan (hangover like effect) -Patient still has some left over Midrin that she is taking, however this has been taken off the market. -She still has occasional migraines and wants something to have on hand if she were to get one.  She mentions not liking the hangover effect of triptans. -Recommended possibility of Fioricet to have as emergency.  Will consult with NP at next visit   Patient Goals/Self-Care Activities Patient will:  - take medications as prescribed check blood pressure daily, document, and provide at future appointments engage in dietary modifications by cutting back on sweets in diet to work on cholesterol  Follow Up Plan: The care management team will reach out to the patient again over the next 90 days.         Medication Assistance: None required.  Patient affirms current coverage meets needs.  Patient's preferred pharmacy is:  [id number] [place] [id number] [id number] [address] [place] [place]  [place] Phone: [phone] Fax: [phone]  Uses pill box? Yes - organizes them two weeks at a time Pt endorses  100% compliance  We discussed: Benefits of medication synchronization, packaging and delivery as well as enhanced pharmacist oversight with [employer]. Patient decided to: Continue current medication management strategy  Care Plan and Follow Up Patient Decision:  Patient agrees to Care Plan and Follow-up.  Plan: The care management team will reach out to the patient again over the next 90 days.  Beverly Milch, PharmD Clinical Pharmacist [place] [phone]

## 2021-06-27 ENCOUNTER — Other Ambulatory Visit: Payer: Self-pay | Admitting: Nurse Practitioner

## 2021-06-27 DIAGNOSIS — M545 Low back pain, unspecified: Secondary | ICD-10-CM

## 2021-06-28 ENCOUNTER — Other Ambulatory Visit: Payer: Self-pay | Admitting: Nurse Practitioner

## 2021-06-28 DIAGNOSIS — J4540 Moderate persistent asthma, uncomplicated: Secondary | ICD-10-CM

## 2021-07-02 ENCOUNTER — Other Ambulatory Visit: Payer: Self-pay | Admitting: Internal Medicine

## 2021-07-06 ENCOUNTER — Telehealth: Payer: Self-pay | Admitting: Pharmacist

## 2021-07-06 ENCOUNTER — Other Ambulatory Visit: Payer: Self-pay

## 2021-07-06 DIAGNOSIS — J4540 Moderate persistent asthma, uncomplicated: Secondary | ICD-10-CM

## 2021-07-06 MED ORDER — FLUTICASONE PROPIONATE HFA 110 MCG/ACT IN AERO: INHALATION_SPRAY | RESPIRATORY_TRACT | 3 refills | Status: DC

## 2021-07-06 NOTE — Progress Notes (Addendum)
Chronic Care Management Pharmacy Assistant   Name: Bonnie West  MRN: [id number] DOB: 1951-07-13  Reason for Encounter: Disease State For HTN.    Conditions to be addressed/monitored: HTN,Asthma,HLD, Insomnia  Recent office visits:  None since 06/23/21  Recent consult visits:  None since 06/23/21  Hospital visits:  None since 06/23/21  Medications: Outpatient Encounter Medications as of 07/06/2021  Medication Sig   acetaminophen (TYLENOL) 500 MG tablet Take 1,000 mg by mouth 2 (two) times daily as needed for mild pain or moderate pain.   albuterol (PROVENTIL) (2.5 MG/3ML) 0.083% nebulizer solution Take 3 mLs (2.5 mg total) by nebulization every 6 (six) hours as needed for wheezing or shortness of breath.   albuterol (VENTOLIN HFA) 108 (90 Base) MCG/ACT inhaler INHALE 2 PUFFS BY MOUTH EVERY 6 HOURS   ALPRAZolam (XANAX) 0.25 MG tablet Take 1 tablet (0.25 mg total) by mouth 2 (two) times daily as needed for anxiety.   baclofen (LIORESAL) 10 MG tablet TAKE 1 TABLET BY MOUTH TWICE A DAY AS NEEDED BACK PAIN   bisacodyl (DULCOLAX) 5 MG EC tablet Take 5-10 mg by mouth at bedtime as needed for moderate constipation. Two at night   cetirizine (ZYRTEC) 10 MG tablet Take 10 mg by mouth daily.   cholecalciferol (VITAMIN D) 1000 units tablet Take 1,000 Units by mouth at bedtime.   clindamycin (CLEOCIN T) 1 % external solution Apply 1 application topically 2 (two) times daily as needed. For skin breakdown   cyclobenzaprine (FLEXERIL) 5 MG tablet Take 5-10 mg by mouth 2 (two) times daily as needed.   docusate sodium (COLACE) 100 MG capsule Take 100-200 mg by mouth at bedtime as needed for mild constipation or moderate constipation.    estradiol (ESTRACE) 0.1 MG/GM vaginal cream INSERT 1 GRAM VAGINALLY TWICE WEEKLY AT BEDTIME   fenofibrate (TRICOR) 145 MG tablet Take 1 tablet (145 mg total) by mouth daily.   FLUoxetine (PROZAC) 40 MG capsule Take 1 capsule (40 mg total) by mouth daily.   fluticasone  (FLOVENT HFA) 110 MCG/ACT inhaler Use 2 puffs inhaled twice dilay   HYDROcodone-Acetaminophen 5-300 MG TABS Take one tab po bid for back pain   ipratropium (ATROVENT HFA) 17 MCG/ACT inhaler INHALE 2 PUFFS 4 TIMES DAILY   ipratropium (ATROVENT) 0.02 % nebulizer solution Use 1 vial nebulized QID prn shortness of breath/wheezing.   isometheptene-acetaminophen-dichloralphenazone (MIDRIN) 65-100-325 MG capsule Take 1 capsule by mouth 4 (four) times daily as needed for migraine. Maximum 5 capsules in 12 hours for migraine headaches, 8 capsules in 24 hours for tension headaches.   levothyroxine (SYNTHROID) 100 MCG tablet TAKE 1 TABLET BY MOUTH EVERY DAY IN THE MORNING   lisinopril (ZESTRIL) 10 MG tablet TAKE 1 TABLET BY MOUTH EVERY DAY   magnesium oxide (MAG-OX) 400 MG tablet Take 400 mg by mouth at bedtime.    montelukast (SINGULAIR) 10 MG tablet Take 1 tablet (10 mg total) by mouth daily.   omeprazole (PRILOSEC) 40 MG capsule TAKE 1 CAPSULE BY MOUTH EVERY DAY   polyethylene glycol powder (GLYCOLAX/MIRALAX) 17 GM/SCOOP powder Take 17 g by mouth every evening.   triamcinolone cream (KENALOG) 0.1 % Apply 1 application topically 2 (two) times daily as needed. For skin breakdown   vitamin B-12 (CYANOCOBALAMIN) 1000 MCG tablet Take 1,000 mcg by mouth daily.   No facility-administered encounter medications on file as of 07/06/2021.   Reviewed chart prior to disease state call. Spoke with patient regarding BP  Recent Office Vitals: BP Readings  from Last 3 Encounters:  04/20/21 (!) 162/86  03/19/21 138/80  03/02/21 134/78   Pulse Readings from Last 3 Encounters:  04/20/21 70  03/19/21 80  03/02/21 80    Wt Readings from Last 3 Encounters:  04/20/21 151 lb 9.6 oz (68.8 kg)  03/19/21 151 lb 12.8 oz (68.9 kg)  03/02/21 152 lb 6.4 oz (69.1 kg)     Kidney Function Lab Results  Component Value Date/Time   CREATININE 0.99 02/19/2021 11:59 AM   CREATININE 0.91 03/03/2020 11:29 AM   GFRNONAA 64  03/03/2020 11:29 AM   GFRAA 74 03/03/2020 11:29 AM    BMP Latest Ref Rng & Units 02/19/2021 06/16/2020 03/03/2020  Glucose 65 - 99 mg/dL 95 - 105(H)  BUN 8 - 27 mg/dL 25 - 23  Creatinine 0.57 - 1.00 mg/dL 0.99 - 0.91  BUN/Creat Ratio 12 - 28 25 - 25  Sodium 134 - 144 mmol/L 139 - 139  Potassium 3.5 - 5.2 mmol/L 4.3 - 4.4  Chloride 96 - 106 mmol/L 102 - 102  CO2 20 - 29 mmol/L 19(L) - 22  Calcium 8.7 - 10.3 mg/dL 10.4(H) 10.5(H) 10.7(H)    Current antihypertensive regimen:  Lisinopril '10mg'$   How often are you checking your Blood Pressure? Patient stated daily  Current home BP readings: Patient stated sometimes its been running high but she doesn't write the readings down.  What recent interventions/DTPs have been made by any provider to improve Blood Pressure control since last CPP Visit: None.   Any recent hospitalizations or ED visits since last visit with CPP? Patient stated no.   What diet changes have been made to improve Blood Pressure Control?  Patient stated she does eat healthy choices daily. She stated she doesn't drink that much water but she is trying to do better.   What exercise is being done to improve your Blood Pressure Control?  Patient stated she is not that active but she knows that she needs to do more and plans on doing so.  Adherence Review: Is the patient currently on ACE/ARB medication? Lisinopril 10 mg  Does the patient have >5 day gap between last estimated fill dates? Per misc rpts, no.   Star Rating Drugs: Lisinopril 10 mg 90 DS 05/22/21.  Follow-Up:Pharmacist Review  Charlann Lange, RMA Clinical Pharmacist Assistant [phone]  10 minutes spent in review, coordination, and documentation.  Reviewed by: Beverly Milch, PharmD Clinical Pharmacist [phone]

## 2021-07-13 DIAGNOSIS — H532 Diplopia: Secondary | ICD-10-CM | POA: Diagnosis not present

## 2021-07-13 DIAGNOSIS — H518 Other specified disorders of binocular movement: Secondary | ICD-10-CM | POA: Diagnosis not present

## 2021-07-13 DIAGNOSIS — H50012 Monocular esotropia, left eye: Secondary | ICD-10-CM | POA: Diagnosis not present

## 2021-07-19 ENCOUNTER — Ambulatory Visit (INDEPENDENT_AMBULATORY_CARE_PROVIDER_SITE_OTHER): Payer: Medicare Other | Admitting: Nurse Practitioner

## 2021-07-19 ENCOUNTER — Encounter: Payer: Self-pay | Admitting: Nurse Practitioner

## 2021-07-19 ENCOUNTER — Other Ambulatory Visit: Payer: Self-pay

## 2021-07-19 VITALS — BP 138/88 | HR 73 | Temp 97.3°F | Resp 16 | Ht 61.0 in | Wt 149.4 lb

## 2021-07-19 DIAGNOSIS — I159 Secondary hypertension, unspecified: Secondary | ICD-10-CM

## 2021-07-19 DIAGNOSIS — E782 Mixed hyperlipidemia: Secondary | ICD-10-CM

## 2021-07-19 DIAGNOSIS — M545 Low back pain, unspecified: Secondary | ICD-10-CM | POA: Diagnosis not present

## 2021-07-19 MED ORDER — FENOFIBRATE 145 MG PO TABS: 145.0000 mg | ORAL_TABLET | Freq: Every day | ORAL | 1 refills | Status: DC

## 2021-07-19 NOTE — Progress Notes (Signed)
[facility] [place], [place] [address]  Internal MEDICINE  Office Visit Note  Patient Name: Bonnie West  [id number]  [id number]  Date of Service: 07/19/2021  Chief Complaint  Patient presents with   Follow-up    Med refills, pt is having eye surgery Friday    Depression   Gastroesophageal Reflux   Sleep Apnea   Hyperlipidemia   Hypertension   Anxiety   Asthma    HPI Bonnie West presents for a follow up visit for hypertension and medication refill. Her previous office visit was in May and she was having low back pain without sciatica. Xray did not show any acute problems. She reports that her back is feeling better now. Her blood pressure also is improved. She is also having eye surgery on Friday. She had no other questions or concerns today.     Current Medication: Outpatient Encounter Medications as of 07/19/2021  Medication Sig   acetaminophen (TYLENOL) 500 MG tablet Take 1,000 mg by mouth 2 (two) times daily as needed for mild pain or moderate pain.   albuterol (PROVENTIL) (2.5 MG/3ML) 0.083% nebulizer solution Take 3 mLs (2.5 mg total) by nebulization every 6 (six) hours as needed for wheezing or shortness of breath.   albuterol (VENTOLIN HFA) 108 (90 Base) MCG/ACT inhaler INHALE 2 PUFFS BY MOUTH EVERY 6 HOURS   ALPRAZolam (XANAX) 0.25 MG tablet Take 1 tablet (0.25 mg total) by mouth 2 (two) times daily as needed for anxiety.   baclofen (LIORESAL) 10 MG tablet TAKE 1 TABLET BY MOUTH TWICE A DAY AS NEEDED BACK PAIN   bisacodyl (DULCOLAX) 5 MG EC tablet Take 5-10 mg by mouth at bedtime as needed for moderate constipation. Two at night   cetirizine (ZYRTEC) 10 MG tablet Take 10 mg by mouth daily.   cholecalciferol (VITAMIN D) 1000 units tablet Take 1,000 Units by mouth at bedtime.   clindamycin (CLEOCIN T) 1 % external solution Apply 1 application topically 2 (two) times daily as needed. For skin breakdown   cyclobenzaprine (FLEXERIL) 5 MG tablet Take 5-10  mg by mouth 2 (two) times daily as needed.   docusate sodium (COLACE) 100 MG capsule Take 100-200 mg by mouth at bedtime as needed for mild constipation or moderate constipation.    estradiol (ESTRACE) 0.1 MG/GM vaginal cream INSERT 1 GRAM VAGINALLY TWICE WEEKLY AT BEDTIME   FLUoxetine (PROZAC) 40 MG capsule Take 1 capsule (40 mg total) by mouth daily.   fluticasone (FLOVENT HFA) 110 MCG/ACT inhaler Use 2 puffs inhaled twice dilay   HYDROcodone-Acetaminophen 5-300 MG TABS Take one tab po bid for back pain   ipratropium (ATROVENT HFA) 17 MCG/ACT inhaler INHALE 2 PUFFS 4 TIMES DAILY   ipratropium (ATROVENT) 0.02 % nebulizer solution Use 1 vial nebulized QID prn shortness of breath/wheezing.   isometheptene-acetaminophen-dichloralphenazone (MIDRIN) 65-100-325 MG capsule Take 1 capsule by mouth 4 (four) times daily as needed for migraine. Maximum 5 capsules in 12 hours for migraine headaches, 8 capsules in 24 hours for tension headaches.   levothyroxine (SYNTHROID) 100 MCG tablet TAKE 1 TABLET BY MOUTH EVERY DAY IN THE MORNING   lisinopril (ZESTRIL) 10 MG tablet TAKE 1 TABLET BY MOUTH EVERY DAY   magnesium oxide (MAG-OX) 400 MG tablet Take 400 mg by mouth at bedtime.    montelukast (SINGULAIR) 10 MG tablet Take 1 tablet (10 mg total) by mouth daily.   omeprazole (PRILOSEC) 40 MG capsule TAKE 1 CAPSULE BY MOUTH EVERY DAY   polyethylene glycol  powder (GLYCOLAX/MIRALAX) 17 GM/SCOOP powder Take 17 g by mouth every evening.   triamcinolone cream (KENALOG) 0.1 % Apply 1 application topically 2 (two) times daily as needed. For skin breakdown   vitamin B-12 (CYANOCOBALAMIN) 1000 MCG tablet Take 1,000 mcg by mouth daily.   [DISCONTINUED] fenofibrate (TRICOR) 145 MG tablet Take 1 tablet (145 mg total) by mouth daily.   fenofibrate (TRICOR) 145 MG tablet Take 1 tablet (145 mg total) by mouth daily.   No facility-administered encounter medications on file as of 07/19/2021.    Surgical History: Past Surgical  History:  Procedure Laterality Date   BUNIONECTOMY     CATARACT EXTRACTION W/PHACO Right 10/11/2016   Procedure: CATARACT EXTRACTION PHACO AND INTRAOCULAR LENS PLACEMENT (IOC);  Surgeon: Birder Robson, MD;  Location: [facility] ORS;  Service: Ophthalmology;  Laterality: Right;  Lot# [id number] H [place]: 00:37.7 AP%: 18.1 CDE: 6.80   CATARACT EXTRACTION W/PHACO Left 11/08/2016   Procedure: CATARACT EXTRACTION PHACO AND INTRAOCULAR LENS PLACEMENT ([place]);  Surgeon: Birder Robson, MD;  Location: [facility] ORS;  Service: Ophthalmology;  Laterality: Left;  PACK LOT: [id number] H US:00:32 AP:44 CDE:6.46   COLONOSCOPY     COLONOSCOPY WITH PROPOFOL N/A 11/26/2015   Procedure: COLONOSCOPY WITH PROPOFOL;  Surgeon: Manya Silvas, MD;  Location: [facility] ENDOSCOPY;  Service: Endoscopy;  Laterality: N/A;   EYE SURGERY     TONSILLECTOMY      Medical History: Past Medical History:  Diagnosis Date   Allergic rhinitis    Anxiety    Asthma    Blood transfusion without reported diagnosis    Depression    Diverticulosis    Dyspnea    Esophagitis    GERD (gastroesophageal reflux disease)    Headache    Heart murmur    Hyperlipemia    Hypertension    Hypothyroidism    Obesity    Osteopenia    Palpitations    Pneumothorax    Sleep apnea     Family History: Family History  Problem Relation Age of Onset   Breast cancer Maternal Grandmother    Stroke Maternal Grandmother    Bladder Cancer Father    Prostate cancer Father    Heart attack Father    Aortic aneurysm Father    Congestive Heart Failure Father    Colon cancer Paternal Grandmother    Aneurysm Paternal Grandmother    Stroke Paternal Grandmother    Congestive Heart Failure Paternal Grandmother    Dementia Mother    Stroke Mother    Dementia Maternal Grandfather    Stroke Maternal Grandfather    Dementia Paternal Grandfather    Stroke Paternal Grandfather     Social History   Socioeconomic History   Marital status: Married    Spouse name:  Not on file   Number of children: Not on file   Years of education: Not on file   Highest education level: Not on file  Occupational History   Not on file  Tobacco Use   Smoking status: Former   Smokeless tobacco: Never  Vaping Use   Vaping Use: Never used  Substance and Sexual Activity   Alcohol use: No   Drug use: No   Sexual activity: Not Currently    Partners: Male    Birth control/protection: Post-menopausal  Other Topics Concern   Not on file  Social History Narrative   Not on file   Social Determinants of Health   Financial Resource Strain: Low Risk    Difficulty of Paying Living Expenses: Not very  hard  Food Insecurity: Not on file  Transportation Needs: Not on file  Physical Activity: Not on file  Stress: Not on file  Social Connections: Not on file  Intimate Partner Violence: Not on file      Review of Systems  Constitutional:  Negative for chills, fatigue and unexpected weight change.  HENT:  Negative for congestion, rhinorrhea, sneezing and sore throat.   Eyes:  Negative for redness.  Respiratory:  Negative for cough, chest tightness and shortness of breath.   Cardiovascular:  Negative for chest pain and palpitations.  Gastrointestinal:  Negative for abdominal pain, constipation, diarrhea, nausea and vomiting.  Genitourinary:  Negative for dysuria and frequency.  Musculoskeletal:  Negative for arthralgias, back pain, joint swelling and neck pain.  Skin:  Negative for rash.  Neurological: Negative.  Negative for tremors and numbness.  Hematological:  Negative for adenopathy. Does not bruise/bleed easily.  Psychiatric/Behavioral:  Negative for behavioral problems (Depression), sleep disturbance and suicidal ideas. The patient is not nervous/anxious.    Vital Signs: BP 138/88   Pulse 73   Temp (!) 97.3 F (36.3 C)   Resp 16   Ht '5\' 1"'$  (1.549 m)   Wt 149 lb 6.4 oz (67.8 kg)   SpO2 97%   BMI 28.23 kg/m    Physical Exam Vitals reviewed.   Constitutional:      General: She is not in acute distress.    Appearance: Normal appearance. She is normal weight. She is not ill-appearing.  HENT:     Head: Normocephalic and atraumatic.  Eyes:     Extraocular Movements: Extraocular movements intact.     Pupils: Pupils are equal, round, and reactive to light.  Cardiovascular:     Rate and Rhythm: Normal rate and regular rhythm.  Pulmonary:     Effort: Pulmonary effort is normal. No respiratory distress.  Neurological:     Mental Status: She is alert and oriented to person, place, and time.     Cranial Nerves: No cranial nerve deficit.     Coordination: Coordination normal.     Gait: Gait normal.  Psychiatric:        Mood and Affect: Mood normal.        Behavior: Behavior normal.     Assessment/Plan: 1. Secondary hypertension Blood pressure is stable, continue medication as prescribed.   2. Mixed hyperlipidemia Refill ordered.  - fenofibrate (TRICOR) 145 MG tablet; Take 1 tablet (145 mg total) by mouth daily.  Dispense: 90 tablet; Refill: 1  3. Acute midline low back pain without sciatica Improved.   General Counseling: paiten lowy understanding of the findings of todays visit and agrees with plan of treatment. I have discussed any further diagnostic evaluation that may be needed or ordered today. We also reviewed her medications today. she has been encouraged to call the office with any questions or concerns that should arise related to todays visit.    No orders of the defined types were placed in this encounter.   Meds ordered this encounter  Medications   fenofibrate (TRICOR) 145 MG tablet    Sig: Take 1 tablet (145 mg total) by mouth daily.    Dispense:  90 tablet    Refill:  1    Return in about 4 months (around 11/03/2021) for CPE, [place] PCP.   Total time spent:20 Minutes Time spent includes review of chart, medications, test results, and follow up plan with the patient.   [place] Controlled  Substance Database was reviewed by me.  This patient was seen by [name], FNP-C in collaboration with Dr. Clayborn Bigness as a part of collaborative care agreement.   Cassara Nida R. Valetta Fuller, MSN, FNP-C Internal medicine

## 2021-07-21 DIAGNOSIS — E039 Hypothyroidism, unspecified: Secondary | ICD-10-CM | POA: Diagnosis not present

## 2021-07-21 DIAGNOSIS — I10 Essential (primary) hypertension: Secondary | ICD-10-CM | POA: Diagnosis not present

## 2021-07-21 DIAGNOSIS — K219 Gastro-esophageal reflux disease without esophagitis: Secondary | ICD-10-CM | POA: Diagnosis not present

## 2021-07-23 DIAGNOSIS — Z87891 Personal history of nicotine dependence: Secondary | ICD-10-CM | POA: Diagnosis not present

## 2021-07-23 DIAGNOSIS — I10 Essential (primary) hypertension: Secondary | ICD-10-CM | POA: Diagnosis not present

## 2021-07-23 DIAGNOSIS — E079 Disorder of thyroid, unspecified: Secondary | ICD-10-CM | POA: Diagnosis not present

## 2021-07-23 DIAGNOSIS — J45909 Unspecified asthma, uncomplicated: Secondary | ICD-10-CM | POA: Diagnosis not present

## 2021-07-23 DIAGNOSIS — H50012 Monocular esotropia, left eye: Secondary | ICD-10-CM | POA: Diagnosis not present

## 2021-07-23 DIAGNOSIS — G4730 Sleep apnea, unspecified: Secondary | ICD-10-CM | POA: Diagnosis not present

## 2021-07-23 DIAGNOSIS — Z79890 Hormone replacement therapy (postmenopausal): Secondary | ICD-10-CM | POA: Diagnosis not present

## 2021-07-23 DIAGNOSIS — H518 Other specified disorders of binocular movement: Secondary | ICD-10-CM | POA: Diagnosis not present

## 2021-07-23 DIAGNOSIS — M1990 Unspecified osteoarthritis, unspecified site: Secondary | ICD-10-CM | POA: Diagnosis not present

## 2021-07-23 DIAGNOSIS — H519 Unspecified disorder of binocular movement: Secondary | ICD-10-CM | POA: Diagnosis not present

## 2021-07-23 DIAGNOSIS — K219 Gastro-esophageal reflux disease without esophagitis: Secondary | ICD-10-CM | POA: Diagnosis not present

## 2021-07-23 DIAGNOSIS — Z79899 Other long term (current) drug therapy: Secondary | ICD-10-CM | POA: Diagnosis not present

## 2021-07-23 HISTORY — PX: EYE SURGERY: SHX253

## 2021-08-05 ENCOUNTER — Other Ambulatory Visit: Payer: Self-pay | Admitting: Nurse Practitioner

## 2021-08-05 DIAGNOSIS — L719 Rosacea, unspecified: Secondary | ICD-10-CM

## 2021-08-19 ENCOUNTER — Other Ambulatory Visit: Payer: Self-pay | Admitting: Nurse Practitioner

## 2021-08-19 DIAGNOSIS — F411 Generalized anxiety disorder: Secondary | ICD-10-CM

## 2021-09-02 ENCOUNTER — Telehealth: Payer: Self-pay | Admitting: Pharmacist

## 2021-09-02 NOTE — Progress Notes (Signed)
Chronic Care Management Pharmacy Assistant   Name: Bonnie West  MRN: [id number] DOB: 07-17-1951  Reason for Encounter: Disease State For HTN.   Conditions to be addressed/monitored: HTN,Asthma,HLD, Insomnia  Recent office visits:  07/19/21 Bonnie Osgood, NP. For follow-up. No medication changes.  Recent consult visits:  07/27/21  Ophthalmology [name], [name], MD For post op. No medication changes.  07/23/21  Ophthalmology [name], [name], MD For op visit. No information given.  07/13/21 Ophthalmology [name], [name], MD For Diplopia No medication changes.   Hospital visits:  None since 07/06/21  Medications: Outpatient Encounter Medications as of 09/02/2021  Medication Sig   acetaminophen (TYLENOL) 500 MG tablet Take 1,000 mg by mouth 2 (two) times daily as needed for mild pain or moderate pain.   albuterol (PROVENTIL) (2.5 MG/3ML) 0.083% nebulizer solution Take 3 mLs (2.5 mg total) by nebulization every 6 (six) hours as needed for wheezing or shortness of breath.   albuterol (VENTOLIN HFA) 108 (90 Base) MCG/ACT inhaler INHALE 2 PUFFS BY MOUTH EVERY 6 HOURS   ALPRAZolam (XANAX) 0.25 MG tablet TAKE 1 TABLET BY MOUTH 2 TIMES DAILY AS NEEDED FOR ANXIETY.   baclofen (LIORESAL) 10 MG tablet TAKE 1 TABLET BY MOUTH TWICE A DAY AS NEEDED BACK PAIN   bisacodyl (DULCOLAX) 5 MG EC tablet Take 5-10 mg by mouth at bedtime as needed for moderate constipation. Two at night   cetirizine (ZYRTEC) 10 MG tablet Take 10 mg by mouth daily.   cholecalciferol (VITAMIN D) 1000 units tablet Take 1,000 Units by mouth at bedtime.   clindamycin (CLEOCIN T) 1 % external solution APPLY 1 APPLICATION TOPICALLY 2 (TWO) TIMES DAILY AS NEEDED. FOR SKIN BREAKDOWN   cyclobenzaprine (FLEXERIL) 5 MG tablet Take 5-10 mg by mouth 2 (two) times daily as needed.   docusate sodium (COLACE) 100 MG capsule Take 100-200 mg by mouth at bedtime as needed for mild constipation or moderate constipation.     estradiol (ESTRACE) 0.1 MG/GM vaginal cream INSERT 1 GRAM VAGINALLY TWICE WEEKLY AT BEDTIME   fenofibrate (TRICOR) 145 MG tablet Take 1 tablet (145 mg total) by mouth daily.   FLUoxetine (PROZAC) 40 MG capsule Take 1 capsule (40 mg total) by mouth daily.   fluticasone (FLOVENT HFA) 110 MCG/ACT inhaler Use 2 puffs inhaled twice dilay   HYDROcodone-Acetaminophen 5-300 MG TABS Take one tab po bid for back pain   ipratropium (ATROVENT HFA) 17 MCG/ACT inhaler INHALE 2 PUFFS 4 TIMES DAILY   ipratropium (ATROVENT) 0.02 % nebulizer solution Use 1 vial nebulized QID prn shortness of breath/wheezing.   isometheptene-acetaminophen-dichloralphenazone (MIDRIN) 65-100-325 MG capsule Take 1 capsule by mouth 4 (four) times daily as needed for migraine. Maximum 5 capsules in 12 hours for migraine headaches, 8 capsules in 24 hours for tension headaches.   levothyroxine (SYNTHROID) 100 MCG tablet TAKE 1 TABLET BY MOUTH EVERY DAY IN THE MORNING   lisinopril (ZESTRIL) 10 MG tablet TAKE 1 TABLET BY MOUTH EVERY DAY   magnesium oxide (MAG-OX) 400 MG tablet Take 400 mg by mouth at bedtime.    montelukast (SINGULAIR) 10 MG tablet Take 1 tablet (10 mg total) by mouth daily.   omeprazole (PRILOSEC) 40 MG capsule TAKE 1 CAPSULE BY MOUTH EVERY DAY   polyethylene glycol powder (GLYCOLAX/MIRALAX) 17 GM/SCOOP powder Take 17 g by mouth every evening.   triamcinolone cream (KENALOG) 0.1 % Apply 1 application topically 2 (two) times daily as needed. For skin breakdown   vitamin B-12 (CYANOCOBALAMIN) 1000 MCG tablet Take  1,000 mcg by mouth daily.   No facility-administered encounter medications on file as of 09/02/2021.   Reviewed chart prior to disease state call. Spoke with patient regarding BP  Recent Office Vitals: BP Readings from Last 3 Encounters:  07/19/21 138/88  04/20/21 (!) 162/86  03/19/21 138/80     Wt Readings from Last 3 Encounters:  07/19/21 149 lb 6.4 oz (67.8 kg)  04/20/21 151 lb 9.6 oz (68.8 kg)   03/19/21 151 lb 12.8 oz (68.9 kg)     Kidney Function Lab Results  Component Value Date/Time   CREATININE 0.99 02/19/2021 11:59 AM   CREATININE 0.91 03/03/2020 11:29 AM   GFRNONAA 64 03/03/2020 11:29 AM   GFRAA 74 03/03/2020 11:29 AM    BMP Latest Ref Rng & Units 02/19/2021 06/16/2020 03/03/2020  Glucose 65 - 99 mg/dL 95 - 105(H)  BUN 8 - 27 mg/dL 25 - 23  Creatinine 0.57 - 1.00 mg/dL 0.99 - 0.91  BUN/Creat Ratio 12 - 28 25 - 25  Sodium 134 - 144 mmol/L 139 - 139  Potassium 3.5 - 5.2 mmol/L 4.3 - 4.4  Chloride 96 - 106 mmol/L 102 - 102  CO2 20 - 29 mmol/L 19(L) - 22  Calcium 8.7 - 10.3 mg/dL 10.4(H) 10.5(H) 10.7(H)    Current antihypertensive regimen:  Lisinopril 10mg   How often are you checking your Blood Pressure? Patient stated daily  Current home BP readings: Patient stated her blood pressure ranges around 120-130/77-80  What recent interventions/DTPs have been made by any provider to improve Blood Pressure control since last CPP Visit: None.  Any recent hospitalizations or ED visits since last visit with CPP? Patient stated no.   What diet changes have been made to improve Blood Pressure Control?  Patient stated her appetite is about the sam and not much has changed in her diet.   What exercise is being done to improve your Blood Pressure Control?  Patient stated she is active sometimes but she could be doing more.   Adherence Review: Is the patient currently on ACE/ARB medication?  Lisinopril 10 mg   Does the patient have >5 day gap between last estimated fill dates? Per misc rpts, no.   Care Gaps:Patient is due for her mammogram.   Star Rating Drugs: Lisinopril 10 mg 08/19/21 90 DS.   Follow-Up:Pharmacist Review  Charlann Lange, [place] Pharmacist Assistant [phone]

## 2021-09-20 DIAGNOSIS — K219 Gastro-esophageal reflux disease without esophagitis: Secondary | ICD-10-CM | POA: Diagnosis not present

## 2021-09-20 DIAGNOSIS — I10 Essential (primary) hypertension: Secondary | ICD-10-CM | POA: Diagnosis not present

## 2021-09-20 DIAGNOSIS — E039 Hypothyroidism, unspecified: Secondary | ICD-10-CM | POA: Diagnosis not present

## 2021-09-21 ENCOUNTER — Other Ambulatory Visit: Payer: Self-pay | Admitting: Internal Medicine

## 2021-09-21 ENCOUNTER — Other Ambulatory Visit: Payer: Self-pay | Admitting: Nurse Practitioner

## 2021-09-21 ENCOUNTER — Other Ambulatory Visit: Payer: Self-pay

## 2021-09-21 DIAGNOSIS — J4540 Moderate persistent asthma, uncomplicated: Secondary | ICD-10-CM

## 2021-09-21 DIAGNOSIS — K219 Gastro-esophageal reflux disease without esophagitis: Secondary | ICD-10-CM

## 2021-09-21 MED ORDER — MONTELUKAST SODIUM 10 MG PO TABS: 10.0000 mg | ORAL_TABLET | Freq: Every day | ORAL | 3 refills | Status: DC

## 2021-09-21 MED ORDER — OMEPRAZOLE 40 MG PO CPDR: DELAYED_RELEASE_CAPSULE | ORAL | 3 refills | Status: DC

## 2021-09-24 DIAGNOSIS — M479 Spondylosis, unspecified: Secondary | ICD-10-CM | POA: Diagnosis not present

## 2021-09-24 DIAGNOSIS — M19019 Primary osteoarthritis, unspecified shoulder: Secondary | ICD-10-CM | POA: Diagnosis not present

## 2021-09-24 DIAGNOSIS — J45909 Unspecified asthma, uncomplicated: Secondary | ICD-10-CM | POA: Diagnosis not present

## 2021-09-24 DIAGNOSIS — H5005 Alternating esotropia: Secondary | ICD-10-CM | POA: Diagnosis not present

## 2021-09-24 DIAGNOSIS — Z87891 Personal history of nicotine dependence: Secondary | ICD-10-CM | POA: Diagnosis not present

## 2021-09-24 DIAGNOSIS — G4730 Sleep apnea, unspecified: Secondary | ICD-10-CM | POA: Diagnosis not present

## 2021-09-24 DIAGNOSIS — Z79899 Other long term (current) drug therapy: Secondary | ICD-10-CM | POA: Diagnosis not present

## 2021-09-24 DIAGNOSIS — Z7951 Long term (current) use of inhaled steroids: Secondary | ICD-10-CM | POA: Diagnosis not present

## 2021-09-24 DIAGNOSIS — H532 Diplopia: Secondary | ICD-10-CM | POA: Diagnosis not present

## 2021-09-24 DIAGNOSIS — Z9889 Other specified postprocedural states: Secondary | ICD-10-CM | POA: Diagnosis not present

## 2021-09-24 DIAGNOSIS — G8929 Other chronic pain: Secondary | ICD-10-CM | POA: Diagnosis not present

## 2021-09-24 DIAGNOSIS — I10 Essential (primary) hypertension: Secondary | ICD-10-CM | POA: Diagnosis not present

## 2021-09-24 DIAGNOSIS — E039 Hypothyroidism, unspecified: Secondary | ICD-10-CM | POA: Diagnosis not present

## 2021-09-24 DIAGNOSIS — M19072 Primary osteoarthritis, left ankle and foot: Secondary | ICD-10-CM | POA: Diagnosis not present

## 2021-09-24 DIAGNOSIS — H50312 Intermittent monocular esotropia, left eye: Secondary | ICD-10-CM | POA: Diagnosis not present

## 2021-09-24 DIAGNOSIS — K219 Gastro-esophageal reflux disease without esophagitis: Secondary | ICD-10-CM | POA: Diagnosis not present

## 2021-09-24 HISTORY — PX: EYE SURGERY: SHX253

## 2021-09-29 ENCOUNTER — Other Ambulatory Visit: Payer: Self-pay | Admitting: Internal Medicine

## 2021-10-06 ENCOUNTER — Other Ambulatory Visit: Payer: Self-pay | Admitting: Nurse Practitioner

## 2021-10-12 ENCOUNTER — Other Ambulatory Visit: Payer: Self-pay | Admitting: Internal Medicine

## 2021-10-12 DIAGNOSIS — F411 Generalized anxiety disorder: Secondary | ICD-10-CM

## 2021-10-13 NOTE — Telephone Encounter (Signed)
Med sent.

## 2021-10-18 ENCOUNTER — Other Ambulatory Visit: Payer: Self-pay

## 2021-10-18 DIAGNOSIS — E039 Hypothyroidism, unspecified: Secondary | ICD-10-CM

## 2021-10-18 MED ORDER — LEVOTHYROXINE SODIUM 100 MCG PO TABS: ORAL_TABLET | ORAL | 1 refills | Status: DC

## 2021-10-27 ENCOUNTER — Ambulatory Visit: Payer: Medicare Other | Admitting: Student-PharmD

## 2021-10-27 ENCOUNTER — Other Ambulatory Visit: Payer: Self-pay

## 2021-10-27 DIAGNOSIS — E782 Mixed hyperlipidemia: Secondary | ICD-10-CM

## 2021-10-27 DIAGNOSIS — I159 Secondary hypertension, unspecified: Secondary | ICD-10-CM

## 2021-10-27 NOTE — Progress Notes (Signed)
Follow Up Pharmacist Visit   Bonnie West,Bonnie West  [id number] 9 years, Female  DOB: 12-30-1950  M: [phone]  Summary: Patient LDL was elevated on last lipid panel, patient would want new lipid panel before considering any new medication such as zetia (patient reports failure of statin therapy due to muscle aches). Patient reported controlled home BP readings. Patient reports limited exercise due to eye surgeries.  Recommendations/Changes made from today's visit: Continue current medication therapy.  Plan: F/U 04/06/22    Patient Chart Prep  Completed by Charlann Lange on 10/27/2021  Chronic Conditions Patient's Chronic Conditions: Hypertension (HTN), Asthma, Gastroesophageal Reflux Disease (GERD), Hypothyroidism, Osteoarthritis, Hyperlipidemia/Dyslipidemia (HLD), Anxiety, Insomnia  Doctor and Hospital Visits Were there PCP Visits since last visit with the Pharmacist?: No Were there Specialist Visits since last visit with the Pharmacist?: Yes Visit #1: 08/25/21 Ophthalmology [name], [name]. For Alternating esotropia. No more information given.  Visit #2: 09/24/21 [place] For OP Visit/Ophthalmology: STRABISMUS SURGERY, RECESSION OR RESECTION PROCEDURE; 1 HORIZONTAL MUSCLE. No more information given. Visit #3: 09/27/21 [place], [place] For post op visit. PER NOTE:Maxitrol qhs x 1 week and stop, Continue using the antibiotic ointment at bedtime x 1 week and stop. You can also use artificial tears during the day as needed.  Was there a Hospital Visit in last 30 days?: No Were there other Hospital Visits since last visit with the Pharmacist?: No  Medication Information Have there been any medication changes from PCP or Specialist since last visit with the Pharmacist?: No Are there any Medication adherence gaps (beyond 5 days past due)?: No Medication adherence rates for the STAR  rating drugs: Lisinopril 10 mg 90 DS 08/19/21, 05/22/21. List Patient's current Care Gaps: Need Breast Cancer Screening  Pre-Call Questions  Are you able to connect with Patient?: Yes Visit Type: Phone Call May we confirm what is the best phone # for the pharmacist to call you?: [phone] Have you been in the hospital or emergency room recently?: No Do you have any questions or concerns that you want to make sure your pharmacist addresses with your during your appointment?: No What, if any, problems do you have getting your medications from the pharmacy?: None Is there anything else that you would like me to pass along to your pharmacist or PCP?: No Patient reminded to bring medication bottles to visit (not a list of meds) OR have medication bottles pulled out prior to appointment time: Done  Disease Assessments  Subjective Information Current BP: 138/88 Current HR: 73 taken on: 07/18/2021 Weight: 149 BMI: 28.23 kg/m Last GFR: 61 taken on: 02/18/2021 Why did the patient present?: CCM F/u visit Who does the patient spend their time with and what do they do?: Patient likes to go to church and play the piano. Lifestyle habits such as diet and exercise?: Patient reports she is limited in exercise currently due to continued eye surgeries to get her vision corrected.  Is Patient using [facility]?: No [facility] Current Rx insurance plan: [employer] Are meds synced by current pharmacy?: Yes Are meds delivered by current pharmacy?: No - delivery available but patient prefers to not use Would patient benefit from direct intervention of clinical lead in dispensing process to optimize clinical outcomes?: Yes Are [facility] pharmacy services available where patient lives?: Yes Is patient disadvantaged to use [facility]?: No [facility] services reviewed with patient and patient wishes to change pharmacy?: No Select reason  patient declined to change  pharmacies: Patient preference Does patient experience delays in picking up medications due to transportation concerns (getting to pharmacy)?: No  Hypertension (HTN) Assess this condition today?: Yes Is patient able to obtain BP reading today?: Yes BP today is: 138/78 Heart Rate is: 73 Goal: <130/80 mmHG Hypertension Stage: Stage 1 (SBP: 130-139 or DBP: 80-89) Is Patient checking BP at home?: Yes Patient home BP readings are ranging: 130s/80s How often does patient miss taking their blood pressure medications?: No missed doses Has patient experienced hypotension, dizziness, falls or bradycardia?: No Check present secondary causes (below) for HTN: Sleep Apnea Does Patient use RPM device?: No BP RPM device: Does patient qualify?: No We discussed: DASH diet:  following a diet emphasizing fruits and vegetables and low-fat dairy products along with whole grains, fish, poultry, and nuts. Reducing red meats and sugars., Targeting 150 minutes of aerobic activity per week, Recommend using a salt substitute to replace your salt if you need flavor., Proper Home BP Measurement Assessment:: Controlled Drug: Lisinopril 10mg  1 tab daily Assessment: Appropriate, Effective, Safe, Accessible Plan to: Continue current medication therapy; Continue to check blood pressure daily HC Follow up: 3 months Pharmacist Follow up: 04/06/22  Hyperlipidemia/Dyslipidemia (HLD) Last Lipid panel on: 02/18/2021 TC (Goal<200): 203 LDL: 127 HDL (Goal>40): 48 TG (Goal<150): 158 ASCVD 10-year risk?is:: High (>20%) ASCVD Risk Score: 27.2% Assess this condition today?: Yes LDL Goal: <100 Has patient tried and failed any HLD Medications?: Yes We discussed: How to reduce cholesterol through diet/weight management and physical activity., How a diet high in plant sterols (fruits/vegetables/nuts/whole grains/legumes) may reduce your cholesterol. Assessment:: Uncontrolled Drug: Fenofibrate 145mg  1 tablet daily Assessment:  Appropriate, Effective, Safe, Accessible Additional Info: Patient states she has failed statins in past, cannot find record in chart. States she had muscle aches. Due for updated lipid panel.  Plan to: Continue current medication therapy; Get updated lipid panel and may need to consider addition of zetia if elevated HC Follow up: 2 months Pharmacist Follow up: 03/27/22   Exercise, Diet and Non-Drug Coordination Needs Additional exercise counseling points. We discussed: decreasing sedentary behavior Additional diet counseling points. We discussed: key components of the DASH diet Discussed Non-Drug Care Coordination Needs: Yes Does Patient have Medication financial barriers?: No  [facility] Health-Related Social Needs Screening Tool -  SDOH  What is your living situation today? (ref #1): I have a steady place to live Think about the place you live. Do you have problems with any of the following? (ref #2): None of the above Within the past 12 months, you worried that your food would run out before you got money to buy more (ref #3): Never true Within the past 12 months, the food you bought just didn't last and you didn't have money to get more (ref #4): Never true In the past 12 months, has lack of reliable transportation kept you from medical appointments, meetings, work or from getting things needed for daily living? (ref #5): No In the past 12 months, has the electric, gas, oil, or water company threatened to shut off services in your home? (ref #6): No How often does anyone, including family and friends, physically hurt you? (ref #7): Never (1) How often does anyone, including family and friends, insult or talk down to you? (ref #8): Never (1) How often does anyone, including friends and family, threaten you with harm? (ref #9): Never (1) How often does anyone, including family and friends, scream or curse at you? (ref #  10): Never (1)  COMPREHENSIVE CARE PLAN AND GOALS:      HYPERTENSION   OFFICE VISIT BLOOD PRESSURE:   Unable to assess due to telephone visit.Marland Kitchen   HOME BLOOD PRESSURE MONITORING: Takes nightly, last night was 138/78  MY GOAL BLOOD PRESSURE: <130/80 mmHg  CURRENT MEDICATION AND DOSING:  Lisinopril 10mg  - take 1 tab by mouth daily   BARRIERS TO ACHIEVING GOALS: None identified, blood pressure is within goal   PLAN:  Continue current medications as prescribed.      CHOLESTEROL   MOST RECENT LABS: 02/19/21  TOTAL CHOLESTEROL:  203  TRIGLYCERIDES: 158  HDL: 48  LDL:  127  CURRENT MEDICATION AND DOSING:  Fenofibrate 145mg  - take 1 tab by mouth daily   THE GOALS WE HAVE CHOSEN ARE:  Total Cholesterol goal under 200, Triglycerides goal under 150, HDL above 50 (women), LDL goal under 100    BARRIERS TO ACHIEVING GOALS: Patient reports lack of exercise due to vision problems; Statin failure due to muscle cramps  PLAN TO WORK ON THESE GOALS: Continue current medications as prescribed.  Try to stay active as vision improves.     GERD   CURRENT REGIMEN AND DOSING:    Omeprazole 40mg  1 capsule by mouth daily  THE GOALS WE HAVE CHOSEN ARE:  Minimize reflux symptoms   BARRIERS TO ACHIEVING GOALS: None identified  PLAN TO WORK ON THESE GOALS:  Continue current medication as prescribed. Take measures to prevent acid reflux, such as avoiding spicy foods, avoiding caffeine, and raising the head of the bed.          HEART FAILURE  TYPE OF HEART FAILURE:    Diastolic  LAST EJECTION FRACTION/DATE:     64% on 06/12/20                                              CURRENT MEDICATION AND DOSING: Lisinopril 10mg  - take 1 tab by mouth daily   THE GOALS WE HAVE CHOSEN ARE:  Check weight daily and contact physician if weight gain of more than 3 pounds overnight or more than 5 pounds in a week.   BARRIERS TO ACHIEVING GOALS: None identified   PLAN TO WORK ON THESE GOALS: Continue current medications as prescribed.     ASTHMA  CURRENT MEDICATION AND  DOSING: Albuterol 0.083% nebulizing solution 1 vial every 6 hours as needed Albuterol HFA 2 puffs every 6 hours  Cetirizine 10mg  1 tablet by mouth daily Flovent 159mcg inhaler 2 puffs twice daily Atrovent 57mcg/puff 2 puffs four times daily Ipratropium 0.02% nebulizing solution 1 vial four times daily as needed Montelukast 10mg  1 tablet daily  THE GOALS WE HAVE CHOSEN ARE: Prevent worsening of shortness or breath and hospitalizations.   BARRIERS TO ACHIEVING GOALS: None identified  PLAN TO WORK ON THESE GOALS: Continue current medication therapy.    ANXIETY   CURRENT MEDICATION AND DOSING:  Alprazolam 0.25mg  - take 1 tab twice a day as needed for anxiety  Fluoxetine 40mg  - take 1 cap by mouth daily  THE GOALS WE HAVE CHOSEN ARE: Continue to see improvement in anxiety symptoms.  BARRIERS TO ACHIEVING GOALS: None identified   PLAN TO WORK ON THESE GOALS:  Continue current medications as prescribed.    HYPOTHYROIDISM  RECENT TSH/DATE:    02/19/2021 0.652 uIU/ml  CURRENT MEDICATION AND DOSING: Levothyroxine  174mcg 1 tablet daily every morning  THE GOALS WE HAVE CHOSEN ARE:  Maintain TSH between 0.45 to 4.5uIU/ml  BARRIERS TO ACHIEVING GOALS: None identified   PLAN TO WORK ON THESE GOALS:  Continue current medications as prescribed.    HEALTHY HABITS (Diet and exercise)  CURRENT DIET/EXERCISE: Limited exercise due to vision problems, will try to stay active as improves  THE GOALS WE HAVE CHOSEN ARE:   Stay active, engage in at least 150 minutes per week of moderate-intensity exercise such as brisk walking (15- to 20-minute mile) or something similar. Increase flexibility exercises, break up prolonged periods of sitting Increase seafood, lean meats, whole grains, legumes, nuts, fruits (for dessert), and vegetables. Limit salt intake, sugars, carbs, and red meat, refined and processed foods.  BARRIERS TO ACHIEVING GOALS:  Vision issues  PLAN TO WORK ON THESE GOALS:  Seeing eye doctor    CHRONIC PAIN SYNDROME  CURRENT MEDICATION AND DOSING: Acetaminophen 500mg  2 tabs two times daily as needed for mild or moderate pain Baclofen 10mg   1 tab twice daily as needed for back pain Cyclobenzaprine 5mg  1 to 2 tabs twice daily as needed Hydrocodone-Acetaminophen 5-300mg  1 tab twice daily for back pain   THE GOALS WE HAVE CHOSEN ARE:  Maintain pain control  BARRIERS TO ACHIEVING GOALS: None identified  PLAN TO WORK ON THESE GOALS:  Continue current medications as prescribed.    ACTIVE MEDICATION LIST  MEDICATION DOSE DIRECTIONS CONDITION NOTES  Acetaminophen 500mg  2 tabs twice daily as needed Pain   Albuterol solution 0.083% 1 vial every 6 hours as needed Asthma   Albuterol HFA 157mcg/puff 2 puffs every 6 hours as needed Asthma   Alprazolam 0.25mg  1 tab twice daily as needed Anxiety   Baclofen 10mg  1 tab twice daily as needed Pain   Dulcolax 5mg  1 to 2 tabs at bedtime as needed for moderate constipation IBS   Cetirizine 10mg  1 tab daily Allergies   Vitamin D 1000 units 1 tab daily at bedtime Supplement   Clindamycin solution 1% Apply twice daily as needed Skin   Cyclobenzaprine 5mg  1 to 2 tabs twice daily as needed Pain/spasm   Docusate 100mg  1 to 2 caps at bedtime as needed for constipation IBS   Estradiol cream 0.1mg  1 gram vaginally twice a week Dryness   Fenofibrate 145mg  1 tablet daily Cholesterol   Fluoxetine 40mg  1 cap daily Anxiety   Flovent  184mcg 2 puffs twice daily Asthma   Hydrocodone/Acetaminophen 5/300mg  1 tab twice daily as needed Pain   Atrovent HFA 45mcg/puff 2 puffs four times daily Asthma   Ipratropium solution  0.2% 1 vial four times as needed via nebulizer Asthma   Midrin 65-100-325mg  1 four times daily as needed Migraines   Levothyroxine 170mcg 1 tab daily every morning Thyroid   Lisinopril 10mg  1 tab daily Blood pressure   Magnesium Oxide 400mg  1 tab at bedtime Supplement   Montelukast 10mg  1 tab daily Asthma   Omeprazole 40mg  1 tab daily  GERD    Miralax 17gm/scoop 1 scoop every morning IBS   Triamcinolone cream 1% Apply two times daily as needed Skin   Vitamin B12 1052mcg 1 tab daily supplement    MEDICATION REVIEW  MEDICATION REVIEW CONDUCTED:   Yes DATE:  10/27/2021 BEST POSSIBLE MEDICATION HISTORY SOURCE:   Medical Records  [facility]: Vials   ALLERGIES/INTOLERANCES   NAME OF MEDICATION  REACTION    Advair Diskus Asthma attack  Codeine Unknown  Erythromycin  Stomach pain  Morphine Itching/nausea/vomiting  Sulfa unknown   CURRENT HEALTHCARE PROVIDER TEAM   PROVIDER/TEAM MEMBER  ROLE  PHONE NUMBER  COMMENTS    [facility]ign[facility] Primary Care Provider [phone]    [place], [place] Pharmacist [phone]                 Fat and Cholesterol Restricted Eating Plan Getting too much fat and cholesterol in your diet may cause health problems. Choosing the right foods helps keep your fat and cholesterol at normal levels. This can keep you from getting certain diseases. Your doctor may recommend an eating plan that includes: Total fat: ______% or less of total calories a day. This is ______g of fat a day. Saturated fat: ______% or less of total calories a day. This is ______g of saturated fat a day. Cholesterol: less than _________mg a day. Fiber: ______g a day. What are tips for following this plan? General tips Work with your doctor to lose weight if you need to. Avoid: Foods with added sugar. Fried foods. Foods with trans fat or partially hydrogenated oils. This includes some margarines and baked goods. If you drink alcohol: Limit how much you have to: 0-1 drink a day for women who are not pregnant. 0-2 drinks a day for men. Know how much alcohol is in a drink. In the [place] one drink equals one 12 oz bottle of beer (355 mL), one 5 oz glass of wine (148 mL), or one 1 oz glass of hard liquor (44 mL). Reading food labels Check food labels for: Trans fats. Partially hydrogenated oils. Saturated  fat (g) in each serving. Cholesterol (mg) in each serving. Fiber (g) in each serving. Choose foods with healthy fats, such as: Monounsaturated fats and polyunsaturated fats. These include olive and canola oil, flaxseeds, walnuts, almonds, and seeds. Omega-3 fats. These are found in certain fish, flaxseed oil, and ground flaxseeds. Choose grain products that have whole grains. Look for the word "whole" as the first word in the ingredient list. Cooking Cook foods using low-fat methods. These include baking, boiling, grilling, and broiling. Eat more home-cooked foods. Eat at restaurants and buffets less often. Eat less fast food. Avoid cooking using saturated fats, such as butter, cream, palm oil, palm kernel oil, and coconut oil. Meal planning At meals, divide your plate into four equal parts: Fill one-half of your plate with vegetables, green salads, and fruit. Fill one-fourth of your plate with whole grains. Fill one-fourth of your plate with low-fat (lean) protein foods. Eat fish that is high in omega-3 fats at least two times a week. This includes mackerel, tuna, sardines, and salmon. Eat foods that are high in fiber, such as whole grains, beans, apples, pears, berries, broccoli, carrots, peas, and barley. What foods should I eat? Fruits All fresh, canned (in natural juice), or frozen fruits. Vegetables Fresh or frozen vegetables (raw, steamed, roasted, or grilled). Green salads. Grains Whole grains, such as whole wheat or whole grain breads, crackers, cereals, and pasta. Unsweetened oatmeal, bulgur, barley, quinoa, or brown rice. Corn or whole wheat flour tortillas. Meats and other protein foods Ground beef (85% or leaner), grass-fed beef, or beef trimmed of fat. Skinless chicken or [place]. Ground chicken or [place]. Pork trimmed of fat. All fish and seafood. Egg whites. Dried beans, peas, or lentils. Unsalted nuts or seeds. Unsalted canned beans. Nut butters without added sugar or  oil. Dairy Low-fat or nonfat dairy products, such as skim or 1% milk, 2% or reduced-fat cheeses, low-fat and  fat-free ricotta or cottage cheese, or plain low-fat and nonfat yogurt. Fats and oils Tub margarine without trans fats. Light or reduced-fat mayonnaise and salad dressings. Avocado. Olive, canola, sesame, or safflower oils. The items listed above may not be a complete list of foods and beverages you can eat. Contact a dietitian for more information. What foods should I avoid? Fruits Canned fruit in heavy syrup. Fruit in cream or butter sauce. Fried fruit. Vegetables Vegetables cooked in cheese, cream, or butter sauce. Fried vegetables. Grains White bread. White pasta. White rice. Cornbread. Bagels, pastries, and croissants. Crackers and snack foods that contain trans fat and hydrogenated oils. Meats and other protein foods Fatty cuts of meat. Ribs, chicken wings, bacon, sausage, bologna, salami, chitterlings, fatback, hot dogs, bratwurst, and packaged lunch meats. Liver and organ meats. Whole eggs and egg yolks. Chicken and [place] with skin. Fried meat. Dairy Whole or 2% milk, cream, half-and-half, and cream cheese. Whole milk cheeses. Whole-fat or sweetened yogurt. Full-fat cheeses. Nondairy creamers and whipped toppings. Processed cheese, cheese spreads, and cheese curds. Fats and oils Butter, stick margarine, lard, shortening, ghee, or bacon fat. Coconut, palm kernel, and palm oils. Beverages Alcohol. Sugar-sweetened drinks such as sodas, lemonade, and fruit drinks. Sweets and desserts Corn syrup, sugars, honey, and molasses. Candy. Jam and jelly. Syrup. Sweetened cereals. Cookies, pies, cakes, donuts, muffins, and ice cream. The items listed above may not be a complete list of foods and beverages you should avoid. Contact a dietitian for more information. Summary Choosing the right foods helps keep your fat and cholesterol at normal levels. This can keep you from getting certain  diseases. At meals, fill one-half of your plate with vegetables, green salads, and fruits. Eat high fiber foods, like whole grains, beans, apples, pears, berries, carrots, peas, and barley. Limit added sugar, saturated fats, alcohol, and fried foods. This information is not intended to replace advice given to you by your health care provider. Make sure you discuss any questions you have with your health care provider.   [name] Clinical Pharmacist [phone]

## 2021-10-28 ENCOUNTER — Telehealth: Payer: Self-pay | Admitting: Student-PharmD

## 2021-10-28 NOTE — Progress Notes (Signed)
  Chronic Care Management Pharmacy Assistant   Name: Bonnie West  MRN: [id number] DOB: Dec 02, 1950  Reason for Encounter: CCM Care Plan  Medications: Outpatient Encounter Medications as of 10/28/2021  Medication Sig   acetaminophen (TYLENOL) 500 MG tablet Take 1,000 mg by mouth 2 (two) times daily as needed for mild pain or moderate pain.   albuterol (PROVENTIL) (2.5 MG/3ML) 0.083% nebulizer solution Take 3 mLs (2.5 mg total) by nebulization every 6 (six) hours as needed for wheezing or shortness of breath.   albuterol (VENTOLIN HFA) 108 (90 Base) MCG/ACT inhaler INHALE 2 PUFFS BY MOUTH EVERY 6 HOURS   ALPRAZolam (XANAX) 0.25 MG tablet TAKE 1 TABLET BY MOUTH TWICE A DAY AS NEEDED FOR ANXIETY   baclofen (LIORESAL) 10 MG tablet TAKE 1 TABLET BY MOUTH TWICE A DAY AS NEEDED BACK PAIN   bisacodyl (DULCOLAX) 5 MG EC tablet Take 5-10 mg by mouth at bedtime as needed for moderate constipation. Two at night   cetirizine (ZYRTEC) 10 MG tablet Take 10 mg by mouth daily.   cholecalciferol (VITAMIN D) 1000 units tablet Take 1,000 Units by mouth at bedtime.   clindamycin (CLEOCIN T) 1 % external solution APPLY 1 APPLICATION TOPICALLY 2 (TWO) TIMES DAILY AS NEEDED. FOR SKIN BREAKDOWN   cyclobenzaprine (FLEXERIL) 5 MG tablet Take 5-10 mg by mouth 2 (two) times daily as needed.   docusate sodium (COLACE) 100 MG capsule Take 100-200 mg by mouth at bedtime as needed for mild constipation or moderate constipation.    estradiol (ESTRACE) 0.1 MG/GM vaginal cream INSERT 1 GRAM VAGINALLY TWICE WEEKLY AT BEDTIME   fenofibrate (TRICOR) 145 MG tablet Take 1 tablet (145 mg total) by mouth daily.   FLUoxetine (PROZAC) 40 MG capsule Take 1 capsule (40 mg total) by mouth daily.   fluticasone (FLOVENT HFA) 110 MCG/ACT inhaler USE 2 PUFFS INHALED TWICE DAILY   HYDROcodone-Acetaminophen 5-300 MG TABS Take one tab po bid for back pain   ipratropium (ATROVENT HFA) 17 MCG/ACT inhaler INHALE 2 PUFFS 4 TIMES DAILY   ipratropium  (ATROVENT) 0.02 % nebulizer solution Use 1 vial nebulized QID prn shortness of breath/wheezing.   isometheptene-acetaminophen-dichloralphenazone (MIDRIN) 65-100-325 MG capsule Take 1 capsule by mouth 4 (four) times daily as needed for migraine. Maximum 5 capsules in 12 hours for migraine headaches, 8 capsules in 24 hours for tension headaches.   levothyroxine (SYNTHROID) 100 MCG tablet TAKE 1 TABLET BY MOUTH EVERY DAY IN THE MORNING   lisinopril (ZESTRIL) 10 MG tablet TAKE 1 TABLET BY MOUTH EVERY DAY   magnesium oxide (MAG-OX) 400 MG tablet Take 400 mg by mouth at bedtime.    montelukast (SINGULAIR) 10 MG tablet Take 1 tablet (10 mg total) by mouth daily.   omeprazole (PRILOSEC) 40 MG capsule TAKE 1 CAPSULE BY MOUTH EVERY DAY   polyethylene glycol powder (GLYCOLAX/MIRALAX) 17 GM/SCOOP powder Take 17 g by mouth every evening.   triamcinolone cream (KENALOG) 0.1 % Apply 1 application topically 2 (two) times daily as needed. For skin breakdown   vitamin B-12 (CYANOCOBALAMIN) 1000 MCG tablet Take 1,000 mcg by mouth daily.   No facility-administered encounter medications on file as of 10/28/2021.   Reviewed the patients initial visit reinsured it was completed per the pharmacist [name] request. Printed the CCM care plan. Mailed the patient CCM care plan to their most recent address on file.  Follow-Up:Pharmacist Review  Charlann Lange, [place] Pharmacist Assistant [phone]

## 2021-11-03 ENCOUNTER — Telehealth: Payer: Self-pay

## 2021-11-03 NOTE — Telephone Encounter (Signed)
Left vm to confirm 11/04/21 appointment-[name]

## 2021-11-04 ENCOUNTER — Ambulatory Visit (INDEPENDENT_AMBULATORY_CARE_PROVIDER_SITE_OTHER): Payer: Medicare Other | Admitting: Nurse Practitioner

## 2021-11-04 ENCOUNTER — Encounter: Payer: Self-pay | Admitting: Nurse Practitioner

## 2021-11-04 ENCOUNTER — Other Ambulatory Visit: Payer: Self-pay

## 2021-11-04 VITALS — BP 139/68 | HR 74 | Temp 98.3°F | Resp 16 | Ht 61.0 in | Wt 153.2 lb

## 2021-11-04 DIAGNOSIS — E782 Mixed hyperlipidemia: Secondary | ICD-10-CM | POA: Diagnosis not present

## 2021-11-04 DIAGNOSIS — E039 Hypothyroidism, unspecified: Secondary | ICD-10-CM

## 2021-11-04 DIAGNOSIS — F411 Generalized anxiety disorder: Secondary | ICD-10-CM

## 2021-11-04 DIAGNOSIS — Z1231 Encounter for screening mammogram for malignant neoplasm of breast: Secondary | ICD-10-CM | POA: Diagnosis not present

## 2021-11-04 DIAGNOSIS — R300 Dysuria: Secondary | ICD-10-CM | POA: Diagnosis not present

## 2021-11-04 DIAGNOSIS — M545 Low back pain, unspecified: Secondary | ICD-10-CM | POA: Diagnosis not present

## 2021-11-04 DIAGNOSIS — E559 Vitamin D deficiency, unspecified: Secondary | ICD-10-CM

## 2021-11-04 DIAGNOSIS — J4540 Moderate persistent asthma, uncomplicated: Secondary | ICD-10-CM

## 2021-11-04 DIAGNOSIS — Z0001 Encounter for general adult medical examination with abnormal findings: Secondary | ICD-10-CM

## 2021-11-04 DIAGNOSIS — I10 Essential (primary) hypertension: Secondary | ICD-10-CM | POA: Diagnosis not present

## 2021-11-04 MED ORDER — ALPRAZOLAM 0.25 MG PO TABS: 0.2500 mg | ORAL_TABLET | Freq: Two times a day (BID) | ORAL | 2 refills | Status: DC | PRN

## 2021-11-04 MED ORDER — FENOFIBRATE 145 MG PO TABS
145.0000 mg | ORAL_TABLET | Freq: Every day | ORAL | 1 refills | Status: DC
Start: 2021-11-04 — End: 2022-02-04

## 2021-11-04 MED ORDER — HYDROCODONE-ACETAMINOPHEN 5-300 MG PO TABS: 1.0000 | ORAL_TABLET | Freq: Two times a day (BID) | ORAL | 0 refills | Status: DC | PRN

## 2021-11-04 MED ORDER — ATROVENT HFA 17 MCG/ACT IN AERS: INHALATION_SPRAY | RESPIRATORY_TRACT | 3 refills | Status: DC

## 2021-11-04 MED ORDER — FLUOXETINE HCL 40 MG PO CAPS
40.0000 mg | ORAL_CAPSULE | Freq: Every day | ORAL | 1 refills | Status: DC
Start: 2021-11-04 — End: 2022-05-09

## 2021-11-04 MED ORDER — BACLOFEN 10 MG PO TABS: 10.0000 mg | ORAL_TABLET | Freq: Two times a day (BID) | ORAL | 3 refills | Status: DC | PRN

## 2021-11-04 NOTE — Progress Notes (Signed)
[facility] [place], [place] [address]  Internal MEDICINE  Office Visit Note  Patient Name: Bonnie West  [id number]  [id number]  Date of Service: 11/04/2021  Chief Complaint  Patient presents with   Medicare Wellness   Depression   Gastroesophageal Reflux   Hyperlipidemia   Hypertension   Anxiety   Asthma   Medication Refill    HPI Bonnie West presents for an annual well visit and physical exam. She is a well appearing 70 yo female with chronic medical problems including GERD, hypertension, hyperlipidemia, and anxiety. Her Estimated 10-year ASCVD risk is 22%. -Her blood pressure is wnl today.  She is taking lisinopril 10 mg daily for her hypertension.  She is on no other antihypertensive medications at this time. -Depression and anxiety stable with current medications. -Gastroesophageal reflux stable with current medications. -Obstructive sleep apnea, patient using CPAP, states it is improving. -Asthma is controlled with current medications. She is due for routine labs, routine mammogram and medication refills.  She denies any new or worsening pains. She has no other questions or concerns     Current Medication: Outpatient Encounter Medications as of 11/04/2021  Medication Sig   acetaminophen (TYLENOL) 500 MG tablet Take 1,000 mg by mouth 2 (two) times daily as needed for mild pain or moderate pain.   albuterol (PROVENTIL) (2.5 MG/3ML) 0.083% nebulizer solution Take 3 mLs (2.5 mg total) by nebulization every 6 (six) hours as needed for wheezing or shortness of breath.   albuterol (VENTOLIN HFA) 108 (90 Base) MCG/ACT inhaler INHALE 2 PUFFS BY MOUTH EVERY 6 HOURS   bisacodyl (DULCOLAX) 5 MG EC tablet Take 5-10 mg by mouth at bedtime as needed for moderate constipation. Two at night   cetirizine (ZYRTEC) 10 MG tablet Take 10 mg by mouth daily.   cholecalciferol (VITAMIN D) 1000 units tablet Take 1,000 Units by mouth at bedtime.   clindamycin (CLEOCIN T) 1  % external solution APPLY 1 APPLICATION TOPICALLY 2 (TWO) TIMES DAILY AS NEEDED. FOR SKIN BREAKDOWN   cyclobenzaprine (FLEXERIL) 5 MG tablet Take 5-10 mg by mouth 2 (two) times daily as needed.   docusate sodium (COLACE) 100 MG capsule Take 100-200 mg by mouth at bedtime as needed for mild constipation or moderate constipation.    estradiol (ESTRACE) 0.1 MG/GM vaginal cream INSERT 1 GRAM VAGINALLY TWICE WEEKLY AT BEDTIME   fluticasone (FLOVENT HFA) 110 MCG/ACT inhaler USE 2 PUFFS INHALED TWICE DAILY   ipratropium (ATROVENT) 0.02 % nebulizer solution Use 1 vial nebulized QID prn shortness of breath/wheezing.   isometheptene-acetaminophen-dichloralphenazone (MIDRIN) 65-100-325 MG capsule Take 1 capsule by mouth 4 (four) times daily as needed for migraine. Maximum 5 capsules in 12 hours for migraine headaches, 8 capsules in 24 hours for tension headaches.   levothyroxine (SYNTHROID) 100 MCG tablet TAKE 1 TABLET BY MOUTH EVERY DAY IN THE MORNING   lisinopril (ZESTRIL) 10 MG tablet TAKE 1 TABLET BY MOUTH EVERY DAY   magnesium oxide (MAG-OX) 400 MG tablet Take 400 mg by mouth at bedtime.    montelukast (SINGULAIR) 10 MG tablet Take 1 tablet (10 mg total) by mouth daily.   omeprazole (PRILOSEC) 40 MG capsule TAKE 1 CAPSULE BY MOUTH EVERY DAY   polyethylene glycol powder (GLYCOLAX/MIRALAX) 17 GM/SCOOP powder Take 17 g by mouth every evening.   triamcinolone cream (KENALOG) 0.1 % Apply 1 application topically 2 (two) times daily as needed. For skin breakdown   vitamin B-12 (CYANOCOBALAMIN) 1000 MCG tablet Take 1,000 mcg by mouth  daily.   [DISCONTINUED] ALPRAZolam (XANAX) 0.25 MG tablet TAKE 1 TABLET BY MOUTH TWICE A DAY AS NEEDED FOR ANXIETY   [DISCONTINUED] baclofen (LIORESAL) 10 MG tablet TAKE 1 TABLET BY MOUTH TWICE A DAY AS NEEDED BACK PAIN   [DISCONTINUED] fenofibrate (TRICOR) 145 MG tablet Take 1 tablet (145 mg total) by mouth daily.   [DISCONTINUED] FLUoxetine (PROZAC) 40 MG capsule Take 1 capsule  (40 mg total) by mouth daily.   [DISCONTINUED] HYDROcodone-Acetaminophen 5-300 MG TABS Take one tab po bid for back pain   [DISCONTINUED] ipratropium (ATROVENT HFA) 17 MCG/ACT inhaler INHALE 2 PUFFS 4 TIMES DAILY   ALPRAZolam (XANAX) 0.25 MG tablet Take 1 tablet (0.25 mg total) by mouth 2 (two) times daily as needed for anxiety.   baclofen (LIORESAL) 10 MG tablet Take 1 tablet (10 mg total) by mouth 2 (two) times daily as needed for muscle spasms.   fenofibrate (TRICOR) 145 MG tablet Take 1 tablet (145 mg total) by mouth daily.   FLUoxetine (PROZAC) 40 MG capsule Take 1 capsule (40 mg total) by mouth daily.   HYDROcodone-Acetaminophen 5-300 MG TABS Take 1 tablet by mouth 2 (two) times daily as needed (back pain). Take one tab po bid for back pain   ipratropium (ATROVENT HFA) 17 MCG/ACT inhaler INHALE 2 PUFFS 4 TIMES DAILY   No facility-administered encounter medications on file as of 11/04/2021.    Surgical History: Past Surgical History:  Procedure Laterality Date   BUNIONECTOMY     CATARACT EXTRACTION W/PHACO Right 10/11/2016   Procedure: CATARACT EXTRACTION PHACO AND INTRAOCULAR LENS PLACEMENT (IOC);  Surgeon: Birder Robson, MD;  Location: [facility] ORS;  Service: Ophthalmology;  Laterality: Right;  Lot# [id number] H [place]: 00:37.7 AP%: 18.1 CDE: 6.80   CATARACT EXTRACTION W/PHACO Left 11/08/2016   Procedure: CATARACT EXTRACTION PHACO AND INTRAOCULAR LENS PLACEMENT ([place]);  Surgeon: Birder Robson, MD;  Location: [facility] ORS;  Service: Ophthalmology;  Laterality: Left;  PACK LOT: [id number] H US:00:32 AP:44 CDE:6.46   COLONOSCOPY     COLONOSCOPY WITH PROPOFOL N/A 11/26/2015   Procedure: COLONOSCOPY WITH PROPOFOL;  Surgeon: Manya Silvas, MD;  Location: [facility] ENDOSCOPY;  Service: Endoscopy;  Laterality: N/A;   EYE SURGERY  07/23/2021   EYE SURGERY Left 09/24/2021   strabusmis, double vision   TONSILLECTOMY      Medical History: Past Medical History:  Diagnosis Date   Allergic rhinitis     Anxiety    Asthma    Blood transfusion without reported diagnosis    Depression    Diverticulosis    Dyspnea    Esophagitis    GERD (gastroesophageal reflux disease)    Headache    Heart murmur    Hyperlipemia    Hypertension    Hypothyroidism    Obesity    Osteopenia    Palpitations    Pneumothorax    Sleep apnea     Family History: Family History  Problem Relation Age of Onset   Breast cancer Maternal Grandmother    Stroke Maternal Grandmother    Bladder Cancer Father    Prostate cancer Father    Heart attack Father    Aortic aneurysm Father    Congestive Heart Failure Father    Colon cancer Paternal Grandmother    Aneurysm Paternal Grandmother    Stroke Paternal Grandmother    Congestive Heart Failure Paternal Grandmother    Dementia Mother    Stroke Mother    Dementia Maternal Grandfather    Stroke Maternal Grandfather    Dementia Paternal  Grandfather    Stroke Paternal Grandfather     Social History   Socioeconomic History   Marital status: Married    Spouse name: Not on file   Number of children: Not on file   Years of education: Not on file   Highest education level: Not on file  Occupational History   Not on file  Tobacco Use   Smoking status: Former   Smokeless tobacco: Never  Vaping Use   Vaping Use: Never used  Substance and Sexual Activity   Alcohol use: No   Drug use: No   Sexual activity: Not Currently    Partners: Male    Birth control/protection: Post-menopausal  Other Topics Concern   Not on file  Social History Narrative   Not on file   Social Determinants of Health   Financial Resource Strain: Low Risk    Difficulty of Paying Living Expenses: Not very hard  Food Insecurity: Not on file  Transportation Needs: Not on file  Physical Activity: Not on file  Stress: Not on file  Social Connections: Not on file  Intimate Partner Violence: Not on file      Review of Systems  Constitutional:  Negative for activity change,  appetite change, chills, fatigue, fever and unexpected weight change.  HENT: Negative.  Negative for congestion, ear pain, rhinorrhea, sore throat and trouble swallowing.   Eyes: Negative.   Respiratory: Negative.  Negative for cough, chest tightness, shortness of breath and wheezing.   Cardiovascular: Negative.  Negative for chest pain.  Gastrointestinal: Negative.  Negative for abdominal pain, blood in stool, constipation, diarrhea, nausea and vomiting.  Endocrine: Negative.   Genitourinary: Negative.  Negative for difficulty urinating, dysuria, frequency, hematuria and urgency.  Musculoskeletal: Negative.  Negative for arthralgias, back pain, joint swelling, myalgias and neck pain.  Skin: Negative.  Negative for rash and wound.  Allergic/Immunologic: Negative.  Negative for immunocompromised state.  Neurological: Negative.  Negative for dizziness, seizures, numbness and headaches.  Hematological: Negative.   Psychiatric/Behavioral: Negative.  Negative for behavioral problems, self-injury and suicidal ideas. The patient is not nervous/anxious.    Vital Signs: BP 139/68    Pulse 74    Temp 98.3 F (36.8 C)    Resp 16    Ht 5' 1"  (1.549 m)    Wt 153 lb 3.2 oz (69.5 kg)    SpO2 98%    BMI 28.95 kg/m    Physical Exam Vitals reviewed.  Constitutional:      General: She is not in acute distress.    Appearance: Normal appearance. She is well-developed and normal weight. She is not ill-appearing or diaphoretic.  HENT:     Head: Normocephalic and atraumatic.     Right Ear: Tympanic membrane, ear canal and external ear normal.     Left Ear: Tympanic membrane, ear canal and external ear normal.     Nose: Nose normal. No congestion or rhinorrhea.     Mouth/Throat:     Mouth: Mucous membranes are moist.     Pharynx: Oropharynx is clear. No oropharyngeal exudate or posterior oropharyngeal erythema.  Eyes:     General: No scleral icterus.       Right eye: No discharge.        Left eye: No  discharge.     Extraocular Movements: Extraocular movements intact.     Conjunctiva/sclera: Conjunctivae normal.     Pupils: Pupils are equal, round, and reactive to light.  Neck:     Thyroid: No thyromegaly.  Vascular: No JVD.     Trachea: No tracheal deviation.  Cardiovascular:     Rate and Rhythm: Normal rate and regular rhythm.     Pulses: Normal pulses.     Heart sounds: Normal heart sounds. No murmur heard.   No friction rub. No gallop.  Pulmonary:     Effort: Pulmonary effort is normal. No respiratory distress.     Breath sounds: Normal breath sounds. No stridor. No wheezing or rales.  Chest:     Chest wall: No tenderness.     Comments: Declined clinical breast exam, routine mammogram ordered.  Abdominal:     General: Bowel sounds are normal. There is no distension.     Palpations: Abdomen is soft. There is no mass.     Tenderness: There is no abdominal tenderness. There is no guarding or rebound.  Musculoskeletal:        General: No tenderness or deformity. Normal range of motion.     Cervical back: Normal range of motion and neck supple.  Lymphadenopathy:     Cervical: No cervical adenopathy.  Skin:    General: Skin is warm and dry.     Coloration: Skin is not pale.     Findings: No erythema or rash.  Neurological:     Mental Status: She is alert and oriented to person, place, and time.     Cranial Nerves: No cranial nerve deficit.     Motor: No abnormal muscle tone.     Coordination: Coordination normal.     Gait: Gait normal.     Deep Tendon Reflexes: Reflexes are normal and symmetric.  Psychiatric:        Mood and Affect: Mood normal.        Behavior: Behavior normal.        Thought Content: Thought content normal.        Judgment: Judgment normal.       Assessment/Plan: 1. Encounter for general adult medical examination with abnormal findings Age-appropriate preventive screenings and vaccinations discussed, annual physical exam completed. Routine  labs for health maintenance ordered, see below. PHM updated.   2. Essential hypertension, benign Stable, routine labs ordered - CMP14+EGFR - CBC with Differential/Platelet  3. Moderate persistent asthma without status asthmaticus without complication Stable, inhaler reordered - ipratropium (ATROVENT HFA) 17 MCG/ACT inhaler; INHALE 2 PUFFS 4 TIMES DAILY  Dispense: 1 each; Refill: 3  4. Mixed hyperlipidemia Fenofibrate refills ordered. Routine labs ordered. - fenofibrate (TRICOR) 145 MG tablet; Take 1 tablet (145 mg total) by mouth daily.  Dispense: 90 tablet; Refill: 1 - CMP14+EGFR - CBC with Differential/Platelet - Lipid Profile  5. Acquired hypothyroidism Routine labs ordered.  - TSH + free T4 - CMP14+EGFR - CBC with Differential/Platelet  6. Vitamin D deficiency Routine labs ordered - Vitamin D (25 hydroxy) - CMP14+EGFR - CBC with Differential/Platelet  7. Acute midline low back pain without sciatica Refills ordred, routine labs ordered.  - HYDROcodone-Acetaminophen 5-300 MG TABS; Take 1 tablet by mouth 2 (two) times daily as needed (back pain). Take one tab po bid for back pain  Dispense: 30 tablet; Refill: 0 - baclofen (LIORESAL) 10 MG tablet; Take 1 tablet (10 mg total) by mouth 2 (two) times daily as needed for muscle spasms.  Dispense: 60 tablet; Refill: 3 - CMP14+EGFR - CBC with Differential/Platelet  8. Dysuria Routine urinalysis done  - UA/M w/rflx Culture, Routine - Microscopic Examination  9. GAD (generalized anxiety disorder) Stable, refills ordered - ALPRAZolam (XANAX) 0.25 MG tablet;  Take 1 tablet (0.25 mg total) by mouth 2 (two) times daily as needed for anxiety.  Dispense: 60 tablet; Refill: 2 - FLUoxetine (PROZAC) 40 MG capsule; Take 1 capsule (40 mg total) by mouth daily.  Dispense: 90 capsule; Refill: 1  10. Encounter for screening mammogram for malignant neoplasm of breast Routine mammogram ordered - MM 3D SCREEN BREAST BILATERAL;  Future      General Counseling: johnika escareno understanding of the findings of todays visit and agrees with plan of treatment. I have discussed any further diagnostic evaluation that may be needed or ordered today. We also reviewed her medications today. she has been encouraged to call the office with any questions or concerns that should arise related to todays visit.    Orders Placed This Encounter  Procedures   Microscopic Examination   Urine Culture, Reflex   MM 3D SCREEN BREAST BILATERAL   UA/M w/rflx Culture, Routine   TSH + free T4   Vitamin D (25 hydroxy)   CMP14+EGFR   CBC with Differential/Platelet   Lipid Profile    Meds ordered this encounter  Medications   HYDROcodone-Acetaminophen 5-300 MG TABS    Sig: Take 1 tablet by mouth 2 (two) times daily as needed (back pain). Take one tab po bid for back pain    Dispense:  30 tablet    Refill:  0   baclofen (LIORESAL) 10 MG tablet    Sig: Take 1 tablet (10 mg total) by mouth 2 (two) times daily as needed for muscle spasms.    Dispense:  60 tablet    Refill:  3   ALPRAZolam (XANAX) 0.25 MG tablet    Sig: Take 1 tablet (0.25 mg total) by mouth 2 (two) times daily as needed for anxiety.    Dispense:  60 tablet    Refill:  2    F41.0; may hae first fill on 11/10/21   fenofibrate (TRICOR) 145 MG tablet    Sig: Take 1 tablet (145 mg total) by mouth daily.    Dispense:  90 tablet    Refill:  1   FLUoxetine (PROZAC) 40 MG capsule    Sig: Take 1 capsule (40 mg total) by mouth daily.    Dispense:  90 capsule    Refill:  1   ipratropium (ATROVENT HFA) 17 MCG/ACT inhaler    Sig: INHALE 2 PUFFS 4 TIMES DAILY    Dispense:  1 each    Refill:  3    Return in about 3 months (around 02/02/2022) for F/U, med refill, Scherry Laverne PCP.   Total time spent:30 Minutes Time spent includes review of chart, medications, test results, and follow up plan with the patient.   [place] Controlled Substance Database was reviewed by me.  This  patient was seen by [name], FNP-C in collaboration with Dr. Clayborn Bigness as a part of collaborative care agreement.  Tamer Baughman R. Valetta Fuller, MSN, FNP-C Internal medicine

## 2021-11-10 LAB — MICROSCOPIC EXAMINATION
Bacteria, UA: NONE SEEN
Casts: NONE SEEN /lpf
RBC, Urine: NONE SEEN /hpf (ref 0–2)

## 2021-11-10 LAB — UA/M W/RFLX CULTURE, ROUTINE
Bilirubin, UA: NEGATIVE
Glucose, UA: NEGATIVE
Ketones, UA: NEGATIVE
Nitrite, UA: NEGATIVE
Protein,UA: NEGATIVE
RBC, UA: NEGATIVE
Specific Gravity, UA: 1.015 (ref 1.005–1.030)
Urobilinogen, Ur: 0.2 mg/dL (ref 0.2–1.0)
pH, UA: 7 (ref 5.0–7.5)

## 2021-11-10 LAB — URINE CULTURE, REFLEX

## 2021-11-11 ENCOUNTER — Other Ambulatory Visit: Payer: Self-pay | Admitting: Nurse Practitioner

## 2021-11-11 DIAGNOSIS — M545 Low back pain, unspecified: Secondary | ICD-10-CM

## 2021-11-19 DIAGNOSIS — I10 Essential (primary) hypertension: Secondary | ICD-10-CM | POA: Diagnosis not present

## 2021-11-19 DIAGNOSIS — E039 Hypothyroidism, unspecified: Secondary | ICD-10-CM | POA: Diagnosis not present

## 2021-11-19 DIAGNOSIS — K219 Gastro-esophageal reflux disease without esophagitis: Secondary | ICD-10-CM | POA: Diagnosis not present

## 2021-11-21 ENCOUNTER — Encounter: Payer: Self-pay | Admitting: Nurse Practitioner

## 2021-12-06 ENCOUNTER — Other Ambulatory Visit: Payer: Self-pay | Admitting: Nurse Practitioner

## 2021-12-06 DIAGNOSIS — M545 Low back pain, unspecified: Secondary | ICD-10-CM

## 2021-12-15 ENCOUNTER — Other Ambulatory Visit: Payer: Self-pay | Admitting: Nurse Practitioner

## 2021-12-15 DIAGNOSIS — L719 Rosacea, unspecified: Secondary | ICD-10-CM

## 2021-12-29 ENCOUNTER — Telehealth: Payer: Self-pay

## 2021-12-29 NOTE — Telephone Encounter (Signed)
Lvm to schedule annual pft-Bonnie West

## 2021-12-30 ENCOUNTER — Other Ambulatory Visit: Payer: Self-pay

## 2021-12-30 ENCOUNTER — Ambulatory Visit
Admission: RE | Admit: 2021-12-30 | Discharge: 2021-12-30 | Disposition: A | Discharge status: Home / Self Care | Payer: Medicare Other | Source: Ambulatory Visit | Attending: Nurse Practitioner | Admitting: Nurse Practitioner

## 2021-12-30 DIAGNOSIS — Z1231 Encounter for screening mammogram for malignant neoplasm of breast: Secondary | ICD-10-CM | POA: Diagnosis not present

## 2022-01-03 DIAGNOSIS — Z9889 Other specified postprocedural states: Secondary | ICD-10-CM | POA: Diagnosis not present

## 2022-01-03 DIAGNOSIS — H518 Other specified disorders of binocular movement: Secondary | ICD-10-CM | POA: Diagnosis not present

## 2022-01-03 DIAGNOSIS — H5052 Exophoria: Secondary | ICD-10-CM | POA: Diagnosis not present

## 2022-01-17 ENCOUNTER — Other Ambulatory Visit: Payer: Self-pay

## 2022-01-17 DIAGNOSIS — R0602 Shortness of breath: Secondary | ICD-10-CM

## 2022-01-24 ENCOUNTER — Ambulatory Visit: Payer: Medicare Other | Admitting: Internal Medicine

## 2022-01-26 ENCOUNTER — Other Ambulatory Visit: Payer: Self-pay

## 2022-01-26 ENCOUNTER — Ambulatory Visit: Payer: Medicare Other | Admitting: Internal Medicine

## 2022-01-26 DIAGNOSIS — R0602 Shortness of breath: Secondary | ICD-10-CM

## 2022-01-27 ENCOUNTER — Other Ambulatory Visit: Payer: Self-pay | Admitting: Internal Medicine

## 2022-01-27 DIAGNOSIS — E039 Hypothyroidism, unspecified: Secondary | ICD-10-CM

## 2022-01-28 NOTE — Telephone Encounter (Signed)
Pt needs follow up in April and then in august  ?

## 2022-01-31 ENCOUNTER — Ambulatory Visit: Payer: Medicare Other | Admitting: Internal Medicine

## 2022-02-01 ENCOUNTER — Telehealth: Payer: Self-pay | Admitting: Student-PharmD

## 2022-02-01 NOTE — Progress Notes (Addendum)
General Review Call  ? ?Bonnie West, Bonnie West [id number] ?66 years, Female  DOB: 12-12-1950  M: [phone] ? ?General Review (HC) ?Completed by Charlann Lange on 02/01/2022 ? ?Chart Review ?What recent interventions/DTPs have been made by any provider to improve the patient's conditions in the last 3 months?:  ?Consults: ?11/01/21 Ophthalmology [name], [name], MD. For Post-op Strabismus Surgery. No medication changes ?01/03/22 Ophthalmology [name], [name], MD. For Diplopia. No medication changes. ? ?Office Visit: ?11/04/21 [name], NP. For Encounter for general adult medical examination. CHANGED Alprazolam to 0.25 mg 2 times daily PRN. ? ?Any recent hospitalizations or ED visits since last visit with CPP?: No ? ?Adherence Review ?Adherence rates for STAR metric medications: Lisinopril 10 mg 10/29/21 90 DS ? ?Adherence rates for medications indicated for disease state being reviewed: Lisinopril 10 mg 10/29/21 90 DS ? ?Does the patient have >5 day gap between last estimated fill dates for any of the above medications?: No ? ?Disease State Questions ?Able to connect with the Patient?: Yes ? ?Did patient have any problems with their health recently?: No ? ?Did patient have any problems with their pharmacy?: No ? ?Does patient have any issues or side effects with their medications?: No ? ?Additional  information to pass to Patient's CPP?: Yes ? ?Note additional concerns and questions from Patient: Patient stated her inhaler is expensive but she was going to try to see if her PCP can change the tier on the inhalers first. I informed her that if this doesn't work to give [place] a call and we can try to get her some patient assistance. ? ?Anything we can do to help take better care of Patient?: No ? ?Engagement Notes ?West, Bonnie on 02/01/2022 01:39 PM ?[facility] Chart Review: 10 min 02/01/22 ?[facility] Assessment call time spent: 15 min 02/01/22 ? ?Pharmacist Review ?Adherence gaps identified?: No ?Drug Therapy  Problems identified?: No ?Assessment: Controlled ? ?Bonnie West ?Clinical Pharmacist ?[phone] ? ?

## 2022-02-04 ENCOUNTER — Telehealth: Payer: Self-pay

## 2022-02-04 ENCOUNTER — Ambulatory Visit (INDEPENDENT_AMBULATORY_CARE_PROVIDER_SITE_OTHER): Payer: Medicare Other | Admitting: Nurse Practitioner

## 2022-02-04 ENCOUNTER — Encounter: Payer: Self-pay | Admitting: Nurse Practitioner

## 2022-02-04 ENCOUNTER — Other Ambulatory Visit: Payer: Self-pay

## 2022-02-04 VITALS — BP 140/80 | HR 67 | Temp 98.1°F | Ht 61.0 in | Wt 152.6 lb

## 2022-02-04 DIAGNOSIS — F411 Generalized anxiety disorder: Secondary | ICD-10-CM | POA: Diagnosis not present

## 2022-02-04 DIAGNOSIS — Z79899 Other long term (current) drug therapy: Secondary | ICD-10-CM

## 2022-02-04 DIAGNOSIS — E559 Vitamin D deficiency, unspecified: Secondary | ICD-10-CM | POA: Diagnosis not present

## 2022-02-04 DIAGNOSIS — S0990XA Unspecified injury of head, initial encounter: Secondary | ICD-10-CM | POA: Diagnosis not present

## 2022-02-04 DIAGNOSIS — Z1231 Encounter for screening mammogram for malignant neoplasm of breast: Secondary | ICD-10-CM | POA: Diagnosis not present

## 2022-02-04 DIAGNOSIS — J4540 Moderate persistent asthma, uncomplicated: Secondary | ICD-10-CM | POA: Diagnosis not present

## 2022-02-04 DIAGNOSIS — E782 Mixed hyperlipidemia: Secondary | ICD-10-CM

## 2022-02-04 DIAGNOSIS — E039 Hypothyroidism, unspecified: Secondary | ICD-10-CM | POA: Diagnosis not present

## 2022-02-04 DIAGNOSIS — M545 Low back pain, unspecified: Secondary | ICD-10-CM | POA: Diagnosis not present

## 2022-02-04 DIAGNOSIS — Z9181 History of falling: Secondary | ICD-10-CM

## 2022-02-04 DIAGNOSIS — R300 Dysuria: Secondary | ICD-10-CM | POA: Diagnosis not present

## 2022-02-04 DIAGNOSIS — I10 Essential (primary) hypertension: Secondary | ICD-10-CM | POA: Diagnosis not present

## 2022-02-04 LAB — POCT URINE DRUG SCREEN
Methylenedioxyamphetamine: NOT DETECTED
POC Amphetamine UR: NOT DETECTED
POC BENZODIAZEPINES UR: POSITIVE — AB
POC Barbiturate UR: NOT DETECTED
POC Cocaine UR: NOT DETECTED
POC Ecstasy UR: NOT DETECTED
POC Marijuana UR: NOT DETECTED
POC Methadone UR: NOT DETECTED
POC Methamphetamine UR: NOT DETECTED
POC Opiate Ur: NOT DETECTED
POC Oxycodone UR: NOT DETECTED
POC PHENCYCLIDINE UR: NOT DETECTED
POC TRICYCLICS UR: NOT DETECTED

## 2022-02-04 MED ORDER — ALPRAZOLAM 0.25 MG PO TABS: 0.2500 mg | ORAL_TABLET | Freq: Two times a day (BID) | ORAL | 2 refills | Status: DC | PRN

## 2022-02-04 MED ORDER — IPRATROPIUM-ALBUTEROL 0.5-2.5 (3) MG/3ML IN SOLN: 3.0000 mL | RESPIRATORY_TRACT | 2 refills | Status: DC | PRN

## 2022-02-04 MED ORDER — HYDROCODONE-ACETAMINOPHEN 5-300 MG PO TABS: 1.0000 | ORAL_TABLET | Freq: Two times a day (BID) | ORAL | 0 refills | Status: DC | PRN

## 2022-02-04 MED ORDER — FENOFIBRATE 145 MG PO TABS: 145.0000 mg | ORAL_TABLET | Freq: Every day | ORAL | 1 refills | Status: DC

## 2022-02-04 NOTE — Telephone Encounter (Signed)
Patient scheduled for ct head on 02/09/22 @ 11:00 [place] .tat ?

## 2022-02-04 NOTE — Progress Notes (Signed)
[facility] ?[address] ?[place], [place] [address] ? ?Internal MEDICINE  ?Office Visit Note ? ?Patient Name: Bonnie West ? [id number]  ?[id number] ? ?Date of Service: 02/04/2022 ? ?Chief Complaint  ?Patient presents with  ? Follow-up  ?  Might have a concussion, march 5th pt fell and hit temple/cheek on windowsill, did not go to the hospital, had nausea for a few days, dizziness, has been off since, fuzzy headed, unable to clearly express thoughts   ? Depression  ? Gastroesophageal Reflux  ? Hypertension  ? Hyperlipidemia  ? Anxiety  ? Asthma  ? Medication Refill  ? ? ?HPI ?Bonnie West presents for follow-up visit for hypertension, anxiety, depression, and medication refills.  She also had a fall on March 5 in the bathroom where she hit her head on the windowsill on the left side of her face next to the lateral side of her left eye.  She did not go to the ER after the fall.  She reports having symptoms of headache, brain fog, dizziness intermittently, and feeling like she is unable to get all of her words out when she is trying to talk.  Symptoms are currently tolerable but she is worried about long-term problems and if there is any permanent damage. ?She is due for refills of alprazolam and hydrocodone. ? ? ?Current Medication: ?Outpatient Encounter Medications as of 02/04/2022  ?Medication Sig  ? acetaminophen (TYLENOL) 500 MG tablet Take 1,000 mg by mouth 2 (two) times daily as needed for mild pain or moderate pain.  ? albuterol (VENTOLIN HFA) 108 (90 Base) MCG/ACT inhaler INHALE 2 PUFFS BY MOUTH EVERY 6 HOURS  ? baclofen (LIORESAL) 10 MG tablet TAKE 1 TABLET BY MOUTH TWICE A DAY AS NEEDED FOR MUSCLE SPASMS  ? bisacodyl (DULCOLAX) 5 MG EC tablet Take 5-10 mg by mouth at bedtime as needed for moderate constipation. Two at night  ? cetirizine (ZYRTEC) 10 MG tablet Take 10 mg by mouth daily.  ? cholecalciferol (VITAMIN D) 1000 units tablet Take 1,000 Units by mouth at bedtime.  ? clindamycin (CLEOCIN T) 1 %  external solution APPLY 1 APPLICATION TOPICALLY 2 (TWO) TIMES DAILY AS NEEDED. FOR SKIN BREAKDOWN  ? cyclobenzaprine (FLEXERIL) 5 MG tablet Take 5-10 mg by mouth 2 (two) times daily as needed.  ? docusate sodium (COLACE) 100 MG capsule Take 100-200 mg by mouth at bedtime as needed for mild constipation or moderate constipation.   ? estradiol (ESTRACE) 0.1 MG/GM vaginal cream INSERT 1 GRAM VAGINALLY TWICE WEEKLY AT BEDTIME  ? FLUoxetine (PROZAC) 40 MG capsule Take 1 capsule (40 mg total) by mouth daily.  ? fluticasone (FLOVENT HFA) 110 MCG/ACT inhaler USE 2 PUFFS INHALED TWICE DAILY  ? ipratropium (ATROVENT HFA) 17 MCG/ACT inhaler INHALE 2 PUFFS 4 TIMES DAILY  ? ipratropium-albuterol (DUONEB) 0.5-2.5 (3) MG/3ML SOLN Take 3 mLs by nebulization every 4 (four) hours as needed (SOB, wheezing).  ? isometheptene-acetaminophen-dichloralphenazone (MIDRIN) 65-100-325 MG capsule Take 1 capsule by mouth 4 (four) times daily as needed for migraine. Maximum 5 capsules in 12 hours for migraine headaches, 8 capsules in 24 hours for tension headaches.  ? levothyroxine (SYNTHROID) 100 MCG tablet TAKE 1 TABLET BY MOUTH EVERY DAY IN THE MORNING  ? lisinopril (ZESTRIL) 10 MG tablet TAKE 1 TABLET BY MOUTH EVERY DAY  ? magnesium oxide (MAG-OX) 400 MG tablet Take 400 mg by mouth at bedtime.   ? montelukast (SINGULAIR) 10 MG tablet Take 1 tablet (10 mg total) by mouth daily.  ?  omeprazole (PRILOSEC) 40 MG capsule TAKE 1 CAPSULE BY MOUTH EVERY DAY  ? polyethylene glycol powder (GLYCOLAX/MIRALAX) 17 GM/SCOOP powder Take 17 g by mouth every evening.  ? triamcinolone cream (KENALOG) 0.1 % Apply 1 application topically 2 (two) times daily as needed. For skin breakdown  ? vitamin B-12 (CYANOCOBALAMIN) 1000 MCG tablet Take 1,000 mcg by mouth daily.  ? [DISCONTINUED] albuterol (PROVENTIL) (2.5 MG/3ML) 0.083% nebulizer solution Take 3 mLs (2.5 mg total) by nebulization every 6 (six) hours as needed for wheezing or shortness of breath.  ?  [DISCONTINUED] ALPRAZolam (XANAX) 0.25 MG tablet Take 1 tablet (0.25 mg total) by mouth 2 (two) times daily as needed for anxiety.  ? [DISCONTINUED] fenofibrate (TRICOR) 145 MG tablet Take 1 tablet (145 mg total) by mouth daily.  ? [DISCONTINUED] HYDROcodone-Acetaminophen 5-300 MG TABS Take 1 tablet by mouth 2 (two) times daily as needed (back pain). Take one tab po bid for back pain  ? [DISCONTINUED] ipratropium (ATROVENT) 0.02 % nebulizer solution Use 1 vial nebulized QID prn shortness of breath/wheezing.  ? ALPRAZolam (XANAX) 0.25 MG tablet Take 1 tablet (0.25 mg total) by mouth 2 (two) times daily as needed for anxiety.  ? fenofibrate (TRICOR) 145 MG tablet Take 1 tablet (145 mg total) by mouth daily.  ? HYDROcodone-Acetaminophen 5-300 MG TABS Take 1 tablet by mouth 2 (two) times daily as needed (back pain). Take one tab po bid for back pain  ? ?No facility-administered encounter medications on file as of 02/04/2022.  ? ? ?Surgical History: ?Past Surgical History:  ?Procedure Laterality Date  ? BUNIONECTOMY    ? CATARACT EXTRACTION W/PHACO Right 10/11/2016  ? Procedure: CATARACT EXTRACTION PHACO AND INTRAOCULAR LENS PLACEMENT (IOC);  Surgeon: Birder Robson, MD;  Location: [facility] ORS;  Service: Ophthalmology;  Laterality: Right;  Lot# [id number] ?[place]: 00:37.7 ?AP%: 18.1 ?CDE: 6.80  ? CATARACT EXTRACTION W/PHACO Left 11/08/2016  ? Procedure: CATARACT EXTRACTION PHACO AND INTRAOCULAR LENS PLACEMENT (IOC);  Surgeon: Birder Robson, MD;  Location: [facility] ORS;  Service: Ophthalmology;  Laterality: Left;  PACK LOT: [id number] ?US:00:32 ?AP:44 ?CDE:6.46  ? COLONOSCOPY    ? COLONOSCOPY WITH PROPOFOL N/A 11/26/2015  ? Procedure: COLONOSCOPY WITH PROPOFOL;  Surgeon: Manya Silvas, MD;  Location: [facility]COPY;  Service: Endoscopy;  Laterality: N/A;  ? EYE SURGERY  07/23/2021  ? EYE SURGERY Left 09/24/2021  ? strabusmis, double vision  ? TONSILLECTOMY    ? ? ?Medical History: ?Past Medical History:  ?Diagnosis Date  ?  Allergic rhinitis   ? Anxiety   ? Asthma   ? Blood transfusion without reported diagnosis   ? Depression   ? Diverticulosis   ? Dyspnea   ? Esophagitis   ? GERD (gastroesophageal reflux disease)   ? Headache   ? Heart murmur   ? Hyperlipemia   ? Hypertension   ? Hypothyroidism   ? Obesity   ? Osteopenia   ? Palpitations   ? Pneumothorax   ? Sleep apnea   ? ? ?Family History: ?Family History  ?Problem Relation Age of Onset  ? Breast cancer Maternal Grandmother   ? Stroke Maternal Grandmother   ? Bladder Cancer Father   ? Prostate cancer Father   ? Heart attack Father   ? Aortic aneurysm Father   ? Congestive Heart Failure Father   ? Colon cancer Paternal Grandmother   ? Aneurysm Paternal Grandmother   ? Stroke Paternal Grandmother   ? Congestive Heart Failure Paternal Grandmother   ? Dementia Mother   ?  Stroke Mother   ? Dementia Maternal Grandfather   ? Stroke Maternal Grandfather   ? Dementia Paternal Grandfather   ? Stroke Paternal Grandfather   ? ? ?Social History  ? ?Socioeconomic History  ? Marital status: Married  ?  Spouse name: Not on file  ? Number of children: Not on file  ? Years of education: Not on file  ? Highest education level: Not on file  ?Occupational History  ? Not on file  ?Tobacco Use  ? Smoking status: Former  ? Smokeless tobacco: Never  ?Vaping Use  ? Vaping Use: Never used  ?Substance and Sexual Activity  ? Alcohol use: No  ? Drug use: No  ? Sexual activity: Not Currently  ?  Partners: Male  ?  Birth control/protection: Post-menopausal  ?Other Topics Concern  ? Not on file  ?Social History Narrative  ? Not on file  ? ?Social Determinants of Health  ? ?Financial Resource Strain: Low Risk   ? Difficulty of Paying Living Expenses: Not very hard  ?Food Insecurity: Not on file  ?Transportation Needs: Not on file  ?Physical Activity: Not on file  ?Stress: Not on file  ?Social Connections: Not on file  ?Intimate Partner Violence: Not on file  ? ? ? ? ?Review of Systems  ?Constitutional:  Negative  for chills, fatigue and unexpected weight change.  ?HENT:  Negative for congestion, rhinorrhea, sneezing and sore throat.   ?Eyes:  Negative for redness.  ?Respiratory:  Negative for cough, chest tightness and shortness

## 2022-02-05 LAB — CMP14+EGFR
ALT: 11 IU/L (ref 0–32)
AST: 20 IU/L (ref 0–40)
Albumin/Globulin Ratio: 2.2 (ref 1.2–2.2)
Albumin: 4.6 g/dL (ref 3.7–4.7)
Alkaline Phosphatase: 56 IU/L (ref 44–121)
BUN/Creatinine Ratio: 26 (ref 12–28)
BUN: 22 mg/dL (ref 8–27)
Bilirubin Total: 0.2 mg/dL (ref 0.0–1.2)
CO2: 22 mmol/L (ref 20–29)
Calcium: 10.1 mg/dL (ref 8.7–10.3)
Chloride: 104 mmol/L (ref 96–106)
Creatinine, Ser: 0.86 mg/dL (ref 0.57–1.00)
Globulin, Total: 2.1 g/dL (ref 1.5–4.5)
Glucose: 90 mg/dL (ref 70–99)
Potassium: 4.4 mmol/L (ref 3.5–5.2)
Sodium: 142 mmol/L (ref 134–144)
Total Protein: 6.7 g/dL (ref 6.0–8.5)
eGFR: 72 mL/min/{1.73_m2} (ref >59)

## 2022-02-05 LAB — VITAMIN D 25 HYDROXY (VIT D DEFICIENCY, FRACTURES): Vit D, 25-Hydroxy: 64.3 ng/mL (ref 30.0–100.0)

## 2022-02-05 LAB — CBC WITH DIFFERENTIAL/PLATELET
Basophils Absolute: 0.1 10*3/uL (ref 0.0–0.2)
Basos: 1 %
EOS (ABSOLUTE): 0.4 10*3/uL (ref 0.0–0.4)
Eos: 5 %
Hematocrit: 39.5 % (ref 34.0–46.6)
Hemoglobin: 13.1 g/dL (ref 11.1–15.9)
Immature Grans (Abs): 0 10*3/uL (ref 0.0–0.1)
Immature Granulocytes: 0 %
Lymphocytes Absolute: 2.9 10*3/uL (ref 0.7–3.1)
Lymphs: 36 %
MCH: 31.6 pg (ref 26.6–33.0)
MCHC: 33.2 g/dL (ref 31.5–35.7)
MCV: 95 fL (ref 79–97)
Monocytes Absolute: 0.6 10*3/uL (ref 0.1–0.9)
Monocytes: 7 %
Neutrophils Absolute: 4.2 10*3/uL (ref 1.4–7.0)
Neutrophils: 51 %
Platelets: 310 10*3/uL (ref 150–450)
RBC: 4.15 x10E6/uL (ref 3.77–5.28)
RDW: 11.8 % (ref 11.7–15.4)
WBC: 8.2 10*3/uL (ref 3.4–10.8)

## 2022-02-05 LAB — LIPID PANEL
Chol/HDL Ratio: 4.1 ratio (ref 0.0–4.4)
Cholesterol, Total: 184 mg/dL (ref 100–199)
HDL: 45 mg/dL (ref >39)
LDL Chol Calc (NIH): 122 mg/dL — ABNORMAL HIGH (ref 0–99)
Triglycerides: 95 mg/dL (ref 0–149)
VLDL Cholesterol Cal: 17 mg/dL (ref 5–40)

## 2022-02-05 LAB — TSH+FREE T4
Free T4: 1.95 ng/dL — ABNORMAL HIGH (ref 0.82–1.77)
TSH: 0.782 u[IU]/mL (ref 0.450–4.500)

## 2022-02-08 ENCOUNTER — Ambulatory Visit: Payer: Medicare Other | Admitting: Internal Medicine

## 2022-02-09 ENCOUNTER — Ambulatory Visit
Admission: RE | Admit: 2022-02-09 | Discharge: 2022-02-09 | Disposition: A | Discharge status: Home / Self Care | Payer: Medicare Other | Source: Ambulatory Visit | Attending: Nurse Practitioner | Admitting: Nurse Practitioner

## 2022-02-09 ENCOUNTER — Other Ambulatory Visit: Payer: Self-pay

## 2022-02-09 DIAGNOSIS — R519 Headache, unspecified: Secondary | ICD-10-CM | POA: Diagnosis not present

## 2022-02-09 DIAGNOSIS — Z9181 History of falling: Secondary | ICD-10-CM | POA: Insufficient documentation

## 2022-02-09 DIAGNOSIS — S0990XA Unspecified injury of head, initial encounter: Secondary | ICD-10-CM | POA: Diagnosis not present

## 2022-02-10 ENCOUNTER — Ambulatory Visit (INDEPENDENT_AMBULATORY_CARE_PROVIDER_SITE_OTHER): Payer: Medicare Other | Admitting: Internal Medicine

## 2022-02-10 ENCOUNTER — Encounter: Payer: Self-pay | Admitting: Internal Medicine

## 2022-02-10 VITALS — BP 138/75 | HR 70 | Temp 98.3°F | Resp 16 | Ht 61.0 in | Wt 151.2 lb

## 2022-02-10 DIAGNOSIS — J4540 Moderate persistent asthma, uncomplicated: Secondary | ICD-10-CM

## 2022-02-10 DIAGNOSIS — S0990XA Unspecified injury of head, initial encounter: Secondary | ICD-10-CM

## 2022-02-10 NOTE — Progress Notes (Signed)
[facility] [address] [place], [place] [address]  Pulmonary Sleep Medicine   Office Visit Note  Patient Name: Bonnie West DOB: 1951-01-30 MRN [id number]  Date of Service: 02/10/2022  Complaints/HPI: Overall she is doing ok. Patient has only had one asthma attack since her last visit. She has not had any admissions to the hospital. Patient denies having chest pain. Had a CT scan done yesterday results are pending. Patient also had PFT done she has been normal. She is on inhalers and currently is off the steroids  ROS  General: (-) fever, (-) chills, (-) night sweats, (-) weakness Skin: (-) rashes, (-) itching,. Eyes: (-) visual changes, (-) redness, (-) itching. Nose and Sinuses: (-) nasal stuffiness or itchiness, (-) postnasal drip, (-) nosebleeds, (-) sinus trouble. Mouth and Throat: (-) sore throat, (-) hoarseness. Neck: (-) swollen glands, (-) enlarged thyroid, (-) neck pain. Respiratory: - cough, (-) bloody sputum, - shortness of breath, - wheezing. Cardiovascular: - ankle swelling, (-) chest pain. Lymphatic: (-) lymph node enlargement. Neurologic: (-) numbness, (-) tingling. Psychiatric: (-) anxiety, (-) depression   Current Medication: Outpatient Encounter Medications as of 02/10/2022  Medication Sig   acetaminophen (TYLENOL) 500 MG tablet Take 1,000 mg by mouth 2 (two) times daily as needed for mild pain or moderate pain.   albuterol (VENTOLIN HFA) 108 (90 Base) MCG/ACT inhaler INHALE 2 PUFFS BY MOUTH EVERY 6 HOURS   ALPRAZolam (XANAX) 0.25 MG tablet Take 1 tablet (0.25 mg total) by mouth 2 (two) times daily as needed for anxiety.   baclofen (LIORESAL) 10 MG tablet TAKE 1 TABLET BY MOUTH TWICE A DAY AS NEEDED FOR MUSCLE SPASMS   bisacodyl (DULCOLAX) 5 MG EC tablet Take 5-10 mg by mouth at bedtime as needed for moderate constipation. Two at night   cetirizine (ZYRTEC) 10 MG tablet Take 10 mg by mouth daily.   cholecalciferol (VITAMIN D) 1000 units tablet  Take 1,000 Units by mouth at bedtime.   clindamycin (CLEOCIN T) 1 % external solution APPLY 1 APPLICATION TOPICALLY 2 (TWO) TIMES DAILY AS NEEDED. FOR SKIN BREAKDOWN   cyclobenzaprine (FLEXERIL) 5 MG tablet Take 5-10 mg by mouth 2 (two) times daily as needed.   docusate sodium (COLACE) 100 MG capsule Take 100-200 mg by mouth at bedtime as needed for mild constipation or moderate constipation.    estradiol (ESTRACE) 0.1 MG/GM vaginal cream INSERT 1 GRAM VAGINALLY TWICE WEEKLY AT BEDTIME   fenofibrate (TRICOR) 145 MG tablet Take 1 tablet (145 mg total) by mouth daily.   FLUoxetine (PROZAC) 40 MG capsule Take 1 capsule (40 mg total) by mouth daily.   fluticasone (FLOVENT HFA) 110 MCG/ACT inhaler USE 2 PUFFS INHALED TWICE DAILY   HYDROcodone-Acetaminophen 5-300 MG TABS Take 1 tablet by mouth 2 (two) times daily as needed (back pain). Take one tab po bid for back pain   ipratropium (ATROVENT HFA) 17 MCG/ACT inhaler INHALE 2 PUFFS 4 TIMES DAILY   ipratropium-albuterol (DUONEB) 0.5-2.5 (3) MG/3ML SOLN Take 3 mLs by nebulization every 4 (four) hours as needed (SOB, wheezing).   isometheptene-acetaminophen-dichloralphenazone (MIDRIN) 65-100-325 MG capsule Take 1 capsule by mouth 4 (four) times daily as needed for migraine. Maximum 5 capsules in 12 hours for migraine headaches, 8 capsules in 24 hours for tension headaches.   levothyroxine (SYNTHROID) 100 MCG tablet TAKE 1 TABLET BY MOUTH EVERY DAY IN THE MORNING   lisinopril (ZESTRIL) 10 MG tablet TAKE 1 TABLET BY MOUTH EVERY DAY   magnesium oxide (MAG-OX) 400  MG tablet Take 400 mg by mouth at bedtime.    montelukast (SINGULAIR) 10 MG tablet Take 1 tablet (10 mg total) by mouth daily.   omeprazole (PRILOSEC) 40 MG capsule TAKE 1 CAPSULE BY MOUTH EVERY DAY   polyethylene glycol powder (GLYCOLAX/MIRALAX) 17 GM/SCOOP powder Take 17 g by mouth every evening.   triamcinolone cream (KENALOG) 0.1 % Apply 1 application topically 2 (two) times daily as needed. For  skin breakdown   vitamin B-12 (CYANOCOBALAMIN) 1000 MCG tablet Take 1,000 mcg by mouth daily.   No facility-administered encounter medications on file as of 02/10/2022.    Surgical History: Past Surgical History:  Procedure Laterality Date   BUNIONECTOMY     CATARACT EXTRACTION W/PHACO Right 10/11/2016   Procedure: CATARACT EXTRACTION PHACO AND INTRAOCULAR LENS PLACEMENT (IOC);  Surgeon: Galen Manila, MD;  Location: [facility] ORS;  Service: Ophthalmology;  Laterality: Right;  Lot# [id number] H [place]: 00:37.7 AP%: 18.1 CDE: 6.80   CATARACT EXTRACTION W/PHACO Left 11/08/2016   Procedure: CATARACT EXTRACTION PHACO AND INTRAOCULAR LENS PLACEMENT (IOC);  Surgeon: Galen Manila, MD;  Location: [facility] ORS;  Service: Ophthalmology;  Laterality: Left;  PACK LOT: [id number] H US:00:32 AP:44 CDE:6.46   COLONOSCOPY     COLONOSCOPY WITH PROPOFOL N/A 11/26/2015   Procedure: COLONOSCOPY WITH PROPOFOL;  Surgeon: Scot Jun, MD;  [facility]: [facility] ENDOSCOPY;  Service: Endoscopy;  Laterality: N/A;   EYE SURGERY  07/23/2021   EYE SURGERY Left 09/24/2021   strabusmis, double vision   TONSILLECTOMY      Medical History: Past Medical History:  Diagnosis Date   Allergic rhinitis    Anxiety    Asthma    Blood transfusion without reported diagnosis    Depression    Diverticulosis    Dyspnea    Esophagitis    GERD (gastroesophageal reflux disease)    Headache    Heart murmur    Hyperlipemia    Hypertension    Hypothyroidism    Obesity    Osteopenia    Palpitations    Pneumothorax    Sleep apnea     Family History: Family History  Problem Relation Age of Onset   Breast cancer Maternal Grandmother    Stroke Maternal Grandmother    Bladder Cancer Father    Prostate cancer Father    Heart attack Father    Aortic aneurysm Father    Congestive Heart Failure Father    Colon cancer Paternal Grandmother    Aneurysm Paternal Grandmother    Stroke Paternal Grandmother    Congestive Heart Failure  Paternal Grandmother    Dementia Mother    Stroke Mother    Dementia Maternal Grandfather    Stroke Maternal Grandfather    Dementia Paternal Grandfather    Stroke Paternal Grandfather     Social History: Social History   Socioeconomic History   Marital status: Married    Spouse name: Not on file   Number of children: Not on file   Years of education: Not on file   Highest education level: Not on file  Occupational History   Not on file  Tobacco Use   Smoking status: Former   Smokeless tobacco: Never  Vaping Use   Vaping Use: Never used  Substance and Sexual Activity   Alcohol use: No   Drug use: No   Sexual activity: Not Currently    Partners: Male    Birth control/protection: Post-menopausal  Other Topics Concern   Not on file  Social History Narrative   Not on  file   Social Determinants of Health   Financial Resource Strain: Low Risk    Difficulty of Paying Living Expenses: Not very hard  Food Insecurity: Not on file  Transportation Needs: Not on file  Physical Activity: Not on file  Stress: Not on file  Social Connections: Not on file  Intimate Partner Violence: Not on file    Vital Signs: Blood pressure 138/75, pulse 70, temperature 98.3 F (36.8 C), resp. rate 16, height 5\' 1"  (1.549 m), weight 151 lb 3.2 oz (68.6 kg), SpO2 96 %.  Examination: General Appearance: The patient is well-developed, well-nourished, and in no distress. Skin: Gross inspection of skin unremarkable. Head: normocephalic, no gross deformities. Eyes: no gross deformities noted. ENT: ears appear grossly normal no exudates. Neck: Supple. No thyromegaly. No LAD. Respiratory: no rhonchi noted today. Cardiovascular: Normal S1 and S2 without murmur or rub. Extremities: No cyanosis. pulses are equal. Neurologic: Alert and oriented. No involuntary movements.  LABS: Recent Results (from the past 2160 hour(s))  POCT Urine Drug Screen     Status: Abnormal   Collection Time: 02/04/22  10:21 AM  Result Value Ref Range   POC Methamphetamine UR None Detected None Detected   POC Opiate Ur None Detected None Detected   POC Barbiturate UR None Detected None Detected   POC Amphetamine UR None Detected None Detected   POC Oxycodone UR None Detected None Detected   POC Cocaine UR None Detected None Detected   POC Ecstasy UR None Detected None Detected   POC TRICYCLICS UR None Detected None Detected   POC PHENCYCLIDINE UR None Detected None Detected   POC Marijuana UR None Detected None Detected   POC Methadone UR None Detected None Detected   POC BENZODIAZEPINES UR Positive (A) None Detected   URINE TEMPERATURE     POC DRUG SCREEN OXIDANTS URINE     POC SPECIFIC GRAVITY URINE     POC PH URINE     Methylenedioxyamphetamine None Detected None Detected  TSH + free T4     Status: Abnormal   Collection Time: 02/04/22 11:53 AM  Result Value Ref Range   TSH 0.782 0.450 - 4.500 uIU/mL   Free T4 1.95 (H) 0.82 - 1.77 ng/dL  Vitamin D (25 hydroxy)     Status: None   Collection Time: 02/04/22 11:53 AM  Result Value Ref Range   Vit D, 25-Hydroxy 64.3 30.0 - 100.0 ng/mL    Comment: Vitamin D deficiency has been defined by the Institute of Medicine and an Endocrine Society practice guideline as a level of serum 25-OH vitamin D less than 20 ng/mL (1,2). The Endocrine Society went on to further define vitamin D insufficiency as a level between 21 and 29 ng/mL (2). 1. IOM (Institute of Medicine). 2010. Dietary reference    intakes for calcium and D. [place]: The    [employer]. 2. Holick MF, [place], Bischoff-Ferrari HA, et al.    Evaluation, treatment, and prevention of vitamin D    deficiency: an Endocrine Society clinical practice    guideline. JCEM. 2011 Jul; 96(7):1911-30.   CMP14+EGFR     Status: None   Collection Time: 02/04/22 11:53 AM  Result Value Ref Range   Glucose 90 70 - 99 mg/dL   BUN 22 8 - 27 mg/dL   Creatinine, Ser 4.69 0.57 - 1.00 mg/dL    eGFR 72 >62 XB/MWU/1.32   BUN/Creatinine Ratio 26 12 - 28   Sodium 142 134 - 144 mmol/L  Potassium 4.4 3.5 - 5.2 mmol/L   Chloride 104 96 - 106 mmol/L   CO2 22 20 - 29 mmol/L   Calcium 10.1 8.7 - 10.3 mg/dL   Total Protein 6.7 6.0 - 8.5 g/dL   Albumin 4.6 3.7 - 4.7 g/dL   Globulin, Total 2.1 1.5 - 4.5 g/dL   Albumin/Globulin Ratio 2.2 1.2 - 2.2   Bilirubin Total 0.2 0.0 - 1.2 mg/dL   Alkaline Phosphatase 56 44 - 121 IU/L   AST 20 0 - 40 IU/L   ALT 11 0 - 32 IU/L  CBC with Differential/Platelet     Status: None   Collection Time: 02/04/22 11:53 AM  Result Value Ref Range   WBC 8.2 3.4 - 10.8 x10E3/uL   RBC 4.15 3.77 - 5.28 x10E6/uL   Hemoglobin 13.1 11.1 - 15.9 g/dL   Hematocrit 16.1 09.6 - 46.6 %   MCV 95 79 - 97 fL   MCH 31.6 26.6 - 33.0 pg   MCHC 33.2 31.5 - 35.7 g/dL   RDW 04.5 40.9 - 81.1 %   Platelets 310 150 - 450 x10E3/uL   Neutrophils 51 Not Estab. %   Lymphs 36 Not Estab. %   Monocytes 7 Not Estab. %   Eos 5 Not Estab. %   Basos 1 Not Estab. %   Neutrophils Absolute 4.2 1.4 - 7.0 x10E3/uL   Lymphocytes Absolute 2.9 0.7 - 3.1 x10E3/uL   Monocytes Absolute 0.6 0.1 - 0.9 x10E3/uL   EOS (ABSOLUTE) 0.4 0.0 - 0.4 x10E3/uL   Basophils Absolute 0.1 0.0 - 0.2 x10E3/uL   Immature Granulocytes 0 Not Estab. %   Immature Grans (Abs) 0.0 0.0 - 0.1 x10E3/uL  Lipid Profile     Status: Abnormal   Collection Time: 02/04/22 11:53 AM  Result Value Ref Range   Cholesterol, Total 184 100 - 199 mg/dL   Triglycerides 95 0 - 149 mg/dL   HDL 45 >91 mg/dL   VLDL Cholesterol Cal 17 5 - 40 mg/dL   LDL Chol Calc (NIH) 478 (H) 0 - 99 mg/dL   Chol/HDL Ratio 4.1 0.0 - 4.4 ratio    Comment:                                   T. Chol/HDL Ratio                                             Men  Women                               1/2 Avg.Risk  3.4    3.3                                   Avg.Risk  5.0    4.4                                2X Avg.Risk  9.6    7.1  3X Avg.Risk 23.4   11.0     Radiology: No results found.  No results found.  No results found.    Assessment and Plan: Patient Active Problem List   Diagnosis Date Noted   Vaginal atrophy 08/20/2020   Diastolic dysfunction 07/15/2020   OSA on CPAP 07/15/2020   Tightness in chest 05/20/2020   Hypercalcemia 05/20/2020   Primary insomnia 05/20/2020   Acute non-recurrent pansinusitis 02/12/2020   Encounter for general adult medical examination with abnormal findings 11/10/2019   Gastroesophageal reflux disease without esophagitis 11/10/2019   Dysuria 11/10/2019   Acute otitis externa of left ear 07/15/2019   Irritable bowel syndrome with constipation 07/15/2019   Chronic midline low back pain without sciatica 04/27/2019   Allergic rhinitis due to pollen 04/27/2019   Essential hypertension, benign 04/27/2019   GAD (generalized anxiety disorder) 09/01/2018   Chronic migraine without aura without status migrainosus, not intractable 09/01/2018   Encounter for long-term (current) use of medications 09/01/2018   Asthma without status asthmaticus 01/30/2018   Hyperlipidemia, unspecified 01/30/2018   Hypertension 01/30/2018   Obesity, unspecified 01/30/2018   Hypothyroidism 01/30/2018   Fatty infiltration of liver 12/10/2015   Abdominal pain, LLQ (left lower quadrant) 11/03/2015   History of adenomatous polyp of colon 11/03/2015   Hallux valgus, left 09/18/2014   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   Shoulder arthritis 12/19/2013   1. Moderate persistent asthma without status asthmaticus without complication Under control as she has been. Will continue with inhalers as prescribed  2. Minor head trauma CT results awaited will follow with PCP  3. Obesity, morbid (HCC) Obesity Counseling: Had a lengthy discussion regarding patients BMI and weight issues. Patient was instructed on portion control as well as increased activity. Also discussed caloric restrictions with  trying to maintain intake less than 2000 Kcal. Discussions were made in accordance with the 5As of weight management. Simple actions such as not eating late and if able to, taking a walk is suggested.     General Counseling: I have discussed the findings of the evaluation and examination with Bonita Quin.  I have also discussed any further diagnostic evaluation thatmay be needed or ordered today. Samari verbalizes understanding of the findings of todays visit. We also reviewed her medications today and discussed drug interactions and side effects including but not limited excessive drowsiness and altered mental states. We also discussed that there is always a risk not just to her but also people around her. she has been encouraged to call the office with any questions or concerns that should arise related to todays visit.  No orders of the defined types were placed in this encounter.    Time spent: 55  I have personally obtained a history, examined the patient, evaluated laboratory and imaging results, formulated the assessment and plan and placed orders.    Yevonne Pax, MD [facility]

## 2022-02-10 NOTE — Patient Instructions (Signed)
Asthma, Adult ?Asthma is a long-term (chronic) condition in which the airways get tight and narrow. The airways are the breathing passages that lead from the nose and mouth down into the lungs. A person with asthma will have times when symptoms get worse. These are called asthma attacks. They can cause coughing, whistling sounds when you breathe (wheezing), shortness of breath, and chest pain. They can make it hard to breathe. There is no cure for asthma, but medicines and lifestyle changes can help control it. ?There are many things that can bring on an asthma attack or make asthma symptoms worse (triggers). Common triggers include: ?Mold. ?Dust. ?Cigarette smoke. ?Cockroaches. ?Things that can cause allergy symptoms (allergens). These include animal skin flakes (dander) and pollen from trees or grass. ?Things that pollute the air. These may include household cleaners, wood smoke, smog, or chemical odors. ?Cold air, weather changes, and wind. ?Crying or laughing hard. ?Stress. ?Certain medicines or drugs. ?Certain foods such as dried fruit, potato chips, and grape juice. ?Infections, such as a cold or the flu. ?Certain medical conditions or diseases. ?Exercise or tiring activities. ?Asthma may be treated with medicines and by staying away from the things that cause asthma attacks. Types of medicines may include: ?Controller medicines. These help prevent asthma symptoms. They are usually taken every day. ?Fast-acting reliever or rescue medicines. These quickly relieve asthma symptoms. They are used as needed and provide short-term relief. ?Allergy medicines if your attacks are brought on by allergens. ?Medicines to help control the body's defense (immune) system. ?Follow these instructions at home: ?Avoiding triggers in your home ?Change your heating and air conditioning filter often. ?Limit your use of fireplaces and wood stoves. ?Get rid of pests (such as roaches and mice) and their droppings. ?Throw away plants  if you see mold on them. ?Clean your floors. Dust regularly. Use cleaning products that do not smell. ?Have someone vacuum when you are not home. Use a vacuum cleaner with a HEPA filter if possible. ?Replace carpet with wood, tile, or vinyl flooring. Carpet can trap animal skin flakes and dust. ?Use allergy-proof pillows, mattress covers, and box spring covers. ?Wash bed sheets and blankets every week in hot water. Dry them in a dryer. ?Keep your bedroom free of any triggers. ?Avoid pets and keep windows closed when things that cause allergy symptoms are in the air. ?Use blankets that are made of polyester or cotton. ?Clean bathrooms and kitchens with bleach. If possible, have someone repaint the walls in these rooms with mold-resistant paint. Keep out of the rooms that are being cleaned and painted. ?Wash your hands often with soap and water. If soap and water are not available, use hand sanitizer. ?Do not allow anyone to smoke in your home. ?General instructions ?Take over-the-counter and prescription medicines only as told by your doctor. ?Talk with your doctor if you have questions about how or when to take your medicines. ?Make note if you need to use your medicines more often than usual. ?Do not use any products that contain nicotine or tobacco, such as cigarettes and e-cigarettes. If you need help quitting, ask your doctor. ?Stay away from secondhand smoke. ?Avoid doing things outdoors when allergen counts are high and when air quality is low. ?Wear a ski mask when doing outdoor activities in the winter. The mask should cover your nose and mouth. Exercise indoors on cold days if you can. ?Warm up before you exercise. Take time to cool down after exercise. ?Use a peak flow meter as   told by your doctor. A peak flow meter is a tool that measures how well the lungs are working. ?Keep track of the peak flow meter's readings. Write them down. ?Follow your asthma action plan. This is a written plan for taking care  of your asthma and treating your attacks. ?Make sure you get all the shots (vaccines) that your doctor recommends. Ask your doctor about a flu shot and a pneumonia shot. ?Keep all follow-up visits as told by your doctor. This is important. ?Contact a doctor if: ?You have wheezing, shortness of breath, or a cough even while taking medicine to prevent attacks. ?The mucus you cough up (sputum) is thicker than usual. ?The mucus you cough up changes from clear or white to yellow, green, gray, or bloody. ?You have problems from the medicine you are taking, such as: ?A rash. ?Itching. ?Swelling. ?Trouble breathing. ?You need reliever medicines more than 2-3 times a week. ?Your peak flow reading is still at 50-79% of your personal best after following the action plan for 1 hour. ?You have a fever. ?Get help right away if: ?You seem to be worse and are not responding to medicine during an asthma attack. ?You are short of breath even at rest. ?You get short of breath when doing very little activity. ?You have trouble eating, drinking, or talking. ?You have chest pain or tightness. ?You have a fast heartbeat. ?Your lips or fingernails start to turn blue. ?You are light-headed or dizzy, or you faint. ?Your peak flow is less than 50% of your personal best. ?You feel too tired to breathe normally. ?Summary ?Asthma is a long-term (chronic) condition in which the airways get tight and narrow. An asthma attack can make it hard to breathe. ?Asthma cannot be cured, but medicines and lifestyle changes can help control it. ?Make sure you understand how to avoid triggers and how and when to use your medicines. ?This information is not intended to replace advice given to you by your health care provider. Make sure you discuss any questions you have with your health care provider. ?Document Revised: 03/01/2020 Document Reviewed: 03/11/2020 ?Elsevier Patient Education ? 2022 [employer] ? ?

## 2022-02-11 ENCOUNTER — Telehealth: Payer: Self-pay

## 2022-02-11 NOTE — Telephone Encounter (Signed)
-----   Message from [name], NP sent at 02/11/2022  2:59 PM EDT ----- ?Please call patient and let her know her lab results: ?-- Her cholesterol levels have significantly improved.  Her triglyceride level is within normal limits, which is significantly improved from 158 last year.  Her total cholesterol was elevated and is now within normal limits.  Her VLDL is slightly improved and still within normal limits.  Her HDL level remains.  Her LDL is 122 which is slightly improved from a year ago. ?--Her vitamin D level is normal ?--Her metabolic panel is normal ?--Her CBC is normal ?

## 2022-02-11 NOTE — Telephone Encounter (Signed)
-----   Message from [name], NP sent at 02/11/2022  2:50 PM EDT ----- ?Please let patient know that there are no acute abnormalities found on her CT of the head, no fracture or injury noted. ?

## 2022-02-11 NOTE — Progress Notes (Signed)
Please let patient know that there are no acute abnormalities found on her CT of the head, no fracture or injury noted.

## 2022-02-11 NOTE — Telephone Encounter (Signed)
Pt notified for ct of head result ?

## 2022-03-15 DIAGNOSIS — L2389 Allergic contact dermatitis due to other agents: Secondary | ICD-10-CM | POA: Diagnosis not present

## 2022-03-15 DIAGNOSIS — L298 Other pruritus: Secondary | ICD-10-CM | POA: Diagnosis not present

## 2022-03-28 ENCOUNTER — Emergency Department
Admission: EM | Admit: 2022-03-28 | Discharge: 2022-03-29 | Disposition: A | Discharge status: Home / Self Care | Payer: Medicare Other | Attending: Emergency Medicine | Admitting: Emergency Medicine

## 2022-03-28 ENCOUNTER — Emergency Department: Payer: Medicare Other

## 2022-03-28 ENCOUNTER — Other Ambulatory Visit: Payer: Self-pay

## 2022-03-28 ENCOUNTER — Encounter: Payer: Self-pay | Admitting: Emergency Medicine

## 2022-03-28 DIAGNOSIS — W19XXXA Unspecified fall, initial encounter: Secondary | ICD-10-CM

## 2022-03-28 DIAGNOSIS — S199XXA Unspecified injury of neck, initial encounter: Secondary | ICD-10-CM | POA: Diagnosis not present

## 2022-03-28 DIAGNOSIS — R0781 Pleurodynia: Secondary | ICD-10-CM | POA: Diagnosis not present

## 2022-03-28 DIAGNOSIS — I2510 Atherosclerotic heart disease of native coronary artery without angina pectoris: Secondary | ICD-10-CM | POA: Diagnosis not present

## 2022-03-28 DIAGNOSIS — I700 Atherosclerosis of aorta: Secondary | ICD-10-CM | POA: Diagnosis not present

## 2022-03-28 DIAGNOSIS — E039 Hypothyroidism, unspecified: Secondary | ICD-10-CM | POA: Insufficient documentation

## 2022-03-28 DIAGNOSIS — T1490XA Injury, unspecified, initial encounter: Secondary | ICD-10-CM | POA: Diagnosis not present

## 2022-03-28 DIAGNOSIS — Y92009 Unspecified place in unspecified non-institutional (private) residence as the place of occurrence of the external cause: Secondary | ICD-10-CM | POA: Insufficient documentation

## 2022-03-28 DIAGNOSIS — I10 Essential (primary) hypertension: Secondary | ICD-10-CM | POA: Diagnosis not present

## 2022-03-28 DIAGNOSIS — W01198A Fall on same level from slipping, tripping and stumbling with subsequent striking against other object, initial encounter: Secondary | ICD-10-CM | POA: Diagnosis not present

## 2022-03-28 DIAGNOSIS — S0990XA Unspecified injury of head, initial encounter: Secondary | ICD-10-CM | POA: Diagnosis not present

## 2022-03-28 DIAGNOSIS — M47816 Spondylosis without myelopathy or radiculopathy, lumbar region: Secondary | ICD-10-CM | POA: Diagnosis not present

## 2022-03-28 DIAGNOSIS — R0689 Other abnormalities of breathing: Secondary | ICD-10-CM | POA: Diagnosis not present

## 2022-03-28 DIAGNOSIS — M25552 Pain in left hip: Secondary | ICD-10-CM | POA: Insufficient documentation

## 2022-03-28 DIAGNOSIS — Z043 Encounter for examination and observation following other accident: Secondary | ICD-10-CM | POA: Diagnosis not present

## 2022-03-28 DIAGNOSIS — M5030 Other cervical disc degeneration, unspecified cervical region: Secondary | ICD-10-CM | POA: Diagnosis not present

## 2022-03-28 DIAGNOSIS — S4982XA Other specified injuries of left shoulder and upper arm, initial encounter: Secondary | ICD-10-CM | POA: Diagnosis not present

## 2022-03-28 DIAGNOSIS — J45909 Unspecified asthma, uncomplicated: Secondary | ICD-10-CM | POA: Diagnosis not present

## 2022-03-28 DIAGNOSIS — S299XXA Unspecified injury of thorax, initial encounter: Secondary | ICD-10-CM | POA: Diagnosis not present

## 2022-03-28 LAB — CBC WITH DIFFERENTIAL/PLATELET
Abs Immature Granulocytes: 0.07 10*3/uL (ref 0.00–0.07)
Basophils Absolute: 0.1 10*3/uL (ref 0.0–0.1)
Basophils Relative: 1 %
Eosinophils Absolute: 0.3 10*3/uL (ref 0.0–0.5)
Eosinophils Relative: 2 %
HCT: 41.1 % (ref 36.0–46.0)
Hemoglobin: 13.9 g/dL (ref 12.0–15.0)
Immature Granulocytes: 1 %
Lymphocytes Relative: 23 %
Lymphs Abs: 3.1 10*3/uL (ref 0.7–4.0)
MCH: 31 pg (ref 26.0–34.0)
MCHC: 33.8 g/dL (ref 30.0–36.0)
MCV: 91.7 fL (ref 80.0–100.0)
Monocytes Absolute: 0.8 10*3/uL (ref 0.1–1.0)
Monocytes Relative: 6 %
Neutro Abs: 9.4 10*3/uL — ABNORMAL HIGH (ref 1.7–7.7)
Neutrophils Relative %: 67 %
Platelets: 336 10*3/uL (ref 150–400)
RBC: 4.48 MIL/uL (ref 3.87–5.11)
RDW: 11.8 % (ref 11.5–15.5)
WBC: 13.6 10*3/uL — ABNORMAL HIGH (ref 4.0–10.5)
nRBC: 0 % (ref 0.0–0.2)

## 2022-03-28 LAB — COMPREHENSIVE METABOLIC PANEL
ALT: 19 U/L (ref 0–44)
AST: 28 U/L (ref 15–41)
Albumin: 4.5 g/dL (ref 3.5–5.0)
Alkaline Phosphatase: 40 U/L (ref 38–126)
Anion gap: 7 (ref 5–15)
BUN: 23 mg/dL (ref 8–23)
CO2: 23 mmol/L (ref 22–32)
Calcium: 10.4 mg/dL — ABNORMAL HIGH (ref 8.9–10.3)
Chloride: 107 mmol/L (ref 98–111)
Creatinine, Ser: 1.09 mg/dL — ABNORMAL HIGH (ref 0.44–1.00)
GFR, Estimated: 54 mL/min — ABNORMAL LOW (ref >60)
Glucose, Bld: 100 mg/dL — ABNORMAL HIGH (ref 70–99)
Potassium: 3.5 mmol/L (ref 3.5–5.1)
Sodium: 137 mmol/L (ref 135–145)
Total Bilirubin: 0.8 mg/dL (ref 0.3–1.2)
Total Protein: 7.4 g/dL (ref 6.5–8.1)

## 2022-03-28 MED ORDER — IOHEXOL 300 MG/ML  SOLN
100.0000 mL | Freq: Once | INTRAMUSCULAR | Status: AC | PRN
  Administered 2022-03-28: 100 mL via INTRAVENOUS

## 2022-03-28 NOTE — ED Notes (Signed)
Type and screen hemolyzed per lab department.  ?

## 2022-03-28 NOTE — ED Triage Notes (Signed)
Pt arrived via [facility] from home where pt sts she was in hurry and fell off 3rd step of deck, hitting head. Pt denies LOC. Pt c/o left hip, left shoulder and left rib pain. Shortening noted to the left leg. Pt received 26mg Fentanyl in route. Pt a&O x4 on arrival.  ?

## 2022-03-29 DIAGNOSIS — S0990XA Unspecified injury of head, initial encounter: Secondary | ICD-10-CM | POA: Diagnosis not present

## 2022-03-29 DIAGNOSIS — M47816 Spondylosis without myelopathy or radiculopathy, lumbar region: Secondary | ICD-10-CM | POA: Diagnosis not present

## 2022-03-29 DIAGNOSIS — S299XXA Unspecified injury of thorax, initial encounter: Secondary | ICD-10-CM | POA: Diagnosis not present

## 2022-03-29 DIAGNOSIS — S199XXA Unspecified injury of neck, initial encounter: Secondary | ICD-10-CM | POA: Diagnosis not present

## 2022-03-29 DIAGNOSIS — I700 Atherosclerosis of aorta: Secondary | ICD-10-CM | POA: Diagnosis not present

## 2022-03-29 DIAGNOSIS — M5030 Other cervical disc degeneration, unspecified cervical region: Secondary | ICD-10-CM | POA: Diagnosis not present

## 2022-03-29 DIAGNOSIS — Z043 Encounter for examination and observation following other accident: Secondary | ICD-10-CM | POA: Diagnosis not present

## 2022-03-29 DIAGNOSIS — I2510 Atherosclerotic heart disease of native coronary artery without angina pectoris: Secondary | ICD-10-CM | POA: Diagnosis not present

## 2022-03-29 MED ORDER — ACETAMINOPHEN 325 MG PO TABS
650.0000 mg | ORAL_TABLET | Freq: Once | ORAL | Status: AC
  Administered 2022-03-29: 650 mg via ORAL
  Filled 2022-03-29: qty 2

## 2022-03-29 MED ORDER — OXYCODONE-ACETAMINOPHEN 5-325 MG PO TABS
1.0000 | ORAL_TABLET | Freq: Once | ORAL | Status: DC
Start: 2022-03-29 — End: 2022-03-29
  Filled 2022-03-29: qty 1

## 2022-03-29 MED ORDER — HYDROCODONE-ACETAMINOPHEN 5-325 MG PO TABS
1.0000 | ORAL_TABLET | Freq: Once | ORAL | Status: AC
  Administered 2022-03-29: 1 via ORAL
  Filled 2022-03-29: qty 1

## 2022-03-29 MED ORDER — HYDROCODONE-ACETAMINOPHEN 5-325 MG PO TABS: 1.0000 | ORAL_TABLET | Freq: Four times a day (QID) | ORAL | 0 refills | Status: DC | PRN

## 2022-03-29 NOTE — ED Notes (Signed)
Pt assisted with putting pants on and ambulating from subwait to the bathroom.  Pt moved slowly, but was able to use the bathroom with minimal assistance.  Placed in recliner.   ?

## 2022-03-29 NOTE — Discharge Instructions (Signed)

## 2022-03-29 NOTE — ED Provider Notes (Signed)
[facility] Provider Note    Event Date/Time   First MD Initiated Contact with Patient 03/29/22 [phone]     (approximate)   History   Fall   HPI  Bonnie West is a 71 y.o. female with a history of asthma, diverticulosis, esophagitis, GERD, hypertension, hyperlipidemia, hypothyroidism, osteopenia who presents after mechanical fall.  Patient reports that she was trying to get out of the house in a hurry earlier today when she lost her balance and fell off a third step of her deck.  She hit her head.  No LOC.  She does not take any blood thinners.  She is complaining of left-sided rib cage and hip pain.  She denies headache, chest pain, abdominal pain, shortness of breath.  She reports that her fall was mechanical in nature     Past Medical History:  Diagnosis Date   Allergic rhinitis    Anxiety    Asthma    Blood transfusion without reported diagnosis    Depression    Diverticulosis    Dyspnea    Esophagitis    GERD (gastroesophageal reflux disease)    Headache    Heart murmur    Hyperlipemia    Hypertension    Hypothyroidism    Obesity    Osteopenia    Palpitations    Pneumothorax    Sleep apnea     Past Surgical History:  Procedure Laterality Date   BUNIONECTOMY     CATARACT EXTRACTION W/PHACO Right 10/11/2016   Procedure: CATARACT EXTRACTION PHACO AND INTRAOCULAR LENS PLACEMENT (IOC);  Surgeon: Galen Manila, MD;  Location: [facility] ORS;  Service: Ophthalmology;  Laterality: Right;  Lot# [id number] H [place]: 00:37.7 AP%: 18.1 CDE: 6.80   CATARACT EXTRACTION W/PHACO Left 11/08/2016   Procedure: CATARACT EXTRACTION PHACO AND INTRAOCULAR LENS PLACEMENT (IOC);  Surgeon: Galen Manila, MD;  Location: [facility] ORS;  Service: Ophthalmology;  Laterality: Left;  PACK LOT: [id number] H US:00:32 AP:44 CDE:6.46   COLONOSCOPY     COLONOSCOPY WITH PROPOFOL N/A 11/26/2015   Procedure: COLONOSCOPY WITH PROPOFOL;  Surgeon: Scot Jun, MD;  Location: [facility]  ENDOSCOPY;  Service: Endoscopy;  Laterality: N/A;   EYE SURGERY  07/23/2021   EYE SURGERY Left 09/24/2021   strabusmis, double vision   TONSILLECTOMY       Physical Exam   Triage Vital Signs: ED Triage Vitals  Enc Vitals Group     BP 03/28/22 2210 (!) 173/84     Pulse Rate 03/28/22 2210 73     Resp 03/28/22 2210 19     Temp 03/28/22 2210 97.7 F (36.5 C)     Temp Source 03/28/22 2210 Oral     SpO2 03/28/22 2210 100 %     Weight 03/28/22 2211 148 lb (67.1 kg)     Height 03/28/22 2211 5\' 1"  (1.549 m)     Head Circumference --      Peak Flow --      Pain Score 03/28/22 2211 5     Pain Loc --      Pain Edu? --      Excl. in GC? --     Most recent vital signs: Vitals:   03/29/22 0320 03/29/22 0443  BP: (!) 146/76 131/65  Pulse: 79 75  Resp: 18 18  Temp: 98.4 F (36.9 C) 98.2 F (36.8 C)  SpO2: 97% 99%    Full spinal precautions maintained throughout the trauma exam. Constitutional: Alert and oriented. No acute distress. Does not appear intoxicated.  HEENT Head: Normocephalic and atraumatic. Face: No facial bony tenderness. Stable midface Ears: No hemotympanum bilaterally. No Battle sign Eyes: No eye injury. PERRL. No raccoon eyes Nose: Nontender. No epistaxis. No rhinorrhea Mouth/Throat: Mucous membranes are moist. No oropharyngeal blood. No dental injury. Airway patent without stridor. Normal voice. Neck: no C-collar. No midline c-spine tenderness.  Cardiovascular: Normal rate, regular rhythm. Normal and symmetric distal pulses are present in all extremities. Pulmonary/Chest: Chest wall is stable, tender to palpation on the left lateral chest wall with no bruising or deformity. Normal respiratory effort. Breath sounds are normal. No crepitus.  Abdominal: Soft, nontender, non distended. Musculoskeletal: Nontender with normal full range of motion in all extremities. No deformities. No thoracic or lumbar midline spinal tenderness. Pelvis is stable.  Tenderness palpation  of the left hip but has full passive range of motion Skin: Skin is warm, dry and intact. No abrasions or contutions. Psychiatric: Speech and behavior are appropriate. Neurological: Normal speech and language. Moves all extremities to command. No gross focal neurologic deficits are appreciated.  Glascow Coma Score: 4 - Opens eyes on own 6 - Follows simple motor commands 5 - Alert and oriented GCS: 15   ED Results / Procedures / Treatments   Labs (all labs ordered are listed, but only abnormal results are displayed) Labs Reviewed  CBC WITH DIFFERENTIAL/PLATELET - Abnormal; Notable for the following components:      Result Value   WBC 13.6 (*)    Neutro Abs 9.4 (*)    All other components within normal limits  COMPREHENSIVE METABOLIC PANEL - Abnormal; Notable for the following components:   Glucose, Bld 100 (*)    Creatinine, Ser 1.09 (*)    Calcium 10.4 (*)    GFR, Estimated 54 (*)    All other components within normal limits  TYPE AND SCREEN     EKG  none   RADIOLOGY I, Nita Sickle, attending MD, have personally viewed and interpreted the images obtained during this visit as below:  CT head, C-spine, chest, abdomen and pelvis with no signs of traumatic injury   ___________________________________________________ Interpretation by Radiologist:  CT Head Wo Contrast  Result Date: 03/29/2022 CLINICAL DATA:  Fall EXAM: CT HEAD WITHOUT CONTRAST TECHNIQUE: Contiguous axial images were obtained from the base of the skull through the vertex without intravenous contrast. RADIATION DOSE REDUCTION: This exam was performed according to the departmental dose-optimization program which includes automated exposure control, adjustment of the mA and/or kV according to patient size and/or use of iterative reconstruction technique. COMPARISON:  02/09/2022 FINDINGS: Brain: No acute intracranial abnormality. Specifically, no hemorrhage, hydrocephalus, mass lesion, acute infarction, or  significant intracranial injury. Vascular: No hyperdense vessel or unexpected calcification. Skull: No acute calvarial abnormality. Sinuses/Orbits: No acute findings Other: None IMPRESSION: No acute intracranial abnormality. Electronically Signed   By: Charlett Nose M.D.   On: 03/29/2022 00:15   CT Cervical Spine Wo Contrast  Result Date: 03/29/2022 CLINICAL DATA:  Neck trauma, midline tenderness (Age 60-64y).  Fall. EXAM: CT CERVICAL SPINE WITHOUT CONTRAST TECHNIQUE: Multidetector CT imaging of the cervical spine was performed without intravenous contrast. Multiplanar CT image reconstructions were also generated. RADIATION DOSE REDUCTION: This exam was performed according to the departmental dose-optimization program which includes automated exposure control, adjustment of the mA and/or kV according to patient size and/or use of iterative reconstruction technique. COMPARISON:  None Available. FINDINGS: Alignment: No subluxation. Skull base and vertebrae: No acute fracture. No primary bone lesion or focal pathologic process. Soft tissues and  spinal canal: No prevertebral fluid or swelling. No visible canal hematoma. Disc levels: Diffuse moderate to advanced degenerative disc and facet disease. Upper chest: No acute findings Other: None IMPRESSION: Degenerative disc and facet disease.  No acute bony abnormality. Electronically Signed   By: Charlett Nose M.D.   On: 03/29/2022 00:16   CT CHEST ABDOMEN PELVIS W CONTRAST  Result Date: 03/29/2022 CLINICAL DATA:  Polytrauma, blunt.  Fall. EXAM: CT CHEST, ABDOMEN, AND PELVIS WITH CONTRAST TECHNIQUE: Multidetector CT imaging of the chest, abdomen and pelvis was performed following the standard protocol during bolus administration of intravenous contrast. RADIATION DOSE REDUCTION: This exam was performed according to the departmental dose-optimization program which includes automated exposure control, adjustment of the mA and/or kV according to patient size and/or use of  iterative reconstruction technique. CONTRAST:  OMNIPAQUE IOHEXOL 300 MG/ML  SOLN COMPARISON:  11/10/2015 FINDINGS: CT CHEST FINDINGS Cardiovascular: Heart is normal size. Aorta is normal caliber. Aortic atherosclerosis. Mediastinum/Nodes: No mediastinal, hilar, or axillary adenopathy. Trachea and esophagus are unremarkable. Lungs/Pleura: Lungs are clear. No focal airspace opacities or suspicious nodules. No effusions. No pneumothorax Musculoskeletal: Chest wall soft tissues are unremarkable. No acute bony abnormality. CT ABDOMEN PELVIS FINDINGS Hepatobiliary: No hepatic injury or perihepatic hematoma. Gallbladder is unremarkable. Pancreas: No focal abnormality or ductal dilatation. Spleen: No splenic injury or perisplenic hematoma. Adrenals/Urinary Tract: No adrenal hemorrhage or renal injury identified. Bladder is unremarkable. Stomach/Bowel: Stomach, large and small bowel grossly unremarkable. Vascular/Lymphatic: Aortic atherosclerosis. No evidence of aneurysm or adenopathy. Reproductive: Uterus and adnexa unremarkable.  No mass. Other: No free fluid or free air. Musculoskeletal: No acute bony abnormality. Degenerative changes in the lumbar spine. IMPRESSION: No acute findings or significant traumatic injury in the chest, abdomen or pelvis. Coronary artery disease, aortic atherosclerosis. Electronically Signed   By: Charlett Nose M.D.   On: 03/29/2022 00:14       PROCEDURES:  Critical Care performed: No  Procedures    IMPRESSION / MDM / ASSESSMENT AND PLAN / ED COURSE  I reviewed the triage vital signs and the nursing notes.  72 y.o. female with a history of asthma, diverticulosis, esophagitis, GERD, hypertension, hyperlipidemia, hypothyroidism, osteopenia who presents after mechanical fall.  Patient complaining of left-sided chest wall pain and left hip pain.  Patient's presentation consistent with a mechanical fall.  CT head, C-spine, chest, abdomen and pelvis with no signs of traumatic  injury.  Patient was given Tylenol and hydrocodone for pain.  Labs with no significant abnormalities.  I did discuss admission with the patient based on her age and the fact the patient was extremely sore from the fall but she really wants to go home because she is afraid her insurance will not cover her stay in the hospital since she will be under observation for pain control and PT.  I am providing her with a prescription for Norco.  I also gave her walker for home.  Patient is going to be discharged to the care of her husband.  Recommended close follow-up with PCP.  Discussed my standard return precaution if patient's pain is not well controlled or if she has any further episodes of fall.  Patient ambulated with a walker without assistance and steady for discharge.  Husband was comfortable with this plan.   MEDICATIONS GIVEN IN ED: Medications  iohexol (OMNIPAQUE) 300 MG/ML solution 100 mL (100 mLs Intravenous Contrast Given 03/28/22 2326)  acetaminophen (TYLENOL) tablet 650 mg (650 mg Oral Given 03/29/22 0212)  HYDROcodone-acetaminophen (NORCO/VICODIN) 5-325 MG  per tablet 1 tablet (1 tablet Oral Given 03/29/22 0212)  HYDROcodone-acetaminophen (NORCO/VICODIN) 5-325 MG per tablet 1 tablet (1 tablet Oral Given 03/29/22 0433)    EMR reviewed including last visit with her pulmonologist from March 2023 for her asthma    FINAL CLINICAL IMPRESSION(S) / ED DIAGNOSES   Final diagnoses:  Fall, initial encounter     Rx / DC Orders   ED Discharge Orders          Ordered    HYDROcodone-acetaminophen (NORCO/VICODIN) 5-325 MG tablet  Every 6 hours PRN        03/29/22 0410             Note:  This document was prepared using Dragon voice recognition software and may include unintentional dictation errors.   Please note:  Patient was evaluated in Emergency Department today for the symptoms described in the history of present illness. Patient was evaluated in the context of the global COVID-19  pandemic, which necessitated consideration that the patient might be at risk for infection with the SARS-CoV-2 virus that causes COVID-19. Institutional protocols and algorithms that pertain to the evaluation of patients at risk for COVID-19 are in a state of rapid change based on information released by regulatory bodies including the CDC and federal and state organizations. These policies and algorithms were followed during the patient's care in the ED.  Some ED evaluations and interventions may be delayed as a result of limited staffing during the pandemic.       Don Perking, [place], MD 03/29/22 [phone]

## 2022-04-01 ENCOUNTER — Telehealth: Payer: Self-pay

## 2022-04-01 NOTE — Telephone Encounter (Signed)
Pt called and advised she went to hospital on 03/28/22 due to a fall down her front steps outside.  She is still having a lot of pain in her rib area and stated that she feels like something is loose or moving inside or separated.  I advised pt that I would go back to the hospital to have them do an xray or further testing to make sure there is no broken bones.  They only did several CT scans on pt in the ER. ?Pt agreed that she will go to ER or to the urgent care in [place] if she feels it gets worse over the weekend.  Pt made appt for Monday  ?

## 2022-04-02 ENCOUNTER — Ambulatory Visit
Admission: EM | Admit: 2022-04-02 | Discharge: 2022-04-02 | Disposition: A | Discharge status: Home / Self Care | Payer: Medicare Other | Attending: Physician Assistant | Admitting: Physician Assistant

## 2022-04-02 ENCOUNTER — Other Ambulatory Visit: Payer: Self-pay

## 2022-04-02 ENCOUNTER — Encounter: Payer: Self-pay | Admitting: Emergency Medicine

## 2022-04-02 ENCOUNTER — Ambulatory Visit (INDEPENDENT_AMBULATORY_CARE_PROVIDER_SITE_OTHER): Payer: Medicare Other

## 2022-04-02 DIAGNOSIS — W19XXXA Unspecified fall, initial encounter: Secondary | ICD-10-CM

## 2022-04-02 DIAGNOSIS — R0781 Pleurodynia: Secondary | ICD-10-CM

## 2022-04-02 DIAGNOSIS — M25552 Pain in left hip: Secondary | ICD-10-CM

## 2022-04-02 DIAGNOSIS — S20212A Contusion of left front wall of thorax, initial encounter: Secondary | ICD-10-CM

## 2022-04-02 MED ORDER — HYDROCODONE-ACETAMINOPHEN 5-325 MG PO TABS: 1.0000 | ORAL_TABLET | Freq: Three times a day (TID) | ORAL | 0 refills | Status: AC | PRN

## 2022-04-02 NOTE — Discharge Instructions (Signed)
-  X-ray looks good today.  Nothing is broken.  You may have bruised and strained/sprained ribs.  This can be incredibly painful. ?- Ice the area ?- I have sent more pain medicine for you ?- Keep your appointment with your PCP in a couple of days. ?- Go to ER if your pain significantly worsens or you feel short of breath. ?

## 2022-04-02 NOTE — ED Triage Notes (Signed)
Patient states that she fell on Monday and was seen in the [facility] ED for her injuries.  Patient was unable to follow-up with her PCP in 3 days.  Patient reports ongoing pain on the left side of her ribs and in her groins.  Patient states that x-rays did not have xrays done but did have a CT scan.   ?

## 2022-04-02 NOTE — ED Provider Notes (Signed)
?[facility] ? ? ? ?CSN: [id number] ?Arrival date & time: 04/02/22  1104 ? ? ?  ? ?History   ?Chief Complaint ?Chief Complaint  ?Patient presents with  ? Fall  ? Rib Pain  ? ? ?HPI ?Bonnie West is a 71 y.o. female presenting for 5-day history of left-sided rib pain and left hip/groin pain.  Patient reports an accidental fall in 03/28/2022.  States she was rushing after her husband and fell off some steps onto her left side and hit her head.  She says she laid there for about 30 minutes because he was talking to the neighbor.  States when he came back to find her, EMS was called and she was taken to the emergency department.  Reports having multiple CT scans performed including of her head and neck as well as abdomen and pelvis at the ER.  Patient did not have any significant findings and was treated for pain.  Has been taking Norco and using a walker to get around.  No worsening of symptoms but patient wanted to follow-up because she cannot see her PCP for another 5 days or so.  She denies any shortness of breath but does have some increased pain in the left ribs when she takes a deep breath.  Also reports pain in her left groin when she lifts her leg.  States sometimes her husband has to help her and lift her leg for her.  Not reporting any headaches, dizziness, syncope, neck pain.  Minimal low back pain.  No numbness, weakness or tingling.  No recent falls since the 1 that sent her to the ED.  No other complaints. ? ?Medical history significant for asthma, anxiety, depression, GERD, hypertension, hyperlipidemia, hypothyroidism, sleep apnea. ? ?HPI ? ?Past Medical History:  ?Diagnosis Date  ? Allergic rhinitis   ? Anxiety   ? Asthma   ? Blood transfusion without reported diagnosis   ? Depression   ? Diverticulosis   ? Dyspnea   ? Esophagitis   ? GERD (gastroesophageal reflux disease)   ? Headache   ? Heart murmur   ? Hyperlipemia   ? Hypertension   ? Hypothyroidism   ? Obesity   ? Osteopenia   ?  Palpitations   ? Pneumothorax   ? Sleep apnea   ? ? ?Patient Active Problem List  ? Diagnosis Date Noted  ? Vaginal atrophy 08/20/2020  ? Diastolic dysfunction 09/40/7680  ? OSA on CPAP 07/15/2020  ? Tightness in chest 05/20/2020  ? Hypercalcemia 05/20/2020  ? Primary insomnia 05/20/2020  ? Acute non-recurrent pansinusitis 02/12/2020  ? Encounter for general adult medical examination with abnormal findings 11/10/2019  ? Gastroesophageal reflux disease without esophagitis 11/10/2019  ? Dysuria 11/10/2019  ? Acute otitis externa of left ear 07/15/2019  ? Irritable bowel syndrome with constipation 07/15/2019  ? Chronic midline low back pain without sciatica 04/27/2019  ? Allergic rhinitis due to pollen 04/27/2019  ? Essential hypertension, benign 04/27/2019  ? GAD (generalized anxiety disorder) 09/01/2018  ? Chronic migraine without aura without status migrainosus, not intractable 09/01/2018  ? Encounter for long-term (current) use of medications 09/01/2018  ? Asthma without status asthmaticus 01/30/2018  ? Hyperlipidemia, unspecified 01/30/2018  ? Hypertension 01/30/2018  ? Obesity, unspecified 01/30/2018  ? Hypothyroidism 01/30/2018  ? Fatty infiltration of liver 12/10/2015  ? Abdominal pain, LLQ (left lower quadrant) 11/03/2015  ? History of adenomatous polyp of colon 11/03/2015  ? Hallux valgus, left 09/18/2014  ? Osteoarthritis of left midfoot  09/18/2014  ? Shoulder arthritis 12/19/2013  ? ? ?Past Surgical History:  ?Procedure Laterality Date  ? BUNIONECTOMY    ? CATARACT EXTRACTION W/PHACO Right 10/11/2016  ? Procedure: CATARACT EXTRACTION PHACO AND INTRAOCULAR LENS PLACEMENT (IOC);  Surgeon: Birder Robson, MD;  Location: [facility] ORS;  Service: Ophthalmology;  Laterality: Right;  Lot# [id number] ?[place]: 00:37.7 ?AP%: 18.1 ?CDE: 6.80  ? CATARACT EXTRACTION W/PHACO Left 11/08/2016  ? Procedure: CATARACT EXTRACTION PHACO AND INTRAOCULAR LENS PLACEMENT (IOC);  Surgeon: Birder Robson, MD;  Location: [facility] ORS;  Service:  Ophthalmology;  Laterality: Left;  PACK LOT: [id number] ?US:00:32 ?AP:44 ?CDE:6.46  ? COLONOSCOPY    ? COLONOSCOPY WITH PROPOFOL N/A 11/26/2015  ? Procedure: COLONOSCOPY WITH PROPOFOL;  Surgeon: Manya Silvas, MD;  Location: [facility] ENDOSCOPY;  Service: Endoscopy;  Laterality: N/A;  ? EYE SURGERY  07/23/2021  ? EYE SURGERY Left 09/24/2021  ? strabusmis, double vision  ? TONSILLECTOMY    ? ? ?OB History   ? ? Gravida  ?1  ? Para  ?1  ? Term  ?1  ? Preterm  ?   ? AB  ?   ? Living  ?1  ?  ? ? SAB  ?   ? IAB  ?   ? Ectopic  ?   ? Multiple  ?   ? Live Births  ?1  ?   ?  ?  ? ? ? ?Home Medications   ? ?Prior to Admission medications   ?Medication Sig Start Date End Date Taking? Authorizing Provider  ?HYDROcodone-acetaminophen (NORCO/VICODIN) 5-325 MG tablet Take 1 tablet by mouth 3 (three) times daily as needed for up to 5 days. 04/02/22 04/07/22 Yes Laurene Footman B, PA-C  ?acetaminophen (TYLENOL) 500 MG tablet Take 1,000 mg by mouth 2 (two) times daily as needed for mild pain or moderate pain.    [provider]  ?albuterol (VENTOLIN HFA) 108 (90 Base) MCG/ACT inhaler INHALE 2 PUFFS BY MOUTH EVERY 6 HOURS 04/20/21   [name], NP  ?ALPRAZolam (XANAX) 0.25 MG tablet Take 1 tablet (0.25 mg total) by mouth 2 (two) times daily as needed for anxiety. 02/04/22   [name], NP  ?baclofen (LIORESAL) 10 MG tablet TAKE 1 TABLET BY MOUTH TWICE A DAY AS NEEDED FOR MUSCLE SPASMS 12/07/21   [name], NP  ?bisacodyl (DULCOLAX) 5 MG EC tablet Take 5-10 mg by mouth at bedtime as needed for moderate constipation. Two at night    [provider]  ?cetirizine (ZYRTEC) 10 MG tablet Take 10 mg by mouth daily.    [provider]  ?cholecalciferol (VITAMIN D) 1000 units tablet Take 1,000 Units by mouth at bedtime.    [provider]  ?clindamycin (CLEOCIN T) 1 % external solution APPLY 1 APPLICATION TOPICALLY 2 (TWO) TIMES DAILY AS NEEDED. FOR SKIN BREAKDOWN 12/15/21   [name], NP   ?cyclobenzaprine (FLEXERIL) 5 MG tablet Take 5-10 mg by mouth 2 (two) times daily as needed. 05/28/21   [provider]  ?docusate sodium (COLACE) 100 MG capsule Take 100-200 mg by mouth at bedtime as needed for mild constipation or moderate constipation.     [provider]  ?estradiol (ESTRACE) 0.1 MG/GM vaginal cream INSERT 1 GRAM VAGINALLY TWICE WEEKLY AT BEDTIME 08/20/20   Ronnell Freshwater, NP  ?fenofibrate (TRICOR) 145 MG tablet Take 1 tablet (145 mg total) by mouth daily. 02/04/22   [name], NP  ?FLUoxetine (PROZAC) 40 MG capsule Take 1 capsule (40 mg total) by mouth  daily. 11/04/21   [name], NP  ?fluticasone (FLOVENT HFA) 110 MCG/ACT inhaler USE 2 PUFFS INHALED TWICE DAILY 09/21/21   Lavera Guise, MD  ?ipratropium (ATROVENT HFA) 17 MCG/ACT inhaler INHALE 2 PUFFS 4 TIMES DAILY 11/04/21   [name], NP  ?ipratropium-albuterol (DUONEB) 0.5-2.5 (3) MG/3ML SOLN Take 3 mLs by nebulization every 4 (four) hours as needed (SOB, wheezing). 02/04/22   [name], NP  ?isometheptene-acetaminophen-dichloralphenazone (MIDRIN) 65-100-325 MG capsule Take 1 capsule by mouth 4 (four) times daily as needed for migraine. Maximum 5 capsules in 12 hours for migraine headaches, 8 capsules in 24 hours for tension headaches.    [provider]  ?levothyroxine (SYNTHROID) 100 MCG tablet TAKE 1 TABLET BY MOUTH EVERY DAY IN THE MORNING 01/28/22   Lavera Guise, MD  ?lisinopril (ZESTRIL) 10 MG tablet TAKE 1 TABLET BY MOUTH EVERY DAY 09/21/21   Lavera Guise, MD  ?magnesium oxide (MAG-OX) 400 MG tablet Take 400 mg by mouth at bedtime.     [provider]  ?montelukast (SINGULAIR) 10 MG tablet Take 1 tablet (10 mg total) by mouth daily. 09/21/21   [name], NP  ?omeprazole (PRILOSEC) 40 MG capsule TAKE 1 CAPSULE BY MOUTH EVERY DAY 09/21/21   [name], NP  ?polyethylene glycol powder (GLYCOLAX/MIRALAX) 17 GM/SCOOP powder Take 17 g by mouth every evening.  07/15/19   Ronnell Freshwater, NP  ?triamcinolone cream (KENALOG) 0.1 % Apply 1 application topically 2 (two) times daily as needed. For skin breakdown    [provider]  ?vitamin B-12 (CYANOCOBALAMIN) 100

## 2022-04-04 ENCOUNTER — Encounter: Payer: Self-pay | Admitting: Nurse Practitioner

## 2022-04-04 ENCOUNTER — Telehealth (INDEPENDENT_AMBULATORY_CARE_PROVIDER_SITE_OTHER): Payer: Medicare Other | Admitting: Nurse Practitioner

## 2022-04-04 ENCOUNTER — Telehealth: Payer: Self-pay

## 2022-04-04 VITALS — BP 107/82 | HR 69 | Resp 16 | Ht 61.0 in | Wt 154.0 lb

## 2022-04-04 DIAGNOSIS — S73102D Unspecified sprain of left hip, subsequent encounter: Secondary | ICD-10-CM

## 2022-04-04 DIAGNOSIS — Z9181 History of falling: Secondary | ICD-10-CM | POA: Diagnosis not present

## 2022-04-04 DIAGNOSIS — S20212D Contusion of left front wall of thorax, subsequent encounter: Secondary | ICD-10-CM | POA: Diagnosis not present

## 2022-04-04 DIAGNOSIS — S0990XA Unspecified injury of head, initial encounter: Secondary | ICD-10-CM

## 2022-04-04 DIAGNOSIS — M940 Chondrocostal junction syndrome [Tietze]: Secondary | ICD-10-CM

## 2022-04-04 NOTE — Telephone Encounter (Signed)
Awaiting 04/04/22 office notes for PT referral-[name] ?

## 2022-04-04 NOTE — Progress Notes (Signed)
[facility] [place], [place] [address]  Internal MEDICINE  Telephone Visit  Patient Name: Bonnie West  [id number]  [id number]  Date of Service: 04/04/2022  I connected with the patient at 10:10 AM by telephone and verified the patients identity using two identifiers.   I discussed the limitations, risks, security and privacy concerns of performing an evaluation and management service by telephone and the availability of in person appointments. I also discussed with the patient that there may be a patient responsible charge related to the service.  The patient expressed understanding and agrees to proceed.    Chief Complaint  Patient presents with   Acute Visit    [place] 03-28-2022, left side hurts, head hit first, ribs hurt, pt laid there in the yard for 30 minutes before her husband found her and called EMS, did a CT body scan, no broken bones   Telephone Assessment    Phone call   Telephone Screen    [phone]     HPI Bonnie West presents for a telehealth virtual visit for a fall on May 8.  Patient reports that the left side of her head head hurts and her ribs.  She reports that she was laying in the yard outside for 30 minutes before her husband found her and called EMS.  A CT scan was done and she had no broken bones per ER report.  The CT scan of her head also did not show any acute abnormalities.  She also had a fall back in March and hit her head then as well and had a negative CT scan of the head after being seen in the office for the fall Got a walker from the ER.  She initially went to the ER for this most recent fall on May 8 and then went to urgent care on May 13 due to severe rib pain.  Current Medication: Outpatient Encounter Medications as of 04/04/2022  Medication Sig   acetaminophen (TYLENOL) 500 MG tablet Take 1,000 mg by mouth 2 (two) times daily as needed for mild pain or moderate pain.   albuterol (VENTOLIN HFA) 108 (90 Base) MCG/ACT inhaler  INHALE 2 PUFFS BY MOUTH EVERY 6 HOURS   ALPRAZolam (XANAX) 0.25 MG tablet Take 1 tablet (0.25 mg total) by mouth 2 (two) times daily as needed for anxiety.   baclofen (LIORESAL) 10 MG tablet TAKE 1 TABLET BY MOUTH TWICE A DAY AS NEEDED FOR MUSCLE SPASMS   bisacodyl (DULCOLAX) 5 MG EC tablet Take 5-10 mg by mouth at bedtime as needed for moderate constipation. Two at night   cetirizine (ZYRTEC) 10 MG tablet Take 10 mg by mouth daily.   cholecalciferol (VITAMIN D) 1000 units tablet Take 1,000 Units by mouth at bedtime.   clindamycin (CLEOCIN T) 1 % external solution APPLY 1 APPLICATION TOPICALLY 2 (TWO) TIMES DAILY AS NEEDED. FOR SKIN BREAKDOWN   cyclobenzaprine (FLEXERIL) 5 MG tablet Take 5-10 mg by mouth 2 (two) times daily as needed.   docusate sodium (COLACE) 100 MG capsule Take 100-200 mg by mouth at bedtime as needed for mild constipation or moderate constipation.    estradiol (ESTRACE) 0.1 MG/GM vaginal cream INSERT 1 GRAM VAGINALLY TWICE WEEKLY AT BEDTIME   fenofibrate (TRICOR) 145 MG tablet Take 1 tablet (145 mg total) by mouth daily.   FLUoxetine (PROZAC) 40 MG capsule Take 1 capsule (40 mg total) by mouth daily.   fluticasone (FLOVENT HFA) 110 MCG/ACT inhaler USE 2 PUFFS INHALED TWICE DAILY   [  EXPIRED] HYDROcodone-acetaminophen (NORCO/VICODIN) 5-325 MG tablet Take 1 tablet by mouth 3 (three) times daily as needed for up to 5 days.   ipratropium (ATROVENT HFA) 17 MCG/ACT inhaler INHALE 2 PUFFS 4 TIMES DAILY   ipratropium-albuterol (DUONEB) 0.5-2.5 (3) MG/3ML SOLN Take 3 mLs by nebulization every 4 (four) hours as needed (SOB, wheezing).   isometheptene-acetaminophen-dichloralphenazone (MIDRIN) 65-100-325 MG capsule Take 1 capsule by mouth 4 (four) times daily as needed for migraine. Maximum 5 capsules in 12 hours for migraine headaches, 8 capsules in 24 hours for tension headaches.   levothyroxine (SYNTHROID) 100 MCG tablet TAKE 1 TABLET BY MOUTH EVERY DAY IN THE MORNING   lisinopril  (ZESTRIL) 10 MG tablet TAKE 1 TABLET BY MOUTH EVERY DAY   magnesium oxide (MAG-OX) 400 MG tablet Take 400 mg by mouth at bedtime.    montelukast (SINGULAIR) 10 MG tablet Take 1 tablet (10 mg total) by mouth daily.   omeprazole (PRILOSEC) 40 MG capsule TAKE 1 CAPSULE BY MOUTH EVERY DAY   polyethylene glycol powder (GLYCOLAX/MIRALAX) 17 GM/SCOOP powder Take 17 g by mouth every evening.   triamcinolone cream (KENALOG) 0.1 % Apply 1 application topically 2 (two) times daily as needed. For skin breakdown   vitamin B-12 (CYANOCOBALAMIN) 1000 MCG tablet Take 1,000 mcg by mouth daily.   No facility-administered encounter medications on file as of 04/04/2022.    Surgical History: Past Surgical History:  Procedure Laterality Date   BUNIONECTOMY     CATARACT EXTRACTION W/PHACO Right 10/11/2016   Procedure: CATARACT EXTRACTION PHACO AND INTRAOCULAR LENS PLACEMENT (IOC);  Surgeon: Birder Robson, MD;  Location: [facility] ORS;  Service: Ophthalmology;  Laterality: Right;  Lot# [id number] H [place]: 00:37.7 AP%: 18.1 CDE: 6.80   CATARACT EXTRACTION W/PHACO Left 11/08/2016   Procedure: CATARACT EXTRACTION PHACO AND INTRAOCULAR LENS PLACEMENT ([place]);  Surgeon: Birder Robson, MD;  Location: [facility] ORS;  Service: Ophthalmology;  Laterality: Left;  PACK LOT: [id number] H US:00:32 AP:44 CDE:6.46   COLONOSCOPY     COLONOSCOPY WITH PROPOFOL N/A 11/26/2015   Procedure: COLONOSCOPY WITH PROPOFOL;  Surgeon: Manya Silvas, MD;  Location: [facility] ENDOSCOPY;  Service: Endoscopy;  Laterality: N/A;   EYE SURGERY  07/23/2021   EYE SURGERY Left 09/24/2021   strabusmis, double vision   TONSILLECTOMY      Medical History: Past Medical History:  Diagnosis Date   Allergic rhinitis    Anxiety    Asthma    Blood transfusion without reported diagnosis    Depression    Diverticulosis    Dyspnea    Esophagitis    GERD (gastroesophageal reflux disease)    Headache    Heart murmur    Hyperlipemia    Hypertension     Hypothyroidism    Obesity    Osteopenia    Palpitations    Pneumothorax    Sleep apnea     Family History: Family History  Problem Relation Age of Onset   Breast cancer Maternal Grandmother    Stroke Maternal Grandmother    Bladder Cancer Father    Prostate cancer Father    Heart attack Father    Aortic aneurysm Father    Congestive Heart Failure Father    Colon cancer Paternal Grandmother    Aneurysm Paternal Grandmother    Stroke Paternal Grandmother    Congestive Heart Failure Paternal Grandmother    Dementia Mother    Stroke Mother    Dementia Maternal Grandfather    Stroke Maternal Grandfather    Dementia Paternal Grandfather  Stroke Paternal Grandfather     Social History   Socioeconomic History   Marital status: Married    Spouse name: Not on file   Number of children: Not on file   Years of education: Not on file   Highest education level: Not on file  Occupational History   Not on file  Tobacco Use   Smoking status: Former   Smokeless tobacco: Never  Vaping Use   Vaping Use: Never used  Substance and Sexual Activity   Alcohol use: No   Drug use: No   Sexual activity: Not Currently    Partners: Male    Birth control/protection: Post-menopausal  Other Topics Concern   Not on file  Social History Narrative   Not on file   Social Determinants of Health   Financial Resource Strain: Not on file  Food Insecurity: Not on file  Transportation Needs: Not on file  Physical Activity: Not on file  Stress: Not on file  Social Connections: Not on file  Intimate Partner Violence: Not on file      Review of Systems  Constitutional:  Positive for fatigue.  Respiratory:  Positive for shortness of breath. Negative for cough, chest tightness and wheezing.        Rib pain and bruising  Cardiovascular: Negative.  Negative for chest pain and palpitations.  Musculoskeletal:  Positive for arthralgias, back pain and myalgias.  Neurological:  Positive for  headaches.   Vital Signs: BP 107/82   Pulse 69   Resp 16   Ht '5\' 1"'$  (1.549 m)   Wt 154 lb (69.9 kg)   BMI 29.10 kg/m    Observation/Objective: She is alert and oriented and engages in conversation appropriately.  He does not sound as though she is not in any acute distress over telephone call.    Assessment/Plan: 1. Sprain of left hip, subsequent encounter Pain in the left hip has increased since the most recent fall and patient is wanting to see physical therapy for strengthening and stretching to help with pain relief - Ambulatory referral to Physical Therapy  2. History of recent fall Recent fall in the past week with a previous fall in March this year, referred to physical therapy - Ambulatory referral to Physical Therapy  3. Contusion of rib on left side, subsequent encounter Was recently seen in urgent care for increased pain on the left side after the fall.  Her pain is currently manageable and she does not need any further medications  4. Minor head trauma Prior CT scan the day of the fall was negative for any acute abnormalities.   General Counseling: guerline happ understanding of the findings of today's phone visit and agrees with plan of treatment. I have discussed any further diagnostic evaluation that may be needed or ordered today. We also reviewed her medications today. she has been encouraged to call the office with any questions or concerns that should arise related to todays visit.  Return if symptoms worsen or fail to improve.   Orders Placed This Encounter  Procedures   Ambulatory referral to Physical Therapy    No orders of the defined types were placed in this encounter.   Time spent:30 Minutes Time spent with patient included reviewing progress notes, labs, imaging studies, and discussing plan for follow up.  [place] Controlled Substance Database was reviewed by me for overdose risk score (ORS) if appropriate.  This patient was seen by [name], FNP-C in collaboration with Dr. Clayborn Bigness as a  part of collaborative care agreement.  Malini Flemings R. Valetta Fuller, MSN, FNP-C Internal medicine

## 2022-04-06 ENCOUNTER — Telehealth: Payer: Medicare Other

## 2022-04-19 ENCOUNTER — Telehealth: Payer: Self-pay | Admitting: Pharmacist

## 2022-04-19 NOTE — Progress Notes (Signed)
[place] [facility])  [place] Team    04/19/2022  RANE DUMM 22-Dec-1950 [id number]  Reason for referral: Medication Review  Referral source: Dr. Posey Pronto  Outreach:  Unsuccessful telephone call with Mrs. [name]. Left a HIPAA compliant voicemail for patient to return my call.    Plan: Will follow-up in 3-5 business days.   Loretha Brasil, PharmD [place] Pharmacist Office: [phone]

## 2022-04-21 NOTE — Progress Notes (Signed)
[facility])  [place] Team    04/21/2022  [name] [birth year] [id number]  Reason for referral: Medication Review   Referral source: Dr. Posey Pronto   Outreach:  Second unsuccessful telephone call attempt for [name]. Left a HIPAA compliant voicemail for patient to return my call.      Plan: Will follow-up in 3-5 business days.     Loretha Brasil, PharmD [place] Pharmacist Office: [phone]

## 2022-04-24 ENCOUNTER — Encounter: Payer: Self-pay | Admitting: Nurse Practitioner

## 2022-04-25 NOTE — Telephone Encounter (Signed)
Physical Therapy referral faxed to [facility] PT-[name]

## 2022-04-28 NOTE — Progress Notes (Signed)
[place])  [place] Team    04/28/2022  Bonnie West 1951-09-22 [id number]  Reason for referral: Medication Review  Referral source: Dr. Posey Pronto  Outreach:  Unsuccessful telephone call with [name].  HIPAA compliant voice message left with direct call back number to return my call.   Plan: Closing case at this time due to 3 unsuccessful call attempts. I am happy to assist patient in the future if patient is willing to engage.    Loretha Brasil, PharmD [place] Pharmacist Office: [phone]

## 2022-05-09 ENCOUNTER — Ambulatory Visit (INDEPENDENT_AMBULATORY_CARE_PROVIDER_SITE_OTHER): Payer: Medicare Other | Admitting: Nurse Practitioner

## 2022-05-09 ENCOUNTER — Encounter: Payer: Self-pay | Admitting: Nurse Practitioner

## 2022-05-09 VITALS — BP 140/81 | HR 71 | Temp 98.3°F | Resp 16 | Ht 61.0 in | Wt 148.8 lb

## 2022-05-09 DIAGNOSIS — F411 Generalized anxiety disorder: Secondary | ICD-10-CM | POA: Diagnosis not present

## 2022-05-09 DIAGNOSIS — J4540 Moderate persistent asthma, uncomplicated: Secondary | ICD-10-CM

## 2022-05-09 DIAGNOSIS — M545 Low back pain, unspecified: Secondary | ICD-10-CM | POA: Diagnosis not present

## 2022-05-09 DIAGNOSIS — E039 Hypothyroidism, unspecified: Secondary | ICD-10-CM | POA: Diagnosis not present

## 2022-05-09 DIAGNOSIS — I10 Essential (primary) hypertension: Secondary | ICD-10-CM | POA: Diagnosis not present

## 2022-05-09 DIAGNOSIS — G4709 Other insomnia: Secondary | ICD-10-CM

## 2022-05-09 DIAGNOSIS — E782 Mixed hyperlipidemia: Secondary | ICD-10-CM | POA: Diagnosis not present

## 2022-05-09 MED ORDER — FLUOXETINE HCL 40 MG PO CAPS: 40.0000 mg | ORAL_CAPSULE | Freq: Every day | ORAL | 1 refills | Status: DC

## 2022-05-09 MED ORDER — ALPRAZOLAM 0.25 MG PO TABS: 0.2500 mg | ORAL_TABLET | Freq: Two times a day (BID) | ORAL | 2 refills | Status: DC | PRN

## 2022-05-09 MED ORDER — LEVOTHYROXINE SODIUM 100 MCG PO TABS: ORAL_TABLET | ORAL | 3 refills | Status: DC

## 2022-05-09 MED ORDER — LISINOPRIL 10 MG PO TABS: 10.0000 mg | ORAL_TABLET | Freq: Every day | ORAL | 3 refills | Status: DC

## 2022-05-09 MED ORDER — FLUTICASONE PROPIONATE HFA 110 MCG/ACT IN AERO: INHALATION_SPRAY | RESPIRATORY_TRACT | 3 refills | Status: DC

## 2022-05-09 MED ORDER — BACLOFEN 10 MG PO TABS: ORAL_TABLET | ORAL | 1 refills | Status: DC

## 2022-05-09 MED ORDER — CYCLOBENZAPRINE HCL 5 MG PO TABS: 5.0000 mg | ORAL_TABLET | Freq: Every day | ORAL | 2 refills | Status: DC

## 2022-05-09 MED ORDER — FENOFIBRATE 145 MG PO TABS: 145.0000 mg | ORAL_TABLET | Freq: Every day | ORAL | 1 refills | Status: DC

## 2022-05-09 MED ORDER — ALBUTEROL SULFATE HFA 108 (90 BASE) MCG/ACT IN AERS: INHALATION_SPRAY | RESPIRATORY_TRACT | 5 refills | Status: DC

## 2022-05-09 MED ORDER — ATROVENT HFA 17 MCG/ACT IN AERS: INHALATION_SPRAY | RESPIRATORY_TRACT | 3 refills | Status: DC

## 2022-05-09 MED ORDER — HYDROCODONE-ACETAMINOPHEN 10-325 MG PO TABS: 1.0000 | ORAL_TABLET | Freq: Four times a day (QID) | ORAL | 0 refills | Status: AC | PRN

## 2022-05-09 NOTE — Progress Notes (Addendum)
[facility] [place], [place] [address]  Internal MEDICINE  Office Visit Note  Patient Name: Bonnie West  [id number]  [id number]  Date of Service: 05/09/2022  Chief Complaint  Patient presents with   Follow-up   Depression   Gastroesophageal Reflux   Hypertension   Hyperlipidemia    HPI Braedyn presents for follow-up visit for continued back pain and left sided rib pain.  This is after a fall and she did initially go to the ER and imaging was done but there were no fractures.  We had a virtual visit previously but she declined any pain medication because she did get pain medication from the ER and then also from urgent care. She has now run out of pain medication and is still hurting.  She will also be starting physical therapy soon which will help improve her back pain. She does music for the church and over the weekend have been looking in a closet moving boxes to find a specific song that no charge wanted to do and she may have pulled a muscle on the left side of the torso where she had bruised her ribs because the pain has gotten worse but as she reported she was lifting boxes and moving some things around She is also having trouble sleeping and she is wanting to try [place] again. The last time she was prescribed this medication was in 2021.     Current Medication: Outpatient Encounter Medications as of 05/09/2022  Medication Sig   acetaminophen (TYLENOL) 500 MG tablet Take 1,000 mg by mouth 2 (two) times daily as needed for mild pain or moderate pain.   bisacodyl (DULCOLAX) 5 MG EC tablet Take 5-10 mg by mouth at bedtime as needed for moderate constipation. Two at night   cetirizine (ZYRTEC) 10 MG tablet Take 10 mg by mouth daily.   cholecalciferol (VITAMIN D) 1000 units tablet Take 1,000 Units by mouth at bedtime.   clindamycin (CLEOCIN T) 1 % external solution APPLY 1 APPLICATION TOPICALLY 2 (TWO) TIMES DAILY AS NEEDED. FOR SKIN BREAKDOWN   docusate  sodium (COLACE) 100 MG capsule Take 100-200 mg by mouth at bedtime as needed for mild constipation or moderate constipation.    estradiol (ESTRACE) 0.1 MG/GM vaginal cream INSERT 1 GRAM VAGINALLY TWICE WEEKLY AT BEDTIME   HYDROcodone-acetaminophen (NORCO) 10-325 MG tablet Take 1 tablet by mouth every 6 (six) hours as needed for up to 5 days for moderate pain or severe pain.   ipratropium-albuterol (DUONEB) 0.5-2.5 (3) MG/3ML SOLN Take 3 mLs by nebulization every 4 (four) hours as needed (SOB, wheezing).   isometheptene-acetaminophen-dichloralphenazone (MIDRIN) 65-100-325 MG capsule Take 1 capsule by mouth 4 (four) times daily as needed for migraine. Maximum 5 capsules in 12 hours for migraine headaches, 8 capsules in 24 hours for tension headaches.   magnesium oxide (MAG-OX) 400 MG tablet Take 400 mg by mouth at bedtime.    montelukast (SINGULAIR) 10 MG tablet Take 1 tablet (10 mg total) by mouth daily.   omeprazole (PRILOSEC) 40 MG capsule TAKE 1 CAPSULE BY MOUTH EVERY DAY   polyethylene glycol powder (GLYCOLAX/MIRALAX) 17 GM/SCOOP powder Take 17 g by mouth every evening.   triamcinolone cream (KENALOG) 0.1 % Apply 1 application topically 2 (two) times daily as needed. For skin breakdown   vitamin B-12 (CYANOCOBALAMIN) 1000 MCG tablet Take 1,000 mcg by mouth daily.   [DISCONTINUED] albuterol (VENTOLIN HFA) 108 (90 Base) MCG/ACT inhaler INHALE 2 PUFFS BY MOUTH EVERY 6 HOURS   [  DISCONTINUED] ALPRAZolam (XANAX) 0.25 MG tablet Take 1 tablet (0.25 mg total) by mouth 2 (two) times daily as needed for anxiety.   [DISCONTINUED] baclofen (LIORESAL) 10 MG tablet TAKE 1 TABLET BY MOUTH TWICE A DAY AS NEEDED FOR MUSCLE SPASMS   [DISCONTINUED] cyclobenzaprine (FLEXERIL) 5 MG tablet Take 5-10 mg by mouth 2 (two) times daily as needed.   [DISCONTINUED] fenofibrate (TRICOR) 145 MG tablet Take 1 tablet (145 mg total) by mouth daily.   [DISCONTINUED] FLUoxetine (PROZAC) 40 MG capsule Take 1 capsule (40 mg total) by  mouth daily.   [DISCONTINUED] fluticasone (FLOVENT HFA) 110 MCG/ACT inhaler USE 2 PUFFS INHALED TWICE DAILY   [DISCONTINUED] ipratropium (ATROVENT HFA) 17 MCG/ACT inhaler INHALE 2 PUFFS 4 TIMES DAILY   [DISCONTINUED] levothyroxine (SYNTHROID) 100 MCG tablet TAKE 1 TABLET BY MOUTH EVERY DAY IN THE MORNING   [DISCONTINUED] lisinopril (ZESTRIL) 10 MG tablet TAKE 1 TABLET BY MOUTH EVERY DAY   albuterol (VENTOLIN HFA) 108 (90 Base) MCG/ACT inhaler INHALE 2 PUFFS BY MOUTH EVERY 6 HOURS   ALPRAZolam (XANAX) 0.25 MG tablet Take 1 tablet (0.25 mg total) by mouth 2 (two) times daily as needed for anxiety.   baclofen (LIORESAL) 10 MG tablet TAKE 1 TABLET BY MOUTH TWICE A DAY AS NEEDED FOR MUSCLE SPASMS   cyclobenzaprine (FLEXERIL) 5 MG tablet Take 1-2 tablets (5-10 mg total) by mouth at bedtime.   fenofibrate (TRICOR) 145 MG tablet Take 1 tablet (145 mg total) by mouth daily.   FLUoxetine (PROZAC) 40 MG capsule Take 1 capsule (40 mg total) by mouth daily.   fluticasone (FLOVENT HFA) 110 MCG/ACT inhaler USE 2 PUFFS INHALED TWICE DAILY   ipratropium (ATROVENT HFA) 17 MCG/ACT inhaler INHALE 2 PUFFS 4 TIMES DAILY   levothyroxine (SYNTHROID) 100 MCG tablet TAKE 1 TABLET BY MOUTH EVERY DAY IN THE MORNING   lisinopril (ZESTRIL) 10 MG tablet Take 1 tablet (10 mg total) by mouth daily.   No facility-administered encounter medications on file as of 05/09/2022.    Surgical History: Past Surgical History:  Procedure Laterality Date   BUNIONECTOMY     CATARACT EXTRACTION W/PHACO Right 10/11/2016   Procedure: CATARACT EXTRACTION PHACO AND INTRAOCULAR LENS PLACEMENT (IOC);  Surgeon: Birder Robson, MD;  Location: [facility] ORS;  Service: Ophthalmology;  Laterality: Right;  Lot# [id number] H [place]: 00:37.7 AP%: 18.1 CDE: 6.80   CATARACT EXTRACTION W/PHACO Left 11/08/2016   Procedure: CATARACT EXTRACTION PHACO AND INTRAOCULAR LENS PLACEMENT ([place]);  Surgeon: Birder Robson, MD;  Location: [facility] ORS;  Service: Ophthalmology;   Laterality: Left;  PACK LOT: [id number] H US:00:32 AP:44 CDE:6.46   COLONOSCOPY     COLONOSCOPY WITH PROPOFOL N/A 11/26/2015   Procedure: COLONOSCOPY WITH PROPOFOL;  Surgeon: Manya Silvas, MD;  Location: [facility];  Service: Endoscopy;  Laterality: N/A;   EYE SURGERY  07/23/2021   EYE SURGERY Left 09/24/2021   strabusmis, double vision   TONSILLECTOMY      Medical History: Past Medical History:  Diagnosis Date   Allergic rhinitis    Anxiety    Asthma    Blood transfusion without reported diagnosis    Depression    Diverticulosis    Dyspnea    Esophagitis    GERD (gastroesophageal reflux disease)    Headache    Heart murmur    Hyperlipemia    Hypertension    Hypothyroidism    Obesity    Osteopenia    Palpitations    Pneumothorax    Sleep apnea     Family History:  Family History  Problem Relation Age of Onset   Breast cancer Maternal Grandmother    Stroke Maternal Grandmother    Bladder Cancer Father    Prostate cancer Father    Heart attack Father    Aortic aneurysm Father    Congestive Heart Failure Father    Colon cancer Paternal Grandmother    Aneurysm Paternal Grandmother    Stroke Paternal Grandmother    Congestive Heart Failure Paternal Grandmother    Dementia Mother    Stroke Mother    Dementia Maternal Grandfather    Stroke Maternal Grandfather    Dementia Paternal Grandfather    Stroke Paternal Grandfather     Social History   Socioeconomic History   Marital status: Married    Spouse name: Not on file   Number of children: Not on file   Years of education: Not on file   Highest education level: Not on file  Occupational History   Not on file  Tobacco Use   Smoking status: Former   Smokeless tobacco: Never  Vaping Use   Vaping Use: Never used  Substance and Sexual Activity   Alcohol use: No   Drug use: No   Sexual activity: Not Currently    Partners: Male    Birth control/protection: Post-menopausal  Other Topics Concern    Not on file  Social History Narrative   Not on file   Social Determinants of Health   Financial Resource Strain: Low Risk  (03/23/2021)   Overall Financial Resource Strain (CARDIA)    Difficulty of Paying Living Expenses: Not very hard  Food Insecurity: Not on file  Transportation Needs: Not on file  Physical Activity: Not on file  Stress: Not on file  Social Connections: Not on file  Intimate Partner Violence: Not on file      Review of Systems  Constitutional:  Positive for fatigue.  HENT: Negative.    Respiratory:  Positive for shortness of breath. Negative for cough, chest tightness and wheezing.        Rib pain and bruising  Cardiovascular: Negative.  Negative for chest pain and palpitations.  Genitourinary: Negative.   Musculoskeletal:  Positive for arthralgias, back pain and myalgias.  Neurological:  Positive for headaches.  Hematological: Negative.     Vital Signs: BP 140/81   Pulse 71   Temp 98.3 F (36.8 C)   Resp 16   Ht '5\' 1"'$  (1.549 m)   Wt 148 lb 12.8 oz (67.5 kg)   SpO2 97%   BMI 28.12 kg/m    Physical Exam Constitutional:      General: She is not in acute distress.    Appearance: Normal appearance. She is not ill-appearing.  HENT:     Head: Normocephalic and atraumatic.  Eyes:     Pupils: Pupils are equal, round, and reactive to light.  Cardiovascular:     Rate and Rhythm: Normal rate and regular rhythm.  Pulmonary:     Effort: Pulmonary effort is normal. No respiratory distress.  Neurological:     Mental Status: She is alert and oriented to person, place, and time.  Psychiatric:        Mood and Affect: Mood normal.        Behavior: Behavior normal.        Assessment/Plan: 1. Acute midline low back pain without sciatica 5-day prescription for hydrocodone-acetaminophen prescribed, instructed patient to call for refill once she has used the 20 tablets and she can have an additional refill.  cyclobenzaprine prescribed to take at bedtime  for muscle spasms and musculoskeletal pain. - cyclobenzaprine (FLEXERIL) 5 MG tablet; Take 1-2 tablets (5-10 mg total) by mouth at bedtime.  Dispense: 60 tablet; Refill: 2 - baclofen (LIORESAL) 10 MG tablet; TAKE 1 TABLET BY MOUTH TWICE A DAY AS NEEDED FOR MUSCLE SPASMS  Dispense: 180 tablet; Refill: 1 - HYDROcodone-acetaminophen (NORCO) 10-325 MG tablet; Take 1 tablet by mouth every 6 (six) hours as needed for up to 5 days for moderate pain or severe pain.  Dispense: 20 tablet; Refill: 0  2. Moderate persistent asthma without status asthmaticus without complication Stable, refills ordered - ipratropium (ATROVENT HFA) 17 MCG/ACT inhaler; INHALE 2 PUFFS 4 TIMES DAILY  Dispense: 1 each; Refill: 3 - fluticasone (FLOVENT HFA) 110 MCG/ACT inhaler; USE 2 PUFFS INHALED TWICE DAILY  Dispense: 36 each; Refill: 3 - albuterol (VENTOLIN HFA) 108 (90 Base) MCG/ACT inhaler; INHALE 2 PUFFS BY MOUTH EVERY 6 HOURS  Dispense: 18 g; Refill: 5  3. Essential hypertension, benign Stable refills ordered - lisinopril (ZESTRIL) 10 MG tablet; Take 1 tablet (10 mg total) by mouth daily.  Dispense: 90 tablet; Refill: 3  4. Acquired hypothyroidism Stable refills ordered - levothyroxine (SYNTHROID) 100 MCG tablet; TAKE 1 TABLET BY MOUTH EVERY DAY IN THE MORNING  Dispense: 90 tablet; Refill: 3  5. Mixed hyperlipidemia Refills ordered - fenofibrate (TRICOR) 145 MG tablet; Take 1 tablet (145 mg total) by mouth daily.  Dispense: 90 tablet; Refill: 1  6. GAD (generalized anxiety disorder) Due for refills, follow-up in 3 months for additional refills of alprazolam - FLUoxetine (PROZAC) 40 MG capsule; Take 1 capsule (40 mg total) by mouth daily.  Dispense: 90 capsule; Refill: 1 - ALPRAZolam (XANAX) 0.25 MG tablet; Take 1 tablet (0.25 mg total) by mouth 2 (two) times daily as needed for anxiety.  Dispense: 60 tablet; Refill: 2  7. Other insomnia Will try small dose of ambien again.  - zolpidem (AMBIEN) 5 MG tablet; Take  0.5-1 tablets (2.5-5 mg total) by mouth at bedtime as needed for sleep.  Dispense: 30 tablet; Refill: 0   General Counseling: Yasmin verbalizes understanding of the findings of todays visit and agrees with plan of treatment. I have discussed any further diagnostic evaluation that may be needed or ordered today. We also reviewed her medications today. she has been encouraged to call the office with any questions or concerns that should arise related to todays visit.    No orders of the defined types were placed in this encounter.   Meds ordered this encounter  Medications   FLUoxetine (PROZAC) 40 MG capsule    Sig: Take 1 capsule (40 mg total) by mouth daily.    Dispense:  90 capsule    Refill:  1   cyclobenzaprine (FLEXERIL) 5 MG tablet    Sig: Take 1-2 tablets (5-10 mg total) by mouth at bedtime.    Dispense:  60 tablet    Refill:  2   fenofibrate (TRICOR) 145 MG tablet    Sig: Take 1 tablet (145 mg total) by mouth daily.    Dispense:  90 tablet    Refill:  1   ALPRAZolam (XANAX) 0.25 MG tablet    Sig: Take 1 tablet (0.25 mg total) by mouth 2 (two) times daily as needed for anxiety.    Dispense:  60 tablet    Refill:  2    F41.0; may hae first fill on 11/10/21   lisinopril (ZESTRIL) 10 MG tablet  Sig: Take 1 tablet (10 mg total) by mouth daily.    Dispense:  90 tablet    Refill:  3   levothyroxine (SYNTHROID) 100 MCG tablet    Sig: TAKE 1 TABLET BY MOUTH EVERY DAY IN THE MORNING    Dispense:  90 tablet    Refill:  3   ipratropium (ATROVENT HFA) 17 MCG/ACT inhaler    Sig: INHALE 2 PUFFS 4 TIMES DAILY    Dispense:  1 each    Refill:  3   fluticasone (FLOVENT HFA) 110 MCG/ACT inhaler    Sig: USE 2 PUFFS INHALED TWICE DAILY    Dispense:  36 each    Refill:  3   baclofen (LIORESAL) 10 MG tablet    Sig: TAKE 1 TABLET BY MOUTH TWICE A DAY AS NEEDED FOR MUSCLE SPASMS    Dispense:  180 tablet    Refill:  1   albuterol (VENTOLIN HFA) 108 (90 Base) MCG/ACT inhaler    Sig:  INHALE 2 PUFFS BY MOUTH EVERY 6 HOURS    Dispense:  18 g    Refill:  5   HYDROcodone-acetaminophen (NORCO) 10-325 MG tablet    Sig: Take 1 tablet by mouth every 6 (six) hours as needed for up to 5 days for moderate pain or severe pain.    Dispense:  20 tablet    Refill:  0    Call clinic if need additional refill.    Return in about 3 months (around 08/09/2022) for F/U, med refill, Karam Dunson PCP.   Total time spent:30 Minutes Time spent includes review of chart, medications, test results, and follow up plan with the patient.   [place] Controlled Substance Database was reviewed by me.  This patient was seen by [name], FNP-C in collaboration with Dr. Clayborn Bigness as a part of collaborative care agreement.   Euva Rundell R. Valetta Fuller, MSN, FNP-C Internal medicine

## 2022-05-10 ENCOUNTER — Encounter: Payer: Self-pay | Admitting: Nurse Practitioner

## 2022-05-26 ENCOUNTER — Other Ambulatory Visit: Payer: Self-pay

## 2022-05-26 DIAGNOSIS — K219 Gastro-esophageal reflux disease without esophagitis: Secondary | ICD-10-CM

## 2022-05-26 MED ORDER — OMEPRAZOLE 40 MG PO CPDR: DELAYED_RELEASE_CAPSULE | ORAL | 3 refills | Status: DC

## 2022-05-27 MED ORDER — ZOLPIDEM TARTRATE 5 MG PO TABS: 2.5000 mg | ORAL_TABLET | Freq: Every evening | ORAL | 0 refills | Status: DC | PRN

## 2022-05-27 NOTE — Addendum Note (Signed)
Addended by: [name] on: 05/27/2022 05:36 AM   Modules accepted: Orders

## 2022-08-04 ENCOUNTER — Encounter: Payer: Self-pay | Admitting: Nurse Practitioner

## 2022-08-04 ENCOUNTER — Ambulatory Visit (INDEPENDENT_AMBULATORY_CARE_PROVIDER_SITE_OTHER): Payer: Medicare Other | Admitting: Nurse Practitioner

## 2022-08-04 VITALS — BP 139/82 | HR 70 | Temp 98.1°F | Resp 16 | Ht 61.0 in | Wt 146.0 lb

## 2022-08-04 DIAGNOSIS — M545 Low back pain, unspecified: Secondary | ICD-10-CM | POA: Diagnosis not present

## 2022-08-04 DIAGNOSIS — Z760 Encounter for issue of repeat prescription: Secondary | ICD-10-CM | POA: Diagnosis not present

## 2022-08-04 DIAGNOSIS — J4540 Moderate persistent asthma, uncomplicated: Secondary | ICD-10-CM

## 2022-08-04 DIAGNOSIS — F411 Generalized anxiety disorder: Secondary | ICD-10-CM | POA: Diagnosis not present

## 2022-08-04 DIAGNOSIS — G4709 Other insomnia: Secondary | ICD-10-CM

## 2022-08-04 DIAGNOSIS — G8929 Other chronic pain: Secondary | ICD-10-CM

## 2022-08-04 MED ORDER — ATROVENT HFA 17 MCG/ACT IN AERS: INHALATION_SPRAY | RESPIRATORY_TRACT | 3 refills | Status: DC

## 2022-08-04 MED ORDER — HYDROCODONE-ACETAMINOPHEN 5-325 MG PO TABS: 1.0000 | ORAL_TABLET | Freq: Every day | ORAL | 0 refills | Status: DC | PRN

## 2022-08-04 MED ORDER — FLUOXETINE HCL 40 MG PO CAPS: 40.0000 mg | ORAL_CAPSULE | Freq: Every day | ORAL | 1 refills | Status: DC

## 2022-08-04 MED ORDER — ZOLPIDEM TARTRATE 5 MG PO TABS: 2.5000 mg | ORAL_TABLET | Freq: Every evening | ORAL | 0 refills | Status: DC | PRN

## 2022-08-04 MED ORDER — ALPRAZOLAM 0.25 MG PO TABS: 0.2500 mg | ORAL_TABLET | Freq: Two times a day (BID) | ORAL | 2 refills | Status: DC | PRN

## 2022-08-04 MED ORDER — MONTELUKAST SODIUM 10 MG PO TABS: 10.0000 mg | ORAL_TABLET | Freq: Every day | ORAL | 3 refills | Status: DC

## 2022-08-04 MED ORDER — FLUTICASONE PROPIONATE HFA 110 MCG/ACT IN AERO: INHALATION_SPRAY | RESPIRATORY_TRACT | 3 refills | Status: DC

## 2022-08-04 NOTE — Progress Notes (Signed)
[facility] [place], [place] [address]  Internal MEDICINE  Office Visit Note  Patient Name: Bonnie West  [id number]  [id number]  Date of Service: 08/04/2022  Chief Complaint  Patient presents with   Follow-up    Follow up med refill   Depression   Gastroesophageal Reflux   Hyperlipidemia   Hypertension    HPI Bonnie West presents for a follow up visit for hypertension, high cholesterol, and medication refills.  Anxiety -- due for alprazolam and fluoxetine refills. Stable, medications are helping, anxiety is manageable. Residual pain from fall in may -- requesting 1 refill of hydrocodone refill due to continued residual pain from falling back in May this year.  Uterine prolapse ? Bladder incontinence and leaking. Used to have a pessary, can see uterus in vaginal canal and has to put it back up.  Insomnia -- need refill of ambien, this has been helping her to sleep better of the past couple of months Need other routine medication refills.     Current Medication: Outpatient Encounter Medications as of 08/04/2022  Medication Sig   acetaminophen (TYLENOL) 500 MG tablet Take 1,000 mg by mouth 2 (two) times daily as needed for mild pain or moderate pain.   albuterol (VENTOLIN HFA) 108 (90 Base) MCG/ACT inhaler INHALE 2 PUFFS BY MOUTH EVERY 6 HOURS   baclofen (LIORESAL) 10 MG tablet TAKE 1 TABLET BY MOUTH TWICE A DAY AS NEEDED FOR MUSCLE SPASMS   bisacodyl (DULCOLAX) 5 MG EC tablet Take 5-10 mg by mouth at bedtime as needed for moderate constipation. Two at night   cetirizine (ZYRTEC) 10 MG tablet Take 10 mg by mouth daily.   cholecalciferol (VITAMIN D) 1000 units tablet Take 1,000 Units by mouth at bedtime.   clindamycin (CLEOCIN T) 1 % external solution APPLY 1 APPLICATION TOPICALLY 2 (TWO) TIMES DAILY AS NEEDED. FOR SKIN BREAKDOWN   cyclobenzaprine (FLEXERIL) 5 MG tablet Take 1-2 tablets (5-10 mg total) by mouth at bedtime.   docusate sodium (COLACE) 100 MG  capsule Take 100-200 mg by mouth at bedtime as needed for mild constipation or moderate constipation.    estradiol (ESTRACE) 0.1 MG/GM vaginal cream INSERT 1 GRAM VAGINALLY TWICE WEEKLY AT BEDTIME   fenofibrate (TRICOR) 145 MG tablet Take 1 tablet (145 mg total) by mouth daily.   HYDROcodone-acetaminophen (NORCO/VICODIN) 5-325 MG tablet Take 1 tablet by mouth daily as needed for severe pain.   ipratropium-albuterol (DUONEB) 0.5-2.5 (3) MG/3ML SOLN Take 3 mLs by nebulization every 4 (four) hours as needed (SOB, wheezing).   isometheptene-acetaminophen-dichloralphenazone (MIDRIN) 65-100-325 MG capsule Take 1 capsule by mouth 4 (four) times daily as needed for migraine. Maximum 5 capsules in 12 hours for migraine headaches, 8 capsules in 24 hours for tension headaches.   levothyroxine (SYNTHROID) 100 MCG tablet TAKE 1 TABLET BY MOUTH EVERY DAY IN THE MORNING   lisinopril (ZESTRIL) 10 MG tablet Take 1 tablet (10 mg total) by mouth daily.   magnesium oxide (MAG-OX) 400 MG tablet Take 400 mg by mouth at bedtime.    omeprazole (PRILOSEC) 40 MG capsule TAKE 1 CAPSULE BY MOUTH EVERY DAY   polyethylene glycol powder (GLYCOLAX/MIRALAX) 17 GM/SCOOP powder Take 17 g by mouth every evening.   triamcinolone cream (KENALOG) 0.1 % Apply 1 application topically 2 (two) times daily as needed. For skin breakdown   vitamin B-12 (CYANOCOBALAMIN) 1000 MCG tablet Take 1,000 mcg by mouth daily.   [DISCONTINUED] ALPRAZolam (XANAX) 0.25 MG tablet Take 1 tablet (0.25 mg total)  by mouth 2 (two) times daily as needed for anxiety.   [DISCONTINUED] FLUoxetine (PROZAC) 40 MG capsule Take 1 capsule (40 mg total) by mouth daily.   [DISCONTINUED] fluticasone (FLOVENT HFA) 110 MCG/ACT inhaler USE 2 PUFFS INHALED TWICE DAILY   [DISCONTINUED] ipratropium (ATROVENT HFA) 17 MCG/ACT inhaler INHALE 2 PUFFS 4 TIMES DAILY   [DISCONTINUED] montelukast (SINGULAIR) 10 MG tablet Take 1 tablet (10 mg total) by mouth daily.   [DISCONTINUED]  zolpidem (AMBIEN) 5 MG tablet Take 0.5-1 tablets (2.5-5 mg total) by mouth at bedtime as needed for sleep.   ALPRAZolam (XANAX) 0.25 MG tablet Take 1 tablet (0.25 mg total) by mouth 2 (two) times daily as needed for anxiety.   FLUoxetine (PROZAC) 40 MG capsule Take 1 capsule (40 mg total) by mouth daily.   fluticasone (FLOVENT HFA) 110 MCG/ACT inhaler USE 2 PUFFS INHALED TWICE DAILY   ipratropium (ATROVENT HFA) 17 MCG/ACT inhaler INHALE 2 PUFFS 4 TIMES DAILY   montelukast (SINGULAIR) 10 MG tablet Take 1 tablet (10 mg total) by mouth daily.   zolpidem (AMBIEN) 5 MG tablet Take 0.5-1 tablets (2.5-5 mg total) by mouth at bedtime as needed for sleep.   No facility-administered encounter medications on file as of 08/04/2022.    Surgical History: Past Surgical History:  Procedure Laterality Date   BUNIONECTOMY     CATARACT EXTRACTION W/PHACO Right 10/11/2016   Procedure: CATARACT EXTRACTION PHACO AND INTRAOCULAR LENS PLACEMENT (IOC);  Surgeon: Bonnie Robson, MD;  Location: [facility] ORS;  Service: Ophthalmology;  Laterality: Right;  Lot# [id number] H [place]: 00:37.7 AP%: 18.1 CDE: 6.80   CATARACT EXTRACTION W/PHACO Left 11/08/2016   Procedure: CATARACT EXTRACTION PHACO AND INTRAOCULAR LENS PLACEMENT ([place]);  Surgeon: Bonnie Robson, MD;  Location: [facility] ORS;  Service: Ophthalmology;  Laterality: Left;  PACK LOT: [id number] H US:00:32 AP:44 CDE:6.46   COLONOSCOPY     COLONOSCOPY WITH PROPOFOL N/A 11/26/2015   Procedure: COLONOSCOPY WITH PROPOFOL;  Surgeon: Bonnie Silvas, MD;  Location: [facility] END[facility]COPY;  Service: Endoscopy;  Laterality: N/A;   EYE SURGERY  07/23/2021   EYE SURGERY Left 09/24/2021   strabusmis, double vision   TONSILLECTOMY      Medical History: Past Medical History:  Diagnosis Date   Allergic rhinitis    Anxiety    Asthma    Blood transfusion without reported diagnosis    Depression    Diverticulosis    Dyspnea    Esophagitis    GERD (gastroesophageal reflux disease)     Headache    Heart murmur    Hyperlipemia    Hypertension    Hypothyroidism    Obesity    Osteopenia    Palpitations    Pneumothorax    Sleep apnea     Family History: Family History  Problem Relation Age of Onset   Breast cancer Maternal Grandmother    Stroke Maternal Grandmother    Bladder Cancer Father    Prostate cancer Father    Heart attack Father    Aortic aneurysm Father    Congestive Heart Failure Father    Colon cancer Paternal Grandmother    Aneurysm Paternal Grandmother    Stroke Paternal Grandmother    Congestive Heart Failure Paternal Grandmother    Dementia Mother    Stroke Mother    Dementia Maternal Grandfather    Stroke Maternal Grandfather    Dementia Paternal Grandfather    Stroke Paternal Grandfather     Social History   Socioeconomic History   Marital status: Married  Spouse name: Not on file   Number of children: Not on file   Years of education: Not on file   Highest education level: Not on file  Occupational History   Not on file  Tobacco Use   Smoking status: Former   Smokeless tobacco: Never  Vaping Use   Vaping Use: Never used  Substance and Sexual Activity   Alcohol use: No   Drug use: No   Sexual activity: Not Currently    Partners: Male    Birth control/protection: Post-menopausal  Other Topics Concern   Not on file  Social History Narrative   Not on file   Social Determinants of Health   Financial Resource Strain: Low Risk  (03/23/2021)   Overall Financial Resource Strain (CARDIA)    Difficulty of Paying Living Expenses: Not very hard  Food Insecurity: Not on file  Transportation Needs: Not on file  Physical Activity: Not on file  Stress: Not on file  Social Connections: Not on file  Intimate Partner Violence: Not on file      Review of Systems  Constitutional:  Negative for chills, fatigue and unexpected weight change.  HENT:  Negative for congestion, rhinorrhea, sneezing and sore throat.   Eyes:  Negative  for redness.  Respiratory:  Negative for cough, chest tightness and shortness of breath.   Cardiovascular: Negative.  Negative for chest pain and palpitations.  Gastrointestinal: Negative.  Negative for abdominal pain, constipation, diarrhea, nausea and vomiting.  Genitourinary:  Negative for dysuria and frequency.  Musculoskeletal:  Positive for arthralgias and back pain. Negative for joint swelling and neck pain.  Skin:  Negative for rash.  Neurological: Negative.  Negative for tremors and numbness.  Hematological:  Negative for adenopathy. Does not bruise/bleed easily.  Psychiatric/Behavioral:  Positive for sleep disturbance (improved with ambien). Negative for behavioral problems (Depression), self-injury and suicidal ideas. The patient is nervous/anxious (stable with medications).     Vital Signs: BP 139/82   Pulse 70   Temp 98.1 F (36.7 C)   Resp 16   Ht '5\' 1"'$  (1.549 m)   Wt 146 lb (66.2 kg)   SpO2 95%   BMI 27.59 kg/m    Physical Exam Vitals reviewed.  Constitutional:      General: She is not in acute distress.    Appearance: Normal appearance. She is normal weight. She is not ill-appearing.  HENT:     Head: Normocephalic and atraumatic.  Eyes:     Extraocular Movements: Extraocular movements intact.     Pupils: Pupils are equal, round, and reactive to light.  Cardiovascular:     Rate and Rhythm: Normal rate and regular rhythm.  Pulmonary:     Effort: Pulmonary effort is normal. No respiratory distress.  Neurological:     Mental Status: She is alert and oriented to person, place, and time.     Cranial Nerves: No cranial nerve deficit.     Coordination: Coordination normal.     Gait: Gait normal.  Psychiatric:        Mood and Affect: Mood normal.        Behavior: Behavior normal.        Assessment/Plan: 1. Chronic midline low back pain without sciatica 1 more refill ordered for pain medication to alleviate residual low back pain from fall in May. Take  medication prn as prescribed. No refills at this time.  - HYDROcodone-acetaminophen (NORCO/VICODIN) 5-325 MG tablet; Take 1 tablet by mouth daily as needed for severe pain.  Dispense:  30 tablet; Refill: 0  2. Other insomnia Improving, continue prn ambien as prescribed.   3. GAD (generalized anxiety disorder) Stable, continue prn alprazolam and daily fluoxetine as prescribed.   4. Medication refill - ALPRAZolam (XANAX) 0.25 MG tablet; Take 1 tablet (0.25 mg total) by mouth 2 (two) times daily as needed for anxiety.  Dispense: 60 tablet; Refill: 2 - HYDROcodone-acetaminophen (NORCO/VICODIN) 5-325 MG tablet; Take 1 tablet by mouth daily as needed for severe pain.  Dispense: 30 tablet; Refill: 0 - ipratropium (ATROVENT HFA) 17 MCG/ACT inhaler; INHALE 2 PUFFS 4 TIMES DAILY  Dispense: 1 each; Refill: 3 - fluticasone (FLOVENT HFA) 110 MCG/ACT inhaler; USE 2 PUFFS INHALED TWICE DAILY  Dispense: 36 each; Refill: 3 - FLUoxetine (PROZAC) 40 MG capsule; Take 1 capsule (40 mg total) by mouth daily.  Dispense: 90 capsule; Refill: 1 - montelukast (SINGULAIR) 10 MG tablet; Take 1 tablet (10 mg total) by mouth daily.  Dispense: 90 tablet; Refill: 3 - zolpidem (AMBIEN) 5 MG tablet; Take 0.5-1 tablets (2.5-5 mg total) by mouth at bedtime as needed for sleep.  Dispense: 30 tablet; Refill: 0   General Counseling: Shadia verbalizes understanding of the findings of todays visit and agrees with plan of treatment. I have discussed any further diagnostic evaluation that may be needed or ordered today. We also reviewed her medications today. she has been encouraged to call the office with any questions or concerns that should arise related to todays visit.    No orders of the defined types were placed in this encounter.   Meds ordered this encounter  Medications   ALPRAZolam (XANAX) 0.25 MG tablet    Sig: Take 1 tablet (0.25 mg total) by mouth 2 (two) times daily as needed for anxiety.    Dispense:  60 tablet     Refill:  2    F41.0 dx code   HYDROcodone-acetaminophen (NORCO/VICODIN) 5-325 MG tablet    Sig: Take 1 tablet by mouth daily as needed for severe pain.    Dispense:  30 tablet    Refill:  0    This is a decrease in dose from previous script, not an initial rx.   ipratropium (ATROVENT HFA) 17 MCG/ACT inhaler    Sig: INHALE 2 PUFFS 4 TIMES DAILY    Dispense:  1 each    Refill:  3   fluticasone (FLOVENT HFA) 110 MCG/ACT inhaler    Sig: USE 2 PUFFS INHALED TWICE DAILY    Dispense:  36 each    Refill:  3   FLUoxetine (PROZAC) 40 MG capsule    Sig: Take 1 capsule (40 mg total) by mouth daily.    Dispense:  90 capsule    Refill:  1   montelukast (SINGULAIR) 10 MG tablet    Sig: Take 1 tablet (10 mg total) by mouth daily.    Dispense:  90 tablet    Refill:  3   zolpidem (AMBIEN) 5 MG tablet    Sig: Take 0.5-1 tablets (2.5-5 mg total) by mouth at bedtime as needed for sleep.    Dispense:  30 tablet    Refill:  0    Do not fill now, patient will call pharmacy when she needs it.    Return in 3 months (on 11/08/2022) for previously scheduled, CPE, [facility] [place] PCP, anxiety med refill.   Total time spent:30 Minutes Time spent includes review of chart, medications, test results, and follow up plan with the patient.   [place] Controlled Substance Database was reviewed  by me.  This patient was seen by [name], FNP-C in collaboration with Dr. Clayborn Bigness as a part of collaborative care agreement.   Yale Golla R. Valetta Fuller, MSN, FNP-C Internal medicine

## 2022-09-28 ENCOUNTER — Encounter: Payer: Self-pay | Admitting: Nurse Practitioner

## 2022-10-20 ENCOUNTER — Ambulatory Visit: Payer: Medicare Other | Admitting: Nurse Practitioner

## 2022-10-22 ENCOUNTER — Encounter: Payer: Self-pay | Admitting: Emergency Medicine

## 2022-10-22 ENCOUNTER — Ambulatory Visit
Admission: EM | Admit: 2022-10-22 | Discharge: 2022-10-22 | Disposition: A | Discharge status: Home / Self Care | Payer: Medicare Other | Attending: Family Medicine | Admitting: Family Medicine

## 2022-10-22 DIAGNOSIS — U071 COVID-19: Secondary | ICD-10-CM | POA: Diagnosis not present

## 2022-10-22 DIAGNOSIS — J4540 Moderate persistent asthma, uncomplicated: Secondary | ICD-10-CM | POA: Diagnosis not present

## 2022-10-22 DIAGNOSIS — Z20822 Contact with and (suspected) exposure to covid-19: Secondary | ICD-10-CM | POA: Insufficient documentation

## 2022-10-22 LAB — SARS CORONAVIRUS 2 BY RT PCR: SARS Coronavirus 2 by RT PCR: POSITIVE — AB

## 2022-10-22 MED ORDER — ALBUTEROL SULFATE HFA 108 (90 BASE) MCG/ACT IN AERS: 4.0000 | INHALATION_SPRAY | RESPIRATORY_TRACT | 5 refills | Status: DC | PRN

## 2022-10-22 MED ORDER — PROMETHAZINE-DM 6.25-15 MG/5ML PO SYRP: 5.0000 mL | ORAL_SOLUTION | Freq: Four times a day (QID) | ORAL | 0 refills | Status: DC | PRN

## 2022-10-22 MED ORDER — MOLNUPIRAVIR EUA 200MG CAPSULE: 4.0000 | ORAL_CAPSULE | Freq: Two times a day (BID) | ORAL | 0 refills | Status: AC

## 2022-10-22 NOTE — Discharge Instructions (Addendum)
You were tested for COVID here.  If positive, we will send in a medication to help treat COVID at home.  Your home test for COVID-19 was positive, meaning that you were likely infected with the novel coronavirus and could give the germ to others.  Please continue isolation at home for at least 5 days since the start of your symptoms. Once you complete your 5 day quarantine, you may return to normal activities as long as you've not had a fever for over 24 hours(without taking fever reducing medicine) and your symptoms are improving. Be sure to wear a mask until Day 11.   Please continue good preventive care measures, including:  frequent hand-washing, avoid touching your face, cover coughs/sneezes, stay out of crowds and keep a 6 foot distance from others.  Go to the nearest hospital emergency room if fever/cough/breathlessness are severe or illness seems like a threat to life.

## 2022-10-22 NOTE — ED Provider Notes (Incomplete)
[facility]    CSN: [id number] Arrival date & time: 10/22/22  1412      History   Chief Complaint Chief Complaint  Patient presents with   Covid Positive    HPI Bonnie West is a 71 y.o. female.   HPI   Bonnie West presents for ***.   Fever : no  Chills: no Sore throat: no   Cough: no Sputum: no Nasal congestion : no  Rhinorrhea: no Myalgias: no Appetite: normal  Hydration: normal  Abdominal pain: no Nausea: no Vomiting: no Diarrhea: No Rash: No Sleep disturbance: no Headache: no      Past Medical History:  Diagnosis Date   Allergic rhinitis    Anxiety    Asthma    Blood transfusion without reported diagnosis    Depression    Diverticulosis    Dyspnea    Esophagitis    GERD (gastroesophageal reflux disease)    Headache    Heart murmur    Hyperlipemia    Hypertension    Hypothyroidism    Obesity    Osteopenia    Palpitations    Pneumothorax    Sleep apnea     Patient Active Problem List   Diagnosis Date Noted   Vaginal atrophy 75/91/6384   Diastolic dysfunction 66/59/9357   OSA on CPAP 07/15/2020   Tightness in chest 05/20/2020   Hypercalcemia 05/20/2020   Primary insomnia 05/20/2020   Acute non-recurrent pansinusitis 02/12/2020   Encounter for general adult medical examination with abnormal findings 11/10/2019   Gastroesophageal reflux disease without esophagitis 11/10/2019   Dysuria 11/10/2019   Acute otitis externa of left ear 07/15/2019   Irritable bowel syndrome with constipation 07/15/2019   Chronic midline low back pain without sciatica 04/27/2019   Allergic rhinitis due to pollen 04/27/2019   Essential hypertension, benign 04/27/2019   GAD (generalized anxiety disorder) 09/01/2018   Chronic migraine without aura without status migrainosus, not intractable 09/01/2018   Encounter for long-term (current) use of medications 09/01/2018   Asthma without status asthmaticus 01/30/2018   Hyperlipidemia, unspecified  01/30/2018   Hypertension 01/30/2018   Obesity, unspecified 01/30/2018   Hypothyroidism 01/30/2018   Fatty infiltration of liver 12/10/2015   Abdominal pain, LLQ (left lower quadrant) 11/03/2015   History of adenomatous polyp of colon 11/03/2015   Hallux valgus, left 09/18/2014   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   Shoulder arthritis 12/19/2013    Past Surgical History:  Procedure Laterality Date   BUNIONECTOMY     CATARACT EXTRACTION W/PHACO Right 10/11/2016   Procedure: CATARACT EXTRACTION PHACO AND INTRAOCULAR LENS PLACEMENT ([place]);  Surgeon: Birder Robson, MD;  Location: [facility] ORS;  Service: Ophthalmology;  Laterality: Right;  Lot# [id number] H [place]: 00:37.7 AP%: 18.1 CDE: 6.80   CATARACT EXTRACTION W/PHACO Left 11/08/2016   Procedure: CATARACT EXTRACTION PHACO AND INTRAOCULAR LENS PLACEMENT ([place]);  Surgeon: Birder Robson, MD;  Location: [facility] ORS;  Service: Ophthalmology;  Laterality: Left;  PACK LOT: [id number] H US:00:32 AP:44 CDE:6.46   COLONOSCOPY     COLONOSCOPY WITH PROPOFOL N/A 11/26/2015   Procedure: COLONOSCOPY WITH PROPOFOL;  Surgeon: Manya Silvas, MD;  Location: [facility] ENDOSCOPY;  Service: Endoscopy;  Laterality: N/A;   EYE SURGERY  07/23/2021   EYE SURGERY Left 09/24/2021   strabusmis, double vision   TONSILLECTOMY      OB History     Gravida  1   Para  1   Term  1   Preterm      AB  Living  1      SAB      IAB      Ectopic      Multiple      Live Births  1            Home Medications    Prior to Admission medications   Medication Sig Start Date End Date Taking? Authorizing Provider  acetaminophen (TYLENOL) 500 MG tablet Take 1,000 mg by mouth 2 (two) times daily as needed for mild pain or moderate pain.    [provider]  albuterol (VENTOLIN HFA) 108 (90 Base) MCG/ACT inhaler INHALE 2 PUFFS BY MOUTH EVERY 6 HOURS 05/09/22   [name], NP  ALPRAZolam (XANAX) 0.25 MG tablet Take 1 tablet (0.25 mg total) by  mouth 2 (two) times daily as needed for anxiety. 08/04/22   [name], NP  baclofen (LIORESAL) 10 MG tablet TAKE 1 TABLET BY MOUTH TWICE A DAY AS NEEDED FOR MUSCLE SPASMS 05/09/22   [name], NP  bisacodyl (DULCOLAX) 5 MG EC tablet Take 5-10 mg by mouth at bedtime as needed for moderate constipation. Two at night    [provider]  cetirizine (ZYRTEC) 10 MG tablet Take 10 mg by mouth daily.    [provider]  cholecalciferol (VITAMIN D) 1000 units tablet Take 1,000 Units by mouth at bedtime.    [provider]  clindamycin (CLEOCIN T) 1 % external solution APPLY 1 APPLICATION TOPICALLY 2 (TWO) TIMES DAILY AS NEEDED. FOR SKIN BREAKDOWN 12/15/21   [name], NP  cyclobenzaprine (FLEXERIL) 5 MG tablet Take 1-2 tablets (5-10 mg total) by mouth at bedtime. 05/09/22   [name], NP  docusate sodium (COLACE) 100 MG capsule Take 100-200 mg by mouth at bedtime as needed for mild constipation or moderate constipation.     [provider]  estradiol (ESTRACE) 0.1 MG/GM vaginal cream INSERT 1 GRAM VAGINALLY TWICE WEEKLY AT BEDTIME 08/20/20   Ronnell Freshwater, NP  fenofibrate (TRICOR) 145 MG tablet Take 1 tablet (145 mg total) by mouth daily. 05/09/22   [name], NP  FLUoxetine (PROZAC) 40 MG capsule Take 1 capsule (40 mg total) by mouth daily. 08/04/22   [name], NP  fluticasone (FLOVENT HFA) 110 MCG/ACT inhaler USE 2 PUFFS INHALED TWICE DAILY 08/04/22   [name], NP  HYDROcodone-acetaminophen (NORCO/VICODIN) 5-325 MG tablet Take 1 tablet by mouth daily as needed for severe pain. 08/04/22   [name], NP  ipratropium (ATROVENT HFA) 17 MCG/ACT inhaler INHALE 2 PUFFS 4 TIMES DAILY 08/04/22   [name], NP  ipratropium-albuterol (DUONEB) 0.5-2.5 (3) MG/3ML SOLN Take 3 mLs by nebulization every 4 (four) hours as needed (SOB, wheezing). 02/04/22   [name], NP   isometheptene-acetaminophen-dichloralphenazone (MIDRIN) 65-100-325 MG capsule Take 1 capsule by mouth 4 (four) times daily as needed for migraine. Maximum 5 capsules in 12 hours for migraine headaches, 8 capsules in 24 hours for tension headaches.    [provider]  levothyroxine (SYNTHROID) 100 MCG tablet TAKE 1 TABLET BY MOUTH EVERY DAY IN THE MORNING 05/09/22   [name], NP  lisinopril (ZESTRIL) 10 MG tablet Take 1 tablet (10 mg total) by mouth daily. 05/09/22   [name], NP  magnesium oxide (MAG-OX) 400 MG tablet Take 400 mg by mouth at bedtime.     [provider]  montelukast (SINGULAIR) 10 MG tablet Take 1 tablet (10 mg total) by mouth daily. 08/04/22   [name], NP  omeprazole (PRILOSEC) 40  MG capsule TAKE 1 CAPSULE BY MOUTH EVERY DAY 05/26/22   [name], NP  polyethylene glycol powder (GLYCOLAX/MIRALAX) 17 GM/SCOOP powder Take 17 g by mouth every evening. 07/15/19   Ronnell Freshwater, NP  triamcinolone cream (KENALOG) 0.1 % Apply 1 application topically 2 (two) times daily as needed. For skin breakdown    [provider]  vitamin B-12 (CYANOCOBALAMIN) 1000 MCG tablet Take 1,000 mcg by mouth daily.    [provider]  zolpidem (AMBIEN) 5 MG tablet Take 0.5-1 tablets (2.5-5 mg total) by mouth at bedtime as needed for sleep. 08/04/22   [name], NP    Family History Family History  Problem Relation Age of Onset   Breast cancer Maternal Grandmother    Stroke Maternal Grandmother    Bladder Cancer Father    Prostate cancer Father    Heart attack Father    Aortic aneurysm Father    Congestive Heart Failure Father    Colon cancer Paternal Grandmother    Aneurysm Paternal Grandmother    Stroke Paternal Grandmother    Congestive Heart Failure Paternal Grandmother    Dementia Mother    Stroke Mother    Dementia Maternal Grandfather    Stroke Maternal Grandfather    Dementia Paternal Grandfather    Stroke  Paternal Grandfather     Social History Social History   Tobacco Use   Smoking status: Former   Smokeless tobacco: Never  Scientific laboratory technician Use: Never used  Substance Use Topics   Alcohol use: No   Drug use: No     Allergies   Advair diskus [fluticasone-salmeterol], Codeine, Erythromycin, Morphine and related, Nsaids, and Sulfa antibiotics   Review of Systems Review of Systems: negative unless otherwise stated in HPI.      Physical Exam Triage Vital Signs ED Triage Vitals  Enc Vitals Group     BP 10/22/22 1445 (!) 153/85     Pulse Rate 10/22/22 1445 92     Resp 10/22/22 1445 14     Temp 10/22/22 1445 100.1 F (37.8 C)     Temp Source 10/22/22 1445 Oral     SpO2 10/22/22 1445 99 %     Weight 10/22/22 1443 145 lb 15.1 oz (66.2 kg)     Height 10/22/22 1443 '5\' 1"'$  (1.549 m)     Head Circumference --      Peak Flow --      Pain Score 10/22/22 1442 4     Pain Loc --      Pain Edu? --      Excl. in [place]? --    No data found.  Updated Vital Signs BP (!) 153/85 (BP Location: Left Arm)   Pulse 92   Temp 100.1 F (37.8 C) (Oral)   Resp 14   Ht '5\' 1"'$  (1.549 m)   Wt 66.2 kg   SpO2 99%   BMI 27.58 kg/m   Visual Acuity Right Eye Distance:   Left Eye Distance:   Bilateral Distance:    Right Eye Near:   Left Eye Near:    Bilateral Near:     Physical Exam GEN:     alert, non-toxic appearing female in no distress ***   HENT:  mucus membranes moist, oropharyngeal ***without lesions or ***exudate, no*** tonsillar hypertrophy, *** mild oropharyngeal erythema , *** moderate erythematous edematous turbinates, ***clear nasal discharge, ***bilateral TM normal EYES:   pupils equal and reactive, ***no scleral injection or discharge NECK:  normal ROM,  no ***lymphadenopathy, ***no meningismus   RESP:  no increased work of breathing, ***clear to auscultation bilaterally CVS:   regular rate ***and rhythm Skin:   warm and dry, no rash on visible skin***    UC Treatments  / Results  Labs (all labs ordered are listed, but only abnormal results are displayed) Labs Reviewed - No data to display  EKG   Radiology No results found.  Procedures Procedures (including critical care time)  Medications Ordered in UC Medications - No data to display  Initial Impression / Assessment and Plan / UC Course  I have reviewed the triage vital signs and the nursing notes.  Pertinent labs & imaging results that were available during my care of the patient were reviewed by me and considered in my medical decision making (see chart for details).       Pt is a 71 y.o. female who presents for *** days of respiratory symptoms. Zykia is ***afebrile here without recent antipyretics. Satting well on room air. Overall pt is ***non-toxic appearing, well hydrated, without respiratory distress. Pulmonary exam ***is unremarkable.  COVID and influenza testing obtained ***and was negative. ***Pt to quarantine until COVID test results or longer if positive.  I will call patient with test results, if positive. History consistent with ***viral respiratory illness. Discussed symptomatic treatment.  Explained lack of efficacy of antibiotics in viral disease.  Typical duration of symptoms discussed.   Return and ED precautions given and patient/guardian voiced understanding. Discussed MDM, treatment plan and plan for follow-up with patient/guardian*** who agrees with plan.     Final Clinical Impressions(s) / UC Diagnoses   Final diagnoses:  None   Discharge Instructions   None    ED Prescriptions   None    PDMP not reviewed this encounter.

## 2022-10-22 NOTE — ED Triage Notes (Signed)
Patient states that her husband tested positive for COVID earlier this week.  Patient states that she took a home covid test this morning and was positive.  Patient c/o cough and chest congestion that started 2 days ago.  Patient reports low grade fevers.

## 2022-10-25 ENCOUNTER — Ambulatory Visit: Payer: Medicare Other | Admitting: Obstetrics and Gynecology

## 2022-11-01 ENCOUNTER — Telehealth (INDEPENDENT_AMBULATORY_CARE_PROVIDER_SITE_OTHER): Payer: Medicare Other | Admitting: Internal Medicine

## 2022-11-01 ENCOUNTER — Encounter: Payer: Self-pay | Admitting: Internal Medicine

## 2022-11-01 VITALS — Resp 16 | Ht 61.0 in

## 2022-11-01 DIAGNOSIS — U071 COVID-19: Secondary | ICD-10-CM | POA: Diagnosis not present

## 2022-11-01 DIAGNOSIS — J4541 Moderate persistent asthma with (acute) exacerbation: Secondary | ICD-10-CM

## 2022-11-01 DIAGNOSIS — J208 Acute bronchitis due to other specified organisms: Secondary | ICD-10-CM | POA: Diagnosis not present

## 2022-11-01 MED ORDER — AZITHROMYCIN 250 MG PO TABS: ORAL_TABLET | ORAL | 0 refills | Status: DC

## 2022-11-01 MED ORDER — PREDNISONE 10 MG PO TABS: ORAL_TABLET | ORAL | 0 refills | Status: DC

## 2022-11-01 NOTE — Progress Notes (Signed)
[facility] [place], [place] [address]  Internal MEDICINE  Telephone Visit  Patient Name: Bonnie West  [id number]  [id number]  Date of Service: 11/01/2022  I connected with the patient at 225 by telephone and verified the patients identity using two identifiers.   I discussed the limitations, risks, security and privacy concerns of performing an evaluation and management service by telephone and the availability of in person appointments. I also discussed with the patient that there may be a patient responsible charge related to the service.  The patient expressed understanding and agrees to proceed.    Chief Complaint  Patient presents with   Telephone Assessment    [phone]   Telephone Screen   Covid Positive    Patient was seen at urgent care, but not feeling any better. Given promethazine, but that did not help.    HPI Patient is connected via telephone was seen in urgent care and had a diagnosis of COVID-pneumonia she was given anti-COVID/viral therapy. Patient is feeling better however now has coughing wheezing has history of asthma Denies any fever or chills    Current Medication: Outpatient Encounter Medications as of 11/01/2022  Medication Sig   acetaminophen (TYLENOL) 500 MG tablet Take 1,000 mg by mouth 2 (two) times daily as needed for mild pain or moderate pain.   albuterol (VENTOLIN HFA) 108 (90 Base) MCG/ACT inhaler Inhale 4 puffs into the lungs every 4 (four) hours as needed for wheezing or shortness of breath. INHALE 2 PUFFS BY MOUTH EVERY 6 HOURS   ALPRAZolam (XANAX) 0.25 MG tablet Take 1 tablet (0.25 mg total) by mouth 2 (two) times daily as needed for anxiety.   azithromycin (ZITHROMAX) 250 MG tablet Take one tab a day for 10 days for uri   baclofen (LIORESAL) 10 MG tablet TAKE 1 TABLET BY MOUTH TWICE A DAY AS NEEDED FOR MUSCLE SPASMS   bisacodyl (DULCOLAX) 5 MG EC tablet Take 5-10 mg by mouth at bedtime as needed for moderate  constipation. Two at night   cetirizine (ZYRTEC) 10 MG tablet Take 10 mg by mouth daily.   cholecalciferol (VITAMIN D) 1000 units tablet Take 1,000 Units by mouth at bedtime.   clindamycin (CLEOCIN T) 1 % external solution APPLY 1 APPLICATION TOPICALLY 2 (TWO) TIMES DAILY AS NEEDED. FOR SKIN BREAKDOWN   cyclobenzaprine (FLEXERIL) 5 MG tablet Take 1-2 tablets (5-10 mg total) by mouth at bedtime.   docusate sodium (COLACE) 100 MG capsule Take 100-200 mg by mouth at bedtime as needed for mild constipation or moderate constipation.    estradiol (ESTRACE) 0.1 MG/GM vaginal cream INSERT 1 GRAM VAGINALLY TWICE WEEKLY AT BEDTIME   fenofibrate (TRICOR) 145 MG tablet Take 1 tablet (145 mg total) by mouth daily.   FLUoxetine (PROZAC) 40 MG capsule Take 1 capsule (40 mg total) by mouth daily.   fluticasone (FLOVENT HFA) 110 MCG/ACT inhaler USE 2 PUFFS INHALED TWICE DAILY   HYDROcodone-acetaminophen (NORCO/VICODIN) 5-325 MG tablet Take 1 tablet by mouth daily as needed for severe pain.   ipratropium (ATROVENT HFA) 17 MCG/ACT inhaler INHALE 2 PUFFS 4 TIMES DAILY   ipratropium-albuterol (DUONEB) 0.5-2.5 (3) MG/3ML SOLN Take 3 mLs by nebulization every 4 (four) hours as needed (SOB, wheezing).   isometheptene-acetaminophen-dichloralphenazone (MIDRIN) 65-100-325 MG capsule Take 1 capsule by mouth 4 (four) times daily as needed for migraine. Maximum 5 capsules in 12 hours for migraine headaches, 8 capsules in 24 hours for tension headaches.   levothyroxine (SYNTHROID) 100 MCG tablet  TAKE 1 TABLET BY MOUTH EVERY DAY IN THE MORNING   lisinopril (ZESTRIL) 10 MG tablet Take 1 tablet (10 mg total) by mouth daily.   magnesium oxide (MAG-OX) 400 MG tablet Take 400 mg by mouth at bedtime.    montelukast (SINGULAIR) 10 MG tablet Take 1 tablet (10 mg total) by mouth daily.   omeprazole (PRILOSEC) 40 MG capsule TAKE 1 CAPSULE BY MOUTH EVERY DAY   polyethylene glycol powder (GLYCOLAX/MIRALAX) 17 GM/SCOOP powder Take 17 g by  mouth every evening.   predniSONE (DELTASONE) 10 MG tablet Take one tab 3 x day for 3 days, then take one tab 2 x a day for 3 days and then take one tab a day for 3 days for copd   promethazine-dextromethorphan (PROMETHAZINE-DM) 6.25-15 MG/5ML syrup Take 5 mLs by mouth 4 (four) times daily as needed.   triamcinolone cream (KENALOG) 0.1 % Apply 1 application topically 2 (two) times daily as needed. For skin breakdown   vitamin B-12 (CYANOCOBALAMIN) 1000 MCG tablet Take 1,000 mcg by mouth daily.   zolpidem (AMBIEN) 5 MG tablet Take 0.5-1 tablets (2.5-5 mg total) by mouth at bedtime as needed for sleep.   No facility-administered encounter medications on file as of 11/01/2022.    Surgical History: Past Surgical History:  Procedure Laterality Date   BUNIONECTOMY     CATARACT EXTRACTION W/PHACO Right 10/11/2016   Procedure: CATARACT EXTRACTION PHACO AND INTRAOCULAR LENS PLACEMENT ([place]);  Surgeon: Birder Robson, MD;  Location: [facility] ORS;  Service: Ophthalmology;  Laterality: Right;  Lot# [id number] H [place]: 00:37.7 AP%: 18.1 CDE: 6.80   CATARACT EXTRACTION W/PHACO Left 11/08/2016   Procedure: CATARACT EXTRACTION PHACO AND INTRAOCULAR LENS PLACEMENT ([place]);  Surgeon: Birder Robson, MD;  Location: [facility] ORS;  Service: Ophthalmology;  Laterality: Left;  PACK LOT: [id number] H US:00:32 AP:44 CDE:6.46   COLONOSCOPY     COLONOSCOPY WITH PROPOFOL N/A 11/26/2015   Procedure: COLONOSCOPY WITH PROPOFOL;  Surgeon: Manya Silvas, MD;  [facility] END[facility];  Service: Endoscopy;  Laterality: N/A;   EYE SURGERY  07/23/2021   EYE SURGERY Left 09/24/2021   strabusmis, double vision   TONSILLECTOMY      Medical History: Past Medical History:  Diagnosis Date   Allergic rhinitis    Anxiety    Asthma    Blood transfusion without reported diagnosis    Depression    Diverticulosis    Dyspnea    Esophagitis    GERD (gastroesophageal reflux disease)    Headache    Heart murmur    Hyperlipemia     Hypertension    Hypothyroidism    Obesity    Osteopenia    Palpitations    Pneumothorax    Sleep apnea     Family History: Family History  Problem Relation Age of Onset   Breast cancer Maternal Grandmother    Stroke Maternal Grandmother    Bladder Cancer Father    Prostate cancer Father    Heart attack Father    Aortic aneurysm Father    Congestive Heart Failure Father    Colon cancer Paternal Grandmother    Aneurysm Paternal Grandmother    Stroke Paternal Grandmother    Congestive Heart Failure Paternal Grandmother    Dementia Mother    Stroke Mother    Dementia Maternal Grandfather    Stroke Maternal Grandfather    Dementia Paternal Grandfather    Stroke Paternal Grandfather     Social History   Socioeconomic History   Marital status: Married  Spouse name: Not on file   Number of children: Not on file   Years of education: Not on file   Highest education level: Not on file  Occupational History   Not on file  Tobacco Use   Smoking status: Former   Smokeless tobacco: Never  Vaping Use   Vaping Use: Never used  Substance and Sexual Activity   Alcohol use: No   Drug use: No   Sexual activity: Not Currently    Partners: Male    Birth control/protection: Post-menopausal  Other Topics Concern   Not on file  Social History Narrative   Not on file   Social Determinants of Health   Financial Resource Strain: Low Risk  (03/23/2021)   Overall Financial Resource Strain (CARDIA)    Difficulty of Paying Living Expenses: Not very hard  Food Insecurity: Not on file  Transportation Needs: Not on file  Physical Activity: Not on file  Stress: Not on file  Social Connections: Not on file  Intimate Partner Violence: Not on file      Review of Systems  Constitutional:  Negative for fatigue and fever.  HENT:  Negative for congestion, mouth sores and postnasal drip.   Respiratory:  Positive for cough and shortness of breath.   Cardiovascular:  Negative for chest  pain.  Genitourinary:  Negative for flank pain.  Psychiatric/Behavioral: Negative.      Vital Signs: Resp 16   Ht '5\' 1"'$  (1.549 m)   BMI 27.58 kg/m    Observation/Objective: Patient is coughing over the phone however was able to carry a conversation    Assessment/Plan: 1. Acute bronchitis due to COVID-19 virus Patient has been treated appropriately with Molnupiravir, might need CXR if no improvement seen    2. Moderate persistent asthma without status asthmaticus with acute exacerbation Pt is instructed to take all meds as prescribed including her inhalers.   - predniSONE (DELTASONE) 10 MG tablet; Take one tab 3 x day for 3 days, then take one tab 2 x a day for 3 days and then take one tab a day for 3 days for copd  Dispense: 18 tablet; Refill: 0 - azithromycin (ZITHROMAX) 250 MG tablet; Take one tab a day for 10 days for uri  Dispense: 10 tablet; Refill: 0   General Counseling: Sandy Salaam understanding of the findings of today's phone visit and agrees with plan of treatment. I have discussed any further diagnostic evaluation that may be needed or ordered today. We also reviewed her medications today. she has been encouraged to call the office with any questions or concerns that should arise related to todays visit.    No orders of the defined types were placed in this encounter.   Meds ordered this encounter  Medications   predniSONE (DELTASONE) 10 MG tablet    Sig: Take one tab 3 x day for 3 days, then take one tab 2 x a day for 3 days and then take one tab a day for 3 days for copd    Dispense:  18 tablet    Refill:  0   azithromycin (ZITHROMAX) 250 MG tablet    Sig: Take one tab a day for 10 days for uri    Dispense:  10 tablet    Refill:  0    Time spent:15 Minutes    Dr Lavera Guise Internal medicine

## 2022-11-08 ENCOUNTER — Ambulatory Visit (INDEPENDENT_AMBULATORY_CARE_PROVIDER_SITE_OTHER): Payer: Medicare Other | Admitting: Nurse Practitioner

## 2022-11-08 ENCOUNTER — Encounter: Payer: Self-pay | Admitting: Nurse Practitioner

## 2022-11-08 VITALS — BP 137/75 | HR 74 | Temp 97.8°F | Resp 16 | Ht 61.0 in | Wt 146.0 lb

## 2022-11-08 DIAGNOSIS — E782 Mixed hyperlipidemia: Secondary | ICD-10-CM

## 2022-11-08 DIAGNOSIS — Z760 Encounter for issue of repeat prescription: Secondary | ICD-10-CM | POA: Diagnosis not present

## 2022-11-08 DIAGNOSIS — Z0001 Encounter for general adult medical examination with abnormal findings: Secondary | ICD-10-CM | POA: Diagnosis not present

## 2022-11-08 DIAGNOSIS — R300 Dysuria: Secondary | ICD-10-CM

## 2022-11-08 DIAGNOSIS — E039 Hypothyroidism, unspecified: Secondary | ICD-10-CM | POA: Diagnosis not present

## 2022-11-08 MED ORDER — FLUTICASONE PROPIONATE HFA 110 MCG/ACT IN AERO: INHALATION_SPRAY | RESPIRATORY_TRACT | 3 refills | Status: DC

## 2022-11-08 MED ORDER — ZOLPIDEM TARTRATE 5 MG PO TABS: 2.5000 mg | ORAL_TABLET | Freq: Every evening | ORAL | 0 refills | Status: DC | PRN

## 2022-11-08 MED ORDER — CLINDAMYCIN PHOSPHATE 1 % EX SOLN: 1.0000 | Freq: Two times a day (BID) | CUTANEOUS | 2 refills | Status: DC | PRN

## 2022-11-08 MED ORDER — ATROVENT HFA 17 MCG/ACT IN AERS: INHALATION_SPRAY | RESPIRATORY_TRACT | 3 refills | Status: DC

## 2022-11-08 MED ORDER — ALPRAZOLAM 0.25 MG PO TABS: 0.2500 mg | ORAL_TABLET | Freq: Two times a day (BID) | ORAL | 2 refills | Status: DC | PRN

## 2022-11-08 MED ORDER — IPRATROPIUM-ALBUTEROL 0.5-2.5 (3) MG/3ML IN SOLN: 3.0000 mL | RESPIRATORY_TRACT | 2 refills | Status: DC | PRN

## 2022-11-08 MED ORDER — HYDROCODONE-ACETAMINOPHEN 5-325 MG PO TABS: 1.0000 | ORAL_TABLET | Freq: Every day | ORAL | 0 refills | Status: DC | PRN

## 2022-11-08 MED ORDER — FENOFIBRATE 145 MG PO TABS: 145.0000 mg | ORAL_TABLET | Freq: Every day | ORAL | 1 refills | Status: DC

## 2022-11-08 MED ORDER — FLUOXETINE HCL 40 MG PO CAPS: 40.0000 mg | ORAL_CAPSULE | Freq: Every day | ORAL | 1 refills | Status: DC

## 2022-11-08 MED ORDER — BACLOFEN 10 MG PO TABS: ORAL_TABLET | ORAL | 1 refills | Status: DC

## 2022-11-08 NOTE — Progress Notes (Signed)
[facility] [place],  [id number]  Internal MEDICINE  Office Visit Note  Patient Name: Bonnie West  [id number]  [id number]  Date of Service: 11/08/2022  Chief Complaint  Patient presents with   Medicare Wellness   Depression   Hyperlipidemia   Hypertension   Gastroesophageal Reflux    HPI Bonnie West presents for an annual well visit and physical exam.  Well-appearing 71 y.o. female/female with hypertension, migraines, asthma, OSA, IBS, GERd, hypothyroidism, osteoarthritis, fatty liver, and GAD.  Routine CRC screening: due in 2027 Routine mammogram: done in February this year, due in 2 months.  DEXA scan: done in 2018 Labs: labs done in march this year New or worsening pain: none Other concerns: none       11/08/2022   11:07 AM 11/04/2021   11:29 AM 11/05/2020    2:11 PM  MMSE - Mini Mental State Exam  Orientation to time _0 Orientation to Place _1 Registration _2 Attention/ Calculation _3 Recall _4 Language- name 2 objects _5 Language- repeat _6 Language- follow 3 step command _7 Language- read & follow direction _8 Write a sentence _9 Copy design _10 Total score _11 Functional Status Survey: Is the patient deaf or have difficulty hearing?: Yes Does the patient have difficulty seeing, even when wearing glasses/contacts?: No Does the patient have difficulty concentrating, remembering, or making decisions?: No Does the patient have difficulty walking or climbing stairs?: No Does the patient have difficulty dressing or bathing?: No Does the patient have difficulty doing errands alone such as visiting a doctor's office or shopping?: No     04/02/2022   12:03 PM 05/09/2022    1:45 PM 08/04/2022    1:22 PM 10/22/2022    2:44 PM 11/08/2022   11:05 AM  [place] in the past year?  _12 Was there an injury with Fall?  1 1  0  Fall Risk Category Calculator  _13 Fall Risk  Category  High High  Moderate  Patient Fall Risk Level Moderate fall risk  High fall risk Low fall risk Moderate fall risk  Patient at Risk for Falls Due to   Impaired balance/gait    Fall risk Follow up     Falls evaluation completed       08/04/2022    1:22 PM  Depression screen PHQ 2/9  Decreased Interest 0  Down, Depressed, Hopeless 0  PHQ - 2 Score 0       Current Medication: Outpatient Encounter Medications as of 11/08/2022  Medication Sig   acetaminophen (TYLENOL) 500 MG tablet Take 1,000 mg by mouth 2 (two) times daily as needed for mild pain or moderate pain.   albuterol (VENTOLIN HFA) 108 (90 Base) MCG/ACT inhaler Inhale 4 puffs into the lungs every 4 (four) hours as needed for wheezing or shortness of breath. INHALE 2 PUFFS BY MOUTH EVERY 6 HOURS   azithromycin (ZITHROMAX) 250 MG tablet Take one tab a day for 10 days for uri   bisacodyl (DULCOLAX) 5 MG EC tablet Take 5-10 mg by mouth at bedtime as needed for moderate constipation. Two at night   cetirizine (ZYRTEC) 10 MG tablet Take 10 mg by mouth daily.   cholecalciferol (VITAMIN D) 1000  units tablet Take 1,000 Units by mouth at bedtime.   cyclobenzaprine (FLEXERIL) 5 MG tablet Take 1-2 tablets (5-10 mg total) by mouth at bedtime.   docusate sodium (COLACE) 100 MG capsule Take 100-200 mg by mouth at bedtime as needed for mild constipation or moderate constipation.    estradiol (ESTRACE) 0.1 MG/GM vaginal cream INSERT 1 GRAM VAGINALLY TWICE WEEKLY AT BEDTIME   isometheptene-acetaminophen-dichloralphenazone (MIDRIN) 65-100-325 MG capsule Take 1 capsule by mouth 4 (four) times daily as needed for migraine. Maximum 5 capsules in 12 hours for migraine headaches, 8 capsules in 24 hours for tension headaches.   levothyroxine (SYNTHROID) 100 MCG tablet TAKE 1 TABLET BY MOUTH EVERY DAY IN THE MORNING   lisinopril (ZESTRIL) 10 MG tablet Take 1 tablet (10 mg total) by mouth daily.   magnesium oxide (MAG-OX) 400 MG tablet Take 400 mg  by mouth at bedtime.    montelukast (SINGULAIR) 10 MG tablet Take 1 tablet (10 mg total) by mouth daily.   omeprazole (PRILOSEC) 40 MG capsule TAKE 1 CAPSULE BY MOUTH EVERY DAY   polyethylene glycol powder (GLYCOLAX/MIRALAX) 17 GM/SCOOP powder Take 17 g by mouth every evening.   predniSONE (DELTASONE) 10 MG tablet Take one tab 3 x day for 3 days, then take one tab 2 x a day for 3 days and then take one tab a day for 3 days for copd   promethazine-dextromethorphan (PROMETHAZINE-DM) 6.25-15 MG/5ML syrup Take 5 mLs by mouth 4 (four) times daily as needed.   triamcinolone cream (KENALOG) 0.1 % Apply 1 application topically 2 (two) times daily as needed. For skin breakdown   vitamin B-12 (CYANOCOBALAMIN) 1000 MCG tablet Take 1,000 mcg by mouth daily.   [DISCONTINUED] ALPRAZolam (XANAX) 0.25 MG tablet Take 1 tablet (0.25 mg total) by mouth 2 (two) times daily as needed for anxiety.   [DISCONTINUED] baclofen (LIORESAL) 10 MG tablet TAKE 1 TABLET BY MOUTH TWICE A DAY AS NEEDED FOR MUSCLE SPASMS   [DISCONTINUED] clindamycin (CLEOCIN T) 1 % external solution APPLY 1 APPLICATION TOPICALLY 2 (TWO) TIMES DAILY AS NEEDED. FOR SKIN BREAKDOWN   [DISCONTINUED] fenofibrate (TRICOR) 145 MG tablet Take 1 tablet (145 mg total) by mouth daily.   [DISCONTINUED] FLUoxetine (PROZAC) 40 MG capsule Take 1 capsule (40 mg total) by mouth daily.   [DISCONTINUED] fluticasone (FLOVENT HFA) 110 MCG/ACT inhaler USE 2 PUFFS INHALED TWICE DAILY   [DISCONTINUED] HYDROcodone-acetaminophen (NORCO/VICODIN) 5-325 MG tablet Take 1 tablet by mouth daily as needed for severe pain.   [DISCONTINUED] ipratropium (ATROVENT HFA) 17 MCG/ACT inhaler INHALE 2 PUFFS 4 TIMES DAILY   [DISCONTINUED] ipratropium-albuterol (DUONEB) 0.5-2.5 (3) MG/3ML SOLN Take 3 mLs by nebulization every 4 (four) hours as needed (SOB, wheezing).   [DISCONTINUED] zolpidem (AMBIEN) 5 MG tablet Take 0.5-1 tablets (2.5-5 mg total) by mouth at bedtime as needed for sleep.    ALPRAZolam (XANAX) 0.25 MG tablet Take 1 tablet (0.25 mg total) by mouth 2 (two) times daily as needed for anxiety.   baclofen (LIORESAL) 10 MG tablet TAKE 1 TABLET BY MOUTH TWICE A DAY AS NEEDED FOR MUSCLE SPASMS   clindamycin (CLEOCIN T) 1 % external solution Apply 1 Application topically 2 (two) times daily as needed. For skin breakdown   fenofibrate (TRICOR) 145 MG tablet Take 1 tablet (145 mg total) by mouth daily.   FLUoxetine (PROZAC) 40 MG capsule Take 1 capsule (40 mg total) by mouth daily.   fluticasone (FLOVENT HFA) 110 MCG/ACT inhaler USE 2 PUFFS INHALED TWICE DAILY   HYDROcodone-acetaminophen (  NORCO/VICODIN) 5-325 MG tablet Take 1 tablet by mouth daily as needed for severe pain.   ipratropium (ATROVENT HFA) 17 MCG/ACT inhaler INHALE 2 PUFFS 4 TIMES DAILY   ipratropium-albuterol (DUONEB) 0.5-2.5 (3) MG/3ML SOLN Take 3 mLs by nebulization every 4 (four) hours as needed (SOB, wheezing).   zolpidem (AMBIEN) 5 MG tablet Take 0.5-1 tablets (2.5-5 mg total) by mouth at bedtime as needed for sleep.   No facility-administered encounter medications on file as of 11/08/2022.    Surgical History: Past Surgical History:  Procedure Laterality Date   BUNIONECTOMY     CATARACT EXTRACTION W/PHACO Right 10/11/2016   Procedure: CATARACT EXTRACTION PHACO AND INTRAOCULAR LENS PLACEMENT (IOC);  Surgeon: Birder Robson, MD;  Location: [facility] ORS;  Service: Ophthalmology;  Laterality: Right;  Lot# [id number] H [place]: 00:37.7 AP%: 18.1 CDE: 6.80   CATARACT EXTRACTION W/PHACO Left 11/08/2016   Procedure: CATARACT EXTRACTION PHACO AND INTRAOCULAR LENS PLACEMENT ([place]);  Surgeon: Birder Robson, MD;  Location: [facility] ORS;  Service: Ophthalmology;  Laterality: Left;  PACK LOT: [id number] H US:00:32 AP:44 CDE:6.46   COLONOSCOPY     COLONOSCOPY WITH PROPOFOL N/A 11/26/2015   Procedure: COLONOSCOPY WITH PROPOFOL;  Surgeon: Manya Silvas, MD;  Location: [facility] ENDOSCOPY;  Service: Endoscopy;  Laterality: N/A;   EYE  SURGERY  07/23/2021   EYE SURGERY Left 09/24/2021   strabusmis, double vision   TONSILLECTOMY      Medical History: Past Medical History:  Diagnosis Date   Allergic rhinitis    Anxiety    Asthma    Blood transfusion without reported diagnosis    Depression    Diverticulosis    Dyspnea    Esophagitis    GERD (gastroesophageal reflux disease)    Headache    Heart murmur    Hyperlipemia    Hypertension    Hypothyroidism    Obesity    Osteopenia    Palpitations    Pneumothorax    Sleep apnea     Family History: Family History  Problem Relation Age of Onset   Breast cancer Maternal Grandmother    Stroke Maternal Grandmother    Bladder Cancer Father    Prostate cancer Father    Heart attack Father    Aortic aneurysm Father    Congestive Heart Failure Father    Colon cancer Paternal Grandmother    Aneurysm Paternal Grandmother    Stroke Paternal Grandmother    Congestive Heart Failure Paternal Grandmother    Dementia Mother    Stroke Mother    Dementia Maternal Grandfather    Stroke Maternal Grandfather    Dementia Paternal Grandfather    Stroke Paternal Grandfather     Social History   Socioeconomic History   Marital status: Married    Spouse name: Not on file   Number of children: Not on file   Years of education: Not on file   Highest education level: Not on file  Occupational History   Not on file  Tobacco Use   Smoking status: Former   Smokeless tobacco: Never  Vaping Use   Vaping Use: Never used  Substance and Sexual Activity   Alcohol use: No   Drug use: No   Sexual activity: Not Currently    Partners: Male    Birth control/protection: Post-menopausal  Other Topics Concern   Not on file  Social History Narrative   Not on file   Social Determinants of Health   Financial Resource Strain: Low Risk  (03/23/2021)   Overall Emergency planning/management officer Strain (  CARDIA)    Difficulty of Paying Living Expenses: Not very hard  Food Insecurity: Not on file   Transportation Needs: Not on file  Physical Activity: Not on file  Stress: Not on file  Social Connections: Not on file  Intimate Partner Violence: Not on file      Review of Systems  Constitutional:  Negative for activity change, appetite change, chills, fatigue, fever and unexpected weight change.  HENT: Negative.  Negative for congestion, ear pain, rhinorrhea, sore throat and trouble swallowing.   Eyes: Negative.   Respiratory: Negative.  Negative for cough, chest tightness, shortness of breath and wheezing.   Cardiovascular: Negative.  Negative for chest pain.  Gastrointestinal: Negative.  Negative for abdominal pain, blood in stool, constipation, diarrhea, nausea and vomiting.  Endocrine: Negative.   Genitourinary: Negative.  Negative for difficulty urinating, dysuria, frequency, hematuria and urgency.  Musculoskeletal: Negative.  Negative for arthralgias, back pain, joint swelling, myalgias and neck pain.  Skin: Negative.  Negative for rash and wound.  Allergic/Immunologic: Negative.  Negative for immunocompromised state.  Neurological: Negative.  Negative for dizziness, seizures, numbness and headaches.  Hematological: Negative.   Psychiatric/Behavioral: Negative.  Negative for behavioral problems, self-injury and suicidal ideas. The patient is not nervous/anxious.     Vital Signs: BP 137/75   Pulse 74   Temp 97.8 F (36.6 C)   Resp 16   Ht _0  (1.549 m)   Wt 146 lb (66.2 kg)   SpO2 96%   BMI 27.59 kg/m    Physical Exam Vitals reviewed.  Constitutional:      General: She is not in acute distress.    Appearance: Normal appearance. She is well-developed and normal weight. She is not ill-appearing or diaphoretic.  HENT:     Head: Normocephalic and atraumatic.     Right Ear: Tympanic membrane, ear canal and external ear normal.     Left Ear: Tympanic membrane, ear canal and external ear normal.     Nose: Nose normal. No congestion or rhinorrhea.      Mouth/Throat:     Mouth: Mucous membranes are moist.     Pharynx: Oropharynx is clear. No oropharyngeal exudate or posterior oropharyngeal erythema.  Eyes:     General: No scleral icterus.       Right eye: No discharge.        Left eye: No discharge.     Extraocular Movements: Extraocular movements intact.     Conjunctiva/sclera: Conjunctivae normal.     Pupils: Pupils are equal, round, and reactive to light.  Neck:     Thyroid: No thyromegaly.     Vascular: No JVD.     Trachea: No tracheal deviation.  Cardiovascular:     Rate and Rhythm: Normal rate and regular rhythm.     Pulses: Normal pulses.     Heart sounds: Normal heart sounds. No murmur heard.    No friction rub. No gallop.  Pulmonary:     Effort: Pulmonary effort is normal. No respiratory distress.     Breath sounds: Normal breath sounds. No stridor. No wheezing or rales.  Chest:     Chest wall: No tenderness.     Comments: Declined clinical breast exam, routine mammogram ordered.  Abdominal:     General: Bowel sounds are normal. There is no distension.     Palpations: Abdomen is soft. There is no mass.     Tenderness: There is no abdominal tenderness. There is no guarding or rebound.  Musculoskeletal:  General: No tenderness or deformity. Normal range of motion.     Cervical back: Normal range of motion and neck supple.  Lymphadenopathy:     Cervical: No cervical adenopathy.  Skin:    General: Skin is warm and dry.     Coloration: Skin is not pale.     Findings: No erythema or rash.  Neurological:     Mental Status: She is alert and oriented to person, place, and time.     Cranial Nerves: No cranial nerve deficit.     Motor: No abnormal muscle tone.     Coordination: Coordination normal.     Gait: Gait normal.     Deep Tendon Reflexes: Reflexes are normal and symmetric.  Psychiatric:        Mood and Affect: Mood normal.        Behavior: Behavior normal.        Thought Content: Thought content normal.         Judgment: Judgment normal.        Assessment/Plan: 1. Encounter for general adult medical examination with abnormal findings Age-appropriate preventive screenings and vaccinations discussed, annual physical exam completed. Routine labs for health maintenance ordered, see below. PHM updated.  - TSH + free T4 - CBC with Differential/Platelet - Lipid Profile - CMP14+EGFR  2. Acquired hypothyroidism Routine labs ordered - TSH + free T4 - CBC with Differential/Platelet - CMP14+EGFR  3. Mixed hyperlipidemia Routine labs ordered - Lipid Profile - CMP14+EGFR  4. Dysuria Routine urinalysis done  - UA/M w/rflx Culture, Routine  5. Medication refill - clindamycin (CLEOCIN T) 1 % external solution; Apply 1 Application topically 2 (two) times daily as needed. For skin breakdown  Dispense: 30 mL; Refill: 2 - ALPRAZolam (XANAX) 0.25 MG tablet; Take 1 tablet (0.25 mg total) by mouth 2 (two) times daily as needed for anxiety.  Dispense: 60 tablet; Refill: 2 - zolpidem (AMBIEN) 5 MG tablet; Take 0.5-1 tablets (2.5-5 mg total) by mouth at bedtime as needed for sleep.  Dispense: 30 tablet; Refill: 0 - HYDROcodone-acetaminophen (NORCO/VICODIN) 5-325 MG tablet; Take 1 tablet by mouth daily as needed for severe pain.  Dispense: 30 tablet; Refill: 0 - baclofen (LIORESAL) 10 MG tablet; TAKE 1 TABLET BY MOUTH TWICE A DAY AS NEEDED FOR MUSCLE SPASMS  Dispense: 180 tablet; Refill: 1 - fenofibrate (TRICOR) 145 MG tablet; Take 1 tablet (145 mg total) by mouth daily.  Dispense: 90 tablet; Refill: 1 - FLUoxetine (PROZAC) 40 MG capsule; Take 1 capsule (40 mg total) by mouth daily.  Dispense: 90 capsule; Refill: 1 - fluticasone (FLOVENT HFA) 110 MCG/ACT inhaler; USE 2 PUFFS INHALED TWICE DAILY  Dispense: 36 each; Refill: 3 - ipratropium-albuterol (DUONEB) 0.5-2.5 (3) MG/3ML SOLN; Take 3 mLs by nebulization every 4 (four) hours as needed (SOB, wheezing).  Dispense: 360 mL; Refill: 2 - ipratropium (ATROVENT  HFA) 17 MCG/ACT inhaler; INHALE 2 PUFFS 4 TIMES DAILY  Dispense: 1 each; Refill: 3      General Counseling: Kilea verbalizes understanding of the findings of todays visit and agrees with plan of treatment. I have discussed any further diagnostic evaluation that may be needed or ordered today. We also reviewed her medications today. she has been encouraged to call the office with any questions or concerns that should arise related to todays visit.    Orders Placed This Encounter  Procedures   TSH + free T4   CBC with Differential/Platelet   Lipid Profile   CMP14+EGFR   UA/M w/rflx Culture,  Routine    Meds ordered this encounter  Medications   clindamycin (CLEOCIN T) 1 % external solution    Sig: Apply 1 Application topically 2 (two) times daily as needed. For skin breakdown    Dispense:  30 mL    Refill:  2   ALPRAZolam (XANAX) 0.25 MG tablet    Sig: Take 1 tablet (0.25 mg total) by mouth 2 (two) times daily as needed for anxiety.    Dispense:  60 tablet    Refill:  2    F41.0 dx code   zolpidem (AMBIEN) 5 MG tablet    Sig: Take 0.5-1 tablets (2.5-5 mg total) by mouth at bedtime as needed for sleep.    Dispense:  30 tablet    Refill:  0    Do not fill now, patient will call pharmacy when she needs it.   HYDROcodone-acetaminophen (NORCO/VICODIN) 5-325 MG tablet    Sig: Take 1 tablet by mouth daily as needed for severe pain.    Dispense:  30 tablet    Refill:  0    This is a decrease in dose from previous script, not an initial rx.   baclofen (LIORESAL) 10 MG tablet    Sig: TAKE 1 TABLET BY MOUTH TWICE A DAY AS NEEDED FOR MUSCLE SPASMS    Dispense:  180 tablet    Refill:  1   fenofibrate (TRICOR) 145 MG tablet    Sig: Take 1 tablet (145 mg total) by mouth daily.    Dispense:  90 tablet    Refill:  1   FLUoxetine (PROZAC) 40 MG capsule    Sig: Take 1 capsule (40 mg total) by mouth daily.    Dispense:  90 capsule    Refill:  1   fluticasone (FLOVENT HFA) 110 MCG/ACT  inhaler    Sig: USE 2 PUFFS INHALED TWICE DAILY    Dispense:  36 each    Refill:  3   ipratropium-albuterol (DUONEB) 0.5-2.5 (3) MG/3ML SOLN    Sig: Take 3 mLs by nebulization every 4 (four) hours as needed (SOB, wheezing).    Dispense:  360 mL    Refill:  2   ipratropium (ATROVENT HFA) 17 MCG/ACT inhaler    Sig: INHALE 2 PUFFS 4 TIMES DAILY    Dispense:  1 each    Refill:  3    Return in about 12 weeks (around 01/31/2023) for F/U, med refill, Jaimere Feutz PCP.   Total time spent:30 Minutes Time spent includes review of chart, medications, test results, and follow up plan with the patient.   [place] Controlled Substance Database was reviewed by me.  This patient was seen by [name], FNP-C in collaboration with Dr. Clayborn Bigness as a part of collaborative care agreement.  Evalyne Cortopassi R. Valetta Fuller, MSN, FNP-C Internal medicine

## 2022-11-11 LAB — UA/M W/RFLX CULTURE, ROUTINE
Bilirubin, UA: NEGATIVE
Glucose, UA: NEGATIVE
Ketones, UA: NEGATIVE
Nitrite, UA: NEGATIVE
Protein,UA: NEGATIVE
RBC, UA: NEGATIVE
Specific Gravity, UA: 1.018 (ref 1.005–1.030)
Urobilinogen, Ur: 0.2 mg/dL (ref 0.2–1.0)
pH, UA: 5.5 (ref 5.0–7.5)

## 2022-11-11 LAB — URINE CULTURE, REFLEX: Organism ID, Bacteria: NO GROWTH

## 2022-11-11 LAB — MICROSCOPIC EXAMINATION
Casts: NONE SEEN /lpf
RBC, Urine: NONE SEEN /hpf (ref 0–2)

## 2022-11-18 ENCOUNTER — Telehealth: Payer: Self-pay

## 2022-11-18 ENCOUNTER — Other Ambulatory Visit: Payer: Self-pay

## 2022-11-18 DIAGNOSIS — J4541 Moderate persistent asthma with (acute) exacerbation: Secondary | ICD-10-CM

## 2022-11-18 MED ORDER — PREDNISONE 10 MG PO TABS: ORAL_TABLET | ORAL | 0 refills | Status: DC

## 2022-11-18 NOTE — Telephone Encounter (Signed)
Pt called that she need prednisone she is coughing as per alyssa send prednisone

## 2023-01-16 ENCOUNTER — Other Ambulatory Visit: Payer: Self-pay

## 2023-01-16 DIAGNOSIS — R0602 Shortness of breath: Secondary | ICD-10-CM

## 2023-01-25 ENCOUNTER — Ambulatory Visit (INDEPENDENT_AMBULATORY_CARE_PROVIDER_SITE_OTHER): Payer: Medicare Other | Admitting: Internal Medicine

## 2023-01-25 DIAGNOSIS — R0602 Shortness of breath: Secondary | ICD-10-CM

## 2023-01-30 ENCOUNTER — Encounter: Payer: Self-pay | Admitting: Nurse Practitioner

## 2023-01-30 ENCOUNTER — Ambulatory Visit (INDEPENDENT_AMBULATORY_CARE_PROVIDER_SITE_OTHER): Payer: Medicare Other | Admitting: Nurse Practitioner

## 2023-01-30 VITALS — BP 127/71 | HR 70 | Temp 97.1°F | Resp 16 | Ht 61.0 in | Wt 144.2 lb

## 2023-01-30 DIAGNOSIS — I10 Essential (primary) hypertension: Secondary | ICD-10-CM

## 2023-01-30 DIAGNOSIS — M545 Low back pain, unspecified: Secondary | ICD-10-CM | POA: Diagnosis not present

## 2023-01-30 DIAGNOSIS — G8929 Other chronic pain: Secondary | ICD-10-CM | POA: Diagnosis not present

## 2023-01-30 DIAGNOSIS — F411 Generalized anxiety disorder: Secondary | ICD-10-CM | POA: Diagnosis not present

## 2023-01-30 DIAGNOSIS — E039 Hypothyroidism, unspecified: Secondary | ICD-10-CM | POA: Diagnosis not present

## 2023-01-30 MED ORDER — LEVOTHYROXINE SODIUM 100 MCG PO TABS: ORAL_TABLET | ORAL | 3 refills | Status: DC

## 2023-01-30 MED ORDER — ALPRAZOLAM 0.25 MG PO TABS: 0.2500 mg | ORAL_TABLET | Freq: Two times a day (BID) | ORAL | 2 refills | Status: DC | PRN

## 2023-01-30 MED ORDER — HYDROCODONE-ACETAMINOPHEN 5-325 MG PO TABS: 1.0000 | ORAL_TABLET | Freq: Every day | ORAL | 0 refills | Status: DC | PRN

## 2023-01-30 MED ORDER — LISINOPRIL 10 MG PO TABS: 10.0000 mg | ORAL_TABLET | Freq: Every day | ORAL | 3 refills | Status: DC

## 2023-01-30 MED ORDER — CYCLOBENZAPRINE HCL 5 MG PO TABS: 5.0000 mg | ORAL_TABLET | Freq: Every day | ORAL | 2 refills | Status: AC

## 2023-01-30 NOTE — Progress Notes (Signed)
[facility] [place], [place] [address]  Internal MEDICINE  Office Visit Note  Patient Name: Bonnie West  [id number]  [id number]  Date of Service: 01/30/2023  Chief Complaint  Patient presents with   Follow-up    Med refill.     HPI Francess presents for a follow up visit for anxiety, hypertension, chronic pain and hypothyroidism.  Hypothyroidism -- takes levothyroxine Hypertension -- takes lisinopril, well controlled.  Anxiety -- takes alprazolam.  Chronic pain -- takes prn hydrocodone and cyclobenzaprine.  Has been 7 years since colonoscopy. Has left side pain that is residual from fall last year when she bruised her left side ribs.     Current Medication: Outpatient Encounter Medications as of 01/30/2023  Medication Sig   acetaminophen (TYLENOL) 500 MG tablet Take 1,000 mg by mouth 2 (two) times daily as needed for mild pain or moderate pain.   albuterol (VENTOLIN HFA) 108 (90 Base) MCG/ACT inhaler Inhale 4 puffs into the lungs every 4 (four) hours as needed for wheezing or shortness of breath. INHALE 2 PUFFS BY MOUTH EVERY 6 HOURS   baclofen (LIORESAL) 10 MG tablet TAKE 1 TABLET BY MOUTH TWICE A DAY AS NEEDED FOR MUSCLE SPASMS   bisacodyl (DULCOLAX) 5 MG EC tablet Take 5-10 mg by mouth at bedtime as needed for moderate constipation. Two at night   cetirizine (ZYRTEC) 10 MG tablet Take 10 mg by mouth daily.   cholecalciferol (VITAMIN D) 1000 units tablet Take 1,000 Units by mouth at bedtime.   clindamycin (CLEOCIN T) 1 % external solution Apply 1 Application topically 2 (two) times daily as needed. For skin breakdown   docusate sodium (COLACE) 100 MG capsule Take 100-200 mg by mouth at bedtime as needed for mild constipation or moderate constipation.    estradiol (ESTRACE) 0.1 MG/GM vaginal cream INSERT 1 GRAM VAGINALLY TWICE WEEKLY AT BEDTIME   fenofibrate (TRICOR) 145 MG tablet Take 1 tablet (145 mg total) by mouth daily.   FLUoxetine (PROZAC) 40 MG  capsule Take 1 capsule (40 mg total) by mouth daily.   fluticasone (FLOVENT HFA) 110 MCG/ACT inhaler USE 2 PUFFS INHALED TWICE DAILY   ipratropium (ATROVENT HFA) 17 MCG/ACT inhaler INHALE 2 PUFFS 4 TIMES DAILY   ipratropium-albuterol (DUONEB) 0.5-2.5 (3) MG/3ML SOLN Take 3 mLs by nebulization every 4 (four) hours as needed (SOB, wheezing).   isometheptene-acetaminophen-dichloralphenazone (MIDRIN) 65-100-325 MG capsule Take 1 capsule by mouth 4 (four) times daily as needed for migraine. Maximum 5 capsules in 12 hours for migraine headaches, 8 capsules in 24 hours for tension headaches.   magnesium oxide (MAG-OX) 400 MG tablet Take 400 mg by mouth at bedtime.    montelukast (SINGULAIR) 10 MG tablet Take 1 tablet (10 mg total) by mouth daily.   omeprazole (PRILOSEC) 40 MG capsule TAKE 1 CAPSULE BY MOUTH EVERY DAY   polyethylene glycol powder (GLYCOLAX/MIRALAX) 17 GM/SCOOP powder Take 17 g by mouth every evening.   promethazine-dextromethorphan (PROMETHAZINE-DM) 6.25-15 MG/5ML syrup Take 5 mLs by mouth 4 (four) times daily as needed.   triamcinolone cream (KENALOG) 0.1 % Apply 1 application topically 2 (two) times daily as needed. For skin breakdown   vitamin B-12 (CYANOCOBALAMIN) 1000 MCG tablet Take 1,000 mcg by mouth daily.   zolpidem (AMBIEN) 5 MG tablet Take 0.5-1 tablets (2.5-5 mg total) by mouth at bedtime as needed for sleep.   [DISCONTINUED] ALPRAZolam (XANAX) 0.25 MG tablet Take 1 tablet (0.25 mg total) by mouth 2 (two) times daily as needed  for anxiety.   [DISCONTINUED] azithromycin (ZITHROMAX) 250 MG tablet Take one tab a day for 10 days for uri   [DISCONTINUED] cyclobenzaprine (FLEXERIL) 5 MG tablet Take 1-2 tablets (5-10 mg total) by mouth at bedtime.   [DISCONTINUED] HYDROcodone-acetaminophen (NORCO/VICODIN) 5-325 MG tablet Take 1 tablet by mouth daily as needed for severe pain.   [DISCONTINUED] levothyroxine (SYNTHROID) 100 MCG tablet TAKE 1 TABLET BY MOUTH EVERY DAY IN THE MORNING    [DISCONTINUED] lisinopril (ZESTRIL) 10 MG tablet Take 1 tablet (10 mg total) by mouth daily.   [DISCONTINUED] predniSONE (DELTASONE) 10 MG tablet Take one tab 3 x day for 3 days, then take one tab 2 x a day for 3 days and then take one tab a day for 3 days for copd   ALPRAZolam (XANAX) 0.25 MG tablet Take 1 tablet (0.25 mg total) by mouth 2 (two) times daily as needed for anxiety.   cyclobenzaprine (FLEXERIL) 5 MG tablet Take 1-2 tablets (5-10 mg total) by mouth at bedtime.   HYDROcodone-acetaminophen (NORCO/VICODIN) 5-325 MG tablet Take 1 tablet by mouth daily as needed for severe pain.   levothyroxine (SYNTHROID) 100 MCG tablet TAKE 1 TABLET BY MOUTH EVERY DAY IN THE MORNING   lisinopril (ZESTRIL) 10 MG tablet Take 1 tablet (10 mg total) by mouth daily.   No facility-administered encounter medications on file as of 01/30/2023.    Surgical History: Past Surgical History:  Procedure Laterality Date   BUNIONECTOMY     CATARACT EXTRACTION W/PHACO Right 10/11/2016   Procedure: CATARACT EXTRACTION PHACO AND INTRAOCULAR LENS PLACEMENT (IOC);  Surgeon: Birder Robson, MD;  Location: [facility] ORS;  Service: Ophthalmology;  Laterality: Right;  Lot# [id number] H [place]: 00:37.7 AP%: 18.1 CDE: 6.80   CATARACT EXTRACTION W/PHACO Left 11/08/2016   Procedure: CATARACT EXTRACTION PHACO AND INTRAOCULAR LENS PLACEMENT ([place]);  Surgeon: Birder Robson, MD;  Location: [facility] ORS;  Service: Ophthalmology;  Laterality: Left;  PACK LOT: [id number] H US:00:32 AP:44 CDE:6.46   COLONOSCOPY     COLONOSCOPY WITH PROPOFOL N/A 11/26/2015   Procedure: COLONOSCOPY WITH PROPOFOL;  Surgeon: Manya Silvas, MD;  Location: [facility] ENDOSCOPY;  Service: Endoscopy;  Laterality: N/A;   EYE SURGERY  07/23/2021   EYE SURGERY Left 09/24/2021   strabusmis, double vision   TONSILLECTOMY      Medical History: Past Medical History:  Diagnosis Date   Allergic rhinitis    Anxiety    Asthma    Blood transfusion without reported diagnosis     Depression    Diverticulosis    Dyspnea    Esophagitis    GERD (gastroesophageal reflux disease)    Headache    Heart murmur    Hyperlipemia    Hypertension    Hypothyroidism    Obesity    Osteopenia    Palpitations    Pneumothorax    Sleep apnea     Family History: Family History  Problem Relation Age of Onset   Breast cancer Maternal Grandmother    Stroke Maternal Grandmother    Bladder Cancer Father    Prostate cancer Father    Heart attack Father    Aortic aneurysm Father    Congestive Heart Failure Father    Colon cancer Paternal Grandmother    Aneurysm Paternal Grandmother    Stroke Paternal Grandmother    Congestive Heart Failure Paternal Grandmother    Dementia Mother    Stroke Mother    Dementia Maternal Grandfather    Stroke Maternal Grandfather    Dementia Paternal Grandfather  Stroke Paternal Grandfather     Social History   Socioeconomic History   Marital status: Married    Spouse name: Not on file   Number of children: Not on file   Years of education: Not on file   Highest education level: Not on file  Occupational History   Not on file  Tobacco Use   Smoking status: Former   Smokeless tobacco: Never  Vaping Use   Vaping Use: Never used  Substance and Sexual Activity   Alcohol use: No   Drug use: No   Sexual activity: Not Currently    Partners: Male    Birth control/protection: Post-menopausal  Other Topics Concern   Not on file  Social History Narrative   Not on file   Social Determinants of Health   Financial Resource Strain: Low Risk  (03/23/2021)   Overall Financial Resource Strain (CARDIA)    Difficulty of Paying Living Expenses: Not very hard  Food Insecurity: Not on file  Transportation Needs: Not on file  Physical Activity: Not on file  Stress: Not on file  Social Connections: Not on file  Intimate Partner Violence: Not on file      Review of Systems  Constitutional:  Negative for chills, fatigue and unexpected  weight change.  HENT:  Negative for congestion, rhinorrhea, sneezing and sore throat.   Eyes:  Negative for redness.  Respiratory:  Negative for cough, chest tightness and shortness of breath.   Cardiovascular: Negative.  Negative for chest pain and palpitations.  Gastrointestinal: Negative.  Negative for abdominal pain, constipation, diarrhea, nausea and vomiting.  Genitourinary:  Negative for dysuria and frequency.  Musculoskeletal:  Positive for arthralgias and back pain. Negative for joint swelling and neck pain.  Skin:  Negative for rash.  Neurological: Negative.  Negative for tremors and numbness.  Hematological:  Negative for adenopathy. Does not bruise/bleed easily.  Psychiatric/Behavioral:  Positive for sleep disturbance (improved with ambien). Negative for behavioral problems (Depression), self-injury and suicidal ideas. The patient is nervous/anxious (stable with medications).     Vital Signs: BP 127/71   Pulse 70   Temp (!) 97.1 F (36.2 C)   Resp 16   Ht '5\' 1"'$  (1.549 m)   Wt 144 lb 3.2 oz (65.4 kg)   SpO2 96%   BMI 27.25 kg/m    Physical Exam Vitals reviewed.  Constitutional:      General: She is not in acute distress.    Appearance: Normal appearance. She is normal weight. She is not ill-appearing.  HENT:     Head: Normocephalic and atraumatic.  Eyes:     Extraocular Movements: Extraocular movements intact.     Pupils: Pupils are equal, round, and reactive to light.  Cardiovascular:     Rate and Rhythm: Normal rate and regular rhythm.  Pulmonary:     Effort: Pulmonary effort is normal. No respiratory distress.  Neurological:     Mental Status: She is alert and oriented to person, place, and time.     Cranial Nerves: No cranial nerve deficit.     Coordination: Coordination normal.     Gait: Gait normal.  Psychiatric:        Mood and Affect: Mood normal.        Behavior: Behavior normal.        Assessment/Plan: 1. Essential hypertension,  benign Stable, continue lisinopril as prescribed.  - lisinopril (ZESTRIL) 10 MG tablet; Take 1 tablet (10 mg total) by mouth daily.  Dispense: 90 tablet; Refill: 3  2. Acquired hypothyroidism Stable, continue levothyroxine as prescribed.  - levothyroxine (SYNTHROID) 100 MCG tablet; TAKE 1 TABLET BY MOUTH EVERY DAY IN THE MORNING  Dispense: 90 tablet; Refill: 3  3. Chronic midline low back pain without sciatica Continue prn hydrocodone and cyclobenzaprine as prescribed.  - HYDROcodone-acetaminophen (NORCO/VICODIN) 5-325 MG tablet; Take 1 tablet by mouth daily as needed for severe pain.  Dispense: 30 tablet; Refill: 0 - cyclobenzaprine (FLEXERIL) 5 MG tablet; Take 1-2 tablets (5-10 mg total) by mouth at bedtime.  Dispense: 60 tablet; Refill: 2  4. GAD (generalized anxiety disorder) Continue prn alprazolam as prescribed.  - ALPRAZolam (XANAX) 0.25 MG tablet; Take 1 tablet (0.25 mg total) by mouth 2 (two) times daily as needed for anxiety.  Dispense: 60 tablet; Refill: 2   General Counseling: Kiante verbalizes understanding of the findings of todays visit and agrees with plan of treatment. I have discussed any further diagnostic evaluation that may be needed or ordered today. We also reviewed her medications today. she has been encouraged to call the office with any questions or concerns that should arise related to todays visit.    No orders of the defined types were placed in this encounter.   Meds ordered this encounter  Medications   ALPRAZolam (XANAX) 0.25 MG tablet    Sig: Take 1 tablet (0.25 mg total) by mouth 2 (two) times daily as needed for anxiety.    Dispense:  60 tablet    Refill:  2    F41.0 dx code   HYDROcodone-acetaminophen (NORCO/VICODIN) 5-325 MG tablet    Sig: Take 1 tablet by mouth daily as needed for severe pain.    Dispense:  30 tablet    Refill:  0    This is a decrease in dose from previous script, not an initial rx.   levothyroxine (SYNTHROID) 100 MCG tablet     Sig: TAKE 1 TABLET BY MOUTH EVERY DAY IN THE MORNING    Dispense:  90 tablet    Refill:  3    For future refills   lisinopril (ZESTRIL) 10 MG tablet    Sig: Take 1 tablet (10 mg total) by mouth daily.    Dispense:  90 tablet    Refill:  3    For future refills   cyclobenzaprine (FLEXERIL) 5 MG tablet    Sig: Take 1-2 tablets (5-10 mg total) by mouth at bedtime.    Dispense:  60 tablet    Refill:  2    Return in about 3 months (around 04/25/2023) for F/U, anxiety med refill, pain med refill, Jaedah Lords PCP.   Total time spent:30 Minutes Time spent includes review of chart, medications, test results, and follow up plan with the patient.   [place] Controlled Substance Database was reviewed by me.  This patient was seen by [name], FNP-C in collaboration with Dr. Clayborn Bigness as a part of collaborative care agreement.   Bevan Disney R. Valetta Fuller, MSN, FNP-C Internal medicine

## 2023-02-06 ENCOUNTER — Ambulatory Visit: Payer: Medicare Other | Admitting: Internal Medicine

## 2023-02-09 ENCOUNTER — Other Ambulatory Visit: Payer: Self-pay | Admitting: Internal Medicine

## 2023-02-09 ENCOUNTER — Telehealth: Payer: Self-pay | Admitting: Internal Medicine

## 2023-02-09 ENCOUNTER — Encounter: Payer: Self-pay | Admitting: Internal Medicine

## 2023-02-09 ENCOUNTER — Ambulatory Visit (INDEPENDENT_AMBULATORY_CARE_PROVIDER_SITE_OTHER): Payer: Medicare Other | Admitting: Internal Medicine

## 2023-02-09 VITALS — BP 124/71 | HR 79 | Temp 98.3°F | Resp 16 | Ht 61.0 in | Wt 142.2 lb

## 2023-02-09 DIAGNOSIS — Z760 Encounter for issue of repeat prescription: Secondary | ICD-10-CM

## 2023-02-09 DIAGNOSIS — J4540 Moderate persistent asthma, uncomplicated: Secondary | ICD-10-CM

## 2023-02-09 DIAGNOSIS — J4520 Mild intermittent asthma, uncomplicated: Secondary | ICD-10-CM | POA: Diagnosis not present

## 2023-02-09 DIAGNOSIS — G4733 Obstructive sleep apnea (adult) (pediatric): Secondary | ICD-10-CM | POA: Diagnosis not present

## 2023-02-09 MED ORDER — ALBUTEROL SULFATE HFA 108 (90 BASE) MCG/ACT IN AERS: 4.0000 | INHALATION_SPRAY | RESPIRATORY_TRACT | 5 refills | Status: DC | PRN

## 2023-02-09 MED ORDER — FLUTICASONE PROPIONATE HFA 110 MCG/ACT IN AERO: INHALATION_SPRAY | RESPIRATORY_TRACT | 3 refills | Status: DC

## 2023-02-09 MED ORDER — ATROVENT HFA 17 MCG/ACT IN AERS: INHALATION_SPRAY | RESPIRATORY_TRACT | 3 refills | Status: DC

## 2023-02-09 NOTE — Telephone Encounter (Signed)
Please clarify  and review

## 2023-02-09 NOTE — Patient Instructions (Signed)

## 2023-02-09 NOTE — Telephone Encounter (Signed)
SS order placed in [facility] folder-[name] 

## 2023-02-09 NOTE — Progress Notes (Signed)
[facility] [place], [place] [address]  Pulmonary Sleep Medicine   Office Visit Note  Patient Name: Bonnie West DOB: June 27, 1951 MRN [id number]  Date of Service: 02/09/2023  Complaints/HPI: She has been doing well. Only issue was she had covid in December and it did worsen her asthma. Patient states she is now at her baseline. She states that was her last flare up of her asthma. She also has a history of OSA. She had been prescribed CPAP at night previously but was not using it. She would like to get another study because she is having increased fatigues and daytime sleepiness. Wakes unrefreshed. Also she has history of migraines  ROS  General: (-) fever, (-) chills, (-) night sweats, (-) weakness Skin: (-) rashes, (-) itching,. Eyes: (-) visual changes, (-) redness, (-) itching. Nose and Sinuses: (-) nasal stuffiness or itchiness, (-) postnasal drip, (-) nosebleeds, (-) sinus trouble. Mouth and Throat: (-) sore throat, (-) hoarseness. Neck: (-) swollen glands, (-) enlarged thyroid, (-) neck pain. Respiratory: - cough, (-) bloody sputum, - shortness of breath, - wheezing. Cardiovascular: - ankle swelling, (-) chest pain. Lymphatic: (-) lymph node enlargement. Neurologic: (-) numbness, (-) tingling. Psychiatric: (-) anxiety, (-) depression   Current Medication: Outpatient Encounter Medications as of 02/09/2023  Medication Sig   acetaminophen (TYLENOL) 500 MG tablet Take 1,000 mg by mouth 2 (two) times daily as needed for mild pain or moderate pain.   albuterol (VENTOLIN HFA) 108 (90 Base) MCG/ACT inhaler Inhale 4 puffs into the lungs every 4 (four) hours as needed for wheezing or shortness of breath. INHALE 2 PUFFS BY MOUTH EVERY 6 HOURS   ALPRAZolam (XANAX) 0.25 MG tablet Take 1 tablet (0.25 mg total) by mouth 2 (two) times daily as needed for anxiety.   baclofen (LIORESAL) 10 MG tablet TAKE 1 TABLET BY MOUTH TWICE A DAY AS NEEDED FOR MUSCLE SPASMS    bisacodyl (DULCOLAX) 5 MG EC tablet Take 5-10 mg by mouth at bedtime as needed for moderate constipation. Two at night   cetirizine (ZYRTEC) 10 MG tablet Take 10 mg by mouth daily.   cholecalciferol (VITAMIN D) 1000 units tablet Take 1,000 Units by mouth at bedtime.   clindamycin (CLEOCIN T) 1 % external solution Apply 1 Application topically 2 (two) times daily as needed. For skin breakdown   cyclobenzaprine (FLEXERIL) 5 MG tablet Take 1-2 tablets (5-10 mg total) by mouth at bedtime.   docusate sodium (COLACE) 100 MG capsule Take 100-200 mg by mouth at bedtime as needed for mild constipation or moderate constipation.    estradiol (ESTRACE) 0.1 MG/GM vaginal cream INSERT 1 GRAM VAGINALLY TWICE WEEKLY AT BEDTIME   fenofibrate (TRICOR) 145 MG tablet Take 1 tablet (145 mg total) by mouth daily.   FLUoxetine (PROZAC) 40 MG capsule Take 1 capsule (40 mg total) by mouth daily.   fluticasone (FLOVENT HFA) 110 MCG/ACT inhaler USE 2 PUFFS INHALED TWICE DAILY   HYDROcodone-acetaminophen (NORCO/VICODIN) 5-325 MG tablet Take 1 tablet by mouth daily as needed for severe pain.   ipratropium (ATROVENT HFA) 17 MCG/ACT inhaler INHALE 2 PUFFS 4 TIMES DAILY   ipratropium-albuterol (DUONEB) 0.5-2.5 (3) MG/3ML SOLN Take 3 mLs by nebulization every 4 (four) hours as needed (SOB, wheezing).   isometheptene-acetaminophen-dichloralphenazone (MIDRIN) 65-100-325 MG capsule Take 1 capsule by mouth 4 (four) times daily as needed for migraine. Maximum 5 capsules in 12 hours for migraine headaches, 8 capsules in 24 hours for tension headaches.   levothyroxine (SYNTHROID) 100  MCG tablet TAKE 1 TABLET BY MOUTH EVERY DAY IN THE MORNING   lisinopril (ZESTRIL) 10 MG tablet Take 1 tablet (10 mg total) by mouth daily.   magnesium oxide (MAG-OX) 400 MG tablet Take 400 mg by mouth at bedtime.    montelukast (SINGULAIR) 10 MG tablet Take 1 tablet (10 mg total) by mouth daily.   omeprazole (PRILOSEC) 40 MG capsule TAKE 1 CAPSULE BY MOUTH  EVERY DAY   polyethylene glycol powder (GLYCOLAX/MIRALAX) 17 GM/SCOOP powder Take 17 g by mouth every evening.   promethazine-dextromethorphan (PROMETHAZINE-DM) 6.25-15 MG/5ML syrup Take 5 mLs by mouth 4 (four) times daily as needed.   triamcinolone cream (KENALOG) 0.1 % Apply 1 application topically 2 (two) times daily as needed. For skin breakdown   vitamin B-12 (CYANOCOBALAMIN) 1000 MCG tablet Take 1,000 mcg by mouth daily.   zolpidem (AMBIEN) 5 MG tablet Take 0.5-1 tablets (2.5-5 mg total) by mouth at bedtime as needed for sleep.   No facility-administered encounter medications on file as of 02/09/2023.    Surgical History: Past Surgical History:  Procedure Laterality Date   BUNIONECTOMY     CATARACT EXTRACTION W/PHACO Right 10/11/2016   Procedure: CATARACT EXTRACTION PHACO AND INTRAOCULAR LENS PLACEMENT (IOC);  Surgeon: Birder Robson, MD;  Location: [facility] ORS;  Service: Ophthalmology;  Laterality: Right;  Lot# [id number] H [place]: 00:37.7 AP%: 18.1 CDE: 6.80   CATARACT EXTRACTION W/PHACO Left 11/08/2016   Procedure: CATARACT EXTRACTION PHACO AND INTRAOCULAR LENS PLACEMENT ([place]);  Surgeon: Birder Robson, MD;  Location: [facility] ORS;  Service: Ophthalmology;  Laterality: Left;  PACK LOT: [id number] H US:00:32 AP:44 CDE:6.46   COLONOSCOPY     COLONOSCOPY WITH PROPOFOL N/A 11/26/2015   Procedure: COLONOSCOPY WITH PROPOFOL;  Surgeon: Manya Silvas, MD;  Location: [facility] ENDOSCOPY;  Service: Endoscopy;  Laterality: N/A;   EYE SURGERY  07/23/2021   EYE SURGERY Left 09/24/2021   strabusmis, double vision   TONSILLECTOMY      Medical History: Past Medical History:  Diagnosis Date   Allergic rhinitis    Anxiety    Asthma    Blood transfusion without reported diagnosis    Depression    Diverticulosis    Dyspnea    Esophagitis    GERD (gastroesophageal reflux disease)    Headache    Heart murmur    Hyperlipemia    Hypertension    Hypothyroidism    Obesity    Osteopenia     Palpitations    Pneumothorax    Sleep apnea     Family History: Family History  Problem Relation Age of Onset   Breast cancer Maternal Grandmother    Stroke Maternal Grandmother    Bladder Cancer Father    Prostate cancer Father    Heart attack Father    Aortic aneurysm Father    Congestive Heart Failure Father    Colon cancer Paternal Grandmother    Aneurysm Paternal Grandmother    Stroke Paternal Grandmother    Congestive Heart Failure Paternal Grandmother    Dementia Mother    Stroke Mother    Dementia Maternal Grandfather    Stroke Maternal Grandfather    Dementia Paternal Grandfather    Stroke Paternal Grandfather     Social History: Social History   Socioeconomic History   Marital status: Married    Spouse name: Not on file   Number of children: Not on file   Years of education: Not on file   Highest education level: Not on file  Occupational History   Not  on file  Tobacco Use   Smoking status: Former   Smokeless tobacco: Never  Vaping Use   Vaping Use: Never used  Substance and Sexual Activity   Alcohol use: No   Drug use: No   Sexual activity: Not Currently    Partners: Male    Birth control/protection: Post-menopausal  Other Topics Concern   Not on file  Social History Narrative   Not on file   Social Determinants of Health   Financial Resource Strain: Low Risk  (03/23/2021)   Overall Financial Resource Strain (CARDIA)    Difficulty of Paying Living Expenses: Not very hard  Food Insecurity: Not on file  Transportation Needs: Not on file  Physical Activity: Not on file  Stress: Not on file  Social Connections: Not on file  Intimate Partner Violence: Not on file    Vital Signs: Blood pressure 124/71, pulse 79, temperature 98.3 F (36.8 C), resp. rate 16, height 5\' 1"  (1.549 m), weight 142 lb 3.2 oz (64.5 kg), SpO2 95 %.  Examination: General Appearance: The patient is well-developed, well-nourished, and in no distress. Skin: Gross  inspection of skin unremarkable. Head: normocephalic, no gross deformities. Eyes: no gross deformities noted. ENT: ears appear grossly normal no exudates. Neck: Supple. No thyromegaly. No LAD. Respiratory: no rhonchi noted. Cardiovascular: Normal S1 and S2 without murmur or rub. Extremities: No cyanosis. pulses are equal. Neurologic: Alert and oriented. No involuntary movements.  LABS: No results found for this or any previous visit (from the past 2160 hour(s)).  Radiology: No results found.  No results found.  No results found.    Assessment and Plan: Patient Active Problem List   Diagnosis Date Noted   Vaginal atrophy 0000000   Diastolic dysfunction 0000000   OSA on CPAP 07/15/2020   Tightness in chest 05/20/2020   Hypercalcemia 05/20/2020   Primary insomnia 05/20/2020   Acute non-recurrent pansinusitis 02/12/2020   Encounter for general adult medical examination with abnormal findings 11/10/2019   Gastroesophageal reflux disease without esophagitis 11/10/2019   Dysuria 11/10/2019   Acute otitis externa of left ear 07/15/2019   Irritable bowel syndrome with constipation 07/15/2019   Chronic midline low back pain without sciatica 04/27/2019   Allergic rhinitis due to pollen 04/27/2019   Essential hypertension, benign 04/27/2019   GAD (generalized anxiety disorder) 09/01/2018   Chronic migraine without aura without status migrainosus, not intractable 09/01/2018   Encounter for long-term (current) use of medications 09/01/2018   Asthma without status asthmaticus 01/30/2018   Hyperlipidemia, unspecified 01/30/2018   Hypertension 01/30/2018   Obesity, unspecified 01/30/2018   Hypothyroidism 01/30/2018   Fatty infiltration of liver 12/10/2015   Abdominal pain, LLQ (left lower quadrant) 11/03/2015   History of adenomatous polyp of colon 11/03/2015   Hallux valgus, left 09/18/2014   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   Shoulder arthritis 12/19/2013    1.  Chronic asthma, mild intermittent, uncomplicated Under good control will refill meds today  2. Obesity, morbid ([place]) Obesity Counseling: Had a lengthy discussion regarding patients BMI and weight issues. Patient was instructed on portion control as well as increased activity. Also discussed caloric restrictions with trying to maintain intake less than 2000 Kcal. Discussions were made in accordance with the 5As of weight management. Simple actions such as not eating late and if able to, taking a walk is suggested.   3. Medication refill  - ipratropium (ATROVENT HFA) 17 MCG/ACT inhaler; INHALE 2 PUFFS 4 TIMES DAILY  Dispense: 1 each; Refill: 3 - fluticasone (  FLOVENT HFA) 110 MCG/ACT inhaler; USE 2 PUFFS INHALED TWICE DAILY  Dispense: 36 each; Refill: 3  4. Moderate persistent asthma without status asthmaticus without complication PFT results were reviewed with her - albuterol (VENTOLIN HFA) 108 (90 Base) MCG/ACT inhaler; Inhale 4 puffs into the lungs every 4 (four) hours as needed for wheezing or shortness of breath. INHALE 2 PUFFS BY MOUTH EVERY 6 HOURS  Dispense: 18 g; Refill: 5  5. OSA (obstructive sleep apnea) Will order a sleep study to reassess. She has EDS and also weight gain and history of migraines - PSG Sleep Study; Future   General Counseling: I have discussed the findings of the evaluation and examination with Vaughan Basta.  I have also discussed any further diagnostic evaluation thatmay be needed or ordered today. Arta verbalizes understanding of the findings of todays visit. We also reviewed her medications today and discussed drug interactions and side effects including but not limited excessive drowsiness and altered mental states. We also discussed that there is always a risk not just to her but also people around her. she has been encouraged to call the office with any questions or concerns that should arise related to todays visit.  Orders Placed This Encounter  Procedures   PSG  Sleep Study    Standing Status:   Future    Standing Expiration Date:   02/09/2024    Order Specific Question:   Where should this test be performed:    Answer:   [facility]     Time spent: 9  I have personally obtained a history, examined the patient, evaluated laboratory and imaging results, formulated the assessment and plan and placed orders.    Allyne Gee, MD [facility] Pulmonary and Critical Care Sleep medicine

## 2023-02-13 NOTE — Procedures (Signed)
[facility] [place], [address]    Complete Pulmonary Function Testing Interpretation:  FINDINGS:  Forced vital capacity is normal.  FEV1 is normal.  FEV1 FVC ratio is normal.  Postbronchodilator there was no significant change in FEV1.  Total lung capacity is borderline normal.  Residual limb is decreased.  Residual on total lung passive ratio is normal.  FRC is decreased.  DLCO was mildly decreased and normal when corrected for alveolar volume  IMPRESSION:  This pulmonary function study is suggestive of a mild restrictive lung disease but the overall spirometry is normal.  Clinical correlation is recommended  Allyne Gee, MD [facility] Pulmonary Critical Care Medicine Sleep Medicine

## 2023-02-15 ENCOUNTER — Ambulatory Visit: Payer: Medicare Other | Admitting: Nurse Practitioner

## 2023-02-16 LAB — PULMONARY FUNCTION TEST

## 2023-03-06 ENCOUNTER — Telehealth: Payer: Self-pay | Admitting: Internal Medicine

## 2023-03-06 NOTE — Telephone Encounter (Signed)
Patient scheduled for psg on 03/07/23 @ [facility] .tat

## 2023-03-07 DIAGNOSIS — Z87891 Personal history of nicotine dependence: Secondary | ICD-10-CM | POA: Diagnosis not present

## 2023-03-07 DIAGNOSIS — K219 Gastro-esophageal reflux disease without esophagitis: Secondary | ICD-10-CM | POA: Diagnosis not present

## 2023-03-07 DIAGNOSIS — R531 Weakness: Secondary | ICD-10-CM | POA: Diagnosis not present

## 2023-03-07 DIAGNOSIS — Z79890 Hormone replacement therapy (postmenopausal): Secondary | ICD-10-CM | POA: Diagnosis not present

## 2023-03-07 DIAGNOSIS — E785 Hyperlipidemia, unspecified: Secondary | ICD-10-CM | POA: Diagnosis not present

## 2023-03-07 DIAGNOSIS — R079 Chest pain, unspecified: Secondary | ICD-10-CM | POA: Diagnosis not present

## 2023-03-07 DIAGNOSIS — R002 Palpitations: Secondary | ICD-10-CM | POA: Diagnosis not present

## 2023-03-07 DIAGNOSIS — Z885 Allergy status to narcotic agent status: Secondary | ICD-10-CM | POA: Diagnosis not present

## 2023-03-07 DIAGNOSIS — R110 Nausea: Secondary | ICD-10-CM | POA: Diagnosis not present

## 2023-03-07 DIAGNOSIS — Z79899 Other long term (current) drug therapy: Secondary | ICD-10-CM | POA: Diagnosis not present

## 2023-03-07 DIAGNOSIS — E039 Hypothyroidism, unspecified: Secondary | ICD-10-CM | POA: Diagnosis not present

## 2023-03-07 DIAGNOSIS — I10 Essential (primary) hypertension: Secondary | ICD-10-CM | POA: Diagnosis not present

## 2023-03-07 DIAGNOSIS — Z882 Allergy status to sulfonamides status: Secondary | ICD-10-CM | POA: Diagnosis not present

## 2023-03-07 DIAGNOSIS — R0789 Other chest pain: Secondary | ICD-10-CM | POA: Diagnosis not present

## 2023-03-08 ENCOUNTER — Telehealth: Payer: Self-pay | Admitting: Nurse Practitioner

## 2023-03-08 DIAGNOSIS — R5383 Other fatigue: Secondary | ICD-10-CM | POA: Diagnosis not present

## 2023-03-08 DIAGNOSIS — R0602 Shortness of breath: Secondary | ICD-10-CM | POA: Diagnosis not present

## 2023-03-08 DIAGNOSIS — R0789 Other chest pain: Secondary | ICD-10-CM | POA: Diagnosis not present

## 2023-03-08 DIAGNOSIS — R052 Subacute cough: Secondary | ICD-10-CM | POA: Diagnosis not present

## 2023-03-08 DIAGNOSIS — E7800 Pure hypercholesterolemia, unspecified: Secondary | ICD-10-CM | POA: Diagnosis not present

## 2023-03-08 DIAGNOSIS — I10 Essential (primary) hypertension: Secondary | ICD-10-CM | POA: Diagnosis not present

## 2023-03-08 NOTE — Telephone Encounter (Signed)
S/w patient to schedule ED follow up. She stated she has appointment with cardio. Will call if she needs us-[name]

## 2023-03-15 ENCOUNTER — Telehealth: Payer: Self-pay | Admitting: Internal Medicine

## 2023-03-15 NOTE — Telephone Encounter (Signed)
Left vm and sent mychart message to schedule SS follow up--[name]

## 2023-03-17 DIAGNOSIS — R911 Solitary pulmonary nodule: Secondary | ICD-10-CM | POA: Diagnosis not present

## 2023-03-17 DIAGNOSIS — J432 Centrilobular emphysema: Secondary | ICD-10-CM | POA: Diagnosis not present

## 2023-03-17 DIAGNOSIS — I5189 Other ill-defined heart diseases: Secondary | ICD-10-CM | POA: Diagnosis not present

## 2023-03-17 DIAGNOSIS — R0789 Other chest pain: Secondary | ICD-10-CM | POA: Diagnosis not present

## 2023-03-17 DIAGNOSIS — R5383 Other fatigue: Secondary | ICD-10-CM | POA: Diagnosis not present

## 2023-03-17 DIAGNOSIS — I3481 Nonrheumatic mitral (valve) annulus calcification: Secondary | ICD-10-CM | POA: Diagnosis not present

## 2023-03-17 DIAGNOSIS — R0602 Shortness of breath: Secondary | ICD-10-CM | POA: Diagnosis not present

## 2023-03-17 DIAGNOSIS — J811 Chronic pulmonary edema: Secondary | ICD-10-CM | POA: Diagnosis not present

## 2023-03-28 DIAGNOSIS — R002 Palpitations: Secondary | ICD-10-CM | POA: Diagnosis not present

## 2023-03-28 DIAGNOSIS — R0602 Shortness of breath: Secondary | ICD-10-CM | POA: Diagnosis not present

## 2023-03-28 DIAGNOSIS — R079 Chest pain, unspecified: Secondary | ICD-10-CM | POA: Diagnosis not present

## 2023-03-28 DIAGNOSIS — I2510 Atherosclerotic heart disease of native coronary artery without angina pectoris: Secondary | ICD-10-CM | POA: Diagnosis not present

## 2023-03-28 DIAGNOSIS — E7849 Other hyperlipidemia: Secondary | ICD-10-CM | POA: Diagnosis not present

## 2023-03-29 ENCOUNTER — Encounter (INDEPENDENT_AMBULATORY_CARE_PROVIDER_SITE_OTHER): Payer: Medicare Other | Admitting: Internal Medicine

## 2023-03-29 DIAGNOSIS — G4733 Obstructive sleep apnea (adult) (pediatric): Secondary | ICD-10-CM | POA: Diagnosis not present

## 2023-03-29 DIAGNOSIS — G4719 Other hypersomnia: Secondary | ICD-10-CM

## 2023-04-11 ENCOUNTER — Encounter: Payer: Self-pay | Admitting: Internal Medicine

## 2023-04-11 ENCOUNTER — Ambulatory Visit (INDEPENDENT_AMBULATORY_CARE_PROVIDER_SITE_OTHER): Payer: Medicare Other | Admitting: Internal Medicine

## 2023-04-11 VITALS — BP 140/90 | HR 82 | Temp 98.5°F | Resp 16 | Ht 61.0 in | Wt 140.6 lb

## 2023-04-11 DIAGNOSIS — J440 Chronic obstructive pulmonary disease with acute lower respiratory infection: Secondary | ICD-10-CM | POA: Diagnosis not present

## 2023-04-11 DIAGNOSIS — K219 Gastro-esophageal reflux disease without esophagitis: Secondary | ICD-10-CM

## 2023-04-11 DIAGNOSIS — J4540 Moderate persistent asthma, uncomplicated: Secondary | ICD-10-CM

## 2023-04-11 DIAGNOSIS — J209 Acute bronchitis, unspecified: Secondary | ICD-10-CM | POA: Diagnosis not present

## 2023-04-11 DIAGNOSIS — G4733 Obstructive sleep apnea (adult) (pediatric): Secondary | ICD-10-CM | POA: Diagnosis not present

## 2023-04-11 MED ORDER — LEVOFLOXACIN 750 MG PO TABS
750.0000 mg | ORAL_TABLET | Freq: Every day | ORAL | 0 refills | Status: DC
Start: 2023-04-11 — End: 2023-04-24

## 2023-04-11 MED ORDER — PREDNISONE 10 MG (21) PO TBPK
ORAL_TABLET | ORAL | 0 refills | Status: DC
Start: 2023-04-11 — End: 2023-04-24

## 2023-04-11 NOTE — Procedures (Signed)
[facility]  Polysomnogram Report Part I                                                               Phone: [phone] Fax: [phone]  Patient Name: Bonnie West, Bonnie West. Acquisition Number: [id number]  Date of Birth: September 07, 1951 Acquisition Date: 03/29/2023  Referring Physician: Yevonne Pax, MD     History: The patient is a 72 year old  who was referred for re-evaluation of obstructive sleep apnea. Medical History: allergic rhinitis, anxiety, asthma, blood transfusion without reported diagnosis, depression, diverticulosis, dyspnea, esophagitis, GERD, headache, heart murmur, hyperlipemia, hypertension, hypothyroidism, obesity.  Medications: Tylenol, Ventolin HFA, Xanax, Lioresal, Dulcolax, Zyrtec, Vitamin D, Cleocin T, Flexeri, Colace, Estrace, Tricor, Prozac, Flovent HFA, Norco/Vicodin, Atrovent HFA, Duoneb, Midrin, Synthroid, Zestril, Mag-ox, Singulair, Prilosec, Glycolax/Miralax, Promethazine-DM, Kenalog, Cyanocobalamin, Ambien.  Procedure: This routine overnight polysomnogram was performed on the [place] 5 using the standard diagnostic protocol. This included 6 channels of EEG, 2 channels of EOG, chin EMG, bilateral anterior tibialis EMG, nasal/oral thermistor, PTAF (nasal pressure transducer), chest and abdominal wall movements, EKG, and pulse oximetry.  Description: The total recording time was 473.5 minutes. The total sleep time was 390.5 minutes. There were a total of 79.0 minutes of wakefulness after sleep onset for a reducedsleep efficiency of 82.5%. The latency to sleep onset was shortat 4.0 minutes. The R sleep onset latency was prolonged at 229.5 minutes. Sleep parameters, as a percentage of the total sleep time, demonstrated 12.0% of sleep was in N1 sleep, 74.9% N2, 0.0% N3 and 13.1% R sleep. There were a total of 22 arousals for an arousal index of 3.4 arousals per hour of sleep that was normal.  Respiratory monitoring demonstrated   snoring . There were 127 apneas and  hypopneas for an Apnea Hypopnea Index of 19.5 apneas and hypopneas per hour of sleep. The REM related apnea hypopnea index was 9.4/hr of REM sleep compared to a NREM AHI of 20.9/hr.  The average duration of the respiratory events was 18.8 seconds with a maximum duration of 33.0 seconds. The respiratory events occurred ., however they were more frequent in the supine position with an AHI of 27.0 The respiratory events were associated with peripheral oxygen desaturations on the average to 90%. The lowest oxygen desaturation associated with a respiratory event was 87%. Additionally, the baseline oxygen saturation during wakefulness was 94%, during NREM sleep averaged 93%, and during REM sleep averaged 93%. The total duration of oxygen < 90% was 1.0 minutes.  Cardiac monitoring-  demonstrate transient cardiac decelerations associated with the apneas.  significant cardiac rhythm irregularities.   Periodic limb movement monitoring- demonstrated that there were 553 periodic limb movements for a periodic limb movement index of 85.0 periodic limb movements per hour of sleep. Frequent, quasi-periodic limb movements were observed during periods of wakefulness.  Impression: This routine overnight polysomnogram demonstrated significant obstructive sleep apnea with an overall Apnea Hypopnea Index of 19.5 apneas and hypopneas per hour of sleep. The respiratory events were more frequent in the supine position with an AHI of 27.0. The lowest desaturation was to 87%.    There was a highly elevated periodic limb movement index of 85.0 periodic limb movements per hour of sleep. The patient reports symptoms suggestive of restless leg syndrome.  Sometimes these limb movements subside once the apnea is controlled.   reduced sleep efficiency withincreased awakenings, a reduced REM percentage and no slow wave sleep. These findings would appear to be due to the combination of obstructive sleep apnea and periodic limb movements.    Recommendations:    A CPAP titration would be recommended due to the severity of the sleep apnea. Some supine sleep should be ensured to optimize the titration. Additionally, would recommend weight loss in a patient with a BMI of 26.8.     Yevonne Pax, MD, [facility] Diplomate ABMS-Pulmonary, Critical Care and Sleep Medicine  Electronically reviewed and digitally signed  [facility] Polysomnogram Report Part II  Phone: [phone] Fax: [phone]  Patient last name [place] Neck Size 16.0 in. Acquisition [phone]  Patient first name Bonnie West. Weight 142.0 lbs. Started 03/29/2023 at 10:03:07 PM  Birth date 12/13/1950 Height 61.0 in. Stopped 03/30/2023 at 6:05:43 AM  Age 17 BMI 26.8 lb/in2 Duration 473.5  Study Type Adult      Bonnie West / Bonnie P.  Reviewed by: Valentino Hue Henke, PhD, ABSM, FAASM Sleep Data: Lights Out: 10:08:07 PM Sleep Onset: 10:12:07 PM  Lights On: 6:01:37 AM Sleep Efficiency: 82.5 %  Total Recording Time: 473.5 min Sleep Latency (from Lights Off) 4.0 min  Total Sleep Time (TST): 390.5 min R Latency (from Sleep Onset): 229.5 min  Sleep Period Time: 469.5 min Total number of awakenings: 25  Wake during sleep: 79.0 min Wake After Sleep Onset (WASO): 79.0 min   Sleep Data:         Arousal Summary: Stage  Latency from lights out (min) Latency from sleep onset (min) Duration (min) % Total Sleep Time  Normal values  N 1 4.0 0.0 47.0 12.0 (5%)  N 2 60.0 56.0 292.5 74.9 (50%)  N 3       0.0 0.0 (20%)  R 233.5 229.5 51.0 13.1 (25%)   Number Index  Spontaneous 12 1.8  Apneas & Hypopneas 2 0.3  RERAs 0 0.0       (Apneas & Hypopneas & RERAs)  (2) (0.3)  Limb Movement 11 1.7  Snore 0 0.0  TOTAL 25 3.8     Respiratory Data:  CA OA MA Apnea Hypopnea* A+ H RERA Total  Number 2 27 0 29 98 127 0 127  Mean Dur (sec) 19.8 18.0 0.0 18.1 19.0 18.8 0.0 18.8  Max Dur (sec) 22.0 24.0 0.0 24.0 33.0 33.0 0.0 33.0  Total Dur (min) 0.7 8.1 0.0 8.8 31.1  39.9 0.0 39.9  % of TST 0.2 2.1 0.0 2.2 8.0 10.2 0.0 10.2  Index (#/h TST) 0.3 4.1 0.0 4.5 15.1 19.5 0.0 19.5  *Hypopneas scored based on 4% or greater desaturation.  Sleep Stage:        REM NREM TST  AHI 9.4 20.9 19.5  RDI 9.4 20.9 19.5           Body Position Data:  Sleep (min) TST (%) REM (min) NREM (min) CA (#) OA (#) MA (#) HYP (#) AHI (#/h) RERA (#) RDI (#/h) Desat (#)  Supine 193.0 49.42 23.6 169.4 1 21  0 65 27.0 0 27.0 205  Non-Supine 197.50 50.58 27.40 170.10 1.00 6.00 0.00 33.00 12.15 0 12.15 102.00  Left: 57.4 14.70 0.0 57.4 0 0 0 14 14.6 0 14.6 42  Right: 140.1 35.88 27.4 112.7 1 6  0 19 11.1 0 11.1 60     Snoring: Total number  of snoring episodes  0  Total time with snoring    min (   % of sleep)   Oximetry Distribution:             WK REM NREM TOTAL  Average (%)   94 93 93 93  < 90% 0.1 0.0 0.9 1.0  < 80% 0.0 0.0 0.0 0.0  < 70% 0.0 0.0 0.0 0.0  # of Desaturations* 3 25 279 307  Desat Index (#/hour) 2.6 29.4 49.3 47.2  Desat Max (%) 4 6 9 9   Desat Max Dur (sec) 27.0 66.0 61.0 66.0  Approx Min O2 during sleep 87  Approx min O2 during a respiratory event 87  Was Oxygen added (Y/N) and final rate :    LPM  *Desaturations based on 3% or greater drop from baseline.   Cheyne Stokes Breathing: None Present  Heart Rate Summary:  Average Heart Rate During Sleep 56.0 bpm      Highest Heart Rate During Sleep (95th %) 117.0 bpm      Highest Heart Rate During Sleep 179 bpm (artifact)  Highest Heart Rate During Recording (TIB) 179 bpm (artifact)   Heart Rate Observations: Event Type # Events   Bradycardia 0 Lowest HR Scored: N/A  Sinus Tachycardia During Sleep 0 Highest HR Scored: N/A  Narrow Complex Tachycardia 0 Highest HR Scored: N/A  Wide Complex Tachycardia 0 Highest HR Scored: N/A  Asystole 0 Longest Pause: N/A  Atrial Fibrillation 0 Duration Longest Event: N/A  Other Arrythmias   Type:    Periodic Limb Movement Data: (Primary legs unless  otherwise noted) Total # Limb Movement 573 Limb Movement Index 88.0  Total # PLMS 553 PLMS Index 85.0  Total # PLMS Arousals 10 PLMS Arousal Index 1.5  Percentage Sleep Time with PLMS 270.74min (69.3 % sleep)  Mean Duration limb movements (secs) 600.9

## 2023-04-11 NOTE — Progress Notes (Signed)
[facility] [address] [place], [place] [address]  Pulmonary Sleep Medicine   Office Visit Note  Patient Name: Bonnie West DOB: 03-Jan-1951 MRN [id number]  Date of Service: 04/11/2023  Complaints/HPI: She states she has been having cough and wheeze sinc about Sunday. She states it is worse when she lays down. She has no fevers but feels that her glands are swollen. She shas no sputum noted  Office Spirometry Results:     ROS  General: (-) fever, (-) chills, (-) night sweats, (-) weakness Skin: (-) rashes, (-) itching,. Eyes: (-) visual changes, (-) redness, (-) itching. Nose and Sinuses: (-) nasal stuffiness or itchiness, (-) postnasal drip, (-) nosebleeds, (-) sinus trouble. Mouth and Throat: (-) sore throat, (-) hoarseness. Neck: (-) swollen glands, (-) enlarged thyroid, (-) neck pain. Respiratory: + cough, (-) bloody sputum, + shortness of breath, + wheezing. Cardiovascular: - ankle swelling, (-) chest pain. Lymphatic: (-) lymph node enlargement. Neurologic: (-) numbness, (-) tingling. Psychiatric: (-) anxiety, (-) depression   Current Medication: Outpatient Encounter Medications as of 04/11/2023  Medication Sig   acetaminophen (TYLENOL) 500 MG tablet Take 1,000 mg by mouth 2 (two) times daily as needed for mild pain or moderate pain.   albuterol (VENTOLIN HFA) 108 (90 Base) MCG/ACT inhaler Inhale 1-2 puffs into the lungs every 4 (four) hours as needed for wheezing or shortness of breath.   ALPRAZolam (XANAX) 0.25 MG tablet Take 1 tablet (0.25 mg total) by mouth 2 (two) times daily as needed for anxiety.   baclofen (LIORESAL) 10 MG tablet TAKE 1 TABLET BY MOUTH TWICE A DAY AS NEEDED FOR MUSCLE SPASMS   bisacodyl (DULCOLAX) 5 MG EC tablet Take 5-10 mg by mouth at bedtime as needed for moderate constipation. Two at night   cetirizine (ZYRTEC) 10 MG tablet Take 10 mg by mouth daily.   cholecalciferol (VITAMIN D) 1000 units tablet Take 1,000 Units by mouth at  bedtime.   clindamycin (CLEOCIN T) 1 % external solution Apply 1 Application topically 2 (two) times daily as needed. For skin breakdown   cyclobenzaprine (FLEXERIL) 5 MG tablet Take 1-2 tablets (5-10 mg total) by mouth at bedtime.   docusate sodium (COLACE) 100 MG capsule Take 100-200 mg by mouth at bedtime as needed for mild constipation or moderate constipation.    estradiol (ESTRACE) 0.1 MG/GM vaginal cream INSERT 1 GRAM VAGINALLY TWICE WEEKLY AT BEDTIME   fenofibrate (TRICOR) 145 MG tablet Take 1 tablet (145 mg total) by mouth daily.   FLUoxetine (PROZAC) 40 MG capsule Take 1 capsule (40 mg total) by mouth daily.   fluticasone (FLOVENT HFA) 110 MCG/ACT inhaler USE 2 PUFFS INHALED TWICE DAILY   HYDROcodone-acetaminophen (NORCO/VICODIN) 5-325 MG tablet Take 1 tablet by mouth daily as needed for severe pain.   ipratropium (ATROVENT HFA) 17 MCG/ACT inhaler INHALE 2 PUFFS 4 TIMES DAILY   ipratropium-albuterol (DUONEB) 0.5-2.5 (3) MG/3ML SOLN Take 3 mLs by nebulization every 4 (four) hours as needed (SOB, wheezing).   isometheptene-acetaminophen-dichloralphenazone (MIDRIN) 65-100-325 MG capsule Take 1 capsule by mouth 4 (four) times daily as needed for migraine. Maximum 5 capsules in 12 hours for migraine headaches, 8 capsules in 24 hours for tension headaches.   levothyroxine (SYNTHROID) 100 MCG tablet TAKE 1 TABLET BY MOUTH EVERY DAY IN THE MORNING   lisinopril (ZESTRIL) 10 MG tablet Take 1 tablet (10 mg total) by mouth daily.   magnesium oxide (MAG-OX) 400 MG tablet Take 400 mg by mouth at bedtime.  montelukast (SINGULAIR) 10 MG tablet Take 1 tablet (10 mg total) by mouth daily.   omeprazole (PRILOSEC) 40 MG capsule TAKE 1 CAPSULE BY MOUTH EVERY DAY   polyethylene glycol powder (GLYCOLAX/MIRALAX) 17 GM/SCOOP powder Take 17 g by mouth every evening.   promethazine-dextromethorphan (PROMETHAZINE-DM) 6.25-15 MG/5ML syrup Take 5 mLs by mouth 4 (four) times daily as needed.   triamcinolone cream  (KENALOG) 0.1 % Apply 1 application topically 2 (two) times daily as needed. For skin breakdown   vitamin B-12 (CYANOCOBALAMIN) 1000 MCG tablet Take 1,000 mcg by mouth daily.   zolpidem (AMBIEN) 5 MG tablet Take 0.5-1 tablets (2.5-5 mg total) by mouth at bedtime as needed for sleep.   No facility-administered encounter medications on file as of 04/11/2023.    Surgical History: Past Surgical History:  Procedure Laterality Date   BUNIONECTOMY     CATARACT EXTRACTION W/PHACO Right 10/11/2016   Procedure: CATARACT EXTRACTION PHACO AND INTRAOCULAR LENS PLACEMENT (IOC);  Surgeon: Galen Manila, MD;  Location: [facility] ORS;  Service: Ophthalmology;  Laterality: Right;  Lot# [id number] H [place]: 00:37.7 AP%: 18.1 CDE: 6.80   CATARACT EXTRACTION W/PHACO Left 11/08/2016   Procedure: CATARACT EXTRACTION PHACO AND INTRAOCULAR LENS PLACEMENT (IOC);  Surgeon: Galen Manila, MD;  Location: [facility] ORS;  Service: Ophthalmology;  Laterality: Left;  PACK LOT: [id number] H US:00:32 AP:44 CDE:6.46   COLONOSCOPY     COLONOSCOPY WITH PROPOFOL N/A 11/26/2015   Procedure: COLONOSCOPY WITH PROPOFOL;  Surgeon: Scot Jun, MD;  Location: [facility] ENDOSCOPY;  Service: Endoscopy;  Laterality: N/A;   EYE SURGERY  07/23/2021   EYE SURGERY Left 09/24/2021   strabusmis, double vision   TONSILLECTOMY      Medical History: Past Medical History:  Diagnosis Date   Allergic rhinitis    Anxiety    Asthma    Blood transfusion without reported diagnosis    Depression    Diverticulosis    Dyspnea    Esophagitis    GERD (gastroesophageal reflux disease)    Headache    Heart murmur    Hyperlipemia    Hypertension    Hypothyroidism    Obesity    Osteopenia    Palpitations    Pneumothorax    Sleep apnea     Family History: Family History  Problem Relation Age of Onset   Breast cancer Maternal Grandmother    Stroke Maternal Grandmother    Bladder Cancer Father    Prostate cancer Father    Heart attack Father     Aortic aneurysm Father    Congestive Heart Failure Father    Colon cancer Paternal Grandmother    Aneurysm Paternal Grandmother    Stroke Paternal Grandmother    Congestive Heart Failure Paternal Grandmother    Dementia Mother    Stroke Mother    Dementia Maternal Grandfather    Stroke Maternal Grandfather    Dementia Paternal Grandfather    Stroke Paternal Grandfather     Social History: Social History   Socioeconomic History   Marital status: Married    Spouse name: Not on file   Number of children: Not on file   Years of education: Not on file   Highest education level: Not on file  Occupational History   Not on file  Tobacco Use   Smoking status: Former   Smokeless tobacco: Never  Vaping Use   Vaping Use: Never used  Substance and Sexual Activity   Alcohol use: No   Drug use: No   Sexual activity: Not Currently  Partners: Male    Birth control/protection: Post-menopausal  Other Topics Concern   Not on file  Social History Narrative   Not on file   Social Determinants of Health   Financial Resource Strain: Low Risk  (03/23/2021)   Overall Financial Resource Strain (CARDIA)    Difficulty of Paying Living Expenses: Not very hard  Food Insecurity: Not on file  Transportation Needs: Not on file  Physical Activity: Not on file  Stress: Not on file  Social Connections: Not on file  Intimate Partner Violence: Not on file    Vital Signs: Blood pressure (!) 140/90, pulse 82, temperature 98.5 F (36.9 C), resp. rate 16, height 5\' 1"  (1.549 m), weight 140 lb 9.6 oz (63.8 kg), SpO2 97 %.  Examination: General Appearance: The patient is well-developed, well-nourished, and in no distress. Skin: Gross inspection of skin unremarkable. Head: normocephalic, no gross deformities. Eyes: no gross deformities noted. ENT: ears appear grossly normal no exudates. Neck: Supple. No thyromegaly. No LAD. Respiratory: scattered few rhonchi noted. Cardiovascular: Normal S1 and  S2 without murmur or rub. Extremities: No cyanosis. pulses are equal. Neurologic: Alert and oriented. No involuntary movements.  LABS: Recent Results (from the past 2160 hour(s))  Pulmonary Function Test     Status: None   Collection Time: 02/16/23  3:24 PM  Result Value Ref Range   FEV1     FVC     FEV1/FVC     TLC     DLCO      Radiology: No results found.  No results found.  No results found.  Assessment and Plan: Patient Active Problem List   Diagnosis Date Noted   Vaginal atrophy 08/20/2020   Diastolic dysfunction 07/15/2020   OSA on CPAP 07/15/2020   Tightness in chest 05/20/2020   Hypercalcemia 05/20/2020   Primary insomnia 05/20/2020   Acute non-recurrent pansinusitis 02/12/2020   Encounter for general adult medical examination with abnormal findings 11/10/2019   Gastroesophageal reflux disease without esophagitis 11/10/2019   Dysuria 11/10/2019   Acute otitis externa of left ear 07/15/2019   Irritable bowel syndrome with constipation 07/15/2019   Chronic midline low back pain without sciatica 04/27/2019   Allergic rhinitis due to pollen 04/27/2019   Essential hypertension, benign 04/27/2019   GAD (generalized anxiety disorder) 09/01/2018   Chronic migraine without aura without status migrainosus, not intractable 09/01/2018   Encounter for long-term (current) use of medications 09/01/2018   Asthma without status asthmaticus 01/30/2018   Hyperlipidemia, unspecified 01/30/2018   Hypertension 01/30/2018   Obesity, unspecified 01/30/2018   Hypothyroidism 01/30/2018   Fatty infiltration of liver 12/10/2015   Abdominal pain, LLQ (left lower quadrant) 11/03/2015   History of adenomatous polyp of colon 11/03/2015   Hallux valgus, left 09/18/2014   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   Shoulder arthritis 12/19/2013    1. Moderate persistent asthma without status asthmaticus without complication - levofloxacin (LEVAQUIN) 750 MG tablet; Take 1 tablet (750  mg total) by mouth daily.  Dispense: 7 tablet; Refill: 0 - predniSONE (STERAPRED UNI-PAK 21 TAB) 10 MG (21) TBPK tablet; Take one prdnisone dose pack for 7 days taper  Dispense: 1 each; Refill: 0  2. OSA (obstructive sleep apnea) She had a sleep study and is supposed to come in for her second study scheduled in June  3. Acute bronchitis with COPD (HCC) Prednisone and a levaquin pack will be given   4. GERD Maybe contributing to the cough will get a follow up UGI also to look  for dysmotility  General Counseling: I have discussed the findings of the evaluation and examination with Bonita Quin.  I have also discussed any further diagnostic evaluation thatmay be needed or ordered today. Fara verbalizes understanding of the findings of todays visit. We also reviewed her medications today and discussed drug interactions and side effects including but not limited excessive drowsiness and altered mental states. We also discussed that there is always a risk not just to her but also people around her. she has been encouraged to call the office with any questions or concerns that should arise related to todays visit.  No orders of the defined types were placed in this encounter.    Time spent: 46  I have personally obtained a history, examined the patient, evaluated laboratory and imaging results, formulated the assessment and plan and placed orders.    Yevonne Pax, MD [facility] Pulmonary and Critical Care Sleep medicine

## 2023-04-12 ENCOUNTER — Telehealth: Payer: Self-pay | Admitting: Internal Medicine

## 2023-04-12 NOTE — Telephone Encounter (Signed)
Notified patient of UGI appointment date, arrival time, location and npo after midnight-[name]

## 2023-04-18 ENCOUNTER — Inpatient Hospital Stay
Admission: EM | Admit: 2023-04-18 | Discharge: 2023-04-24 | DRG: 191 | Disposition: A | Discharge status: Home / Self Care | Payer: Medicare Other | Attending: Internal Medicine | Admitting: Internal Medicine

## 2023-04-18 ENCOUNTER — Telehealth: Payer: Self-pay

## 2023-04-18 ENCOUNTER — Emergency Department: Payer: Medicare Other

## 2023-04-18 DIAGNOSIS — E039 Hypothyroidism, unspecified: Secondary | ICD-10-CM | POA: Diagnosis present

## 2023-04-18 DIAGNOSIS — Z79890 Hormone replacement therapy (postmenopausal): Secondary | ICD-10-CM | POA: Diagnosis not present

## 2023-04-18 DIAGNOSIS — J45901 Unspecified asthma with (acute) exacerbation: Secondary | ICD-10-CM | POA: Diagnosis present

## 2023-04-18 DIAGNOSIS — G4730 Sleep apnea, unspecified: Secondary | ICD-10-CM | POA: Diagnosis present

## 2023-04-18 DIAGNOSIS — E669 Obesity, unspecified: Secondary | ICD-10-CM | POA: Diagnosis present

## 2023-04-18 DIAGNOSIS — Z961 Presence of intraocular lens: Secondary | ICD-10-CM | POA: Diagnosis present

## 2023-04-18 DIAGNOSIS — R0602 Shortness of breath: Secondary | ICD-10-CM | POA: Diagnosis present

## 2023-04-18 DIAGNOSIS — M545 Low back pain, unspecified: Secondary | ICD-10-CM

## 2023-04-18 DIAGNOSIS — Z881 Allergy status to other antibiotic agents status: Secondary | ICD-10-CM

## 2023-04-18 DIAGNOSIS — F32A Depression, unspecified: Secondary | ICD-10-CM | POA: Diagnosis present

## 2023-04-18 DIAGNOSIS — J441 Chronic obstructive pulmonary disease with (acute) exacerbation: Secondary | ICD-10-CM | POA: Diagnosis not present

## 2023-04-18 DIAGNOSIS — E876 Hypokalemia: Secondary | ICD-10-CM | POA: Diagnosis present

## 2023-04-18 DIAGNOSIS — Z87891 Personal history of nicotine dependence: Secondary | ICD-10-CM

## 2023-04-18 DIAGNOSIS — E873 Alkalosis: Secondary | ICD-10-CM | POA: Diagnosis not present

## 2023-04-18 DIAGNOSIS — Z760 Encounter for issue of repeat prescription: Secondary | ICD-10-CM

## 2023-04-18 DIAGNOSIS — Z79899 Other long term (current) drug therapy: Secondary | ICD-10-CM

## 2023-04-18 DIAGNOSIS — J309 Allergic rhinitis, unspecified: Secondary | ICD-10-CM | POA: Diagnosis present

## 2023-04-18 DIAGNOSIS — Z6826 Body mass index (BMI) 26.0-26.9, adult: Secondary | ICD-10-CM

## 2023-04-18 DIAGNOSIS — Z7951 Long term (current) use of inhaled steroids: Secondary | ICD-10-CM

## 2023-04-18 DIAGNOSIS — Z1152 Encounter for screening for COVID-19: Secondary | ICD-10-CM

## 2023-04-18 DIAGNOSIS — Z886 Allergy status to analgesic agent status: Secondary | ICD-10-CM | POA: Diagnosis not present

## 2023-04-18 DIAGNOSIS — Z8249 Family history of ischemic heart disease and other diseases of the circulatory system: Secondary | ICD-10-CM | POA: Diagnosis not present

## 2023-04-18 DIAGNOSIS — I5030 Unspecified diastolic (congestive) heart failure: Secondary | ICD-10-CM

## 2023-04-18 DIAGNOSIS — I5032 Chronic diastolic (congestive) heart failure: Secondary | ICD-10-CM | POA: Diagnosis present

## 2023-04-18 DIAGNOSIS — E785 Hyperlipidemia, unspecified: Secondary | ICD-10-CM | POA: Diagnosis not present

## 2023-04-18 DIAGNOSIS — K219 Gastro-esophageal reflux disease without esophagitis: Secondary | ICD-10-CM | POA: Diagnosis not present

## 2023-04-18 DIAGNOSIS — I110 Hypertensive heart disease with heart failure: Secondary | ICD-10-CM | POA: Diagnosis present

## 2023-04-18 DIAGNOSIS — I5189 Other ill-defined heart diseases: Secondary | ICD-10-CM

## 2023-04-18 DIAGNOSIS — I471 Supraventricular tachycardia, unspecified: Secondary | ICD-10-CM | POA: Diagnosis not present

## 2023-04-18 DIAGNOSIS — J9621 Acute and chronic respiratory failure with hypoxia: Secondary | ICD-10-CM | POA: Diagnosis not present

## 2023-04-18 DIAGNOSIS — R002 Palpitations: Secondary | ICD-10-CM | POA: Diagnosis not present

## 2023-04-18 DIAGNOSIS — I10 Essential (primary) hypertension: Secondary | ICD-10-CM | POA: Diagnosis not present

## 2023-04-18 DIAGNOSIS — F411 Generalized anxiety disorder: Secondary | ICD-10-CM | POA: Diagnosis not present

## 2023-04-18 DIAGNOSIS — Z885 Allergy status to narcotic agent status: Secondary | ICD-10-CM | POA: Diagnosis not present

## 2023-04-18 DIAGNOSIS — Z882 Allergy status to sulfonamides status: Secondary | ICD-10-CM | POA: Diagnosis not present

## 2023-04-18 DIAGNOSIS — R079 Chest pain, unspecified: Secondary | ICD-10-CM | POA: Diagnosis not present

## 2023-04-18 LAB — BASIC METABOLIC PANEL
Anion gap: 8 (ref 5–15)
BUN: 22 mg/dL (ref 8–23)
CO2: 24 mmol/L (ref 22–32)
Calcium: 9.4 mg/dL (ref 8.9–10.3)
Chloride: 106 mmol/L (ref 98–111)
Creatinine, Ser: 1.12 mg/dL — ABNORMAL HIGH (ref 0.44–1.00)
GFR, Estimated: 52 mL/min — ABNORMAL LOW (ref >60)
Glucose, Bld: 131 mg/dL — ABNORMAL HIGH (ref 70–99)
Potassium: 3.6 mmol/L (ref 3.5–5.1)
Sodium: 138 mmol/L (ref 135–145)

## 2023-04-18 LAB — BLOOD GAS, VENOUS
Acid-Base Excess: 2.8 mmol/L — ABNORMAL HIGH (ref 0.0–2.0)
Bicarbonate: 26.1 mmol/L (ref 20.0–28.0)
O2 Saturation: 94.1 %
Patient temperature: 37
pCO2, Ven: 35 mmHg — ABNORMAL LOW (ref 44–60)
pH, Ven: 7.48 — ABNORMAL HIGH (ref 7.25–7.43)
pO2, Ven: 63 mmHg — ABNORMAL HIGH (ref 32–45)

## 2023-04-18 LAB — CBC
HCT: 41.9 % (ref 36.0–46.0)
Hemoglobin: 14.4 g/dL (ref 12.0–15.0)
MCH: 31.4 pg (ref 26.0–34.0)
MCHC: 34.4 g/dL (ref 30.0–36.0)
MCV: 91.3 fL (ref 80.0–100.0)
Platelets: 313 10*3/uL (ref 150–400)
RBC: 4.59 MIL/uL (ref 3.87–5.11)
RDW: 12.2 % (ref 11.5–15.5)
WBC: 11.9 10*3/uL — ABNORMAL HIGH (ref 4.0–10.5)
nRBC: 0 % (ref 0.0–0.2)

## 2023-04-18 LAB — RESP PANEL BY RT-PCR (RSV, FLU A&B, COVID)  RVPGX2
Influenza A by PCR: NEGATIVE
Influenza B by PCR: NEGATIVE
Resp Syncytial Virus by PCR: NEGATIVE
SARS Coronavirus 2 by RT PCR: NEGATIVE

## 2023-04-18 MED ORDER — LISINOPRIL 10 MG PO TABS
10.0000 mg | ORAL_TABLET | Freq: Every day | ORAL | Status: DC
  Administered 2023-04-18 – 2023-04-24 (×7): 10 mg via ORAL
  Filled 2023-04-18 (×8): qty 1

## 2023-04-18 MED ORDER — IPRATROPIUM-ALBUTEROL 0.5-2.5 (3) MG/3ML IN SOLN
3.0000 mL | RESPIRATORY_TRACT | Status: DC | PRN
  Administered 2023-04-19: 3 mL via RESPIRATORY_TRACT
  Filled 2023-04-18: qty 3

## 2023-04-18 MED ORDER — ACETAMINOPHEN 500 MG PO TABS: 1000.0000 mg | ORAL_TABLET | Freq: Two times a day (BID) | ORAL | Status: DC | PRN

## 2023-04-18 MED ORDER — METHYLPREDNISOLONE SODIUM SUCC 125 MG IJ SOLR
125.0000 mg | Freq: Once | INTRAMUSCULAR | Status: AC
  Administered 2023-04-18: 125 mg via INTRAVENOUS
  Filled 2023-04-18: qty 2

## 2023-04-18 MED ORDER — IPRATROPIUM-ALBUTEROL 0.5-2.5 (3) MG/3ML IN SOLN
RESPIRATORY_TRACT | Status: AC
  Administered 2023-04-18: 6 mL via RESPIRATORY_TRACT
  Filled 2023-04-18: qty 6

## 2023-04-18 MED ORDER — ALBUTEROL SULFATE (2.5 MG/3ML) 0.083% IN NEBU
10.0000 mg/h | INHALATION_SOLUTION | Freq: Once | RESPIRATORY_TRACT | Status: AC
  Administered 2023-04-18: 10 mg/h via RESPIRATORY_TRACT
  Filled 2023-04-18: qty 15

## 2023-04-18 MED ORDER — ALPRAZOLAM 0.25 MG PO TABS
0.2500 mg | ORAL_TABLET | Freq: Two times a day (BID) | ORAL | Status: DC | PRN
  Administered 2023-04-18 – 2023-04-24 (×7): 0.25 mg via ORAL
  Filled 2023-04-18 (×8): qty 1

## 2023-04-18 MED ORDER — IPRATROPIUM-ALBUTEROL 0.5-2.5 (3) MG/3ML IN SOLN: 6.0000 mL | Freq: Once | RESPIRATORY_TRACT | Status: AC

## 2023-04-18 MED ORDER — MONTELUKAST SODIUM 10 MG PO TABS
10.0000 mg | ORAL_TABLET | Freq: Every day | ORAL | Status: DC
  Administered 2023-04-18 – 2023-04-24 (×7): 10 mg via ORAL
  Filled 2023-04-18 (×7): qty 1

## 2023-04-18 MED ORDER — LEVOTHYROXINE SODIUM 100 MCG PO TABS
100.0000 ug | ORAL_TABLET | Freq: Every day | ORAL | Status: DC
  Administered 2023-04-19 – 2023-04-24 (×6): 100 ug via ORAL
  Filled 2023-04-18 (×5): qty 1
  Filled 2023-04-18: qty 2

## 2023-04-18 MED ORDER — FLUOXETINE HCL 20 MG PO CAPS
40.0000 mg | ORAL_CAPSULE | Freq: Every day | ORAL | Status: DC
  Administered 2023-04-18 – 2023-04-24 (×7): 40 mg via ORAL
  Filled 2023-04-18 (×7): qty 2

## 2023-04-18 MED ORDER — PANTOPRAZOLE SODIUM 40 MG PO TBEC
40.0000 mg | DELAYED_RELEASE_TABLET | Freq: Every day | ORAL | Status: DC
  Administered 2023-04-18 – 2023-04-24 (×7): 40 mg via ORAL
  Filled 2023-04-18 (×7): qty 1

## 2023-04-18 MED ORDER — LORAZEPAM 2 MG/ML IJ SOLN: 0.5000 mg | Freq: Once | INTRAMUSCULAR | Status: DC

## 2023-04-18 MED ORDER — IPRATROPIUM-ALBUTEROL 0.5-2.5 (3) MG/3ML IN SOLN
6.0000 mL | Freq: Once | RESPIRATORY_TRACT | Status: AC
  Administered 2023-04-18: 6 mL via RESPIRATORY_TRACT
  Filled 2023-04-18: qty 6

## 2023-04-18 MED ORDER — ACETAMINOPHEN 325 MG PO TABS
650.0000 mg | ORAL_TABLET | Freq: Four times a day (QID) | ORAL | Status: DC | PRN
  Administered 2023-04-23: 650 mg via ORAL
  Filled 2023-04-18: qty 2

## 2023-04-18 MED ORDER — HYDROCODONE-ACETAMINOPHEN 5-325 MG PO TABS
1.0000 | ORAL_TABLET | Freq: Every day | ORAL | Status: DC | PRN
  Administered 2023-04-18: 1 via ORAL
  Filled 2023-04-18: qty 1

## 2023-04-18 MED ORDER — FENOFIBRATE 54 MG PO TABS
54.0000 mg | ORAL_TABLET | Freq: Every day | ORAL | Status: DC
  Administered 2023-04-18 – 2023-04-24 (×7): 54 mg via ORAL
  Filled 2023-04-18 (×7): qty 1

## 2023-04-18 MED ORDER — PREDNISONE 20 MG PO TABS
40.0000 mg | ORAL_TABLET | Freq: Every day | ORAL | Status: DC
  Administered 2023-04-19 – 2023-04-20 (×2): 40 mg via ORAL
  Filled 2023-04-18 (×2): qty 2

## 2023-04-18 MED ORDER — MAGNESIUM SULFATE 2 GM/50ML IV SOLN
2.0000 g | Freq: Once | INTRAVENOUS | Status: AC
  Administered 2023-04-18: 2 g via INTRAVENOUS
  Filled 2023-04-18: qty 50

## 2023-04-18 MED ORDER — ENOXAPARIN SODIUM 40 MG/0.4ML IJ SOSY
40.0000 mg | PREFILLED_SYRINGE | INTRAMUSCULAR | Status: DC
  Administered 2023-04-19 – 2023-04-23 (×5): 40 mg via SUBCUTANEOUS
  Filled 2023-04-18 (×5): qty 0.4

## 2023-04-18 NOTE — ED Triage Notes (Signed)
Pt to ED for [facility] for 1 week. Dx with bronchitis. Given prednisone. States shob is worsening. Pt unable to speak in complete sentences.

## 2023-04-18 NOTE — Assessment & Plan Note (Signed)
Continue Synthroid °

## 2023-04-18 NOTE — Assessment & Plan Note (Signed)
-  Continue Prozac and Xanax 

## 2023-04-18 NOTE — ED Provider Notes (Signed)
[facility] Provider Note    Event Date/Time   First MD Initiated Contact with Patient 04/18/23 1054     (approximate)   History   Shortness of Breath   HPI  Bonnie West is a 72 y.o. female with history of asthma, COPD presenting to the emergency department for evaluation of shortness of breath.  For the last several days, patient has had ongoing cough and wheezing.  No fevers.  She was seen by her pulmonologist a week ago and diagnosed with bronchitis for which she was placed on Levaquin and prednisone.  She reports she is completed this, but has continued to have cough with difficulty breathing, worsening today.      Physical Exam   Triage Vital Signs: ED Triage Vitals  Enc Vitals Group     BP 04/18/23 1040 137/80     Pulse Rate 04/18/23 1040 95     Resp 04/18/23 1040 (!) 24     Temp 04/18/23 1041 98.2 F (36.8 C)     Temp src --      SpO2 04/18/23 1040 96 %     Weight 04/18/23 1041 132 lb (59.9 kg)     Height 04/18/23 1041 5\' 1"  (1.549 m)     Head Circumference --      Peak Flow --      Pain Score 04/18/23 1040 0     Pain Loc --      Pain Edu? --      Excl. in GC? --     Most recent vital signs: Vitals:   04/18/23 1500 04/18/23 1549  BP: 131/73   Pulse: (!) 115 (!) 112  Resp: (!) 22 (!) 21  Temp:    SpO2: 97% 97%     General: Awake, interactive  CV:  Mild tachycardia with regular rhythm, normal peripheral perfusion Resp:  Lung sounds diminished bilaterally with poor air movement, expiratory wheezing, labored respirations with intercostal and supraclavicular retractions noted, unable to speak in full sentences due to dyspnea Abd:  Soft, nondistended.  Neuro:  Symmetric facial movement, moving extremity spontaneously   ED Results / Procedures / Treatments   Labs (all labs ordered are listed, but only abnormal results are displayed) Labs Reviewed  CBC - Abnormal; Notable for the following components:      Result Value    WBC 11.9 (*)    All other components within normal limits  BASIC METABOLIC PANEL - Abnormal; Notable for the following components:   Glucose, Bld 131 (*)    Creatinine, Ser 1.12 (*)    GFR, Estimated 52 (*)    All other components within normal limits  BLOOD GAS, VENOUS - Abnormal; Notable for the following components:   pH, Ven 7.48 (*)    pCO2, Ven 35 (*)    pO2, Ven 63 (*)    Acid-Base Excess 2.8 (*)    All other components within normal limits  RESP PANEL BY RT-PCR (RSV, FLU A&B, COVID)  [id number]     EKG EKG independently reviewed interpreted by myself (ER attending) demonstrates:  EKG demonstrates normal sinus rhythm at a rate of 97, PR 134, QRS 76, QTc 436, no acute ST changes  RADIOLOGY Imaging independently reviewed and interpreted by myself demonstrates:  CXR without pneumonia  PROCEDURES:  Critical Care performed: Yes, see critical care procedure note(s)  CRITICAL CARE Performed by: Trinna Post   Total critical care time: 30 minutes  Critical care time was exclusive of separately  billable procedures and treating other patients.  Critical care was necessary to treat or prevent imminent or life-threatening deterioration.  Critical care was time spent personally by me on the following activities: development of treatment plan with patient and/or surrogate as well as nursing, discussions with consultants, evaluation of patient's response to treatment, examination of patient, obtaining history from patient or surrogate, ordering and performing treatments and interventions, ordering and review of laboratory studies, ordering and review of radiographic studies, pulse oximetry and re-evaluation of patient's condition.   Procedures   MEDICATIONS ORDERED IN ED: Medications  pantoprazole (PROTONIX) EC tablet 40 mg (has no administration in time range)  fenofibrate tablet 54 mg (has no administration in time range)  ALPRAZolam (XANAX) tablet 0.25 mg (has no administration in  time range)  FLUoxetine (PROZAC) capsule 40 mg (has no administration in time range)  HYDROcodone-acetaminophen (NORCO/VICODIN) 5-325 MG per tablet 1 tablet (has no administration in time range)  levothyroxine (SYNTHROID) tablet 100 mcg (has no administration in time range)  lisinopril (ZESTRIL) tablet 10 mg (has no administration in time range)  montelukast (SINGULAIR) tablet 10 mg (has no administration in time range)  enoxaparin (LOVENOX) injection 40 mg (has no administration in time range)  ipratropium-albuterol (DUONEB) 0.5-2.5 (3) MG/3ML nebulizer solution 3 mL (has no administration in time range)  predniSONE (DELTASONE) tablet 40 mg (has no administration in time range)  acetaminophen (TYLENOL) tablet 650 mg (has no administration in time range)  LORazepam (ATIVAN) injection 0.5 mg (has no administration in time range)  ipratropium-albuterol (DUONEB) 0.5-2.5 (3) MG/3ML nebulizer solution 6 mL (6 mLs Nebulization Given 04/18/23 1110)  albuterol (PROVENTIL) (2.5 MG/3ML) 0.083% nebulizer solution (10 mg/hr Nebulization Given 04/18/23 1125)  methylPREDNISolone sodium succinate (SOLU-MEDROL) 125 mg/2 mL injection 125 mg (125 mg Intravenous Given 04/18/23 1119)  ipratropium-albuterol (DUONEB) 0.5-2.5 (3) MG/3ML nebulizer solution 6 mL (6 mLs Nebulization Given 04/18/23 1427)  magnesium sulfate IVPB 2 g 50 mL (0 g Intravenous Stopped 04/18/23 1506)     IMPRESSION / MDM / ASSESSMENT AND PLAN / ED COURSE  I reviewed the triage vital signs and the nursing notes.  Differential diagnosis includes, but is not limited to, asthma exacerbation, pneumonia, viral illness, lower suspicion PE given abnormal lung exam  Patient's presentation is most consistent with acute presentation with potential threat to life or bodily function.  72 year old female presenting to the emergency department for evaluation of shortness of breath.  Initial vital signs notable for tachypnea without associated hypoxia though  patient with labored respirations on exam.  Very poor air movement with bilateral wheezing.  She was ordered for multiple DuoNeb treatments as well as continuous albuterol with consideration for BiPAP if work of breathing did not improve.  On reevaluation, and patient initially did have significant improvement in her work of breathing. Her blood work was overall reassuring and her blood gas demonstrated a mild respiratory alkalosis, likely secondary to her tachypnea.  Do not think there is an indication for BiPAP at this time.  While in the ER, she did began to have recurrent worsening shortness of breath.  I have ordered her for additional nebulizer treatments as well as for IV magnesium.  Will reach out to hospitalist team for admission.    FINAL CLINICAL IMPRESSION(S) / ED DIAGNOSES   Final diagnoses:  Exacerbation of asthma, unspecified asthma severity, unspecified whether persistent     Rx / DC Orders   ED Discharge Orders     None  Note:  This document was prepared using Dragon voice recognition software and may include unintentional dictation errors.   Trinna Post, MD 04/18/23 [phone]

## 2023-04-18 NOTE — H&P (Signed)
History and Physical    Patient: Bonnie West:[id number] DOB: Feb 06, 1951 DOA: 04/18/2023 DOS: the patient was seen and examined on 04/18/2023 PCP: Sallyanne Kuster, NP  Patient coming from: Home  Chief Complaint:  Chief Complaint  Patient presents with   Shortness of Breath   HPI: Bonnie West is a 72 y.o. female with medical history significant of asthma, GERD, hypertension, hyperlipidemia, diastolic dysfunction, hypothyroidism presenting with asthma exacerbation.  Patient ports 1 to 2 weeks of increased work of breathing, cough, wheezing.  No fevers or chills.  Positive mild rhinorrhea.  Was seen by her pulmonologist roughly 1 week ago.  Was given a course of Levaquin and prednisone for treatment.  Patient reports persistent symptoms despite treatment no chest pain.  No abdominal pain.  No nausea or vomiting.  Has been using home inhalers with minimal improvement in symptoms.  No orthopnea or PND.  Patient called her pulmonologist and was redirected to the ER for further evaluation per report. Presented to the ER afebrile, heart rate 100s, BP stable.  Satting well on room air.  White count 11.9, hemoglobin 14, platelets 313, COVID flu and RSV negative.  VBG with a respiratory alkalosis.  Creatinine 1.12.  Chest x-ray within normal limits. Review of Systems: As mentioned in the history of present illness. All other systems reviewed and are negative. Past Medical History:  Diagnosis Date   Allergic rhinitis    Anxiety    Asthma    Blood transfusion without reported diagnosis    Depression    Diverticulosis    Dyspnea    Esophagitis    GERD (gastroesophageal reflux disease)    Headache    Heart murmur    Hyperlipemia    Hypertension    Hypothyroidism    Obesity    Osteopenia    Palpitations    Pneumothorax    Sleep apnea    Past Surgical History:  Procedure Laterality Date   BUNIONECTOMY     CATARACT EXTRACTION W/PHACO Right 10/11/2016   Procedure: CATARACT EXTRACTION  PHACO AND INTRAOCULAR LENS PLACEMENT (IOC);  Surgeon: Galen Manila, MD;  Location: [facility] ORS;  Service: Ophthalmology;  Laterality: Right;  Lot# [id number] H [place]: 00:37.7 AP%: 18.1 CDE: 6.80   CATARACT EXTRACTION W/PHACO Left 11/08/2016   Procedure: CATARACT EXTRACTION PHACO AND INTRAOCULAR LENS PLACEMENT (IOC);  Surgeon: Galen Manila, MD;  Location: [facility] ORS;  Service: Ophthalmology;  Laterality: Left;  PACK LOT: [id number] H US:00:32 AP:44 CDE:6.46   COLONOSCOPY     COLONOSCOPY WITH PROPOFOL N/A 11/26/2015   Procedure: COLONOSCOPY WITH PROPOFOL;  Surgeon: Scot Jun, MD;  Location: [facility] ENDOSCOPY;  Service: Endoscopy;  Laterality: N/A;   EYE SURGERY  07/23/2021   EYE SURGERY Left 09/24/2021   strabusmis, double vision   TONSILLECTOMY     Social History:  reports that she has quit smoking. She has never used smokeless tobacco. She reports that she does not drink alcohol and does not use drugs.  Allergies  Allergen Reactions   Advair Diskus [Fluticasone-Salmeterol] Other (See Comments)    Causes asthma attack    Codeine    Erythromycin Other (See Comments)    Stomach pain   Morphine And Codeine Itching and Nausea And Vomiting   Nsaids    Sulfa Antibiotics Other (See Comments)    unknown    Family History  Problem Relation Age of Onset   Breast cancer Maternal Grandmother    Stroke Maternal Grandmother    Bladder Cancer Father    Prostate  cancer Father    Heart attack Father    Aortic aneurysm Father    Congestive Heart Failure Father    Colon cancer Paternal Grandmother    Aneurysm Paternal Grandmother    Stroke Paternal Grandmother    Congestive Heart Failure Paternal Grandmother    Dementia Mother    Stroke Mother    Dementia Maternal Grandfather    Stroke Maternal Grandfather    Dementia Paternal Grandfather    Stroke Paternal Grandfather     Prior to Admission medications   Medication Sig Start Date End Date Taking? Authorizing Provider  acetaminophen  (TYLENOL) 500 MG tablet Take 1,000 mg by mouth 2 (two) times daily as needed for mild pain or moderate pain.    [provider]  albuterol (VENTOLIN HFA) 108 (90 Base) MCG/ACT inhaler Inhale 1-2 puffs into the lungs every 4 (four) hours as needed for wheezing or shortness of breath. 02/09/23   Sallyanne Kuster, NP  ALPRAZolam (XANAX) 0.25 MG tablet Take 1 tablet (0.25 mg total) by mouth 2 (two) times daily as needed for anxiety. 01/30/23   Sallyanne Kuster, NP  baclofen (LIORESAL) 10 MG tablet TAKE 1 TABLET BY MOUTH TWICE A DAY AS NEEDED FOR MUSCLE SPASMS 11/08/22   Sallyanne Kuster, NP  bisacodyl (DULCOLAX) 5 MG EC tablet Take 5-10 mg by mouth at bedtime as needed for moderate constipation. Two at night    [provider]  cetirizine (ZYRTEC) 10 MG tablet Take 10 mg by mouth daily.    [provider]  cholecalciferol (VITAMIN D) 1000 units tablet Take 1,000 Units by mouth at bedtime.    [provider]  clindamycin (CLEOCIN T) 1 % external solution Apply 1 Application topically 2 (two) times daily as needed. For skin breakdown 11/08/22   Sallyanne Kuster, NP  cyclobenzaprine (FLEXERIL) 5 MG tablet Take 1-2 tablets (5-10 mg total) by mouth at bedtime. 01/30/23   Sallyanne Kuster, NP  docusate sodium (COLACE) 100 MG capsule Take 100-200 mg by mouth at bedtime as needed for mild constipation or moderate constipation.     [provider]  estradiol (ESTRACE) 0.1 MG/GM vaginal cream INSERT 1 GRAM VAGINALLY TWICE WEEKLY AT BEDTIME 08/20/20   Carlean Jews, NP  fenofibrate (TRICOR) 145 MG tablet Take 1 tablet (145 mg total) by mouth daily. 11/08/22   Sallyanne Kuster, NP  FLUoxetine (PROZAC) 40 MG capsule Take 1 capsule (40 mg total) by mouth daily. 11/08/22   Sallyanne Kuster, NP  fluticasone (FLOVENT HFA) 110 MCG/ACT inhaler USE 2 PUFFS INHALED TWICE DAILY 02/09/23   Yevonne Pax, MD  HYDROcodone-acetaminophen (NORCO/VICODIN) 5-325 MG tablet Take 1  tablet by mouth daily as needed for severe pain. 01/30/23   Sallyanne Kuster, NP  ipratropium (ATROVENT HFA) 17 MCG/ACT inhaler INHALE 2 PUFFS 4 TIMES DAILY 02/09/23   Freda Munro A, MD  ipratropium-albuterol (DUONEB) 0.5-2.5 (3) MG/3ML SOLN Take 3 mLs by nebulization every 4 (four) hours as needed (SOB, wheezing). 11/08/22   Sallyanne Kuster, NP  isometheptene-acetaminophen-dichloralphenazone (MIDRIN) 65-100-325 MG capsule Take 1 capsule by mouth 4 (four) times daily as needed for migraine. Maximum 5 capsules in 12 hours for migraine headaches, 8 capsules in 24 hours for tension headaches.    [provider]  levofloxacin (LEVAQUIN) 750 MG tablet Take 1 tablet (750 mg total) by mouth daily. 04/11/23   Yevonne Pax, MD  levothyroxine (SYNTHROID) 100 MCG tablet TAKE 1 TABLET BY MOUTH EVERY DAY IN THE MORNING 01/30/23   Sallyanne Kuster,  NP  lisinopril (ZESTRIL) 10 MG tablet Take 1 tablet (10 mg total) by mouth daily. 01/30/23   Sallyanne Kuster, NP  magnesium oxide (MAG-OX) 400 MG tablet Take 400 mg by mouth at bedtime.     [provider]  montelukast (SINGULAIR) 10 MG tablet Take 1 tablet (10 mg total) by mouth daily. 08/04/22   Sallyanne Kuster, NP  omeprazole (PRILOSEC) 40 MG capsule TAKE 1 CAPSULE BY MOUTH EVERY DAY 05/26/22   Sallyanne Kuster, NP  polyethylene glycol powder (GLYCOLAX/MIRALAX) 17 GM/SCOOP powder Take 17 g by mouth every evening. 07/15/19   Carlean Jews, NP  predniSONE (STERAPRED UNI-PAK 21 TAB) 10 MG (21) TBPK tablet Take one prdnisone dose pack for 7 days taper 04/11/23   Yevonne Pax, MD  promethazine-dextromethorphan (PROMETHAZINE-DM) 6.25-15 MG/5ML syrup Take 5 mLs by mouth 4 (four) times daily as needed. 10/22/22   Brimage, Seward Meth, DO  triamcinolone cream (KENALOG) 0.1 % Apply 1 application topically 2 (two) times daily as needed. For skin breakdown    [provider]  vitamin B-12 (CYANOCOBALAMIN) 1000 MCG tablet Take 1,000 mcg by mouth daily.     [provider]  zolpidem (AMBIEN) 5 MG tablet Take 0.5-1 tablets (2.5-5 mg total) by mouth at bedtime as needed for sleep. 11/08/22   Sallyanne Kuster, NP    Physical Exam: Vitals:   04/18/23 1400 04/18/23 1430 04/18/23 1500 04/18/23 1549  BP: (!) 144/74 (!) 141/86 131/73   Pulse: (!) 106 (!) 115 (!) 115 (!) 112  Resp: (!) 23 (!) 22 (!) 22 (!) 21  Temp:      SpO2: 93% 97% 97% 97%  Weight:      Height:       Physical Exam Constitutional:      Appearance: She is normal weight.     Comments: + mild to moderate increased WOB   HENT:     Head: Normocephalic.     Nose: Nose normal.     Mouth/Throat:     Mouth: Mucous membranes are moist.  Eyes:     Pupils: Pupils are equal, round, and reactive to light.  Cardiovascular:     Rate and Rhythm: Normal rate. Rhythm irregular.  Pulmonary:     Breath sounds: Wheezing present.     Comments: Mild to moderate increased WOB Abdominal:     General: Abdomen is flat.  Musculoskeletal:        General: Normal range of motion.     Cervical back: Normal range of motion.  Skin:    General: Skin is warm.  Neurological:     General: No focal deficit present.  Psychiatric:        Mood and Affect: Mood normal.     Data Reviewed:  There are no new results to review at this time. DG Chest Port 1 View CLINICAL DATA:  One-week history of shortness of breath  EXAM: PORTABLE CHEST 1 VIEW  COMPARISON:  Chest radiograph dated 04/02/2022  FINDINGS: Normal lung volumes. No focal consolidations. No pleural effusion or pneumothorax. The heart size and mediastinal contours are within normal limits. No acute osseous abnormality.  IMPRESSION: No active disease.  Electronically Signed   By: Agustin Cree M.D.   On: 04/18/2023 11:34  Lab Results  Component Value Date   WBC 11.9 (H) 04/18/2023   HGB 14.4 04/18/2023   HCT 41.9 04/18/2023   MCV 91.3 04/18/2023   PLT 313 04/18/2023   Last metabolic panel Lab Results  Component  Value  Date   GLUCOSE 131 (H) 04/18/2023   NA 138 04/18/2023   K 3.6 04/18/2023   CL 106 04/18/2023   CO2 24 04/18/2023   BUN 22 04/18/2023   CREATININE 1.12 (H) 04/18/2023   GFRNONAA 52 (L) 04/18/2023   CALCIUM 9.4 04/18/2023   PROT 7.4 03/28/2022   ALBUMIN 4.5 03/28/2022   LABGLOB 2.1 02/04/2022   AGRATIO 2.2 02/04/2022   BILITOT 0.8 03/28/2022   ALKPHOS 40 03/28/2022   AST 28 03/28/2022   ALT 19 03/28/2022   ANIONGAP 8 04/18/2023    Assessment and Plan: * Asthma exacerbation Acute decompensated asthma exacerbation with increased work of breathing, wheezing, cough Failed outpatient management with Levaquin and prednisone by [facility] pulmonology No hypoxia present IV Solu-Medrol IV Levaquin DuoNebs BiPAP as needed Formally consult [facility] pulmonology for recommendations Follow  Diastolic dysfunction 2D echo July 2021 with EF of around 65% and diastolic dysfunction Appears clinically dry to euvolemic Monitor volume status with treatment Follow  Essential hypertension, benign BP stable Titrate home regimen  GAD (generalized anxiety disorder) Continue Prozac and Xanax  Hypothyroidism Continue Synthroid  Hyperlipidemia, unspecified Continue fenofibrate      Advance Care Planning:   Code Status: Full Code   Consults: [facility] pulmonology   Family Communication: No family at the bedside   Severity of Illness: The appropriate patient status for this patient is INPATIENT. Inpatient status is judged to be reasonable and necessary in order to provide the required intensity of service to ensure the patient's safety. The patient's presenting symptoms, physical exam findings, and initial radiographic and laboratory data in the context of their chronic comorbidities is felt to place them at high risk for further clinical deterioration. Furthermore, it is not anticipated that the patient will be medically stable for discharge from the hospital within 2 midnights of admission.   * I  certify that at the point of admission it is my clinical judgment that the patient will require inpatient hospital care spanning beyond 2 midnights from the point of admission due to high intensity of service, high risk for further deterioration and high frequency of surveillance required.*  Author: Floydene Flock, MD 04/18/2023 4:49 PM  For on call review [email or web address].

## 2023-04-18 NOTE — Assessment & Plan Note (Signed)
BP stable Titrate home regimen 

## 2023-04-18 NOTE — Assessment & Plan Note (Addendum)
Continue fenofibrate 

## 2023-04-18 NOTE — Telephone Encounter (Signed)
Pt called that she she having difficulty  breathing ,coughing and wheezing and pt already finished antibiotic and prednisone and also pt already to nebulizer every 4  hrs its not helping  as per de khan advised her to go to ED

## 2023-04-18 NOTE — ED Notes (Signed)
Pt used call bell as felt BiPAP mask wasn't situated on face quite right; machine stated leak was 50-80's; fixed mask for pt and now machine denies leak; fixed cardiac monitor as lead had come loose; pt requesting anxiety pill if available; will look into this for pt and notify primary RN; denies any other needs currently.

## 2023-04-18 NOTE — Assessment & Plan Note (Signed)
2D echo July 2021 with EF of around 65% and diastolic dysfunction Appears clinically dry to euvolemic Monitor volume status with treatment Follow

## 2023-04-18 NOTE — Assessment & Plan Note (Signed)
Acute decompensated asthma exacerbation with increased work of breathing, wheezing, cough Failed outpatient management with Levaquin and prednisone by [facility] pulmonology No hypoxia present IV Solu-Medrol IV Levaquin DuoNebs BiPAP as needed Formally consult [facility] pulmonology for recommendations Follow

## 2023-04-19 ENCOUNTER — Other Ambulatory Visit: Payer: Self-pay

## 2023-04-19 ENCOUNTER — Encounter: Payer: Self-pay | Admitting: Family Medicine

## 2023-04-19 DIAGNOSIS — F411 Generalized anxiety disorder: Secondary | ICD-10-CM

## 2023-04-19 DIAGNOSIS — J45901 Unspecified asthma with (acute) exacerbation: Secondary | ICD-10-CM | POA: Diagnosis not present

## 2023-04-19 DIAGNOSIS — E876 Hypokalemia: Secondary | ICD-10-CM

## 2023-04-19 LAB — COMPREHENSIVE METABOLIC PANEL
ALT: 15 U/L (ref 0–44)
AST: 31 U/L (ref 15–41)
Albumin: 4 g/dL (ref 3.5–5.0)
Alkaline Phosphatase: 42 U/L (ref 38–126)
Anion gap: 11 (ref 5–15)
BUN: 18 mg/dL (ref 8–23)
CO2: 22 mmol/L (ref 22–32)
Calcium: 9.8 mg/dL (ref 8.9–10.3)
Chloride: 105 mmol/L (ref 98–111)
Creatinine, Ser: 0.82 mg/dL (ref 0.44–1.00)
GFR, Estimated: >60 mL/min (ref >60)
Glucose, Bld: 116 mg/dL — ABNORMAL HIGH (ref 70–99)
Potassium: 3.4 mmol/L — ABNORMAL LOW (ref 3.5–5.1)
Sodium: 138 mmol/L (ref 135–145)
Total Bilirubin: 0.6 mg/dL (ref 0.3–1.2)
Total Protein: 7.2 g/dL (ref 6.5–8.1)

## 2023-04-19 LAB — CBC
HCT: 40.3 % (ref 36.0–46.0)
Hemoglobin: 13.6 g/dL (ref 12.0–15.0)
MCH: 31.3 pg (ref 26.0–34.0)
MCHC: 33.7 g/dL (ref 30.0–36.0)
MCV: 92.9 fL (ref 80.0–100.0)
Platelets: 300 10*3/uL (ref 150–400)
RBC: 4.34 MIL/uL (ref 3.87–5.11)
RDW: 12.3 % (ref 11.5–15.5)
WBC: 15.1 10*3/uL — ABNORMAL HIGH (ref 4.0–10.5)
nRBC: 0 % (ref 0.0–0.2)

## 2023-04-19 MED ORDER — LEVOFLOXACIN IN D5W 750 MG/150ML IV SOLN
750.0000 mg | INTRAVENOUS | Status: DC
  Administered 2023-04-19: 750 mg via INTRAVENOUS
  Filled 2023-04-19: qty 150

## 2023-04-19 MED ORDER — DOCUSATE SODIUM 100 MG PO CAPS
200.0000 mg | ORAL_CAPSULE | Freq: Two times a day (BID) | ORAL | Status: DC
  Administered 2023-04-19 – 2023-04-23 (×8): 200 mg via ORAL
  Filled 2023-04-19 (×10): qty 2

## 2023-04-19 MED ORDER — HYDROMORPHONE HCL 1 MG/ML IJ SOLN
1.0000 mg | INTRAMUSCULAR | Status: DC | PRN
  Administered 2023-04-19: 1 mg via INTRAVENOUS
  Filled 2023-04-19: qty 1

## 2023-04-19 MED ORDER — BENZONATATE 100 MG PO CAPS
200.0000 mg | ORAL_CAPSULE | Freq: Three times a day (TID) | ORAL | Status: DC | PRN
  Administered 2023-04-20 (×2): 200 mg via ORAL
  Filled 2023-04-19 (×2): qty 2

## 2023-04-19 MED ORDER — HYDROCODONE-ACETAMINOPHEN 5-325 MG PO TABS
1.0000 | ORAL_TABLET | Freq: Four times a day (QID) | ORAL | Status: DC | PRN
  Administered 2023-04-19 – 2023-04-24 (×8): 1 via ORAL
  Filled 2023-04-19 (×9): qty 1

## 2023-04-19 MED ORDER — IPRATROPIUM-ALBUTEROL 0.5-2.5 (3) MG/3ML IN SOLN
3.0000 mL | RESPIRATORY_TRACT | Status: DC | PRN
  Administered 2023-04-19 – 2023-04-24 (×12): 3 mL via RESPIRATORY_TRACT
  Filled 2023-04-19 (×15): qty 3

## 2023-04-19 MED ORDER — POTASSIUM CHLORIDE CRYS ER 20 MEQ PO TBCR
20.0000 meq | EXTENDED_RELEASE_TABLET | Freq: Once | ORAL | Status: AC
  Administered 2023-04-19: 20 meq via ORAL
  Filled 2023-04-19: qty 1

## 2023-04-19 MED ORDER — GUAIFENESIN 100 MG/5ML PO LIQD
5.0000 mL | ORAL | Status: DC | PRN
  Administered 2023-04-19 – 2023-04-24 (×10): 5 mL via ORAL
  Filled 2023-04-19 (×10): qty 10

## 2023-04-19 NOTE — Progress Notes (Addendum)
PROGRESS NOTE    Bonnie West  WUJ:[id number] DOB: [birth year] DOA: 04/18/2023 PCP: Sallyanne Kuster, NP    Assessment & Plan:   Principal Problem:   Asthma exacerbation Active Problems:   Hyperlipidemia, unspecified   Hypothyroidism   GAD (generalized anxiety disorder)   Essential hypertension, benign   Diastolic dysfunction  Assessment and Plan: Asthma exacerbation: unknown stage and/or severity. Continue on IV levaquin, steroids & bronchodilators. Encourage incentive spirometry.   Chronic diastolic CHF: appears euvolemic. Monitor I/Os    HTN: continue on lisinopril    Generalized anxiety disorder: unknown severity. Continue on home dose of fluoxetine, xanax    Hypothyroidism: continue on home dose of synthroid    HLD: continue on home dose of fenofibrate   Hypokalemia: potassium given    DVT prophylaxis: lovenox  Code Status: full  Family Communication:  Disposition Plan: likely d/c back home.  Level of care: Progressive  Status is: Inpatient Remains inpatient appropriate because: severity of illness   Consultants:  Pulmon   Procedures:   Antimicrobials: levaquin  Subjective: Pt c/o shortness of breath   Objective: Vitals:   04/19/23 0500 04/19/23 0600 04/19/23 0634 04/19/23 0700  BP: (!) 141/67 132/68  128/69  Pulse: 75   94  Resp: (!) 23   19  Temp:   98.5 F (36.9 C)   TempSrc:      SpO2: 95%   94%  Weight:      Height:        Intake/Output Summary (Last 24 hours) at 04/19/2023 0913 Last data filed at 04/19/2023 9562 Gross per 24 hour  Intake 150 ml  Output --  Net 150 ml   Filed Weights   04/18/23 1041  Weight: 59.9 kg    Examination:  General exam: Appears calm but  uncomfortable  Respiratory system: course breath sounds b/l  Cardiovascular system: S1 & S2 +. No  rubs, gallops or clicks Gastrointestinal system: Abdomen is nondistended, soft and nontender.  Normal bowel sounds heard. Central nervous system: Alert and  oriented. Moves all extremities  Psychiatry: Judgement and insight appear normal. Mood & affect appropriate.     Data Reviewed: I have personally reviewed following labs and imaging studies  CBC: Recent Labs  Lab 04/18/23 1044 04/19/23 0624  WBC 11.9* 15.1*  HGB 14.4 13.6  HCT 41.9 40.3  MCV 91.3 92.9  PLT 313 300   Basic Metabolic Panel: Recent Labs  Lab 04/18/23 1044 04/19/23 0624  NA 138 138  K 3.6 3.4*  CL 106 105  CO2 24 22  GLUCOSE 131* 116*  BUN 22 18  CREATININE 1.12* 0.82  CALCIUM 9.4 9.8   GFR: Estimated Creatinine Clearance: 50.7 mL/min (by C-G formula based on SCr of 0.82 mg/dL). Liver Function Tests: Recent Labs  Lab 04/19/23 0624  AST 31  ALT 15  ALKPHOS 42  BILITOT 0.6  PROT 7.2  ALBUMIN 4.0   No results for input(s): "LIPASE", "AMYLASE" in the last 168 hours. No results for input(s): "AMMONIA" in the last 168 hours. Coagulation Profile: No results for input(s): "INR", "PROTIME" in the last 168 hours. Cardiac Enzymes: No results for input(s): "CKTOTAL", "CKMB", "CKMBINDEX", "TROPONINI" in the last 168 hours. BNP (last 3 results) No results for input(s): "PROBNP" in the last 8760 hours. HbA1C: No results for input(s): "HGBA1C" in the last 72 hours. CBG: No results for input(s): "GLUCAP" in the last 168 hours. Lipid Profile: No results for input(s): "CHOL", "HDL", "LDLCALC", "TRIG", "CHOLHDL", "LDLDIRECT" in the last  72 hours. Thyroid Function Tests: No results for input(s): "TSH", "T4TOTAL", "FREET4", "T3FREE", "THYROIDAB" in the last 72 hours. Anemia Panel: No results for input(s): "VITAMINB12", "FOLATE", "FERRITIN", "TIBC", "IRON", "RETICCTPCT" in the last 72 hours. Sepsis Labs: No results for input(s): "PROCALCITON", "LATICACIDVEN" in the last 168 hours.  Recent Results (from the past 240 hour(s))  Resp panel by RT-PCR (RSV, Flu A&B, Covid) Anterior Nasal Swab     Status: None   Collection Time: 04/18/23  2:32 PM   Specimen:  Anterior Nasal Swab  Result Value Ref Range Status   SARS Coronavirus 2 by RT PCR NEGATIVE NEGATIVE Final    Comment: (NOTE) SARS-CoV-2 target nucleic acids are NOT DETECTED.  The SARS-CoV-2 RNA is generally detectable in upper respiratory specimens during the acute phase of infection. The lowest concentration of SARS-CoV-2 viral copies this assay can detect is 138 copies/mL. A negative result does not preclude SARS-Cov-2 infection and should not be used as the sole basis for treatment or other patient management decisions. A negative result may occur with  improper specimen collection/handling, submission of specimen other than nasopharyngeal swab, presence of viral mutation(s) within the areas targeted by this assay, and inadequate number of viral copies(<138 copies/mL). A negative result must be combined with clinical observations, patient history, and epidemiological information. The expected result is Negative.  Fact Sheet for Patients:  BloggerCourse.com  Fact Sheet for Healthcare Providers:  SeriousBroker.it  This test is no t yet approved or cleared by the [place] FDA and  has been authorized for detection and/or diagnosis of SARS-CoV-2 by FDA under an Emergency Use Authorization (EUA). This EUA will remain  in effect (meaning this test can be used) for the duration of the COVID-19 declaration under Section 564(b)(1) of the Act, 21 U.S.C.section 360bbb-3(b)(1), unless the authorization is terminated  or revoked sooner.       Influenza A by PCR NEGATIVE NEGATIVE Final   Influenza B by PCR NEGATIVE NEGATIVE Final    Comment: (NOTE) The Xpert Xpress SARS-CoV-2/FLU/RSV plus assay is intended as an aid in the diagnosis of influenza from Nasopharyngeal swab specimens and should not be used as a sole basis for treatment. Nasal washings and aspirates are unacceptable for Xpert Xpress SARS-CoV-2/FLU/RSV testing.  Fact  Sheet for Patients: BloggerCourse.com  Fact Sheet for Healthcare Providers: SeriousBroker.it  This test is not yet approved or cleared by the [place] FDA and has been authorized for detection and/or diagnosis of SARS-CoV-2 by FDA under an Emergency Use Authorization (EUA). This EUA will remain in effect (meaning this test can be used) for the duration of the COVID-19 declaration under Section 564(b)(1) of the Act, 21 U.S.C. section 360bbb-3(b)(1), unless the authorization is terminated or revoked.     Resp Syncytial Virus by PCR NEGATIVE NEGATIVE Final    Comment: (NOTE) Fact Sheet for Patients: BloggerCourse.com  Fact Sheet for Healthcare Providers: SeriousBroker.it  This test is not yet approved or cleared by the [place] FDA and has been authorized for detection and/or diagnosis of SARS-CoV-2 by FDA under an Emergency Use Authorization (EUA). This EUA will remain in effect (meaning this test can be used) for the duration of the COVID-19 declaration under Section 564(b)(1) of the Act, 21 U.S.C. section 360bbb-3(b)(1), unless the authorization is terminated or revoked.  Performed at [facility], [address] [place], [place] [address]          Radiology Studies: [facility] Chest [place] 1 View  Result Date: 04/18/2023 CLINICAL DATA:  One-week  history of shortness of breath EXAM: PORTABLE CHEST 1 VIEW COMPARISON:  Chest radiograph dated 04/02/2022 FINDINGS: Normal lung volumes. No focal consolidations. No pleural effusion or pneumothorax. The heart size and mediastinal contours are within normal limits. No acute osseous abnormality. IMPRESSION: No active disease. Electronically Signed   By: Agustin Cree M.D.   On: 04/18/2023 11:34        Scheduled Meds:  enoxaparin (LOVENOX) injection  40 mg Subcutaneous Q24H   fenofibrate  54 mg Oral Daily   FLUoxetine  40  mg Oral Daily   levothyroxine  100 mcg Oral Q0600   lisinopril  10 mg Oral Daily   LORazepam  0.5 mg Intravenous Once   montelukast  10 mg Oral Daily   pantoprazole  40 mg Oral Daily   predniSONE  40 mg Oral Q breakfast   Continuous Infusions:  levofloxacin (LEVAQUIN) IV Stopped (04/19/23 0227)     LOS: 1 day    Time spent: 35 mins     Charise Killian, MD [facility] Pager [phone]  If 7PM-7AM, please contact night-coverage [email or web address] 04/19/2023, 9:13 AM '

## 2023-04-19 NOTE — Progress Notes (Signed)
PHARMACY NOTE:  ANTIMICROBIAL RENAL DOSAGE ADJUSTMENT  Current antimicrobial regimen includes a mismatch between antimicrobial dosage and estimated renal function.  As per policy approved by the Pharmacy & Therapeutics and Medical Executive Committees, the antimicrobial dosage will be adjusted accordingly.  Current antimicrobial dosage:  Levaquin 750 mg IV q 48hrs  Indication: COPD/asthma exacerbation  Renal Function:  Estimated Creatinine Clearance: 50.7 mL/min (by C-G formula based on SCr of 0.82 mg/dL).    Antimicrobial dosage has been changed to:  Levaquin 750 mg IV q 24hrs  Additional comments:   Thank you for allowing pharmacy to be a part of this patient's care.  Paddy Neis Rodriguez-Guzman PharmD, BCPS 04/19/2023 1:43 PM

## 2023-04-19 NOTE — ED Notes (Addendum)
Pt requesting to come off Bipap to eat. Pt tolerating without difficulty. 02 sat maintaining 94-96% on RA

## 2023-04-19 NOTE — Progress Notes (Addendum)
Pt refused bed alarm and mats but was educated about its importance. Will continue to monitor.

## 2023-04-19 NOTE — ED Notes (Signed)
Declined gown, ambulated with steady gait unassisted to bedside toilet. Denies any request at this time. Pt states back is uncomfortable in bed but that is a chronic pain. Recliner chair provided.

## 2023-04-20 DIAGNOSIS — J441 Chronic obstructive pulmonary disease with (acute) exacerbation: Secondary | ICD-10-CM

## 2023-04-20 DIAGNOSIS — I10 Essential (primary) hypertension: Secondary | ICD-10-CM | POA: Diagnosis not present

## 2023-04-20 DIAGNOSIS — J9621 Acute and chronic respiratory failure with hypoxia: Secondary | ICD-10-CM

## 2023-04-20 DIAGNOSIS — I471 Supraventricular tachycardia, unspecified: Secondary | ICD-10-CM | POA: Diagnosis not present

## 2023-04-20 DIAGNOSIS — J45901 Unspecified asthma with (acute) exacerbation: Secondary | ICD-10-CM | POA: Diagnosis not present

## 2023-04-20 DIAGNOSIS — F411 Generalized anxiety disorder: Secondary | ICD-10-CM | POA: Diagnosis not present

## 2023-04-20 LAB — CBC
HCT: 38.4 % (ref 36.0–46.0)
Hemoglobin: 13 g/dL (ref 12.0–15.0)
MCH: 31.6 pg (ref 26.0–34.0)
MCHC: 33.9 g/dL (ref 30.0–36.0)
MCV: 93.2 fL (ref 80.0–100.0)
Platelets: 293 10*3/uL (ref 150–400)
RBC: 4.12 MIL/uL (ref 3.87–5.11)
RDW: 12.1 % (ref 11.5–15.5)
WBC: 13 10*3/uL — ABNORMAL HIGH (ref 4.0–10.5)
nRBC: 0 % (ref 0.0–0.2)

## 2023-04-20 LAB — BASIC METABOLIC PANEL
Anion gap: 8 (ref 5–15)
BUN: 23 mg/dL (ref 8–23)
CO2: 24 mmol/L (ref 22–32)
Calcium: 9.7 mg/dL (ref 8.9–10.3)
Chloride: 106 mmol/L (ref 98–111)
Creatinine, Ser: 0.88 mg/dL (ref 0.44–1.00)
GFR, Estimated: >60 mL/min (ref >60)
Glucose, Bld: 91 mg/dL (ref 70–99)
Potassium: 3.8 mmol/L (ref 3.5–5.1)
Sodium: 138 mmol/L (ref 135–145)

## 2023-04-20 MED ORDER — LEVOFLOXACIN 500 MG PO TABS
750.0000 mg | ORAL_TABLET | Freq: Every day | ORAL | Status: DC
  Administered 2023-04-20 – 2023-04-23 (×4): 750 mg via ORAL
  Filled 2023-04-20 (×4): qty 2

## 2023-04-20 MED ORDER — METHYLPREDNISOLONE SODIUM SUCC 40 MG IJ SOLR
40.0000 mg | Freq: Two times a day (BID) | INTRAMUSCULAR | Status: DC
  Administered 2023-04-20 – 2023-04-22 (×5): 40 mg via INTRAVENOUS
  Filled 2023-04-20 (×6): qty 1

## 2023-04-20 NOTE — Plan of Care (Signed)
  Problem: Health Behavior/Discharge Planning: Goal: Ability to manage health-related needs will improve Outcome: Progressing   Problem: Clinical Measurements: Goal: Respiratory complications will improve Outcome: Progressing   Problem: Clinical Measurements: Goal: Cardiovascular complication will be avoided Outcome: Progressing   Problem: Activity: Goal: Risk for activity intolerance will decrease Outcome: Progressing   Problem: Pain Managment: Goal: General experience of comfort will improve Outcome: Progressing   Problem: Safety: Goal: Ability to remain free from injury will improve Outcome: Progressing   

## 2023-04-20 NOTE — TOC CM/SW Note (Signed)
Transition of Care [facility]) - Inpatient Brief Assessment   Patient Details  Name: [place] MRN: [id number] Date of Birth: 1950-11-29  Transition of Care [facility]) CM/SW Contact:    Truddie Hidden, RN Phone Number: 04/20/2023, 10:26 AM   Clinical Narrative: TOC continues to provide ongoing assessment for needs and discharge planning.   Transition of Care Asessment: Insurance and Status: Insurance coverage has been reviewed Patient has primary care physician: Yes Home environment has been reviewed: Planning to return home Prior level of function:: Single family home Prior/Current Home Services: No current home services Social Determinants of Health Reivew: SDOH reviewed no interventions necessary Readmission risk has been reviewed: Yes Transition of care needs: no transition of care needs at this time

## 2023-04-20 NOTE — Progress Notes (Signed)
PHARMACIST - PHYSICIAN COMMUNICATION  CONCERNING: Antibiotic IV to Oral Route Change Policy  RECOMMENDATION: This patient is receiving levofloxacin by the intravenous route.  Based on criteria approved by the Pharmacy and Therapeutics Committee, the antibiotic(s) is/are being converted to the equivalent oral dose form(s).  DESCRIPTION: These criteria include: Patient being treated for a respiratory tract infection, urinary tract infection, cellulitis or clostridium difficile associated diarrhea if on metronidazole The patient is not neutropenic and does not exhibit a GI malabsorption state The patient is eating (either orally or via tube) and/or has been taking other orally administered medications for a least 24 hours The patient is improving clinically and has a Tmax < 100.5  If you have questions about this conversion, please contact the Pharmacy Department   Tressie Ellis 04/20/23

## 2023-04-20 NOTE — Progress Notes (Signed)
Pt refused bed alarm and floor mats but was educated about its importance. Will continue to monitor.

## 2023-04-20 NOTE — Progress Notes (Signed)
PROGRESS NOTE    [place]  ZOX:[id number] DOB: 1951/06/04 DOA: 04/18/2023 PCP: Sallyanne Kuster, NP    Assessment & Plan:   Principal Problem:   Asthma exacerbation Active Problems:   Hyperlipidemia, unspecified   Hypothyroidism   GAD (generalized anxiety disorder)   Essential hypertension, benign   Diastolic dysfunction  Assessment and Plan: Asthma exacerbation: unknown stage and/or severity. Respiratory status is unchanged from day prior. Continue on IV levaquin, steroids, & bronchodilators. Encourage incentive spirometry. Pulmon recs apprec   Chronic diastolic CHF: appears euvolemic. Monitor I/Os    HTN: continue on lisinopril    Generalized anxiety disorder: unknown severity. Continue on home dose of xanax, fluoxetine    Hypothyroidism: continue on home dose of levothyroxine    HLD: continue on home dose of fenofibrate   Hypokalemia: WNL today     DVT prophylaxis: lovenox  Code Status: full  Family Communication:  Disposition Plan: likely d/c back home.  Level of care: Progressive  Status is: Inpatient Remains inpatient appropriate because: severity of illness   Consultants:  Pulmon   Procedures:   Antimicrobials: levaquin  Subjective: Pt c/o shortness of breath still   Objective: Vitals:   04/19/23 2020 04/19/23 2317 04/20/23 0413 04/20/23 0804  BP: (!) 142/71 (!) 153/72 127/67 (!) 153/71  Pulse: 85 70 72 66  Resp: 18 18 18 16   Temp: 98.4 F (36.9 C) 97.8 F (36.6 C) 98.2 F (36.8 C) 98.4 F (36.9 C)  TempSrc: Oral Oral Oral   SpO2: 96% 97% 95% 98%  Weight:   62.5 kg   Height:        Intake/Output Summary (Last 24 hours) at 04/20/2023 0848 Last data filed at 04/20/2023 0539 Gross per 24 hour  Intake 456.01 ml  Output --  Net 456.01 ml   Filed Weights   04/18/23 1041 04/20/23 0413  Weight: 59.9 kg 62.5 kg    Examination:  General exam: Appears uncomfortable  Respiratory system: course breath sounds b/l. Wheezes b/l   Cardiovascular system: S1/S2+. No rubs or gallops  Gastrointestinal system: Abd is soft, NT, ND & hypoactive bowel sounds  Central nervous system: alert & oriented. Moves all extremities  Psychiatry: Judgement and insight appears at baseline. Flat mood and affect.     Data Reviewed: I have personally reviewed following labs and imaging studies  CBC: Recent Labs  Lab 04/18/23 1044 04/19/23 0624 04/20/23 0439  WBC 11.9* 15.1* 13.0*  HGB 14.4 13.6 13.0  HCT 41.9 40.3 38.4  MCV 91.3 92.9 93.2  PLT 313 300 293   Basic Metabolic Panel: Recent Labs  Lab 04/18/23 1044 04/19/23 0624 04/20/23 0439  NA 138 138 138  K 3.6 3.4* 3.8  CL 106 105 106  CO2 24 22 24   GLUCOSE 131* 116* 91  BUN 22 18 23   CREATININE 1.12* 0.82 0.88  CALCIUM 9.4 9.8 9.7   GFR: Estimated Creatinine Clearance: 48.3 mL/min (by C-G formula based on SCr of 0.88 mg/dL). Liver Function Tests: Recent Labs  Lab 04/19/23 0624  AST 31  ALT 15  ALKPHOS 42  BILITOT 0.6  PROT 7.2  ALBUMIN 4.0   No results for input(s): "LIPASE", "AMYLASE" in the last 168 hours. No results for input(s): "AMMONIA" in the last 168 hours. Coagulation Profile: No results for input(s): "INR", "PROTIME" in the last 168 hours. Cardiac Enzymes: No results for input(s): "CKTOTAL", "CKMB", "CKMBINDEX", "TROPONINI" in the last 168 hours. BNP (last 3 results) No results for input(s): "PROBNP" in the last  8760 hours. HbA1C: No results for input(s): "HGBA1C" in the last 72 hours. CBG: No results for input(s): "GLUCAP" in the last 168 hours. Lipid Profile: No results for input(s): "CHOL", "HDL", "LDLCALC", "TRIG", "CHOLHDL", "LDLDIRECT" in the last 72 hours. Thyroid Function Tests: No results for input(s): "TSH", "T4TOTAL", "FREET4", "T3FREE", "THYROIDAB" in the last 72 hours. Anemia Panel: No results for input(s): "VITAMINB12", "FOLATE", "FERRITIN", "TIBC", "IRON", "RETICCTPCT" in the last 72 hours. Sepsis Labs: No results for  input(s): "PROCALCITON", "LATICACIDVEN" in the last 168 hours.  Recent Results (from the past 240 hour(s))  Resp panel by RT-PCR (RSV, Flu A&B, Covid) Anterior Nasal Swab     Status: None   Collection Time: 04/18/23  2:32 PM   Specimen: Anterior Nasal Swab  Result Value Ref Range Status   SARS Coronavirus 2 by RT PCR NEGATIVE NEGATIVE Final    Comment: (NOTE) SARS-CoV-2 target nucleic acids are NOT DETECTED.  The SARS-CoV-2 RNA is generally detectable in upper respiratory specimens during the acute phase of infection. The lowest concentration of SARS-CoV-2 viral copies this assay can detect is 138 copies/mL. A negative result does not preclude SARS-Cov-2 infection and should not be used as the sole basis for treatment or other patient management decisions. A negative result may occur with  improper specimen collection/handling, submission of specimen other than nasopharyngeal swab, presence of viral mutation(s) within the areas targeted by this assay, and inadequate number of viral copies(<138 copies/mL). A negative result must be combined with clinical observations, patient history, and epidemiological information. The expected result is Negative.  Fact Sheet for Patients:  BloggerCourse.com  Fact Sheet for Healthcare Providers:  SeriousBroker.it  This test is no t yet approved or cleared by the [place] FDA and  has been authorized for detection and/or diagnosis of SARS-CoV-2 by FDA under an Emergency Use Authorization (EUA). This EUA will remain  in effect (meaning this test can be used) for the duration of the COVID-19 declaration under Section 564(b)(1) of the Act, 21 U.S.C.section 360bbb-3(b)(1), unless the authorization is terminated  or revoked sooner.       Influenza A by PCR NEGATIVE NEGATIVE Final   Influenza B by PCR NEGATIVE NEGATIVE Final    Comment: (NOTE) The Xpert Xpress SARS-CoV-2/FLU/RSV plus assay is  intended as an aid in the diagnosis of influenza from Nasopharyngeal swab specimens and should not be used as a sole basis for treatment. Nasal washings and aspirates are unacceptable for Xpert Xpress SARS-CoV-2/FLU/RSV testing.  Fact Sheet for Patients: BloggerCourse.com  Fact Sheet for Healthcare Providers: SeriousBroker.it  This test is not yet approved or cleared by the [place] FDA and has been authorized for detection and/or diagnosis of SARS-CoV-2 by FDA under an Emergency Use Authorization (EUA). This EUA will remain in effect (meaning this test can be used) for the duration of the COVID-19 declaration under Section 564(b)(1) of the Act, 21 U.S.C. section 360bbb-3(b)(1), unless the authorization is terminated or revoked.     Resp Syncytial Virus by PCR NEGATIVE NEGATIVE Final    Comment: (NOTE) Fact Sheet for Patients: BloggerCourse.com  Fact Sheet for Healthcare Providers: SeriousBroker.it  This test is not yet approved or cleared by the [place] FDA and has been authorized for detection and/or diagnosis of SARS-CoV-2 by FDA under an Emergency Use Authorization (EUA). This EUA will remain in effect (meaning this test can be used) for the duration of the COVID-19 declaration under Section 564(b)(1) of the Act, 21 U.S.C. section 360bbb-3(b)(1), unless the authorization is terminated  or revoked.  Performed at [facility], [address], [place], [place] [address]          Radiology Studies: [facility] Chest [place] 1 View  Result Date: 04/18/2023 CLINICAL DATA:  One-week history of shortness of breath EXAM: PORTABLE CHEST 1 VIEW COMPARISON:  Chest radiograph dated 04/02/2022 FINDINGS: Normal lung volumes. No focal consolidations. No pleural effusion or pneumothorax. The heart size and mediastinal contours are within normal limits. No acute osseous  abnormality. IMPRESSION: No active disease. Electronically Signed   By: Agustin Cree M.D.   On: 04/18/2023 11:34        Scheduled Meds:  docusate sodium  200 mg Oral BID   enoxaparin (LOVENOX) injection  40 mg Subcutaneous Q24H   fenofibrate  54 mg Oral Daily   FLUoxetine  40 mg Oral Daily   levothyroxine  100 mcg Oral Q0600   lisinopril  10 mg Oral Daily   LORazepam  0.5 mg Intravenous Once   montelukast  10 mg Oral Daily   pantoprazole  40 mg Oral Daily   predniSONE  40 mg Oral Q breakfast   Continuous Infusions:  levofloxacin (LEVAQUIN) IV Stopped (04/20/23 0059)     LOS: 2 days    Time spent: 35 mins     Charise Killian, MD [facility] Pager [phone]  If 7PM-7AM, please contact night-coverage [email or web address] 04/20/2023, 8:48 AM '

## 2023-04-20 NOTE — Consult Note (Signed)
Pulmonary Critical Care  Initial Consult Note  [place] West:[id number] DOB: [birth year] DOA: 04/18/2023  Referring physician: Dr. Alvester Morin  Chief Complaint: Exacerbation of asthma  HPI: [place] is a 72 y.o. female with a longstanding history of anxiety asthma COPD came into the office because of increasing shortness of breath.  She was noted to have increased work of breathing it was felt best to admit the patient to the hospital.  Patient was sent directly to the hospital and since then she has had a rocky course.  She has been somewhat hypoxic but the day that she was seen she was actually on room air.  It should be noted that she does also have paradoxical cord movement and a lot of anxiety component to her symptoms.  Patient has been having cough shortness of breath congestion.  Review of Systems:  Constitutional:  No weight loss, night sweats, Fevers, chills, fatigue.  HEENT:  No headaches, nasal congestion, post nasal drip,  Cardio-vascular:  No chest pain, Orthopnea, PND, swelling in lower extremities, anasarca, dizziness, palpitations  GI:  No heartburn, indigestion, abdominal pain, nausea, vomiting, diarrhea  Resp:  No shortness of breath with exertion or at rest. no productive cough, No coughing up of blood.No wheezing Skin:  no rash or lesions.  Musculoskeletal:  No joint pain or swelling.   Remainder ROS performed and is unremarkable other than noted in HPI  Past Medical History:  Diagnosis Date   Allergic rhinitis    Anxiety    Asthma    Blood transfusion without reported diagnosis    Depression    Diverticulosis    Dyspnea    Esophagitis    GERD (gastroesophageal reflux disease)    Headache    Heart murmur    Hyperlipemia    Hypertension    Hypothyroidism    Obesity    Osteopenia    Palpitations    Pneumothorax    Sleep apnea    Past Surgical History:  Procedure Laterality Date   BUNIONECTOMY     CATARACT EXTRACTION W/PHACO Right 10/11/2016    Procedure: CATARACT EXTRACTION PHACO AND INTRAOCULAR LENS PLACEMENT (IOC);  Surgeon: Galen Manila, MD;  Location: [facility] ORS;  Service: Ophthalmology;  Laterality: Right;  Lot# [id number] H [place]: 00:37.7 AP%: 18.1 CDE: 6.80   CATARACT EXTRACTION W/PHACO Left 11/08/2016   Procedure: CATARACT EXTRACTION PHACO AND INTRAOCULAR LENS PLACEMENT (IOC);  Surgeon: Galen Manila, MD;  Location: [facility] ORS;  Service: Ophthalmology;  Laterality: Left;  PACK LOT: [id number] H US:00:32 AP:44 CDE:6.46   COLONOSCOPY     COLONOSCOPY WITH PROPOFOL N/A 11/26/2015   Procedure: COLONOSCOPY WITH PROPOFOL;  Surgeon: Scot Jun, MD;  Location: [facility] ENDOSCOPY;  Service: Endoscopy;  Laterality: N/A;   EYE SURGERY  07/23/2021   EYE SURGERY Left 09/24/2021   strabusmis, double vision   TONSILLECTOMY     Social History:  reports that she has quit smoking. She has never used smokeless tobacco. She reports that she does not drink alcohol and does not use drugs.  Allergies  Allergen Reactions   Advair Diskus [Fluticasone-Salmeterol] Other (See Comments)    Causes asthma attack    Codeine    Erythromycin Other (See Comments)    Stomach pain   Morphine And Codeine Itching and Nausea And Vomiting   Nsaids    Sulfa Antibiotics Other (See Comments)    unknown    Family History  Problem Relation Age of Onset   Breast cancer Maternal Grandmother    Stroke  Maternal Grandmother    Bladder Cancer Father    Prostate cancer Father    Heart attack Father    Aortic aneurysm Father    Congestive Heart Failure Father    Colon cancer Paternal Grandmother    Aneurysm Paternal Grandmother    Stroke Paternal Grandmother    Congestive Heart Failure Paternal Grandmother    Dementia Mother    Stroke Mother    Dementia Maternal Grandfather    Stroke Maternal Grandfather    Dementia Paternal Grandfather    Stroke Paternal Grandfather     Prior to Admission medications   Medication Sig Start Date End Date Taking?  Authorizing Provider  acetaminophen (TYLENOL) 500 MG tablet Take 1,000 mg by mouth 2 (two) times daily as needed for mild pain or moderate pain.   Yes [provider]  albuterol (VENTOLIN HFA) 108 (90 Base) MCG/ACT inhaler Inhale 1-2 puffs into the lungs every 4 (four) hours as needed for wheezing or shortness of breath. 02/09/23  Yes Abernathy, Alyssa, NP  ALPRAZolam (XANAX) 0.25 MG tablet Take 1 tablet (0.25 mg total) by mouth 2 (two) times daily as needed for anxiety. 01/30/23  Yes Abernathy, Alyssa, NP  baclofen (LIORESAL) 10 MG tablet TAKE 1 TABLET BY MOUTH TWICE A DAY AS NEEDED FOR MUSCLE SPASMS 11/08/22  Yes Abernathy, Alyssa, NP  bisacodyl (DULCOLAX) 5 MG EC tablet Take 5-10 mg by mouth at bedtime as needed for moderate constipation. Two at night   Yes [provider]  cetirizine (ZYRTEC) 10 MG tablet Take 10 mg by mouth daily.   Yes [provider]  cholecalciferol (VITAMIN D) 1000 units tablet Take 1,000 Units by mouth at bedtime.   Yes [provider]  clindamycin (CLEOCIN T) 1 % external solution Apply 1 Application topically 2 (two) times daily as needed. For skin breakdown 11/08/22  Yes Abernathy, Alyssa, NP  cyclobenzaprine (FLEXERIL) 5 MG tablet Take 1-2 tablets (5-10 mg total) by mouth at bedtime. 01/30/23  Yes Abernathy, Arlyss Repress, NP  docusate sodium (COLACE) 100 MG capsule Take 100-200 mg by mouth at bedtime as needed for mild constipation or moderate constipation.    Yes [provider]  fenofibrate (TRICOR) 145 MG tablet Take 1 tablet (145 mg total) by mouth daily. 11/08/22  Yes Abernathy, Arlyss Repress, NP  FLUoxetine (PROZAC) 40 MG capsule Take 1 capsule (40 mg total) by mouth daily. 11/08/22  Yes Sallyanne Kuster, NP  fluticasone (FLOVENT HFA) 110 MCG/ACT inhaler USE 2 PUFFS INHALED TWICE DAILY 02/09/23  Yes Yevonne Pax, MD  HYDROcodone-acetaminophen (NORCO/VICODIN) 5-325 MG tablet Take 1 tablet by mouth daily as needed for severe pain.  01/30/23  Yes Abernathy, Arlyss Repress, NP  ipratropium (ATROVENT HFA) 17 MCG/ACT inhaler INHALE 2 PUFFS 4 TIMES DAILY 02/09/23  Yes Freda Munro A, MD  ipratropium-albuterol (DUONEB) 0.5-2.5 (3) MG/3ML SOLN Take 3 mLs by nebulization every 4 (four) hours as needed (SOB, wheezing). 11/08/22  Yes Abernathy, Arlyss Repress, NP  isometheptene-acetaminophen-dichloralphenazone (MIDRIN) 65-100-325 MG capsule Take 1 capsule by mouth 4 (four) times daily as needed for migraine. Maximum 5 capsules in 12 hours for migraine headaches, 8 capsules in 24 hours for tension headaches.   Yes [provider]  levothyroxine (SYNTHROID) 100 MCG tablet TAKE 1 TABLET BY MOUTH EVERY DAY IN THE MORNING 01/30/23  Yes Abernathy, Alyssa, NP  lisinopril (ZESTRIL) 10 MG tablet Take 1 tablet (10 mg total) by mouth daily. 01/30/23  Yes Abernathy, Arlyss Repress, NP  magnesium oxide (MAG-OX) 400 MG tablet Take 400 mg  by mouth at bedtime.    Yes [provider]  montelukast (SINGULAIR) 10 MG tablet Take 1 tablet (10 mg total) by mouth daily. 08/04/22  Yes Abernathy, Arlyss Repress, NP  omeprazole (PRILOSEC) 40 MG capsule TAKE 1 CAPSULE BY MOUTH EVERY DAY 05/26/22  Yes Abernathy, Alyssa, NP  polyethylene glycol powder (GLYCOLAX/MIRALAX) 17 GM/SCOOP powder Take 17 g by mouth every evening. 07/15/19  Yes Boscia, Heather E, NP  triamcinolone cream (KENALOG) 0.1 % Apply 1 application topically 2 (two) times daily as needed. For skin breakdown   Yes [provider]  vitamin B-12 (CYANOCOBALAMIN) 1000 MCG tablet Take 1,000 mcg by mouth daily.   Yes [provider]  zolpidem (AMBIEN) 5 MG tablet Take 0.5-1 tablets (2.5-5 mg total) by mouth at bedtime as needed for sleep. 11/08/22  Yes Abernathy, Alyssa, NP  estradiol (ESTRACE) 0.1 MG/GM vaginal cream INSERT 1 GRAM VAGINALLY TWICE WEEKLY AT BEDTIME Patient not taking: Reported on 04/18/2023 08/20/20   Carlean Jews, NP  ezetimibe (ZETIA) 10 MG tablet Take 1 tablet by mouth daily. Patient not  taking: Reported on 04/18/2023 03/28/23 03/27/24  [provider]  levofloxacin (LEVAQUIN) 750 MG tablet Take 1 tablet (750 mg total) by mouth daily. Patient not taking: Reported on 04/18/2023 04/11/23   Yevonne Pax, MD  predniSONE (STERAPRED UNI-PAK 21 TAB) 10 MG (21) TBPK tablet Take one prdnisone dose pack for 7 days taper Patient not taking: Reported on 04/18/2023 04/11/23   Yevonne Pax, MD  promethazine-dextromethorphan (PROMETHAZINE-DM) 6.25-15 MG/5ML syrup Take 5 mLs by mouth 4 (four) times daily as needed. Patient not taking: Reported on 04/18/2023 10/22/22   Katha Cabal, DO   Physical Exam: Vitals:   04/20/23 0413 04/20/23 0804 04/20/23 1048 04/20/23 1131  BP: 127/67 (!) 153/71  136/79  Pulse: 72 66  80  Resp: 18 16  16   Temp: 98.2 F (36.8 C) 98.4 F (36.9 C)  98.3 F (36.8 C)  TempSrc: Oral     SpO2: 95% 98% 97% 97%  Weight: 62.5 kg     Height:       Body mass index is 26.03 kg/m.   Wt Readings from Last 3 Encounters:  04/20/23 62.5 kg  04/11/23 63.8 kg  02/09/23 64.5 kg    General:  Appears calm and comfortable Eyes: PERRL, normal lids, irises & conjunctiva ENT: grossly normal hearing, lips & tongue Neck: no LAD, masses or thyromegaly Cardiovascular: RRR, no m/r/g. No LE edema. Respiratory: CTA bilaterally, no w/r/r.       Normal respiratory effort. Abdomen: soft, nontender Skin: no rash or induration seen on limited exam Musculoskeletal: grossly normal tone BUE/BLE Psychiatric: grossly normal mood and affect Neurologic: grossly non-focal.          Labs on Admission:  Basic Metabolic Panel: Recent Labs  Lab 04/18/23 1044 04/19/23 0624 04/20/23 0439  NA 138 138 138  K 3.6 3.4* 3.8  CL 106 105 106  CO2 24 22 24   GLUCOSE 131* 116* 91  BUN 22 18 23   CREATININE 1.12* 0.82 0.88  CALCIUM 9.4 9.8 9.7   Liver Function Tests: Recent Labs  Lab 04/19/23 0624  AST 31  ALT 15  ALKPHOS 42  BILITOT 0.6  PROT 7.2  ALBUMIN 4.0   No results  for input(s): "LIPASE", "AMYLASE" in the last 168 hours. No results for input(s): "AMMONIA" in the last 168 hours. CBC: Recent Labs  Lab 04/18/23 1044 04/19/23 0624 04/20/23 0439  WBC 11.9* 15.1* 13.0*  HGB 14.4 13.6 13.0  HCT 41.9 40.3 38.4  MCV 91.3 92.9 93.2  PLT 313 300 293   Cardiac Enzymes: No results for input(s): "CKTOTAL", "CKMB", "CKMBINDEX", "TROPONINI" in the last 168 hours.  BNP (last 3 results) No results for input(s): "BNP" in the last 8760 hours.  ProBNP (last 3 results) No results for input(s): "PROBNP" in the last 8760 hours.  CBG: No results for input(s): "GLUCAP" in the last 168 hours.  Radiological Exams on Admission: No results found.  EKG: Independently reviewed.  Assessment/Plan Principal Problem:   Asthma exacerbation Active Problems:   Hyperlipidemia, unspecified   Hypothyroidism   GAD (generalized anxiety disorder)   Essential hypertension, benign   Diastolic dysfunction   Acute on chronic respiratory failure with hypoxia the patient is doing well now is off of oxygen plan is going to be needed will continue to titrate oxygen down as tolerated.  Will continue secretion management pulmonary toilet. Anxiety disorder continue with routine medical management. COPD slow improvement continue with steroids and inhalers nebulizers. Once stabilized discharged back to the office will follow-up.  Code Status: Full code  Family Communication: No family present Disposition Plan: Home  Time spent: 70 minutes  I have personally obtained a history, examined the patient, evaluated laboratory and imaging results, formulated the assessment and plan and placed orders.  The Patient requires high complexity decision making for assessment and support. Total Time Spent   Yevonne Pax, MD [facility] Pulmonary Critical Care Medicine Sleep MedicinePulmonary Critical Care  Initial Consult Note

## 2023-04-21 DIAGNOSIS — I10 Essential (primary) hypertension: Secondary | ICD-10-CM | POA: Diagnosis not present

## 2023-04-21 DIAGNOSIS — F411 Generalized anxiety disorder: Secondary | ICD-10-CM | POA: Diagnosis not present

## 2023-04-21 DIAGNOSIS — J45901 Unspecified asthma with (acute) exacerbation: Secondary | ICD-10-CM | POA: Diagnosis not present

## 2023-04-21 LAB — BASIC METABOLIC PANEL
Anion gap: 10 (ref 5–15)
BUN: 31 mg/dL — ABNORMAL HIGH (ref 8–23)
CO2: 22 mmol/L (ref 22–32)
Calcium: 10.2 mg/dL (ref 8.9–10.3)
Chloride: 103 mmol/L (ref 98–111)
Creatinine, Ser: 0.98 mg/dL (ref 0.44–1.00)
GFR, Estimated: >60 mL/min (ref >60)
Glucose, Bld: 130 mg/dL — ABNORMAL HIGH (ref 70–99)
Potassium: 3.8 mmol/L (ref 3.5–5.1)
Sodium: 135 mmol/L (ref 135–145)

## 2023-04-21 LAB — CBC
HCT: 40.6 % (ref 36.0–46.0)
Hemoglobin: 13.7 g/dL (ref 12.0–15.0)
MCH: 31.1 pg (ref 26.0–34.0)
MCHC: 33.7 g/dL (ref 30.0–36.0)
MCV: 92.3 fL (ref 80.0–100.0)
Platelets: 345 10*3/uL (ref 150–400)
RBC: 4.4 MIL/uL (ref 3.87–5.11)
RDW: 12.1 % (ref 11.5–15.5)
WBC: 13.7 10*3/uL — ABNORMAL HIGH (ref 4.0–10.5)
nRBC: 0 % (ref 0.0–0.2)

## 2023-04-21 MED ORDER — ALPRAZOLAM 0.25 MG PO TABS
0.2500 mg | ORAL_TABLET | Freq: Once | ORAL | Status: AC
  Administered 2023-04-21: 0.25 mg via ORAL
  Filled 2023-04-21: qty 1

## 2023-04-21 MED ORDER — POLYETHYLENE GLYCOL 3350 17 G PO PACK
17.0000 g | PACK | Freq: Every day | ORAL | Status: DC
  Administered 2023-04-21 – 2023-04-23 (×2): 17 g via ORAL
  Filled 2023-04-21 (×4): qty 1

## 2023-04-21 NOTE — Progress Notes (Signed)
PROGRESS NOTE    Bonnie West  ZOX:[id number] DOB: 01-May-1951 DOA: 04/18/2023 PCP: Sallyanne Kuster, NP    Assessment & Plan:   Principal Problem:   Asthma exacerbation Active Problems:   Hyperlipidemia, unspecified   Hypothyroidism   GAD (generalized anxiety disorder)   Essential hypertension, benign   Diastolic dysfunction  Assessment and Plan: Asthma exacerbation: unknown stage and/or severity. Resp status slightly improved from day prior. Continue on IV levaquin, steroids, & bronchodilators. Encourage incentive spirometry. Pulmon recs apprec   Chronic diastolic CHF: appears euvolemic. Monitor I/Os    HTN: continue on lisinopril    Generalized anxiety disorder: unknown severity. Continue on home dose of xanax, fluoxetine    Hypothyroidism: continue on home dose of levothyroxine    HLD: continue on home dose of fenofibrate  Hypokalemia: WNL     DVT prophylaxis: lovenox  Code Status: full  Family Communication:  Disposition Plan: likely d/c back home.  Level of care: Progressive  Status is: Inpatient Remains inpatient appropriate because: severity of illness, resp status slightly improved today, possible d/c home tomorrow. Will benefit of another on IV steroids & abxs    Consultants:  Pulmon   Procedures:   Antimicrobials: levaquin  Subjective: Pt c/o cough & intermittent shortness of breath   Objective: Vitals:   04/20/23 2105 04/21/23 0050 04/21/23 0450 04/21/23 0500  BP:  124/73 137/71   Pulse:  78 82   Resp:  20 20   Temp:  97.8 F (36.6 C) (!) 97.4 F (36.3 C) 97.7 F (36.5 C)  TempSrc:    Oral  SpO2: 99% 94% 94%   Weight:      Height:        Intake/Output Summary (Last 24 hours) at 04/21/2023 0803 Last data filed at 04/21/2023 0025 Gross per 24 hour  Intake 1197 ml  Output --  Net 1197 ml   Filed Weights   04/18/23 1041 04/20/23 0413  Weight: 59.9 kg 62.5 kg    Examination:  General exam: appears uncomfortable  Respiratory  system: course breath sounds b/l. Scattered wheezes b/l Cardiovascular system: S1 & S2+. No rubs or clicks  Gastrointestinal system: abd is soft, NT, ND & normal bowel sounds   Central nervous system: alert & oriented. Moves all extremities  Psychiatry: Judgement and insight appears at baseline. Anxious mood and affect    Data Reviewed: I have personally reviewed following labs and imaging studies  CBC: Recent Labs  Lab 04/18/23 1044 04/19/23 0624 04/20/23 0439 04/21/23 0521  WBC 11.9* 15.1* 13.0* 13.7*  HGB 14.4 13.6 13.0 13.7  HCT 41.9 40.3 38.4 40.6  MCV 91.3 92.9 93.2 92.3  PLT 313 300 293 345   Basic Metabolic Panel: Recent Labs  Lab 04/18/23 1044 04/19/23 0624 04/20/23 0439 04/21/23 0521  NA 138 138 138 135  K 3.6 3.4* 3.8 3.8  CL 106 105 106 103  CO2 24 22 24 22   GLUCOSE 131* 116* 91 130*  BUN 22 18 23  31*  CREATININE 1.12* 0.82 0.88 0.98  CALCIUM 9.4 9.8 9.7 10.2   GFR: Estimated Creatinine Clearance: 43.3 mL/min (by C-G formula based on SCr of 0.98 mg/dL). Liver Function Tests: Recent Labs  Lab 04/19/23 0624  AST 31  ALT 15  ALKPHOS 42  BILITOT 0.6  PROT 7.2  ALBUMIN 4.0   No results for input(s): "LIPASE", "AMYLASE" in the last 168 hours. No results for input(s): "AMMONIA" in the last 168 hours. Coagulation Profile: No results for input(s): "INR", "PROTIME"  in the last 168 hours. Cardiac Enzymes: No results for input(s): "CKTOTAL", "CKMB", "CKMBINDEX", "TROPONINI" in the last 168 hours. BNP (last 3 results) No results for input(s): "PROBNP" in the last 8760 hours. HbA1C: No results for input(s): "HGBA1C" in the last 72 hours. CBG: No results for input(s): "GLUCAP" in the last 168 hours. Lipid Profile: No results for input(s): "CHOL", "HDL", "LDLCALC", "TRIG", "CHOLHDL", "LDLDIRECT" in the last 72 hours. Thyroid Function Tests: No results for input(s): "TSH", "T4TOTAL", "FREET4", "T3FREE", "THYROIDAB" in the last 72 hours. Anemia Panel: No  results for input(s): "VITAMINB12", "FOLATE", "FERRITIN", "TIBC", "IRON", "RETICCTPCT" in the last 72 hours. Sepsis Labs: No results for input(s): "PROCALCITON", "LATICACIDVEN" in the last 168 hours.  Recent Results (from the past 240 hour(s))  Resp panel by RT-PCR (RSV, Flu A&B, Covid) Anterior Nasal Swab     Status: None   Collection Time: 04/18/23  2:32 PM   Specimen: Anterior Nasal Swab  Result Value Ref Range Status   SARS Coronavirus 2 by RT PCR NEGATIVE NEGATIVE Final    Comment: (NOTE) SARS-CoV-2 target nucleic acids are NOT DETECTED.  The SARS-CoV-2 RNA is generally detectable in upper respiratory specimens during the acute phase of infection. The lowest concentration of SARS-CoV-2 viral copies this assay can detect is 138 copies/mL. A negative result does not preclude SARS-Cov-2 infection and should not be used as the sole basis for treatment or other patient management decisions. A negative result may occur with  improper specimen collection/handling, submission of specimen other than nasopharyngeal swab, presence of viral mutation(s) within the areas targeted by this assay, and inadequate number of viral copies(<138 copies/mL). A negative result must be combined with clinical observations, patient history, and epidemiological information. The expected result is Negative.  Fact Sheet for Patients:  BloggerCourse.com  Fact Sheet for Healthcare Providers:  SeriousBroker.it  This test is no t yet approved or cleared by the [place] FDA and  has been authorized for detection and/or diagnosis of SARS-CoV-2 by FDA under an Emergency Use Authorization (EUA). This EUA will remain  in effect (meaning this test can be used) for the duration of the COVID-19 declaration under Section 564(b)(1) of the Act, 21 U.S.C.section 360bbb-3(b)(1), unless the authorization is terminated  or revoked sooner.       Influenza A by PCR  NEGATIVE NEGATIVE Final   Influenza B by PCR NEGATIVE NEGATIVE Final    Comment: (NOTE) The Xpert Xpress SARS-CoV-2/FLU/RSV plus assay is intended as an aid in the diagnosis of influenza from Nasopharyngeal swab specimens and should not be used as a sole basis for treatment. Nasal washings and aspirates are unacceptable for Xpert Xpress SARS-CoV-2/FLU/RSV testing.  Fact Sheet for Patients: BloggerCourse.com  Fact Sheet for Healthcare Providers: SeriousBroker.it  This test is not yet approved or cleared by the [place] FDA and has been authorized for detection and/or diagnosis of SARS-CoV-2 by FDA under an Emergency Use Authorization (EUA). This EUA will remain in effect (meaning this test can be used) for the duration of the COVID-19 declaration under Section 564(b)(1) of the Act, 21 U.S.C. section 360bbb-3(b)(1), unless the authorization is terminated or revoked.     Resp Syncytial Virus by PCR NEGATIVE NEGATIVE Final    Comment: (NOTE) Fact Sheet for Patients: BloggerCourse.com  Fact Sheet for Healthcare Providers: SeriousBroker.it  This test is not yet approved or cleared by the [place] FDA and has been authorized for detection and/or diagnosis of SARS-CoV-2 by FDA under an Emergency Use Authorization (EUA). This EUA  will remain in effect (meaning this test can be used) for the duration of the COVID-19 declaration under Section 564(b)(1) of the Act, 21 U.S.C. section 360bbb-3(b)(1), unless the authorization is terminated or revoked.  Performed at [facility], [address], [place], [place] [address]          Radiology Studies: No results found.      Scheduled Meds:  docusate sodium  200 mg Oral BID   enoxaparin (LOVENOX) injection  40 mg Subcutaneous Q24H   fenofibrate  54 mg Oral Daily   FLUoxetine  40 mg Oral Daily   levofloxacin   750 mg Oral Daily   levothyroxine  100 mcg Oral Q0600   lisinopril  10 mg Oral Daily   LORazepam  0.5 mg Intravenous Once   methylPREDNISolone (SOLU-MEDROL) injection  40 mg Intravenous Q12H   montelukast  10 mg Oral Daily   pantoprazole  40 mg Oral Daily   Continuous Infusions:     LOS: 3 days    Time spent: 25 mins     Charise Killian, MD [facility] Pager [phone]  If 7PM-7AM, please contact night-coverage [email or web address] 04/21/2023, 8:03 AM '

## 2023-04-21 NOTE — Care Management Important Message (Signed)
Important Message  Patient Details  Name: Bonnie West MRN: [id number] Date of Birth: [birth year]   Medicare Important Message Given:  N/A - LOS <3 / Initial given by admissions     [name] 04/21/2023, 8:56 AM

## 2023-04-22 DIAGNOSIS — J45901 Unspecified asthma with (acute) exacerbation: Secondary | ICD-10-CM | POA: Diagnosis not present

## 2023-04-22 DIAGNOSIS — F411 Generalized anxiety disorder: Secondary | ICD-10-CM | POA: Diagnosis not present

## 2023-04-22 DIAGNOSIS — I10 Essential (primary) hypertension: Secondary | ICD-10-CM | POA: Diagnosis not present

## 2023-04-22 LAB — CBC
HCT: 40.4 % (ref 36.0–46.0)
Hemoglobin: 13.7 g/dL (ref 12.0–15.0)
MCH: 31.1 pg (ref 26.0–34.0)
MCHC: 33.9 g/dL (ref 30.0–36.0)
MCV: 91.8 fL (ref 80.0–100.0)
Platelets: 336 10*3/uL (ref 150–400)
RBC: 4.4 MIL/uL (ref 3.87–5.11)
RDW: 12.2 % (ref 11.5–15.5)
WBC: 13.8 10*3/uL — ABNORMAL HIGH (ref 4.0–10.5)
nRBC: 0 % (ref 0.0–0.2)

## 2023-04-22 LAB — BASIC METABOLIC PANEL
Anion gap: 9 (ref 5–15)
BUN: 34 mg/dL — ABNORMAL HIGH (ref 8–23)
CO2: 23 mmol/L (ref 22–32)
Calcium: 10.3 mg/dL (ref 8.9–10.3)
Chloride: 102 mmol/L (ref 98–111)
Creatinine, Ser: 0.92 mg/dL (ref 0.44–1.00)
GFR, Estimated: >60 mL/min (ref >60)
Glucose, Bld: 126 mg/dL — ABNORMAL HIGH (ref 70–99)
Potassium: 4.5 mmol/L (ref 3.5–5.1)
Sodium: 134 mmol/L — ABNORMAL LOW (ref 135–145)

## 2023-04-22 MED ORDER — METHYLPREDNISOLONE SODIUM SUCC 125 MG IJ SOLR: 60.0000 mg | Freq: Two times a day (BID) | INTRAMUSCULAR | Status: DC

## 2023-04-22 MED ORDER — ALUM & MAG HYDROXIDE-SIMETH 200-200-20 MG/5ML PO SUSP
30.0000 mL | ORAL | Status: DC | PRN
  Administered 2023-04-22: 30 mL via ORAL
  Filled 2023-04-22: qty 30

## 2023-04-22 MED ORDER — METHYLPREDNISOLONE SODIUM SUCC 125 MG IJ SOLR
120.0000 mg | INTRAMUSCULAR | Status: DC
  Administered 2023-04-22 – 2023-04-23 (×2): 120 mg via INTRAVENOUS
  Filled 2023-04-22 (×2): qty 2

## 2023-04-22 NOTE — Plan of Care (Signed)

## 2023-04-22 NOTE — Progress Notes (Addendum)
PROGRESS NOTE    Bonnie West  WUJ:[id number] DOB: Apr 01, 1951 DOA: 04/18/2023 PCP: Sallyanne Kuster, NP    Assessment & Plan:   Principal Problem:   Asthma exacerbation Active Problems:   Hyperlipidemia, unspecified   Hypothyroidism   GAD (generalized anxiety disorder)   Essential hypertension, benign   Diastolic dysfunction  Assessment and Plan: Asthma exacerbation: unknown stage and/or severity. Resp status unchanged from day prior.  Continue on levaquin & bronchodilators. Increased IV steroids today. Encourage incentive spirometry   Chronic diastolic CHF: appears euvolemic. Monitor I/Os.    HTN: continue on lisinopril    Generalized anxiety disorder: unknown severity. Continue on home dose of fluoxetine, xanax   Hypothyroidism: continue on home dose of levothyroxine    HLD: continue on home dose of fenofibrate  Hypokalemia: WNL today     DVT prophylaxis: lovenox  Code Status: full  Family Communication:  Disposition Plan: likely d/c back home.  Level of care: Progressive  Status is: Inpatient Remains inpatient appropriate because: severity of illness- increased IV steroid dose today    Consultants:  Pulmon   Procedures:   Antimicrobials: levaquin  Subjective: Pt c/o shortness of breath   Objective: Vitals:   04/21/23 2005 04/21/23 2328 04/22/23 0341 04/22/23 0724  BP: 122/64 138/79 132/78 (!) 140/80  Pulse: 83 75 99 83  Resp: 16 18 20 18   Temp: 97.8 F (36.6 C) (!) 97.5 F (36.4 C) 97.8 F (36.6 C) (!) 97.5 F (36.4 C)  TempSrc:      SpO2: 94% 96% 95% 97%  Weight:      Height:        Intake/Output Summary (Last 24 hours) at 04/22/2023 0816 Last data filed at 04/21/2023 1837 Gross per 24 hour  Intake 360 ml  Output --  Net 360 ml   Filed Weights   04/18/23 1041 04/20/23 0413  Weight: 59.9 kg 62.5 kg    Examination:  General exam: appears uncomfortable  Respiratory system: course breath sounds b/l. Wheezes b/l  Cardiovascular  system: S1/S2+. No rubs or clicks  Gastrointestinal system: abd is soft, NT, ND & normal bowel sounds Central nervous system: alert & oriented. Moves all extremities  Psychiatry: Judgement and insight appears at baseline. Flat mood and affect    Data Reviewed: I have personally reviewed following labs and imaging studies  CBC: Recent Labs  Lab 04/18/23 1044 04/19/23 0624 04/20/23 0439 04/21/23 0521 04/22/23 0513  WBC 11.9* 15.1* 13.0* 13.7* 13.8*  HGB 14.4 13.6 13.0 13.7 13.7  HCT 41.9 40.3 38.4 40.6 40.4  MCV 91.3 92.9 93.2 92.3 91.8  PLT 313 300 293 345 336   Basic Metabolic Panel: Recent Labs  Lab 04/18/23 1044 04/19/23 0624 04/20/23 0439 04/21/23 0521 04/22/23 0513  NA 138 138 138 135 134*  K 3.6 3.4* 3.8 3.8 4.5  CL 106 105 106 103 102  CO2 24 22 24 22 23   GLUCOSE 131* 116* 91 130* 126*  BUN 22 18 23  31* 34*  CREATININE 1.12* 0.82 0.88 0.98 0.92  CALCIUM 9.4 9.8 9.7 10.2 10.3   GFR: Estimated Creatinine Clearance: 46.2 mL/min (by C-G formula based on SCr of 0.92 mg/dL). Liver Function Tests: Recent Labs  Lab 04/19/23 0624  AST 31  ALT 15  ALKPHOS 42  BILITOT 0.6  PROT 7.2  ALBUMIN 4.0   No results for input(s): "LIPASE", "AMYLASE" in the last 168 hours. No results for input(s): "AMMONIA" in the last 168 hours. Coagulation Profile: No results for input(s): "INR", "  PROTIME" in the last 168 hours. Cardiac Enzymes: No results for input(s): "CKTOTAL", "CKMB", "CKMBINDEX", "TROPONINI" in the last 168 hours. BNP (last 3 results) No results for input(s): "PROBNP" in the last 8760 hours. HbA1C: No results for input(s): "HGBA1C" in the last 72 hours. CBG: No results for input(s): "GLUCAP" in the last 168 hours. Lipid Profile: No results for input(s): "CHOL", "HDL", "LDLCALC", "TRIG", "CHOLHDL", "LDLDIRECT" in the last 72 hours. Thyroid Function Tests: No results for input(s): "TSH", "T4TOTAL", "FREET4", "T3FREE", "THYROIDAB" in the last 72 hours. Anemia  Panel: No results for input(s): "VITAMINB12", "FOLATE", "FERRITIN", "TIBC", "IRON", "RETICCTPCT" in the last 72 hours. Sepsis Labs: No results for input(s): "PROCALCITON", "LATICACIDVEN" in the last 168 hours.  Recent Results (from the past 240 hour(s))  Resp panel by RT-PCR (RSV, Flu A&B, Covid) Anterior Nasal Swab     Status: None   Collection Time: 04/18/23  2:32 PM   Specimen: Anterior Nasal Swab  Result Value Ref Range Status   SARS Coronavirus 2 by RT PCR NEGATIVE NEGATIVE Final    Comment: (NOTE) SARS-CoV-2 target nucleic acids are NOT DETECTED.  The SARS-CoV-2 RNA is generally detectable in upper respiratory specimens during the acute phase of infection. The lowest concentration of SARS-CoV-2 viral copies this assay can detect is 138 copies/mL. A negative result does not preclude SARS-Cov-2 infection and should not be used as the sole basis for treatment or other patient management decisions. A negative result may occur with  improper specimen collection/handling, submission of specimen other than nasopharyngeal swab, presence of viral mutation(s) within the areas targeted by this assay, and inadequate number of viral copies(<138 copies/mL). A negative result must be combined with clinical observations, patient history, and epidemiological information. The expected result is Negative.  Fact Sheet for Patients:  BloggerCourse.com  Fact Sheet for Healthcare Providers:  SeriousBroker.it  This test is no t yet approved or cleared by the [place] FDA and  has been authorized for detection and/or diagnosis of SARS-CoV-2 by FDA under an Emergency Use Authorization (EUA). This EUA will remain  in effect (meaning this test can be used) for the duration of the COVID-19 declaration under Section 564(b)(1) of the Act, 21 U.S.C.section 360bbb-3(b)(1), unless the authorization is terminated  or revoked sooner.       Influenza  A by PCR NEGATIVE NEGATIVE Final   Influenza B by PCR NEGATIVE NEGATIVE Final    Comment: (NOTE) The Xpert Xpress SARS-CoV-2/FLU/RSV plus assay is intended as an aid in the diagnosis of influenza from Nasopharyngeal swab specimens and should not be used as a sole basis for treatment. Nasal washings and aspirates are unacceptable for Xpert Xpress SARS-CoV-2/FLU/RSV testing.  Fact Sheet for Patients: BloggerCourse.com  Fact Sheet for Healthcare Providers: SeriousBroker.it  This test is not yet approved or cleared by the [place] FDA and has been authorized for detection and/or diagnosis of SARS-CoV-2 by FDA under an Emergency Use Authorization (EUA). This EUA will remain in effect (meaning this test can be used) for the duration of the COVID-19 declaration under Section 564(b)(1) of the Act, 21 U.S.C. section 360bbb-3(b)(1), unless the authorization is terminated or revoked.     Resp Syncytial Virus by PCR NEGATIVE NEGATIVE Final    Comment: (NOTE) Fact Sheet for Patients: BloggerCourse.com  Fact Sheet for Healthcare Providers: SeriousBroker.it  This test is not yet approved or cleared by the [place] FDA and has been authorized for detection and/or diagnosis of SARS-CoV-2 by FDA under an Emergency Use Authorization (EUA). This  EUA will remain in effect (meaning this test can be used) for the duration of the COVID-19 declaration under Section 564(b)(1) of the Act, 21 U.S.C. section 360bbb-3(b)(1), unless the authorization is terminated or revoked.  Performed at [facility], [address], [place], [place] [address]          Radiology Studies: No results found.      Scheduled Meds:  docusate sodium  200 mg Oral BID   enoxaparin (LOVENOX) injection  40 mg Subcutaneous Q24H   fenofibrate  54 mg Oral Daily   FLUoxetine  40 mg Oral Daily    levofloxacin  750 mg Oral Daily   levothyroxine  100 mcg Oral Q0600   lisinopril  10 mg Oral Daily   methylPREDNISolone (SOLU-MEDROL) injection  40 mg Intravenous Q12H   montelukast  10 mg Oral Daily   pantoprazole  40 mg Oral Daily   polyethylene glycol  17 g Oral Daily   Continuous Infusions:     LOS: 4 days    Time spent: 25 mins     Charise Killian, MD [facility] Pager [phone]  If 7PM-7AM, please contact night-coverage [email or web address] 04/22/2023, 8:16 AM

## 2023-04-22 NOTE — Progress Notes (Signed)
PHARMACIST - PHYSICIAN COMMUNICATION   CONCERNING: Methylprednisolone IV    Current order: Methylprednisolone IV 60 mg q 12 hours     DESCRIPTION: Per [place] Protocol:   IV methylprednisolone will be converted to either a q12h or q24h frequency with the same total daily dose (TDD).  Ordered Dose: 1 to 125 mg TDD; convert to: TDD q24h.  Ordered Dose: 126 to 250 mg TDD; convert to: TDD div q12h.  Ordered Dose: >250 mg TDD; DAW.  Order has been adjusted to: Methylprednisolone IV 120mg  daily   Torie Towle Rodriguez-Guzman PharmD, BCPS 04/22/2023 11:39 AM

## 2023-04-23 DIAGNOSIS — J45901 Unspecified asthma with (acute) exacerbation: Secondary | ICD-10-CM | POA: Diagnosis not present

## 2023-04-23 DIAGNOSIS — F411 Generalized anxiety disorder: Secondary | ICD-10-CM | POA: Diagnosis not present

## 2023-04-23 DIAGNOSIS — I10 Essential (primary) hypertension: Secondary | ICD-10-CM | POA: Diagnosis not present

## 2023-04-23 LAB — BASIC METABOLIC PANEL
Anion gap: 10 (ref 5–15)
BUN: 42 mg/dL — ABNORMAL HIGH (ref 8–23)
CO2: 24 mmol/L (ref 22–32)
Calcium: 10.1 mg/dL (ref 8.9–10.3)
Chloride: 101 mmol/L (ref 98–111)
Creatinine, Ser: 1.08 mg/dL — ABNORMAL HIGH (ref 0.44–1.00)
GFR, Estimated: 54 mL/min — ABNORMAL LOW (ref >60)
Glucose, Bld: 117 mg/dL — ABNORMAL HIGH (ref 70–99)
Potassium: 4.1 mmol/L (ref 3.5–5.1)
Sodium: 135 mmol/L (ref 135–145)

## 2023-04-23 LAB — CBC
HCT: 38.9 % (ref 36.0–46.0)
Hemoglobin: 13.3 g/dL (ref 12.0–15.0)
MCH: 31.1 pg (ref 26.0–34.0)
MCHC: 34.2 g/dL (ref 30.0–36.0)
MCV: 91.1 fL (ref 80.0–100.0)
Platelets: 341 10*3/uL (ref 150–400)
RBC: 4.27 MIL/uL (ref 3.87–5.11)
RDW: 12.4 % (ref 11.5–15.5)
WBC: 19.4 10*3/uL — ABNORMAL HIGH (ref 4.0–10.5)
nRBC: 0 % (ref 0.0–0.2)

## 2023-04-23 NOTE — Plan of Care (Signed)

## 2023-04-23 NOTE — Progress Notes (Signed)
PROGRESS NOTE    Bonnie West  ZOX:[id number] DOB: 1951/01/09 DOA: 04/18/2023 PCP: Sallyanne Kuster, NP    Assessment & Plan:   Principal Problem:   Asthma exacerbation Active Problems:   Hyperlipidemia, unspecified   Hypothyroidism   GAD (generalized anxiety disorder)   Essential hypertension, benign   Diastolic dysfunction  Assessment and Plan: Asthma exacerbation: unknown stage and/or severity. Resp status has improved from day prior slightly. Continue on levaquin, bronchodilators & IV steroids. Encourage incentive spirometry   Chronic diastolic CHF: appears euvolemic. Monitor I/Os   HTN: continue on lisinopril    Generalized anxiety disorder: unknown severity. Continue on home dose of xanax, fluoxetine   Hypothyroidism: continue on home dose of levothyroxine    HLD: continue on home dose fenofibrate   Hypokalemia: WNL today     DVT prophylaxis: lovenox  Code Status: full  Family Communication:  Disposition Plan: likely d/c back home.  Level of care: Progressive  Status is: Inpatient Remains inpatient appropriate because: likely d/c tomorrow if resp status has improved     Consultants:  Pulmon   Procedures:   Antimicrobials: levaquin  Subjective: Pt c/o cough & shortness of breath   Objective: Vitals:   04/22/23 2309 04/22/23 2317 04/23/23 0407 04/23/23 0538  BP: 132/76  126/76 132/75  Pulse: 96  85 81  Resp: 18  18 20   Temp: 97.8 F (36.6 C)  98 F (36.7 C)   TempSrc:      SpO2: 95% 94% 96% 96%  Weight:      Height:        Intake/Output Summary (Last 24 hours) at 04/23/2023 0819 Last data filed at 04/23/2023 0400 Gross per 24 hour  Intake 480 ml  Output --  Net 480 ml   Filed Weights   04/18/23 1041 04/20/23 0413  Weight: 59.9 kg 62.5 kg    Examination:  General exam: Appears calm but uncomfortable  Respiratory system: course breath sounds b/l  Cardiovascular system: S1 & S2+. No rubs or gallops  Gastrointestinal system: abd is  soft, NT, ND & normal bowel sounds  Central nervous system: alert and oriented. Moves all extremities  Psychiatry: Judgement and insight appears at baseline. Flat mood and affect     Data Reviewed: I have personally reviewed following labs and imaging studies  CBC: Recent Labs  Lab 04/19/23 0624 04/20/23 0439 04/21/23 0521 04/22/23 0513 04/23/23 0512  WBC 15.1* 13.0* 13.7* 13.8* 19.4*  HGB 13.6 13.0 13.7 13.7 13.3  HCT 40.3 38.4 40.6 40.4 38.9  MCV 92.9 93.2 92.3 91.8 91.1  PLT 300 293 345 336 341   Basic Metabolic Panel: Recent Labs  Lab 04/19/23 0624 04/20/23 0439 04/21/23 0521 04/22/23 0513 04/23/23 0512  NA 138 138 135 134* 135  K 3.4* 3.8 3.8 4.5 4.1  CL 105 106 103 102 101  CO2 22 24 22 23 24   GLUCOSE 116* 91 130* 126* 117*  BUN 18 23 31* 34* 42*  CREATININE 0.82 0.88 0.98 0.92 1.08*  CALCIUM 9.8 9.7 10.2 10.3 10.1   GFR: Estimated Creatinine Clearance: 39.3 mL/min (A) (by C-G formula based on SCr of 1.08 mg/dL (H)). Liver Function Tests: Recent Labs  Lab 04/19/23 0624  AST 31  ALT 15  ALKPHOS 42  BILITOT 0.6  PROT 7.2  ALBUMIN 4.0   No results for input(s): "LIPASE", "AMYLASE" in the last 168 hours. No results for input(s): "AMMONIA" in the last 168 hours. Coagulation Profile: No results for input(s): "INR", "PROTIME" in  the last 168 hours. Cardiac Enzymes: No results for input(s): "CKTOTAL", "CKMB", "CKMBINDEX", "TROPONINI" in the last 168 hours. BNP (last 3 results) No results for input(s): "PROBNP" in the last 8760 hours. HbA1C: No results for input(s): "HGBA1C" in the last 72 hours. CBG: No results for input(s): "GLUCAP" in the last 168 hours. Lipid Profile: No results for input(s): "CHOL", "HDL", "LDLCALC", "TRIG", "CHOLHDL", "LDLDIRECT" in the last 72 hours. Thyroid Function Tests: No results for input(s): "TSH", "T4TOTAL", "FREET4", "T3FREE", "THYROIDAB" in the last 72 hours. Anemia Panel: No results for input(s): "VITAMINB12",  "FOLATE", "FERRITIN", "TIBC", "IRON", "RETICCTPCT" in the last 72 hours. Sepsis Labs: No results for input(s): "PROCALCITON", "LATICACIDVEN" in the last 168 hours.  Recent Results (from the past 240 hour(s))  Resp panel by RT-PCR (RSV, Flu A&B, Covid) Anterior Nasal Swab     Status: None   Collection Time: 04/18/23  2:32 PM   Specimen: Anterior Nasal Swab  Result Value Ref Range Status   SARS Coronavirus 2 by RT PCR NEGATIVE NEGATIVE Final    Comment: (NOTE) SARS-CoV-2 target nucleic acids are NOT DETECTED.  The SARS-CoV-2 RNA is generally detectable in upper respiratory specimens during the acute phase of infection. The lowest concentration of SARS-CoV-2 viral copies this assay can detect is 138 copies/mL. A negative result does not preclude SARS-Cov-2 infection and should not be used as the sole basis for treatment or other patient management decisions. A negative result may occur with  improper specimen collection/handling, submission of specimen other than nasopharyngeal swab, presence of viral mutation(s) within the areas targeted by this assay, and inadequate number of viral copies(<138 copies/mL). A negative result must be combined with clinical observations, patient history, and epidemiological information. The expected result is Negative.  Fact Sheet for Patients:  BloggerCourse.com  Fact Sheet for Healthcare Providers:  SeriousBroker.it  This test is no t yet approved or cleared by the [place] FDA and  has been authorized for detection and/or diagnosis of SARS-CoV-2 by FDA under an Emergency Use Authorization (EUA). This EUA will remain  in effect (meaning this test can be used) for the duration of the COVID-19 declaration under Section 564(b)(1) of the Act, 21 U.S.C.section 360bbb-3(b)(1), unless the authorization is terminated  or revoked sooner.       Influenza A by PCR NEGATIVE NEGATIVE Final   Influenza B  by PCR NEGATIVE NEGATIVE Final    Comment: (NOTE) The Xpert Xpress SARS-CoV-2/FLU/RSV plus assay is intended as an aid in the diagnosis of influenza from Nasopharyngeal swab specimens and should not be used as a sole basis for treatment. Nasal washings and aspirates are unacceptable for Xpert Xpress SARS-CoV-2/FLU/RSV testing.  Fact Sheet for Patients: BloggerCourse.com  Fact Sheet for Healthcare Providers: SeriousBroker.it  This test is not yet approved or cleared by the [place] FDA and has been authorized for detection and/or diagnosis of SARS-CoV-2 by FDA under an Emergency Use Authorization (EUA). This EUA will remain in effect (meaning this test can be used) for the duration of the COVID-19 declaration under Section 564(b)(1) of the Act, 21 U.S.C. section 360bbb-3(b)(1), unless the authorization is terminated or revoked.     Resp Syncytial Virus by PCR NEGATIVE NEGATIVE Final    Comment: (NOTE) Fact Sheet for Patients: BloggerCourse.com  Fact Sheet for Healthcare Providers: SeriousBroker.it  This test is not yet approved or cleared by the [place] FDA and has been authorized for detection and/or diagnosis of SARS-CoV-2 by FDA under an Emergency Use Authorization (EUA). This EUA will  remain in effect (meaning this test can be used) for the duration of the COVID-19 declaration under Section 564(b)(1) of the Act, 21 U.S.C. section 360bbb-3(b)(1), unless the authorization is terminated or revoked.  Performed at [facility], [address] [place], [place] [address]          Radiology Studies: No results found.      Scheduled Meds:  docusate sodium  200 mg Oral BID   enoxaparin (LOVENOX) injection  40 mg Subcutaneous Q24H   fenofibrate  54 mg Oral Daily   FLUoxetine  40 mg Oral Daily   levofloxacin  750 mg Oral Daily   levothyroxine  100  mcg Oral Q0600   lisinopril  10 mg Oral Daily   methylPREDNISolone (SOLU-MEDROL) injection  120 mg Intravenous Q24H   montelukast  10 mg Oral Daily   pantoprazole  40 mg Oral Daily   polyethylene glycol  17 g Oral Daily   Continuous Infusions:     LOS: 5 days    Time spent: 25 mins     Charise Killian, MD [facility] Pager [phone]  If 7PM-7AM, please contact night-coverage [email or web address] 04/23/2023, 8:19 AM

## 2023-04-24 DIAGNOSIS — F411 Generalized anxiety disorder: Secondary | ICD-10-CM | POA: Diagnosis not present

## 2023-04-24 DIAGNOSIS — I10 Essential (primary) hypertension: Secondary | ICD-10-CM | POA: Diagnosis not present

## 2023-04-24 DIAGNOSIS — J45901 Unspecified asthma with (acute) exacerbation: Secondary | ICD-10-CM | POA: Diagnosis not present

## 2023-04-24 LAB — BASIC METABOLIC PANEL
Anion gap: 8 (ref 5–15)
BUN: 43 mg/dL — ABNORMAL HIGH (ref 8–23)
CO2: 25 mmol/L (ref 22–32)
Calcium: 9.9 mg/dL (ref 8.9–10.3)
Chloride: 101 mmol/L (ref 98–111)
Creatinine, Ser: 0.93 mg/dL (ref 0.44–1.00)
GFR, Estimated: >60 mL/min (ref >60)
Glucose, Bld: 125 mg/dL — ABNORMAL HIGH (ref 70–99)
Potassium: 4.2 mmol/L (ref 3.5–5.1)
Sodium: 134 mmol/L — ABNORMAL LOW (ref 135–145)

## 2023-04-24 LAB — CBC
HCT: 40 % (ref 36.0–46.0)
Hemoglobin: 13.6 g/dL (ref 12.0–15.0)
MCH: 31.4 pg (ref 26.0–34.0)
MCHC: 34 g/dL (ref 30.0–36.0)
MCV: 92.4 fL (ref 80.0–100.0)
Platelets: 321 10*3/uL (ref 150–400)
RBC: 4.33 MIL/uL (ref 3.87–5.11)
RDW: 12.1 % (ref 11.5–15.5)
WBC: 15.8 10*3/uL — ABNORMAL HIGH (ref 4.0–10.5)
nRBC: 0 % (ref 0.0–0.2)

## 2023-04-24 MED ORDER — IPRATROPIUM-ALBUTEROL 0.5-2.5 (3) MG/3ML IN SOLN
3.0000 mL | RESPIRATORY_TRACT | 0 refills | Status: DC | PRN
Start: 2023-04-24 — End: 2023-04-27

## 2023-04-24 MED ORDER — METHYLPREDNISOLONE SODIUM SUCC 125 MG IJ SOLR
120.0000 mg | INTRAMUSCULAR | Status: DC
  Administered 2023-04-24: 120 mg via INTRAVENOUS
  Filled 2023-04-24: qty 2

## 2023-04-24 MED ORDER — PREDNISONE 20 MG PO TABS: ORAL_TABLET | ORAL | 0 refills | Status: DC

## 2023-04-24 MED ORDER — GUAIFENESIN 100 MG/5ML PO LIQD: 5.0000 mL | ORAL | 0 refills | Status: AC | PRN

## 2023-04-24 MED ORDER — HYDROCODONE-ACETAMINOPHEN 5-325 MG PO TABS
1.0000 | ORAL_TABLET | Freq: Three times a day (TID) | ORAL | 0 refills | Status: DC | PRN
Start: 2023-04-24 — End: 2023-04-27

## 2023-04-24 MED ORDER — SALINE SPRAY 0.65 % NA SOLN
1.0000 | NASAL | Status: DC | PRN
  Filled 2023-04-24: qty 44

## 2023-04-24 NOTE — Discharge Summary (Signed)
Physician Discharge Summary  Bonnie West ZOX:[id number] DOB: 1951/06/12 DOA: 04/18/2023  PCP: Sallyanne Kuster, NP  Admit date: 04/18/2023 Discharge date: 04/24/2023  Admitted From: home  Disposition:  home   Recommendations for Outpatient Follow-up:  Follow up with PCP in 1-2 weeks F/u w/ pulmon in 1-2 weeks   Home Health:  Equipment/Devices:  Discharge Condition: stable  CODE STATUS: full  Diet recommendation: Heart Healthy  Brief/Interim Summary: HPI was taken from Dr. Alvester Morin: Bonnie West is a 72 y.o. female with medical history significant of asthma, GERD, hypertension, hyperlipidemia, diastolic dysfunction, hypothyroidism presenting with asthma exacerbation.  Patient ports 1 to 2 weeks of increased work of breathing, cough, wheezing.  No fevers or chills.  Positive mild rhinorrhea.  Was seen by her pulmonologist roughly 1 week ago.  Was given a course of Levaquin and prednisone for treatment.  Patient reports persistent symptoms despite treatment no chest pain.  No abdominal pain.  No nausea or vomiting.  Has been using home inhalers with minimal improvement in symptoms.  No orthopnea or PND.  Patient called her pulmonologist and was redirected to the ER for further evaluation per report. Presented to the ER afebrile, heart rate 100s, BP stable.  Satting well on room air.  White count 11.9, hemoglobin 14, platelets 313, COVID flu and RSV negative.  VBG with a respiratory alkalosis.  Creatinine 1.12.  Chest x-ray within normal limits.  As per Dr. Mayford Knife 5/29-04/24/23: Pt was found to have an asthma exacerbation that was treated w/ levaquin, steroids & bronchodilators. Pt also did require supplemental oxygen that was able to be weaned off prior to d/c. Pt was d/c home w/ a steroid taper to complete the course.    Discharge Diagnoses:  Principal Problem:   Asthma exacerbation Active Problems:   Hyperlipidemia, unspecified   Hypothyroidism   GAD (generalized anxiety disorder)    Essential hypertension, benign   Diastolic dysfunction  Asthma exacerbation: unknown stage and/or severity.Resp status much improved today. Completed abx course. Continue bronchodilators & steroids. Encourage incentive spirometry    Chronic diastolic CHF: appears euvolemic. Monitor I/Os   HTN: continue on lisinopril    Generalized anxiety disorder: unknown severity. Continue on home dose of xanax, fluoxetine    Hypothyroidism: continue on home dose of levothyroxine    HLD: continue on home dose fenofibrate    Hypokalemia: WNL today  Discharge Instructions  Discharge Instructions     Diet - low sodium heart healthy   Complete by: As directed    Discharge instructions   Complete by: As directed    F/u w/ PCP in 1-2 weeks. F/u w/ pulmon in 1-2 weeks.   Increase activity slowly   Complete by: As directed       Allergies as of 04/24/2023       Reactions   Advair Diskus [fluticasone-salmeterol] Other (See Comments)   Causes asthma attack   Codeine    Erythromycin Other (See Comments)   Stomach pain   Morphine And Codeine Itching, Nausea And Vomiting   Nsaids    Sulfa Antibiotics Other (See Comments)   unknown        Medication List     STOP taking these medications    baclofen 10 MG tablet Commonly known as: LIORESAL   levofloxacin 750 MG tablet Commonly known as: Levaquin   predniSONE 10 MG (21) Tbpk tablet Commonly known as: STERAPRED UNI-PAK 21 TAB Replaced by: predniSONE 20 MG tablet   promethazine-dextromethorphan 6.25-15 MG/5ML syrup Commonly known  as: PROMETHAZINE-DM       TAKE these medications    acetaminophen 500 MG tablet Commonly known as: TYLENOL Take 1,000 mg by mouth 2 (two) times daily as needed for mild pain or moderate pain.   albuterol 108 (90 Base) MCG/ACT inhaler Commonly known as: VENTOLIN HFA Inhale 1-2 puffs into the lungs every 4 (four) hours as needed for wheezing or shortness of breath.   ALPRAZolam 0.25 MG  tablet Commonly known as: XANAX Take 1 tablet (0.25 mg total) by mouth 2 (two) times daily as needed for anxiety.   Atrovent HFA 17 MCG/ACT inhaler Generic drug: ipratropium INHALE 2 PUFFS 4 TIMES DAILY   bisacodyl 5 MG EC tablet Commonly known as: DULCOLAX Take 5-10 mg by mouth at bedtime as needed for moderate constipation. Two at night   cetirizine 10 MG tablet Commonly known as: ZYRTEC Take 10 mg by mouth daily.   cholecalciferol 1000 units tablet Commonly known as: VITAMIN D Take 1,000 Units by mouth at bedtime.   clindamycin 1 % external solution Commonly known as: CLEOCIN T Apply 1 Application topically 2 (two) times daily as needed. For skin breakdown   cyanocobalamin 1000 MCG tablet Commonly known as: VITAMIN B12 Take 1,000 mcg by mouth daily.   cyclobenzaprine 5 MG tablet Commonly known as: FLEXERIL Take 1-2 tablets (5-10 mg total) by mouth at bedtime.   docusate sodium 100 MG capsule Commonly known as: COLACE Take 100-200 mg by mouth at bedtime as needed for mild constipation or moderate constipation.   estradiol 0.1 MG/GM vaginal cream Commonly known as: ESTRACE INSERT 1 GRAM VAGINALLY TWICE WEEKLY AT BEDTIME   ezetimibe 10 MG tablet Commonly known as: ZETIA Take 1 tablet by mouth daily.   fenofibrate 145 MG tablet Commonly known as: TRICOR Take 1 tablet (145 mg total) by mouth daily.   FLUoxetine 40 MG capsule Commonly known as: PROZAC Take 1 capsule (40 mg total) by mouth daily.   fluticasone 110 MCG/ACT inhaler Commonly known as: Flovent HFA USE 2 PUFFS INHALED TWICE DAILY   guaiFENesin 100 MG/5ML liquid Commonly known as: ROBITUSSIN Take 5 mLs by mouth every 4 (four) hours as needed for up to 7 days for cough or to loosen phlegm.   HYDROcodone-acetaminophen 5-325 MG tablet Commonly known as: NORCO/VICODIN Take 1 tablet by mouth every 8 (eight) hours as needed for up to 3 days for severe pain or moderate pain. What changed:  when to take  this reasons to take this   ipratropium-albuterol 0.5-2.5 (3) MG/3ML Soln Commonly known as: DUONEB Take 3 mLs by nebulization every 4 (four) hours as needed (SOB, wheezing).   isometheptene-acetaminophen-dichloralphenazone 65-100-325 MG capsule Commonly known as: MIDRIN Take 1 capsule by mouth 4 (four) times daily as needed for migraine. Maximum 5 capsules in 12 hours for migraine headaches, 8 capsules in 24 hours for tension headaches.   levothyroxine 100 MCG tablet Commonly known as: SYNTHROID TAKE 1 TABLET BY MOUTH EVERY DAY IN THE MORNING   lisinopril 10 MG tablet Commonly known as: ZESTRIL Take 1 tablet (10 mg total) by mouth daily.   magnesium oxide 400 MG tablet Commonly known as: MAG-OX Take 400 mg by mouth at bedtime.   montelukast 10 MG tablet Commonly known as: SINGULAIR Take 1 tablet (10 mg total) by mouth daily.   omeprazole 40 MG capsule Commonly known as: PRILOSEC TAKE 1 CAPSULE BY MOUTH EVERY DAY   polyethylene glycol powder 17 GM/SCOOP powder Commonly known as: GLYCOLAX/MIRALAX Take 17 g by  mouth every evening.   predniSONE 20 MG tablet Commonly known as: DELTASONE 60 mg BID x 3 days, 40 mg BID x 3 days, 20mg  BID x 3 days, then stop Replaces: predniSONE 10 MG (21) Tbpk tablet   triamcinolone cream 0.1 % Commonly known as: KENALOG Apply 1 application topically 2 (two) times daily as needed. For skin breakdown   zolpidem 5 MG tablet Commonly known as: AMBIEN Take 0.5-1 tablets (2.5-5 mg total) by mouth at bedtime as needed for sleep.        Allergies  Allergen Reactions   Advair Diskus [Fluticasone-Salmeterol] Other (See Comments)    Causes asthma attack    Codeine    Erythromycin Other (See Comments)    Stomach pain   Morphine And Codeine Itching and Nausea And Vomiting   Nsaids    Sulfa Antibiotics Other (See Comments)    unknown    Consultations: Pulmon    Procedures/Studies: DG Chest Port 1 View  Result Date:  04/18/2023 CLINICAL DATA:  One-week history of shortness of breath EXAM: PORTABLE CHEST 1 VIEW COMPARISON:  Chest radiograph dated 04/02/2022 FINDINGS: Normal lung volumes. No focal consolidations. No pleural effusion or pneumothorax. The heart size and mediastinal contours are within normal limits. No acute osseous abnormality. IMPRESSION: No active disease. Electronically Signed   By: Agustin Cree M.D.   On: 04/18/2023 11:34   (Echo, Carotid, EGD, Colonoscopy, ERCP)    Subjective: Pt c/o fatigue    Discharge Exam: Vitals:   04/24/23 0830 04/24/23 1137  BP: 138/78   Pulse: 79   Resp: 16   Temp: 98 F (36.7 C)   SpO2: 96% 97%   Vitals:   04/23/23 2323 04/24/23 0325 04/24/23 0830 04/24/23 1137  BP: 106/61 109/61 138/78   Pulse: 86 84 79   Resp: 19 19 16    Temp: 98.1 F (36.7 C) 97.9 F (36.6 C) 98 F (36.7 C)   TempSrc:      SpO2: 93% 93% 96% 97%  Weight:      Height:        General: Pt is alert, awake, not in acute distress Cardiovascular: S1/S2 +, no rubs, no gallops Respiratory: decreased breath sounds b/l  Abdominal: Soft, NT, ND, bowel sounds + Extremities: no edema, no cyanosis    The results of significant diagnostics from this hospitalization (including imaging, microbiology, ancillary and laboratory) are listed below for reference.     Microbiology: Recent Results (from the past 240 hour(s))  Resp panel by RT-PCR (RSV, Flu A&B, Covid) Anterior Nasal Swab     Status: None   Collection Time: 04/18/23  2:32 PM   Specimen: Anterior Nasal Swab  Result Value Ref Range Status   SARS Coronavirus 2 by RT PCR NEGATIVE NEGATIVE Final    Comment: (NOTE) SARS-CoV-2 target nucleic acids are NOT DETECTED.  The SARS-CoV-2 RNA is generally detectable in upper respiratory specimens during the acute phase of infection. The lowest concentration of SARS-CoV-2 viral copies this assay can detect is 138 copies/mL. A negative result does not preclude SARS-Cov-2 infection and  should not be used as the sole basis for treatment or other patient management decisions. A negative result may occur with  improper specimen collection/handling, submission of specimen other than nasopharyngeal swab, presence of viral mutation(s) within the areas targeted by this assay, and inadequate number of viral copies(<138 copies/mL). A negative result must be combined with clinical observations, patient history, and epidemiological information. The expected result is Negative.  Fact Sheet for  Patients:  BloggerCourse.com  Fact Sheet for Healthcare Providers:  SeriousBroker.it  This test is no t yet approved or cleared by the [place] FDA and  has been authorized for detection and/or diagnosis of SARS-CoV-2 by FDA under an Emergency Use Authorization (EUA). This EUA will remain  in effect (meaning this test can be used) for the duration of the COVID-19 declaration under Section 564(b)(1) of the Act, 21 U.S.C.section 360bbb-3(b)(1), unless the authorization is terminated  or revoked sooner.       Influenza A by PCR NEGATIVE NEGATIVE Final   Influenza B by PCR NEGATIVE NEGATIVE Final    Comment: (NOTE) The Xpert Xpress SARS-CoV-2/FLU/RSV plus assay is intended as an aid in the diagnosis of influenza from Nasopharyngeal swab specimens and should not be used as a sole basis for treatment. Nasal washings and aspirates are unacceptable for Xpert Xpress SARS-CoV-2/FLU/RSV testing.  Fact Sheet for Patients: BloggerCourse.com  Fact Sheet for Healthcare Providers: SeriousBroker.it  This test is not yet approved or cleared by the [place] FDA and has been authorized for detection and/or diagnosis of SARS-CoV-2 by FDA under an Emergency Use Authorization (EUA). This EUA will remain in effect (meaning this test can be used) for the duration of the COVID-19 declaration  under Section 564(b)(1) of the Act, 21 U.S.C. section 360bbb-3(b)(1), unless the authorization is terminated or revoked.     Resp Syncytial Virus by PCR NEGATIVE NEGATIVE Final    Comment: (NOTE) Fact Sheet for Patients: BloggerCourse.com  Fact Sheet for Healthcare Providers: SeriousBroker.it  This test is not yet approved or cleared by the [place] FDA and has been authorized for detection and/or diagnosis of SARS-CoV-2 by FDA under an Emergency Use Authorization (EUA). This EUA will remain in effect (meaning this test can be used) for the duration of the COVID-19 declaration under Section 564(b)(1) of the Act, 21 U.S.C. section 360bbb-3(b)(1), unless the authorization is terminated or revoked.  Performed at [facility], [address] [place], [place] [address]      Labs: BNP (last 3 results) No results for input(s): "BNP" in the last 8760 hours. Basic Metabolic Panel: Recent Labs  Lab 04/20/23 0439 04/21/23 0521 04/22/23 0513 04/23/23 0512 04/24/23 0500  NA 138 135 134* 135 134*  K 3.8 3.8 4.5 4.1 4.2  CL 106 103 102 101 101  CO2 24 22 23 24 25   GLUCOSE 91 130* 126* 117* 125*  BUN 23 31* 34* 42* 43*  CREATININE 0.88 0.98 0.92 1.08* 0.93  CALCIUM 9.7 10.2 10.3 10.1 9.9   Liver Function Tests: Recent Labs  Lab 04/19/23 0624  AST 31  ALT 15  ALKPHOS 42  BILITOT 0.6  PROT 7.2  ALBUMIN 4.0   No results for input(s): "LIPASE", "AMYLASE" in the last 168 hours. No results for input(s): "AMMONIA" in the last 168 hours. CBC: Recent Labs  Lab 04/20/23 0439 04/21/23 0521 04/22/23 0513 04/23/23 0512 04/24/23 0500  WBC 13.0* 13.7* 13.8* 19.4* 15.8*  HGB 13.0 13.7 13.7 13.3 13.6  HCT 38.4 40.6 40.4 38.9 40.0  MCV 93.2 92.3 91.8 91.1 92.4  PLT 293 345 336 341 321   Cardiac Enzymes: No results for input(s): "CKTOTAL", "CKMB", "CKMBINDEX", "TROPONINI" in the last 168 hours. BNP: Invalid  input(s): "POCBNP" CBG: No results for input(s): "GLUCAP" in the last 168 hours. D-Dimer No results for input(s): "DDIMER" in the last 72 hours. Hgb A1c No results for input(s): "HGBA1C" in the last 72 hours. Lipid Profile No results for  input(s): "CHOL", "HDL", "LDLCALC", "TRIG", "CHOLHDL", "LDLDIRECT" in the last 72 hours. Thyroid function studies No results for input(s): "TSH", "T4TOTAL", "T3FREE", "THYROIDAB" in the last 72 hours.  Invalid input(s): "FREET3" Anemia work up No results for input(s): "VITAMINB12", "FOLATE", "FERRITIN", "TIBC", "IRON", "RETICCTPCT" in the last 72 hours. Urinalysis    Component Value Date/Time   COLORURINE YELLOW 09/22/2017 1439   APPEARANCEUR Clear 11/08/2022 1611   LABSPEC 1.015 09/22/2017 1439   PHURINE 7.0 09/22/2017 1439   GLUCOSEU Negative 11/08/2022 1611   HGBUR MODERATE (A) 09/22/2017 1439   BILIRUBINUR Negative 11/08/2022 1611   KETONESUR NEGATIVE 09/22/2017 1439   PROTEINUR Negative 11/08/2022 1611   PROTEINUR NEGATIVE 09/22/2017 1439   UROBILINOGEN 0.2 02/04/2020 1441   NITRITE Negative 11/08/2022 1611   NITRITE NEGATIVE 09/22/2017 1439   LEUKOCYTESUR Trace (A) 11/08/2022 1611   Sepsis Labs Recent Labs  Lab 04/21/23 0521 04/22/23 0513 04/23/23 0512 04/24/23 0500  WBC 13.7* 13.8* 19.4* 15.8*   Microbiology Recent Results (from the past 240 hour(s))  Resp panel by RT-PCR (RSV, Flu A&B, Covid) Anterior Nasal Swab     Status: None   Collection Time: 04/18/23  2:32 PM   Specimen: Anterior Nasal Swab  Result Value Ref Range Status   SARS Coronavirus 2 by RT PCR NEGATIVE NEGATIVE Final    Comment: (NOTE) SARS-CoV-2 target nucleic acids are NOT DETECTED.  The SARS-CoV-2 RNA is generally detectable in upper respiratory specimens during the acute phase of infection. The lowest concentration of SARS-CoV-2 viral copies this assay can detect is 138 copies/mL. A negative result does not preclude SARS-Cov-2 infection and should not  be used as the sole basis for treatment or other patient management decisions. A negative result may occur with  improper specimen collection/handling, submission of specimen other than nasopharyngeal swab, presence of viral mutation(s) within the areas targeted by this assay, and inadequate number of viral copies(<138 copies/mL). A negative result must be combined with clinical observations, patient history, and epidemiological information. The expected result is Negative.  Fact Sheet for Patients:  BloggerCourse.com  Fact Sheet for Healthcare Providers:  SeriousBroker.it  This test is no t yet approved or cleared by the [place] FDA and  has been authorized for detection and/or diagnosis of SARS-CoV-2 by FDA under an Emergency Use Authorization (EUA). This EUA will remain  in effect (meaning this test can be used) for the duration of the COVID-19 declaration under Section 564(b)(1) of the Act, 21 U.S.C.section 360bbb-3(b)(1), unless the authorization is terminated  or revoked sooner.       Influenza A by PCR NEGATIVE NEGATIVE Final   Influenza B by PCR NEGATIVE NEGATIVE Final    Comment: (NOTE) The Xpert Xpress SARS-CoV-2/FLU/RSV plus assay is intended as an aid in the diagnosis of influenza from Nasopharyngeal swab specimens and should not be used as a sole basis for treatment. Nasal washings and aspirates are unacceptable for Xpert Xpress SARS-CoV-2/FLU/RSV testing.  Fact Sheet for Patients: BloggerCourse.com  Fact Sheet for Healthcare Providers: SeriousBroker.it  This test is not yet approved or cleared by the [place] FDA and has been authorized for detection and/or diagnosis of SARS-CoV-2 by FDA under an Emergency Use Authorization (EUA). This EUA will remain in effect (meaning this test can be used) for the duration of the COVID-19 declaration under Section  564(b)(1) of the Act, 21 U.S.C. section 360bbb-3(b)(1), unless the authorization is terminated or revoked.     Resp Syncytial Virus by PCR NEGATIVE NEGATIVE Final    Comment: (  NOTE) Fact Sheet for Patients: BloggerCourse.com  Fact Sheet for Healthcare Providers: SeriousBroker.it  This test is not yet approved or cleared by the [place] FDA and has been authorized for detection and/or diagnosis of SARS-CoV-2 by FDA under an Emergency Use Authorization (EUA). This EUA will remain in effect (meaning this test can be used) for the duration of the COVID-19 declaration under Section 564(b)(1) of the Act, 21 U.S.C. section 360bbb-3(b)(1), unless the authorization is terminated or revoked.  Performed at [facility], [address], [place], [place] [address]      Time coordinating discharge: Over 30 minutes  SIGNED:   Charise Killian, MD  [facility] 04/24/2023, 11:51 AM Pager   If 7PM-7AM, please contact night-coverage [email or web address]

## 2023-04-24 NOTE — Care Management Important Message (Signed)
Important Message  Patient Details  Name: Bonnie West MRN: [id number] Date of Birth: 12/29/1950   Medicare Important Message Given:  Yes     [name] 04/24/2023, 11:34 AM

## 2023-04-27 ENCOUNTER — Encounter: Payer: Self-pay | Admitting: Nurse Practitioner

## 2023-04-27 ENCOUNTER — Ambulatory Visit (INDEPENDENT_AMBULATORY_CARE_PROVIDER_SITE_OTHER): Payer: Medicare Other | Admitting: Nurse Practitioner

## 2023-04-27 VITALS — BP 138/88 | HR 78 | Temp 98.0°F | Resp 16 | Ht 61.0 in | Wt 138.2 lb

## 2023-04-27 DIAGNOSIS — Z09 Encounter for follow-up examination after completed treatment for conditions other than malignant neoplasm: Secondary | ICD-10-CM

## 2023-04-27 DIAGNOSIS — M545 Low back pain, unspecified: Secondary | ICD-10-CM | POA: Diagnosis not present

## 2023-04-27 DIAGNOSIS — J45901 Unspecified asthma with (acute) exacerbation: Secondary | ICD-10-CM

## 2023-04-27 DIAGNOSIS — F411 Generalized anxiety disorder: Secondary | ICD-10-CM | POA: Diagnosis not present

## 2023-04-27 DIAGNOSIS — J4540 Moderate persistent asthma, uncomplicated: Secondary | ICD-10-CM

## 2023-04-27 DIAGNOSIS — G8929 Other chronic pain: Secondary | ICD-10-CM

## 2023-04-27 MED ORDER — IPRATROPIUM-ALBUTEROL 0.5-2.5 (3) MG/3ML IN SOLN
3.0000 mL | RESPIRATORY_TRACT | 0 refills | Status: DC | PRN
Start: 2023-04-27 — End: 2023-09-07

## 2023-04-27 MED ORDER — HYDROCODONE-ACETAMINOPHEN 5-325 MG PO TABS
1.0000 | ORAL_TABLET | Freq: Four times a day (QID) | ORAL | 0 refills | Status: DC | PRN
Start: 2023-04-27 — End: 2023-07-27

## 2023-04-27 MED ORDER — ALPRAZOLAM 0.25 MG PO TABS
0.2500 mg | ORAL_TABLET | Freq: Two times a day (BID) | ORAL | 2 refills | Status: DC | PRN
Start: 2023-04-27 — End: 2023-07-27

## 2023-04-27 NOTE — Progress Notes (Signed)
[facility] [place], [place] [address] [place] [address] [phone]                                   Transitional [facility] Clinic   [facility] Discharge Acute Issues Care Follow Up                                                                        Patient Demographics  Bonnie West, is a 72 y.o. female  DOB [birth year]  MRN [id number].  Primary MD  Sallyanne Kuster, NP  Admit date: 04/18/2023 Discharge date: 04/24/2023  Reason for TCC follow Up - asthma exacerbation   Past Medical History:  Diagnosis Date   Allergic rhinitis    Anxiety    Asthma    Blood transfusion without reported diagnosis    Depression    Diverticulosis    Dyspnea    Esophagitis    GERD (gastroesophageal reflux disease)    Headache    Heart murmur    Hyperlipemia    Hypertension    Hypothyroidism    Obesity    Osteopenia    Palpitations    Pneumothorax    Sleep apnea     Past Surgical History:  Procedure Laterality Date   BUNIONECTOMY     CATARACT EXTRACTION W/PHACO Right 10/11/2016   Procedure: CATARACT EXTRACTION PHACO AND INTRAOCULAR LENS PLACEMENT (IOC);  Surgeon: Galen Manila, MD;  Location: [facility] ORS;  Service: Ophthalmology;  Laterality: Right;  Lot# [id number] H [place]: 00:37.7 AP%: 18.1 CDE: 6.80   CATARACT EXTRACTION W/PHACO Left 11/08/2016   Procedure: CATARACT EXTRACTION PHACO AND INTRAOCULAR LENS PLACEMENT (IOC);  Surgeon: Galen Manila, MD;  Location: [facility] ORS;  Service: Ophthalmology;  Laterality: Left;  PACK LOT: [id number] H US:00:32 AP:44 CDE:6.46   COLONOSCOPY     COLONOSCOPY WITH PROPOFOL N/A 11/26/2015   Procedure: COLONOSCOPY WITH PROPOFOL;  Surgeon: Scot Jun, MD;  Location: [facility] ENDOSCOPY;  Service: Endoscopy;  Laterality: N/A;   EYE SURGERY  07/23/2021   EYE SURGERY Left 09/24/2021   strabusmis, double vision   TONSILLECTOMY         Recent HPI and Hospital Course  Brief/Interim Summary: HPI was taken from Dr.  Alvester Morin: Bonnie West is a 72 y.o. female with medical history significant of asthma, GERD, hypertension, hyperlipidemia, diastolic dysfunction, hypothyroidism presenting with asthma exacerbation.  Patient ports 1 to 2 weeks of increased work of breathing, cough, wheezing.  No fevers or chills.  Positive mild rhinorrhea.  Was seen by her pulmonologist roughly 1 week ago.  Was given a course of Levaquin and prednisone for treatment.  Patient reports persistent symptoms despite treatment no chest pain.  No abdominal pain.  No nausea or vomiting.  Has been using home inhalers with minimal improvement in symptoms.  No orthopnea or PND.  Patient called her pulmonologist and was redirected to the ER for further evaluation per report. Presented to the ER afebrile, heart rate 100s, BP stable.  Satting well on room air.  White count 11.9, hemoglobin 14, platelets 313, COVID flu and RSV negative.  VBG with a respiratory alkalosis.  Creatinine 1.12.  Chest x-ray within  normal limits.   As per Dr. Mayford Knife 5/29-04/24/23: Pt was found to have an asthma exacerbation that was treated w/ levaquin, steroids & bronchodilators. Pt also did require supplemental oxygen that was able to be weaned off prior to d/c. Pt was d/c home w/ a steroid taper to complete the course.    Asthma exacerbation: unknown stage and/or severity.Resp status much improved today. Completed abx course. Continue bronchodilators & steroids. Encourage incentive spirometry    Chronic diastolic CHF: appears euvolemic. Monitor I/Os   HTN: continue on lisinopril    Generalized anxiety disorder: unknown severity. Continue on home dose of xanax, fluoxetine    Hypothyroidism: continue on home dose of levothyroxine    HLD: continue on home dose fenofibrate    Hypokalemia: WNL today    Post Hospital Acute Care Issue to be followed in the Clinic   Principal Problem:   Asthma exacerbation Active Problems:   Hyperlipidemia, unspecified    Hypothyroidism   GAD (generalized anxiety disorder)   Essential hypertension, benign   Diastolic dysfunction   Subjective:   Bonnie West today has, No headache, No chest pain, No abdominal pain - No Nausea, No new weakness tingling or numbness, No Cough - SOB. Cough still present, SOB some still.   Assessment & Plan   1. Hospital discharge follow-up Treated in hospital for asthma exacerbation. No significant changes in outpatient interventions. Currently stable.   2. Exacerbation of asthma, unspecified asthma severity, unspecified whether persistent Continue prn treatments as prescribed.  - ipratropium-albuterol (DUONEB) 0.5-2.5 (3) MG/3ML SOLN; Take 3 mLs by nebulization every 4 (four) hours as needed (SOB, wheezing).  Dispense: 540 mL; Refill: 0  3. Moderate persistent asthma without status asthmaticus without complication Continue current medications as prescribed.  - ipratropium-albuterol (DUONEB) 0.5-2.5 (3) MG/3ML SOLN; Take 3 mLs by nebulization every 4 (four) hours as needed (SOB, wheezing).  Dispense: 540 mL; Refill: 0  4. GAD (generalized anxiety disorder) Continue prn alprazolam as prescribed, refills ordered - ALPRAZolam (XANAX) 0.25 MG tablet; Take 1 tablet (0.25 mg total) by mouth 2 (two) times daily as needed for anxiety.  Dispense: 60 tablet; Refill: 2    Reason for frequent admissions/ER visits    asthma Anxiety Hypertension hypothyroidism   Objective:   Vitals:   04/27/23 1122  BP: 138/88  Pulse: 78  Resp: 16  Temp: 98 F (36.7 C)  SpO2: 96%  Weight: 138 lb 3.2 oz (62.7 kg)  Height: 5\' 1"  (1.549 m)    Wt Readings from Last 3 Encounters:  04/27/23 138 lb 3.2 oz (62.7 kg)  04/20/23 137 lb 12.6 oz (62.5 kg)  04/11/23 140 lb 9.6 oz (63.8 kg)    Allergies as of 04/27/2023       Reactions   Advair Diskus [fluticasone-salmeterol] Other (See Comments)   Causes asthma attack   Codeine    Erythromycin Other (See Comments)   Stomach pain    Morphine And Codeine Itching, Nausea And Vomiting   Nsaids    Sulfa Antibiotics Other (See Comments)   unknown        Medication List        Accurate as of April 27, 2023 11:55 AM. If you have any questions, ask your nurse or doctor.          acetaminophen 500 MG tablet Commonly known as: TYLENOL Take 1,000 mg by mouth 2 (two) times daily as needed for mild pain or moderate pain.   albuterol 108 (90 Base) MCG/ACT inhaler Commonly known  as: VENTOLIN HFA Inhale 1-2 puffs into the lungs every 4 (four) hours as needed for wheezing or shortness of breath.   ALPRAZolam 0.25 MG tablet Commonly known as: XANAX Take 1 tablet (0.25 mg total) by mouth 2 (two) times daily as needed for anxiety.   Atrovent HFA 17 MCG/ACT inhaler Generic drug: ipratropium INHALE 2 PUFFS 4 TIMES DAILY   bisacodyl 5 MG EC tablet Commonly known as: DULCOLAX Take 5-10 mg by mouth at bedtime as needed for moderate constipation. Two at night   cetirizine 10 MG tablet Commonly known as: ZYRTEC Take 10 mg by mouth daily.   cholecalciferol 1000 units tablet Commonly known as: VITAMIN D Take 1,000 Units by mouth at bedtime.   clindamycin 1 % external solution Commonly known as: CLEOCIN T Apply 1 Application topically 2 (two) times daily as needed. For skin breakdown   cyanocobalamin 1000 MCG tablet Commonly known as: VITAMIN B12 Take 1,000 mcg by mouth daily.   cyclobenzaprine 5 MG tablet Commonly known as: FLEXERIL Take 1-2 tablets (5-10 mg total) by mouth at bedtime.   docusate sodium 100 MG capsule Commonly known as: COLACE Take 100-200 mg by mouth at bedtime as needed for mild constipation or moderate constipation.   estradiol 0.1 MG/GM vaginal cream Commonly known as: ESTRACE INSERT 1 GRAM VAGINALLY TWICE WEEKLY AT BEDTIME   ezetimibe 10 MG tablet Commonly known as: ZETIA Take 1 tablet by mouth daily.   fenofibrate 145 MG tablet Commonly known as: TRICOR Take 1 tablet (145 mg total)  by mouth daily.   FLUoxetine 40 MG capsule Commonly known as: PROZAC Take 1 capsule (40 mg total) by mouth daily.   fluticasone 110 MCG/ACT inhaler Commonly known as: Flovent HFA USE 2 PUFFS INHALED TWICE DAILY   guaiFENesin 100 MG/5ML liquid Commonly known as: ROBITUSSIN Take 5 mLs by mouth every 4 (four) hours as needed for up to 7 days for cough or to loosen phlegm.   HYDROcodone-acetaminophen 5-325 MG tablet Commonly known as: NORCO/VICODIN Take 1 tablet by mouth every 8 (eight) hours as needed for up to 3 days for severe pain or moderate pain.   ipratropium-albuterol 0.5-2.5 (3) MG/3ML Soln Commonly known as: DUONEB Take 3 mLs by nebulization every 4 (four) hours as needed (SOB, wheezing).   isometheptene-acetaminophen-dichloralphenazone 65-100-325 MG capsule Commonly known as: MIDRIN Take 1 capsule by mouth 4 (four) times daily as needed for migraine. Maximum 5 capsules in 12 hours for migraine headaches, 8 capsules in 24 hours for tension headaches.   levothyroxine 100 MCG tablet Commonly known as: SYNTHROID TAKE 1 TABLET BY MOUTH EVERY DAY IN THE MORNING   lisinopril 10 MG tablet Commonly known as: ZESTRIL Take 1 tablet (10 mg total) by mouth daily.   magnesium oxide 400 MG tablet Commonly known as: MAG-OX Take 400 mg by mouth at bedtime.   montelukast 10 MG tablet Commonly known as: SINGULAIR Take 1 tablet (10 mg total) by mouth daily.   omeprazole 40 MG capsule Commonly known as: PRILOSEC TAKE 1 CAPSULE BY MOUTH EVERY DAY   polyethylene glycol powder 17 GM/SCOOP powder Commonly known as: GLYCOLAX/MIRALAX Take 17 g by mouth every evening.   predniSONE 20 MG tablet Commonly known as: DELTASONE 60 mg BID x 3 days, 40 mg BID x 3 days, 20mg  BID x 3 days, then stop   triamcinolone cream 0.1 % Commonly known as: KENALOG Apply 1 application topically 2 (two) times daily as needed. For skin breakdown   zolpidem 5 MG tablet  Commonly known as: AMBIEN Take  0.5-1 tablets (2.5-5 mg total) by mouth at bedtime as needed for sleep.         Physical Exam: Constitutional: Patient appears well-developed and well-nourished. Not in obvious distress. HENT: Normocephalic, atraumatic, External right and left ear normal. Oropharynx is clear and moist.  Eyes: Conjunctivae and EOM are normal. PERRLA, no scleral icterus. Neck: Normal ROM. Neck supple. No JVD. No tracheal deviation. No thyromegaly. CVS: RRR, S1/S2 +, no murmurs, no gallops, no carotid bruit.  Pulmonary: Effort and breath sounds normal, no stridor, rhonchi, wheezes, rales.  Abdominal: Soft. BS +, no distension, tenderness, rebound or guarding.  Musculoskeletal: Normal range of motion. No edema and no tenderness.  Lymphadenopathy: No lymphadenopathy noted, cervical, inguinal or axillary Neuro: Alert. Normal reflexes, muscle tone coordination. No cranial nerve deficit. Skin: Skin is warm and dry. No rash noted. Not diaphoretic. No erythema. No pallor. Psychiatric: Normal mood and affect. Behavior, judgment, thought content normal.   Data Review   Micro Results Recent Results (from the past 240 hour(s))  Resp panel by RT-PCR (RSV, Flu A&B, Covid) Anterior Nasal Swab     Status: None   Collection Time: 04/18/23  2:32 PM   Specimen: Anterior Nasal Swab  Result Value Ref Range Status   SARS Coronavirus 2 by RT PCR NEGATIVE NEGATIVE Final    Comment: (NOTE) SARS-CoV-2 target nucleic acids are NOT DETECTED.  The SARS-CoV-2 RNA is generally detectable in upper respiratory specimens during the acute phase of infection. The lowest concentration of SARS-CoV-2 viral copies this assay can detect is 138 copies/mL. A negative result does not preclude SARS-Cov-2 infection and should not be used as the sole basis for treatment or other patient management decisions. A negative result may occur with  improper specimen collection/handling, submission of specimen other than nasopharyngeal swab,  presence of viral mutation(s) within the areas targeted by this assay, and inadequate number of viral copies(<138 copies/mL). A negative result must be combined with clinical observations, patient history, and epidemiological information. The expected result is Negative.  Fact Sheet for Patients:  BloggerCourse.com  Fact Sheet for Healthcare Providers:  SeriousBroker.it  This test is no t yet approved or cleared by the [place] FDA and  has been authorized for detection and/or diagnosis of SARS-CoV-2 by FDA under an Emergency Use Authorization (EUA). This EUA will remain  in effect (meaning this test can be used) for the duration of the COVID-19 declaration under Section 564(b)(1) of the Act, 21 U.S.C.section 360bbb-3(b)(1), unless the authorization is terminated  or revoked sooner.       Influenza A by PCR NEGATIVE NEGATIVE Final   Influenza B by PCR NEGATIVE NEGATIVE Final    Comment: (NOTE) The Xpert Xpress SARS-CoV-2/FLU/RSV plus assay is intended as an aid in the diagnosis of influenza from Nasopharyngeal swab specimens and should not be used as a sole basis for treatment. Nasal washings and aspirates are unacceptable for Xpert Xpress SARS-CoV-2/FLU/RSV testing.  Fact Sheet for Patients: BloggerCourse.com  Fact Sheet for Healthcare Providers: SeriousBroker.it  This test is not yet approved or cleared by the [place] FDA and has been authorized for detection and/or diagnosis of SARS-CoV-2 by FDA under an Emergency Use Authorization (EUA). This EUA will remain in effect (meaning this test can be used) for the duration of the COVID-19 declaration under Section 564(b)(1) of the Act, 21 U.S.C. section 360bbb-3(b)(1), unless the authorization is terminated or revoked.     Resp Syncytial Virus by PCR NEGATIVE NEGATIVE  Final    Comment: (NOTE) Fact Sheet for  Patients: BloggerCourse.com  Fact Sheet for Healthcare Providers: SeriousBroker.it  This test is not yet approved or cleared by the [place] FDA and has been authorized for detection and/or diagnosis of SARS-CoV-2 by FDA under an Emergency Use Authorization (EUA). This EUA will remain in effect (meaning this test can be used) for the duration of the COVID-19 declaration under Section 564(b)(1) of the Act, 21 U.S.C. section 360bbb-3(b)(1), unless the authorization is terminated or revoked.  Performed at [facility], [address] [place], [place] [address]      CBC Recent Labs  Lab 04/21/23 [phone] 04/22/23 [id number] 04/23/23 0512 04/24/23 0500  WBC 13.7* 13.8* 19.4* 15.8*  HGB 13.7 13.7 13.3 13.6  HCT 40.6 40.4 38.9 40.0  PLT 345 336 341 321  MCV 92.3 91.8 91.1 92.4  MCH 31.1 31.1 31.1 31.4  MCHC 33.7 33.9 34.2 34.0  RDW 12.1 12.2 12.4 12.1    Chemistries  Recent Labs  Lab 04/21/23 0521 04/22/23 0513 04/23/23 0512 04/24/23 0500  NA 135 134* 135 134*  K 3.8 4.5 4.1 4.2  CL 103 102 101 101  CO2 22 23 24 25   GLUCOSE 130* 126* 117* 125*  BUN 31* 34* 42* 43*  CREATININE 0.98 0.92 1.08* 0.93  CALCIUM 10.2 10.3 10.1 9.9   ------------------------------------------------------------------------------------------------------------------ estimated creatinine clearance is 45.8 mL/min (by C-G formula based on SCr of 0.93 mg/dL). ------------------------------------------------------------------------------------------------------------------ No results for input(s): "HGBA1C" in the last 72 hours. ------------------------------------------------------------------------------------------------------------------ No results for input(s): "CHOL", "HDL", "LDLCALC", "TRIG", "CHOLHDL", "LDLDIRECT" in the last 72  hours. ------------------------------------------------------------------------------------------------------------------ No results for input(s): "TSH", "T4TOTAL", "T3FREE", "THYROIDAB" in the last 72 hours.  Invalid input(s): "FREET3" ------------------------------------------------------------------------------------------------------------------ No results for input(s): "VITAMINB12", "FOLATE", "FERRITIN", "TIBC", "IRON", "RETICCTPCT" in the last 72 hours.  Coagulation profile No results for input(s): "INR", "PROTIME" in the last 168 hours.  No results for input(s): "DDIMER" in the last 72 hours.  Cardiac Enzymes No results for input(s): "CKMB", "TROPONINI", "MYOGLOBIN" in the last 168 hours.  Invalid input(s): "CK" ------------------------------------------------------------------------------------------------------------------ Invalid input(s): "POCBNP"  Return for F/U, pain med refill, anxiety med refill, Ryenn Howeth PCP.   Time Spent in minutes  45 Time spent with patient included reviewing progress notes, labs, imaging studies, and discussing plan for follow up.   This patient was seen by Sallyanne Kuster, FNP-C in collaboration with Dr. Beverely Risen as a part of collaborative care agreement.    Sallyanne Kuster MSN, FNP-C on 04/27/2023 at 11:55 AM   **Disclaimer: This note may have been dictated with voice recognition software. Similar sounding words can inadvertently be transcribed and this note may contain transcription errors which may not have been corrected upon publication of note.**

## 2023-04-29 ENCOUNTER — Encounter: Payer: Self-pay | Admitting: Nurse Practitioner

## 2023-05-08 ENCOUNTER — Other Ambulatory Visit: Payer: Medicare Other

## 2023-05-15 ENCOUNTER — Telehealth: Payer: Self-pay | Admitting: Internal Medicine

## 2023-05-15 ENCOUNTER — Ambulatory Visit (INDEPENDENT_AMBULATORY_CARE_PROVIDER_SITE_OTHER): Payer: Medicare Other | Admitting: Physician Assistant

## 2023-05-15 VITALS — BP 130/80 | HR 82 | Temp 98.2°F | Resp 16 | Ht 61.0 in | Wt 140.6 lb

## 2023-05-15 DIAGNOSIS — J45901 Unspecified asthma with (acute) exacerbation: Secondary | ICD-10-CM

## 2023-05-15 DIAGNOSIS — G4733 Obstructive sleep apnea (adult) (pediatric): Secondary | ICD-10-CM | POA: Diagnosis not present

## 2023-05-15 DIAGNOSIS — J4540 Moderate persistent asthma, uncomplicated: Secondary | ICD-10-CM

## 2023-05-15 NOTE — Progress Notes (Signed)
[facility] [address] [place], [place] [address]  Pulmonary Sleep Medicine   Office Visit Note  Patient Name: Bonnie West DOB: 1951/03/17 MRN [id number]  Date of Service: 05/24/2023  Complaints/HPI: Pt is here for routine pulm follow up. Had asthma exacerbation and was hospitalized for 6 days. She was treated with several courses of prednisone and ABX. Had been treated for acute bronchitis the week prior to going to ED. Is starting to feel better. [place] and will [place]. Upper GI will also be rescheduled once feeling better.  ROS  General: (-) fever, (-) chills, (-) night sweats, (-) weakness Skin: (-) rashes, (-) itching,. Eyes: (-) visual changes, (-) redness, (-) itching. Nose and Sinuses: (-) nasal stuffiness or itchiness, (-) postnasal drip, (-) nosebleeds, (-) sinus trouble. Mouth and Throat: (-) sore throat, (-) hoarseness. Neck: (-) swollen glands, (-) enlarged thyroid, (-) neck pain. Respiratory: + cough, (-) bloody sputum, - shortness of breath, + wheezing. Cardiovascular: - ankle swelling, (-) chest pain. Lymphatic: (-) lymph node enlargement. Neurologic: (-) numbness, (-) tingling. Psychiatric: (-) anxiety, (-) depression   Current Medication: Outpatient Encounter Medications as of 05/15/2023  Medication Sig Note   acetaminophen (TYLENOL) 500 MG tablet Take 1,000 mg by mouth 2 (two) times daily as needed for mild pain or moderate pain.    albuterol (VENTOLIN HFA) 108 (90 Base) MCG/ACT inhaler Inhale 1-2 puffs into the lungs every 4 (four) hours as needed for wheezing or shortness of breath.    ALPRAZolam (XANAX) 0.25 MG tablet Take 1 tablet (0.25 mg total) by mouth 2 (two) times daily as needed for anxiety.    bisacodyl (DULCOLAX) 5 MG EC tablet Take 5-10 mg by mouth at bedtime as needed for moderate constipation. Two at night    cetirizine (ZYRTEC) 10 MG tablet Take 10 mg by mouth daily.     cholecalciferol (VITAMIN D) 1000 units tablet Take 1,000 Units by mouth at bedtime.    clindamycin (CLEOCIN T) 1 % external solution Apply 1 Application topically 2 (two) times daily as needed. For skin breakdown    cyclobenzaprine (FLEXERIL) 5 MG tablet Take 1-2 tablets (5-10 mg total) by mouth at bedtime. 04/18/2023: Takes as needed   docusate sodium (COLACE) 100 MG capsule Take 100-200 mg by mouth at bedtime as needed for mild constipation or moderate constipation.     estradiol (ESTRACE) 0.1 MG/GM vaginal cream INSERT 1 GRAM VAGINALLY TWICE WEEKLY AT BEDTIME    ezetimibe (ZETIA) 10 MG tablet Take 1 tablet by mouth daily. 04/18/2023: New med she hasn't started yet due to current illness   fenofibrate (TRICOR) 145 MG tablet Take 1 tablet (145 mg total) by mouth daily.    FLUoxetine (PROZAC) 40 MG capsule Take 1 capsule (40 mg total) by mouth daily.    fluticasone (FLOVENT HFA) 110 MCG/ACT inhaler USE 2 PUFFS INHALED TWICE DAILY    HYDROcodone-acetaminophen (NORCO/VICODIN) 5-325 MG tablet Take 1 tablet by mouth every 6 (six) hours as needed for severe pain or moderate pain.    ipratropium (ATROVENT HFA) 17 MCG/ACT inhaler INHALE 2 PUFFS 4 TIMES DAILY    ipratropium-albuterol (DUONEB) 0.5-2.5 (3) MG/3ML SOLN Take 3 mLs by nebulization every 4 (four) hours as needed (SOB, wheezing).    isometheptene-acetaminophen-dichloralphenazone (MIDRIN) 65-100-325 MG capsule Take 1 capsule by mouth 4 (four) times daily as needed for migraine. Maximum 5 capsules in 12 hours for migraine headaches, 8 capsules in 24 hours  for tension headaches.    levothyroxine (SYNTHROID) 100 MCG tablet TAKE 1 TABLET BY MOUTH EVERY DAY IN THE MORNING    lisinopril (ZESTRIL) 10 MG tablet Take 1 tablet (10 mg total) by mouth daily.    magnesium oxide (MAG-OX) 400 MG tablet Take 400 mg by mouth at bedtime.     montelukast (SINGULAIR) 10 MG tablet Take 1 tablet (10 mg total) by mouth daily.    omeprazole (PRILOSEC) 40 MG capsule TAKE 1  CAPSULE BY MOUTH EVERY DAY    polyethylene glycol powder (GLYCOLAX/MIRALAX) 17 GM/SCOOP powder Take 17 g by mouth every evening.    predniSONE (DELTASONE) 20 MG tablet 60 mg BID x 3 days, 40 mg BID x 3 days, 20mg  BID x 3 days, then stop    triamcinolone cream (KENALOG) 0.1 % Apply 1 application topically 2 (two) times daily as needed. For skin breakdown    vitamin B-12 (CYANOCOBALAMIN) 1000 MCG tablet Take 1,000 mcg by mouth daily.    zolpidem (AMBIEN) 5 MG tablet Take 0.5-1 tablets (2.5-5 mg total) by mouth at bedtime as needed for sleep.    No facility-administered encounter medications on file as of 05/15/2023.    Surgical History: Past Surgical History:  Procedure Laterality Date   BUNIONECTOMY     CATARACT EXTRACTION W/PHACO Right 10/11/2016   Procedure: CATARACT EXTRACTION PHACO AND INTRAOCULAR LENS PLACEMENT (IOC);  Surgeon: Galen Manila, MD;  Location: [facility] ORS;  Service: Ophthalmology;  Laterality: Right;  Lot# [id number] H [place]: 00:37.7 AP%: 18.1 CDE: 6.80   CATARACT EXTRACTION W/PHACO Left 11/08/2016   Procedure: CATARACT EXTRACTION PHACO AND INTRAOCULAR LENS PLACEMENT (IOC);  Surgeon: Galen Manila, MD;  Location: [facility] ORS;  Service: Ophthalmology;  Laterality: Left;  PACK LOT: [id number] H US:00:32 AP:44 CDE:6.46   COLONOSCOPY     COLONOSCOPY WITH PROPOFOL N/A 11/26/2015   Procedure: COLONOSCOPY WITH PROPOFOL;  Surgeon: Scot Jun, MD;  Location: [facility];  Service: Endoscopy;  Laterality: N/A;   EYE SURGERY  07/23/2021   EYE SURGERY Left 09/24/2021   strabusmis, double vision   TONSILLECTOMY      Medical History: Past Medical History:  Diagnosis Date   Allergic rhinitis    Anxiety    Asthma    Blood transfusion without reported diagnosis    Depression    Diverticulosis    Dyspnea    Esophagitis    GERD (gastroesophageal reflux disease)    Headache    Heart murmur    Hyperlipemia    Hypertension    Hypothyroidism    Obesity    Osteopenia     Palpitations    Pneumothorax    Sleep apnea     Family History: Family History  Problem Relation Age of Onset   Breast cancer Maternal Grandmother    Stroke Maternal Grandmother    Bladder Cancer Father    Prostate cancer Father    Heart attack Father    Aortic aneurysm Father    Congestive Heart Failure Father    Colon cancer Paternal Grandmother    Aneurysm Paternal Grandmother    Stroke Paternal Grandmother    Congestive Heart Failure Paternal Grandmother    Dementia Mother    Stroke Mother    Dementia Maternal Grandfather    Stroke Maternal Grandfather    Dementia Paternal Grandfather    Stroke Paternal Grandfather     Social History: Social History   Socioeconomic History   Marital status: Married    Spouse name: Not on file   Number  of children: Not on file   Years of education: Not on file   Highest education level: Not on file  Occupational History   Not on file  Tobacco Use   Smoking status: Former   Smokeless tobacco: Never  Vaping Use   Vaping Use: Never used  Substance and Sexual Activity   Alcohol use: No   Drug use: No   Sexual activity: Not Currently    Partners: Male    Birth control/protection: Post-menopausal  Other Topics Concern   Not on file  Social History Narrative   Not on file   Social Determinants of Health   Financial Resource Strain: Low Risk  (03/23/2021)   Overall Financial Resource Strain (CARDIA)    Difficulty of Paying Living Expenses: Not very hard  Food Insecurity: No Food Insecurity (04/19/2023)   Hunger Vital Sign    Worried About Running Out of Food in the Last Year: Never true    Ran Out of Food in the Last Year: Never true  Transportation Needs: No Transportation Needs (04/19/2023)   PRAPARE - Administrator, Civil Service (Medical): No    Lack of Transportation (Non-Medical): No  Physical Activity: Not on file  Stress: Not on file  Social Connections: Not on file  Intimate Partner Violence: Not At  Risk (04/19/2023)   Humiliation, Afraid, Rape, and Kick questionnaire    Fear of Current or Ex-Partner: No    Emotionally Abused: No    Physically Abused: No    Sexually Abused: No    Vital Signs: Blood pressure 130/80, pulse 82, temperature 98.2 F (36.8 C), resp. rate 16, height 5\' 1"  (1.549 m), weight 140 lb 9.6 oz (63.8 kg), SpO2 97 %.  Examination: General Appearance: The patient is well-developed, well-nourished, and in no distress. Skin: Gross inspection of skin unremarkable. Head: normocephalic, no gross deformities. Eyes: no gross deformities noted. ENT: ears appear grossly normal no exudates. Neck: Supple. No thyromegaly. No LAD. Respiratory: No rhonchi. Cardiovascular: Normal S1 and S2 without murmur or rub. Extremities: No cyanosis. pulses are equal. Neurologic: Alert and oriented. No involuntary movements.  LABS: Recent Results (from the past 2160 hour(s))  CBC     Status: Abnormal   Collection Time: 04/18/23 10:44 AM  Result Value Ref Range   WBC 11.9 (H) 4.0 - 10.5 K/uL   RBC 4.59 3.87 - 5.11 MIL/uL   Hemoglobin 14.4 12.0 - 15.0 g/dL   HCT 60.4 54.0 - 98.1 %   MCV 91.3 80.0 - 100.0 fL   MCH 31.4 26.0 - 34.0 pg   MCHC 34.4 30.0 - 36.0 g/dL   RDW 19.1 47.8 - 29.5 %   Platelets 313 150 - 400 K/uL   nRBC 0.0 0.0 - 0.2 %    Comment: Performed at [facility], [address] [place], [place] [address]  Basic metabolic panel     Status: Abnormal   Collection Time: 04/18/23 10:44 AM  Result Value Ref Range   Sodium 138 135 - 145 mmol/L   Potassium 3.6 3.5 - 5.1 mmol/L   Chloride 106 98 - 111 mmol/L   CO2 24 22 - 32 mmol/L   Glucose, Bld 131 (H) 70 - 99 mg/dL    Comment: Glucose reference range applies only to samples taken after fasting for at least 8 hours.   BUN 22 8 - 23 mg/dL   Creatinine, Ser 8.65 (H) 0.44 - 1.00 mg/dL   Calcium 9.4 8.9 - 78.4 mg/dL  GFR, Estimated 52 (L) >60 mL/min    Comment: (NOTE) Calculated using the CKD-EPI Creatinine  Equation (2021)    Anion gap 8 5 - 15    Comment: Performed at [facility], [address] [place], [place] [address]  Blood gas, venous     Status: Abnormal   Collection Time: 04/18/23 11:21 AM  Result Value Ref Range   pH, Ven 7.48 (H) 7.25 - 7.43   pCO2, Ven 35 (L) 44 - 60 mmHg   pO2, Ven 63 (H) 32 - 45 mmHg   Bicarbonate 26.1 20.0 - 28.0 mmol/L   Acid-Base Excess 2.8 (H) 0.0 - 2.0 mmol/L   O2 Saturation 94.1 %   Patient temperature 37.0    Collection site VEIN     Comment: Performed at [facility], [address], [place], [place] [address]  Resp panel by RT-PCR (RSV, Flu A&B, Covid) Anterior Nasal Swab     Status: None   Collection Time: 04/18/23  2:32 PM   Specimen: Anterior Nasal Swab  Result Value Ref Range   SARS Coronavirus 2 by RT PCR NEGATIVE NEGATIVE    Comment: (NOTE) SARS-CoV-2 target nucleic acids are NOT DETECTED.  The SARS-CoV-2 RNA is generally detectable in upper respiratory specimens during the acute phase of infection. The lowest concentration of SARS-CoV-2 viral copies this assay can detect is 138 copies/mL. A negative result does not preclude SARS-Cov-2 infection and should not be used as the sole basis for treatment or other patient management decisions. A negative result may occur with  improper specimen collection/handling, submission of specimen other than nasopharyngeal swab, presence of viral mutation(s) within the areas targeted by this assay, and inadequate number of viral copies(<138 copies/mL). A negative result must be combined with clinical observations, patient history, and epidemiological information. The expected result is Negative.  Fact Sheet for Patients:  BloggerCourse.com  Fact Sheet for Healthcare Providers:  SeriousBroker.it  This test is no t yet approved or cleared by the [place] FDA and  has been authorized for detection and/or diagnosis of  SARS-CoV-2 by FDA under an Emergency Use Authorization (EUA). This EUA will remain  in effect (meaning this test can be used) for the duration of the COVID-19 declaration under Section 564(b)(1) of the Act, 21 U.S.C.section 360bbb-3(b)(1), unless the authorization is terminated  or revoked sooner.       Influenza A by PCR NEGATIVE NEGATIVE   Influenza B by PCR NEGATIVE NEGATIVE    Comment: (NOTE) The Xpert Xpress SARS-CoV-2/FLU/RSV plus assay is intended as an aid in the diagnosis of influenza from Nasopharyngeal swab specimens and should not be used as a sole basis for treatment. Nasal washings and aspirates are unacceptable for Xpert Xpress SARS-CoV-2/FLU/RSV testing.  Fact Sheet for Patients: BloggerCourse.com  Fact Sheet for Healthcare Providers: SeriousBroker.it  This test is not yet approved or cleared by the [place] FDA and has been authorized for detection and/or diagnosis of SARS-CoV-2 by FDA under an Emergency Use Authorization (EUA). This EUA will remain in effect (meaning this test can be used) for the duration of the COVID-19 declaration under Section 564(b)(1) of the Act, 21 U.S.C. section 360bbb-3(b)(1), unless the authorization is terminated or revoked.     Resp Syncytial Virus by PCR NEGATIVE NEGATIVE    Comment: (NOTE) Fact Sheet for Patients: BloggerCourse.com  Fact Sheet for Healthcare Providers: SeriousBroker.it  This test is not yet approved or cleared by the [place] FDA and has been authorized for detection and/or  diagnosis of SARS-CoV-2 by FDA under an Emergency Use Authorization (EUA). This EUA will remain in effect (meaning this test can be used) for the duration of the COVID-19 declaration under Section 564(b)(1) of the Act, 21 U.S.C. section 360bbb-3(b)(1), unless the authorization is terminated or revoked.  Performed at [facility], [address] [place], [place] [address]   CBC     Status: Abnormal   Collection Time: 04/19/23  6:24 AM  Result Value Ref Range   WBC 15.1 (H) 4.0 - 10.5 K/uL   RBC 4.34 3.87 - 5.11 MIL/uL   Hemoglobin 13.6 12.0 - 15.0 g/dL   HCT 60.4 54.0 - 98.1 %   MCV 92.9 80.0 - 100.0 fL   MCH 31.3 26.0 - 34.0 pg   MCHC 33.7 30.0 - 36.0 g/dL   RDW 19.1 47.8 - 29.5 %   Platelets 300 150 - 400 K/uL   nRBC 0.0 0.0 - 0.2 %    Comment: Performed at [facility], [address] [place], [place] [address]  Comprehensive metabolic panel     Status: Abnormal   Collection Time: 04/19/23  6:24 AM  Result Value Ref Range   Sodium 138 135 - 145 mmol/L   Potassium 3.4 (L) 3.5 - 5.1 mmol/L   Chloride 105 98 - 111 mmol/L   CO2 22 22 - 32 mmol/L   Glucose, Bld 116 (H) 70 - 99 mg/dL    Comment: Glucose reference range applies only to samples taken after fasting for at least 8 hours.   BUN 18 8 - 23 mg/dL   Creatinine, Ser 8.65 0.44 - 1.00 mg/dL   Calcium 9.8 8.9 - 78.4 mg/dL   Total Protein 7.2 6.5 - 8.1 g/dL   Albumin 4.0 3.5 - 5.0 g/dL   AST 31 15 - 41 U/L   ALT 15 0 - 44 U/L   Alkaline Phosphatase 42 38 - 126 U/L   Total Bilirubin 0.6 0.3 - 1.2 mg/dL   GFR, Estimated >69 >62 mL/min    Comment: (NOTE) Calculated using the CKD-EPI Creatinine Equation (2021)    Anion gap 11 5 - 15    Comment: Performed at [facility], [address] [place], [place] [address]  CBC     Status: Abnormal   Collection Time: 04/20/23  4:39 AM  Result Value Ref Range   WBC 13.0 (H) 4.0 - 10.5 K/uL   RBC 4.12 3.87 - 5.11 MIL/uL   Hemoglobin 13.0 12.0 - 15.0 g/dL   HCT 13.2 44.0 - 10.2 %   MCV 93.2 80.0 - 100.0 fL   MCH 31.6 26.0 - 34.0 pg   MCHC 33.9 30.0 - 36.0 g/dL   RDW 72.5 36.6 - 44.0 %   Platelets 293 150 - 400 K/uL   nRBC 0.0 0.0 - 0.2 %    Comment: Performed at [facility], [address] [place], [place] [address]  Basic metabolic panel     Status: None    Collection Time: 04/20/23  4:39 AM  Result Value Ref Range   Sodium 138 135 - 145 mmol/L   Potassium 3.8 3.5 - 5.1 mmol/L   Chloride 106 98 - 111 mmol/L   CO2 24 22 - 32 mmol/L   Glucose, Bld 91 70 - 99 mg/dL    Comment: Glucose reference range applies only to samples taken after fasting for at least 8 hours.   BUN 23 8 - 23 mg/dL   Creatinine, Ser 5.95  0.44 - 1.00 mg/dL   Calcium 9.7 8.9 - 91.4 mg/dL   GFR, Estimated >78 >29 mL/min    Comment: (NOTE) Calculated using the CKD-EPI Creatinine Equation (2021)    Anion gap 8 5 - 15    Comment: Performed at [facility], [address] [place], [place] [address]  CBC     Status: Abnormal   Collection Time: 04/21/23  5:21 AM  Result Value Ref Range   WBC 13.7 (H) 4.0 - 10.5 K/uL   RBC 4.40 3.87 - 5.11 MIL/uL   Hemoglobin 13.7 12.0 - 15.0 g/dL   HCT 08.6 57.8 - 46.9 %   MCV 92.3 80.0 - 100.0 fL   MCH 31.1 26.0 - 34.0 pg   MCHC 33.7 30.0 - 36.0 g/dL   RDW 62.9 52.8 - 41.3 %   Platelets 345 150 - 400 K/uL   nRBC 0.0 0.0 - 0.2 %    Comment: Performed at [facility], [address], [place], [place] [address]  Basic metabolic panel     Status: Abnormal   Collection Time: 04/21/23  5:21 AM  Result Value Ref Range   Sodium 135 135 - 145 mmol/L   Potassium 3.8 3.5 - 5.1 mmol/L   Chloride 103 98 - 111 mmol/L   CO2 22 22 - 32 mmol/L   Glucose, Bld 130 (H) 70 - 99 mg/dL    Comment: Glucose reference range applies only to samples taken after fasting for at least 8 hours.   BUN 31 (H) 8 - 23 mg/dL   Creatinine, Ser 0.27 0.44 - 1.00 mg/dL   Calcium 25.3 8.9 - 66.4 mg/dL   GFR, Estimated >40 >34 mL/min    Comment: (NOTE) Calculated using the CKD-EPI Creatinine Equation (2021)    Anion gap 10 5 - 15    Comment: Performed at [facility], [address] [place], [place] [address]  CBC     Status: Abnormal   Collection Time: 04/22/23  5:13 AM  Result Value Ref Range   WBC 13.8 (H) 4.0 - 10.5 K/uL   RBC  4.40 3.87 - 5.11 MIL/uL   Hemoglobin 13.7 12.0 - 15.0 g/dL   HCT 56.3 87.5 - 64.3 %   MCV 91.8 80.0 - 100.0 fL   MCH 31.1 26.0 - 34.0 pg   MCHC 33.9 30.0 - 36.0 g/dL   RDW 32.9 51.8 - 84.1 %   Platelets 336 150 - 400 K/uL   nRBC 0.0 0.0 - 0.2 %    Comment: Performed at [facility], [address], [place], [place] [address]  Basic metabolic panel     Status: Abnormal   Collection Time: 04/22/23  5:13 AM  Result Value Ref Range   Sodium 134 (L) 135 - 145 mmol/L   Potassium 4.5 3.5 - 5.1 mmol/L   Chloride 102 98 - 111 mmol/L   CO2 23 22 - 32 mmol/L   Glucose, Bld 126 (H) 70 - 99 mg/dL    Comment: Glucose reference range applies only to samples taken after fasting for at least 8 hours.   BUN 34 (H) 8 - 23 mg/dL   Creatinine, Ser 0.16 0.44 - 1.00 mg/dL   Calcium 01.0 8.9 - 93.2 mg/dL   GFR, Estimated >35 >57 mL/min    Comment: (NOTE) Calculated using the CKD-EPI Creatinine Equation (2021)    Anion gap 9 5 - 15    Comment: Performed at [facility], [address] [place],  [place] [address]  CBC     Status: Abnormal   Collection Time: 04/23/23  5:12 AM  Result Value Ref Range   WBC 19.4 (H) 4.0 - 10.5 K/uL   RBC 4.27 3.87 - 5.11 MIL/uL   Hemoglobin 13.3 12.0 - 15.0 g/dL   HCT 60.4 54.0 - 98.1 %   MCV 91.1 80.0 - 100.0 fL   MCH 31.1 26.0 - 34.0 pg   MCHC 34.2 30.0 - 36.0 g/dL   RDW 19.1 47.8 - 29.5 %   Platelets 341 150 - 400 K/uL   nRBC 0.0 0.0 - 0.2 %    Comment: Performed at [facility], [address], [place], [place] [address]  Basic metabolic panel     Status: Abnormal   Collection Time: 04/23/23  5:12 AM  Result Value Ref Range   Sodium 135 135 - 145 mmol/L   Potassium 4.1 3.5 - 5.1 mmol/L   Chloride 101 98 - 111 mmol/L   CO2 24 22 - 32 mmol/L   Glucose, Bld 117 (H) 70 - 99 mg/dL    Comment: Glucose reference range applies only to samples taken after fasting for at least 8 hours.   BUN 42 (H) 8 - 23 mg/dL   Creatinine, Ser 8.65  (H) 0.44 - 1.00 mg/dL   Calcium 78.4 8.9 - 69.6 mg/dL   GFR, Estimated 54 (L) >60 mL/min    Comment: (NOTE) Calculated using the CKD-EPI Creatinine Equation (2021)    Anion gap 10 5 - 15    Comment: Performed at [facility], [address] [place], [place] [address]  CBC     Status: Abnormal   Collection Time: 04/24/23  5:00 AM  Result Value Ref Range   WBC 15.8 (H) 4.0 - 10.5 K/uL   RBC 4.33 3.87 - 5.11 MIL/uL   Hemoglobin 13.6 12.0 - 15.0 g/dL   HCT 41.3 24.4 - 01.0 %   MCV 92.4 80.0 - 100.0 fL   MCH 31.4 26.0 - 34.0 pg   MCHC 34.0 30.0 - 36.0 g/dL   RDW 27.2 53.6 - 64.4 %   Platelets 321 150 - 400 K/uL   nRBC 0.0 0.0 - 0.2 %    Comment: Performed at [facility], [address], [place], [place] [address]  Basic metabolic panel     Status: Abnormal   Collection Time: 04/24/23  5:00 AM  Result Value Ref Range   Sodium 134 (L) 135 - 145 mmol/L   Potassium 4.2 3.5 - 5.1 mmol/L   Chloride 101 98 - 111 mmol/L   CO2 25 22 - 32 mmol/L   Glucose, Bld 125 (H) 70 - 99 mg/dL    Comment: Glucose reference range applies only to samples taken after fasting for at least 8 hours.   BUN 43 (H) 8 - 23 mg/dL   Creatinine, Ser 2.59 0.44 - 1.00 mg/dL   Calcium 9.9 8.9 - 56.3 mg/dL   GFR, Estimated >87 >56 mL/min    Comment: (NOTE) Calculated using the CKD-EPI Creatinine Equation (2021)    Anion gap 8 5 - 15    Comment: Performed at [facility], [address] [place], [place] [address]    Radiology: [facility] Chest Port 1 View  Result Date: 04/18/2023 CLINICAL DATA:  One-week history of shortness of breath EXAM: PORTABLE CHEST 1 VIEW COMPARISON:  Chest radiograph dated 04/02/2022 FINDINGS: Normal lung volumes. No focal consolidations. No pleural effusion or pneumothorax. The heart size and mediastinal  contours are within normal limits. No acute osseous abnormality. IMPRESSION: No active disease. Electronically Signed   By: Agustin Cree M.D.   On: 04/18/2023 11:34     No results found.  No results found.    Assessment and Plan: Patient Active Problem List   Diagnosis Date Noted   Asthma exacerbation 04/18/2023   Vaginal atrophy 08/20/2020   Diastolic dysfunction 07/15/2020   OSA on CPAP 07/15/2020   Tightness in chest 05/20/2020   Hypercalcemia 05/20/2020   Primary insomnia 05/20/2020   Acute non-recurrent pansinusitis 02/12/2020   Encounter for general adult medical examination with abnormal findings 11/10/2019   Gastroesophageal reflux disease without esophagitis 11/10/2019   Dysuria 11/10/2019   Acute otitis externa of left ear 07/15/2019   Irritable bowel syndrome with constipation 07/15/2019   Chronic midline low back pain without sciatica 04/27/2019   Allergic rhinitis due to pollen 04/27/2019   Essential hypertension, benign 04/27/2019   GAD (generalized anxiety disorder) 09/01/2018   Chronic migraine without aura without status migrainosus, not intractable 09/01/2018   Encounter for long-term (current) use of medications 09/01/2018   Asthma without status asthmaticus 01/30/2018   Hyperlipidemia, unspecified 01/30/2018   Hypertension 01/30/2018   Obesity, unspecified 01/30/2018   Hypothyroidism 01/30/2018   Fatty infiltration of liver 12/10/2015   Abdominal pain, LLQ (left lower quadrant) 11/03/2015   History of adenomatous polyp of colon 11/03/2015   Hallux valgus, left 09/18/2014   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   Shoulder arthritis 12/19/2013    1. Exacerbation of asthma, unspecified asthma severity, unspecified whether persistent Improving since hospitalization  2. Moderate persistent asthma without status asthmaticus without complication Continue inhalers as prescribed  3. OSA (obstructive sleep apnea) Will reschedule titration once feeling better and will also schedule upper GI prior to follow up with Dr. Adline Mango Counseling: I have discussed the findings of the evaluation and examination with  Bonnie West.  I have also discussed any further diagnostic evaluation thatmay be needed or ordered today. Bonnie West verbalizes understanding of the findings of todays visit. We also reviewed her medications today and discussed drug interactions and side effects including but not limited excessive drowsiness and altered mental states. We also discussed that there is always a risk not just to her but also people around her. she has been encouraged to call the office with any questions or concerns that should arise related to todays visit.  No orders of the defined types were placed in this encounter.    Time spent: 14  I have personally obtained a history, examined the patient, evaluated laboratory and imaging results, formulated the assessment and plan and placed orders. This patient was seen by Lynn Ito, PA-C in collaboration with Dr. Freda Munro as a part of collaborative care agreement.     Yevonne Pax, MD [facility] and Critical Care Sleep medicine

## 2023-05-15 NOTE — Telephone Encounter (Signed)
Patient will call back when ready to schedule cpap titration, UGI and appointment with DSK-[name]

## 2023-06-10 ENCOUNTER — Other Ambulatory Visit: Payer: Self-pay | Admitting: Nurse Practitioner

## 2023-06-10 DIAGNOSIS — K219 Gastro-esophageal reflux disease without esophagitis: Secondary | ICD-10-CM

## 2023-06-16 ENCOUNTER — Other Ambulatory Visit: Payer: Self-pay | Admitting: Nurse Practitioner

## 2023-06-16 DIAGNOSIS — Z760 Encounter for issue of repeat prescription: Secondary | ICD-10-CM

## 2023-06-28 DIAGNOSIS — E7849 Other hyperlipidemia: Secondary | ICD-10-CM | POA: Diagnosis not present

## 2023-06-28 DIAGNOSIS — Z789 Other specified health status: Secondary | ICD-10-CM | POA: Diagnosis not present

## 2023-06-28 DIAGNOSIS — I471 Supraventricular tachycardia, unspecified: Secondary | ICD-10-CM | POA: Diagnosis not present

## 2023-06-28 DIAGNOSIS — R002 Palpitations: Secondary | ICD-10-CM | POA: Diagnosis not present

## 2023-06-28 DIAGNOSIS — I2510 Atherosclerotic heart disease of native coronary artery without angina pectoris: Secondary | ICD-10-CM | POA: Diagnosis not present

## 2023-07-27 ENCOUNTER — Telehealth: Payer: Self-pay | Admitting: Nurse Practitioner

## 2023-07-27 ENCOUNTER — Encounter: Payer: Self-pay | Admitting: Nurse Practitioner

## 2023-07-27 ENCOUNTER — Ambulatory Visit (INDEPENDENT_AMBULATORY_CARE_PROVIDER_SITE_OTHER): Payer: Medicare Other | Admitting: Nurse Practitioner

## 2023-07-27 VITALS — BP 136/84 | HR 66 | Temp 98.1°F | Resp 16 | Ht 61.0 in | Wt 139.6 lb

## 2023-07-27 DIAGNOSIS — J0110 Acute frontal sinusitis, unspecified: Secondary | ICD-10-CM

## 2023-07-27 DIAGNOSIS — R198 Other specified symptoms and signs involving the digestive system and abdomen: Secondary | ICD-10-CM

## 2023-07-27 DIAGNOSIS — R1084 Generalized abdominal pain: Secondary | ICD-10-CM

## 2023-07-27 DIAGNOSIS — Z79899 Other long term (current) drug therapy: Secondary | ICD-10-CM

## 2023-07-27 DIAGNOSIS — G8929 Other chronic pain: Secondary | ICD-10-CM

## 2023-07-27 DIAGNOSIS — J4540 Moderate persistent asthma, uncomplicated: Secondary | ICD-10-CM

## 2023-07-27 DIAGNOSIS — F411 Generalized anxiety disorder: Secondary | ICD-10-CM

## 2023-07-27 DIAGNOSIS — Z760 Encounter for issue of repeat prescription: Secondary | ICD-10-CM

## 2023-07-27 DIAGNOSIS — R1314 Dysphagia, pharyngoesophageal phase: Secondary | ICD-10-CM

## 2023-07-27 DIAGNOSIS — K219 Gastro-esophageal reflux disease without esophagitis: Secondary | ICD-10-CM

## 2023-07-27 MED ORDER — ZOLPIDEM TARTRATE 5 MG PO TABS
2.5000 mg | ORAL_TABLET | Freq: Every evening | ORAL | 0 refills | Status: DC | PRN
Start: 2023-07-27 — End: 2023-11-09

## 2023-07-27 MED ORDER — ALBUTEROL SULFATE HFA 108 (90 BASE) MCG/ACT IN AERS
1.0000 | INHALATION_SPRAY | RESPIRATORY_TRACT | 5 refills | Status: DC | PRN
Start: 2023-07-27 — End: 2024-08-16

## 2023-07-27 MED ORDER — HYDROCODONE-ACETAMINOPHEN 5-325 MG PO TABS
1.0000 | ORAL_TABLET | Freq: Four times a day (QID) | ORAL | 0 refills | Status: DC | PRN
Start: 2023-07-27 — End: 2023-07-27

## 2023-07-27 MED ORDER — FENOFIBRATE 145 MG PO TABS: 145.0000 mg | ORAL_TABLET | Freq: Every day | ORAL | 1 refills | Status: DC

## 2023-07-27 MED ORDER — AZITHROMYCIN 250 MG PO TABS
ORAL_TABLET | ORAL | 0 refills | Status: AC
Start: 2023-07-27 — End: 2023-08-01

## 2023-07-27 MED ORDER — ALPRAZOLAM 0.25 MG PO TABS
0.2500 mg | ORAL_TABLET | Freq: Two times a day (BID) | ORAL | 2 refills | Status: DC | PRN
Start: 2023-07-27 — End: 2023-11-09

## 2023-07-27 MED ORDER — HYDROCODONE-ACETAMINOPHEN 5-325 MG PO TABS
1.0000 | ORAL_TABLET | Freq: Four times a day (QID) | ORAL | 0 refills | Status: DC | PRN
Start: 2023-07-27 — End: 2023-11-09

## 2023-07-27 NOTE — Telephone Encounter (Signed)
Notified patient of ugi appointment date, arrival time, location and npo after midnight-[name]

## 2023-07-27 NOTE — Progress Notes (Signed)
[facility] [address] [place], [place] [address]  Internal MEDICINE  Office Visit Note  Patient Name: Bonnie West  [id number]  [id number]  Date of Service: 07/27/2023  Chief Complaint  Patient presents with   Depression   Gastroesophageal Reflux   Hypertension   Hyperlipidemia   Follow-up    Review new med    HPI Bonnie West presents for a follow-up visit for GI issues, and sinus infection.  Alternating constipation and diarrhea -- takes laxatives and miralax as needed. Her bowels are either hard as rocks or loose diarrhea. Has persistent abdominal cramping on the left side of the abdomen. This has been happening for several months. Wants to see GI and possibly have endoscopy done.  She spoke with her husband and they are working on staying together so things are improving.  Sinus infection -- reports sinus drainage, runny nose, cough, nasal congestion, sinus pressure, sore throat    Current Medication: Outpatient Encounter Medications as of 07/27/2023  Medication Sig Note   acetaminophen (TYLENOL) 500 MG tablet Take 1,000 mg by mouth 2 (two) times daily as needed for mild pain or moderate pain.    azithromycin (ZITHROMAX) 250 MG tablet Take 2 tablets on day 1, then 1 tablet daily on days 2 through 5    bisacodyl (DULCOLAX) 5 MG EC tablet Take 5-10 mg by mouth at bedtime as needed for moderate constipation. Two at night    cetirizine (ZYRTEC) 10 MG tablet Take 10 mg by mouth daily.    cholecalciferol (VITAMIN D) 1000 units tablet Take 1,000 Units by mouth at bedtime.    clindamycin (CLEOCIN T) 1 % external solution Apply 1 Application topically 2 (two) times daily as needed. For skin breakdown    cyclobenzaprine (FLEXERIL) 5 MG tablet Take 1-2 tablets (5-10 mg total) by mouth at bedtime. 04/18/2023: Takes as needed   docusate sodium (COLACE) 100 MG capsule Take 100-200 mg by mouth at bedtime as needed for mild constipation or moderate constipation.     estradiol  (ESTRACE) 0.1 MG/GM vaginal cream INSERT 1 GRAM VAGINALLY TWICE WEEKLY AT BEDTIME    ezetimibe (ZETIA) 10 MG tablet Take 1 tablet by mouth daily. 04/18/2023: New med she hasn't started yet due to current illness   FLUoxetine (PROZAC) 40 MG capsule TAKE 1 CAPSULE (40 MG TOTAL) BY MOUTH DAILY.    fluticasone (FLOVENT HFA) 110 MCG/ACT inhaler USE 2 PUFFS INHALED TWICE DAILY    ipratropium (ATROVENT HFA) 17 MCG/ACT inhaler INHALE 2 PUFFS 4 TIMES DAILY    isometheptene-acetaminophen-dichloralphenazone (MIDRIN) 65-100-325 MG capsule Take 1 capsule by mouth 4 (four) times daily as needed for migraine. Maximum 5 capsules in 12 hours for migraine headaches, 8 capsules in 24 hours for tension headaches.    levothyroxine (SYNTHROID) 100 MCG tablet TAKE 1 TABLET BY MOUTH EVERY DAY IN THE MORNING    lisinopril (ZESTRIL) 10 MG tablet Take 1 tablet (10 mg total) by mouth daily.    magnesium oxide (MAG-OX) 400 MG tablet Take 400 mg by mouth at bedtime.     montelukast (SINGULAIR) 10 MG tablet Take 1 tablet (10 mg total) by mouth daily.    omeprazole (PRILOSEC) 40 MG capsule TAKE 1 CAPSULE BY MOUTH EVERY DAY    polyethylene glycol powder (GLYCOLAX/MIRALAX) 17 GM/SCOOP powder Take 17 g by mouth every evening.    predniSONE (DELTASONE) 20 MG tablet 60 mg BID x 3 days, 40 mg BID x 3 days, 20mg  BID x 3 days, then stop  triamcinolone cream (KENALOG) 0.1 % Apply 1 application topically 2 (two) times daily as needed. For skin breakdown    vitamin B-12 (CYANOCOBALAMIN) 1000 MCG tablet Take 1,000 mcg by mouth daily.    [DISCONTINUED] albuterol (VENTOLIN HFA) 108 (90 Base) MCG/ACT inhaler Inhale 1-2 puffs into the lungs every 4 (four) hours as needed for wheezing or shortness of breath.    [DISCONTINUED] ALPRAZolam (XANAX) 0.25 MG tablet Take 1 tablet (0.25 mg total) by mouth 2 (two) times daily as needed for anxiety.    [DISCONTINUED] fenofibrate (TRICOR) 145 MG tablet Take 1 tablet (145 mg total) by mouth daily.     [DISCONTINUED] HYDROcodone-acetaminophen (NORCO/VICODIN) 5-325 MG tablet Take 1 tablet by mouth every 6 (six) hours as needed for severe pain or moderate pain.    [DISCONTINUED] zolpidem (AMBIEN) 5 MG tablet Take 0.5-1 tablets (2.5-5 mg total) by mouth at bedtime as needed for sleep.    albuterol (VENTOLIN HFA) 108 (90 Base) MCG/ACT inhaler Inhale 1-2 puffs into the lungs every 4 (four) hours as needed for wheezing or shortness of breath.    ALPRAZolam (XANAX) 0.25 MG tablet Take 1 tablet (0.25 mg total) by mouth 2 (two) times daily as needed for anxiety.    fenofibrate (TRICOR) 145 MG tablet Take 1 tablet (145 mg total) by mouth daily.    HYDROcodone-acetaminophen (NORCO/VICODIN) 5-325 MG tablet Take 1 tablet by mouth every 6 (six) hours as needed for severe pain or moderate pain.    ipratropium-albuterol (DUONEB) 0.5-2.5 (3) MG/3ML SOLN Take 3 mLs by nebulization every 4 (four) hours as needed (SOB, wheezing).    zolpidem (AMBIEN) 5 MG tablet Take 0.5-1 tablets (2.5-5 mg total) by mouth at bedtime as needed for sleep.    [DISCONTINUED] HYDROcodone-acetaminophen (NORCO/VICODIN) 5-325 MG tablet Take 1 tablet by mouth every 6 (six) hours as needed for severe pain or moderate pain.    No facility-administered encounter medications on file as of 07/27/2023.    Surgical History: Past Surgical History:  Procedure Laterality Date   BUNIONECTOMY     CATARACT EXTRACTION W/PHACO Right 10/11/2016   Procedure: CATARACT EXTRACTION PHACO AND INTRAOCULAR LENS PLACEMENT (IOC);  Surgeon: Galen Manila, MD;  Location: [facility] ORS;  Service: Ophthalmology;  Laterality: Right;  Lot# [id number] H [place]: 00:37.7 AP%: 18.1 CDE: 6.80   CATARACT EXTRACTION W/PHACO Left 11/08/2016   Procedure: CATARACT EXTRACTION PHACO AND INTRAOCULAR LENS PLACEMENT (IOC);  Surgeon: Galen Manila, MD;  Location: [facility] ORS;  Service: Ophthalmology;  Laterality: Left;  PACK LOT: [id number] H US:00:32 AP:44 CDE:6.46   COLONOSCOPY      COLONOSCOPY WITH PROPOFOL N/A 11/26/2015   Procedure: COLONOSCOPY WITH PROPOFOL;  Surgeon: Scot Jun, MD;  Location: [facility] ENDOSCOPY;  Service: Endoscopy;  Laterality: N/A;   EYE SURGERY  07/23/2021   EYE SURGERY Left 09/24/2021   strabusmis, double vision   TONSILLECTOMY      Medical History: Past Medical History:  Diagnosis Date   Allergic rhinitis    Anxiety    Asthma    Blood transfusion without reported diagnosis    Depression    Diverticulosis    Dyspnea    Esophagitis    GERD (gastroesophageal reflux disease)    Headache    Heart murmur    Hyperlipemia    Hypertension    Hypothyroidism    Obesity    Osteopenia    Palpitations    Pneumothorax    Sleep apnea     Family History: Family History  Problem Relation Age of Onset  Breast cancer Maternal Grandmother    Stroke Maternal Grandmother    Bladder Cancer Father    Prostate cancer Father    Heart attack Father    Aortic aneurysm Father    Congestive Heart Failure Father    Colon cancer Paternal Grandmother    Aneurysm Paternal Grandmother    Stroke Paternal Grandmother    Congestive Heart Failure Paternal Grandmother    Dementia Mother    Stroke Mother    Dementia Maternal Grandfather    Stroke Maternal Grandfather    Dementia Paternal Grandfather    Stroke Paternal Grandfather     Social History   Socioeconomic History   Marital status: Married    Spouse name: Not on file   Number of children: Not on file   Years of education: Not on file   Highest education level: Not on file  Occupational History   Not on file  Tobacco Use   Smoking status: Former   Smokeless tobacco: Never  Vaping Use   Vaping status: Never Used  Substance and Sexual Activity   Alcohol use: No   Drug use: No   Sexual activity: Not Currently    Partners: Male    Birth control/protection: Post-menopausal  Other Topics Concern   Not on file  Social History Narrative   Not on file   Social Determinants of  Health   Financial Resource Strain: Low Risk  (03/23/2021)   Overall Financial Resource Strain (CARDIA)    Difficulty of Paying Living Expenses: Not very hard  Food Insecurity: No Food Insecurity (04/19/2023)   Hunger Vital Sign    Worried About Running Out of Food in the Last Year: Never true    Ran Out of Food in the Last Year: Never true  Transportation Needs: No Transportation Needs (04/19/2023)   PRAPARE - Administrator, Civil Service (Medical): No    Lack of Transportation (Non-Medical): No  Physical Activity: Not on file  Stress: Not on file  Social Connections: Not on file  Intimate Partner Violence: Not At Risk (04/19/2023)   Humiliation, Afraid, Rape, and Kick questionnaire    Fear of Current or Ex-Partner: No    Emotionally Abused: No    Physically Abused: No    Sexually Abused: No      Review of Systems  Constitutional:  Positive for appetite change and fatigue. Negative for chills and fever.  HENT:  Positive for postnasal drip, rhinorrhea, sinus pressure, sinus pain and sore throat. Negative for congestion and mouth sores.   Respiratory:  Positive for cough and shortness of breath. Negative for chest tightness and wheezing.   Cardiovascular: Negative.  Negative for chest pain and palpitations.  Gastrointestinal: Negative.  Negative for diarrhea and nausea.  Genitourinary:  Negative for flank pain.  Neurological:  Positive for headaches.  Psychiatric/Behavioral: Negative.      Vital Signs: BP 136/84   Pulse 66   Temp 98.1 F (36.7 C)   Resp 16   Ht 5\' 1"  (1.549 m)   Wt 139 lb 9.6 oz (63.3 kg)   SpO2 98%   BMI 26.38 kg/m    Physical Exam Vitals reviewed.  Constitutional:      General: She is not in acute distress.    Appearance: Normal appearance. She is ill-appearing.  HENT:     Head: Normocephalic and atraumatic.     Right Ear: Tympanic membrane, ear canal and external ear normal.     Left Ear: Tympanic membrane, ear canal  and external ear  normal.     Nose: Congestion and rhinorrhea present.     Mouth/Throat:     Mouth: Mucous membranes are moist.     Pharynx: Posterior oropharyngeal erythema present.  Eyes:     Pupils: Pupils are equal, round, and reactive to light.  Cardiovascular:     Rate and Rhythm: Normal rate and regular rhythm.     Heart sounds: Normal heart sounds. No murmur heard. Pulmonary:     Effort: Pulmonary effort is normal. No respiratory distress.     Breath sounds: Normal breath sounds.  Neurological:     Mental Status: She is alert and oriented to person, place, and time.  Psychiatric:        Mood and Affect: Mood normal.        Behavior: Behavior normal.        Assessment/Plan: 1. Acute non-recurrent frontal sinusitis Zpak ordered, take until gone.  - azithromycin (ZITHROMAX) 250 MG tablet; Take 2 tablets on day 1, then 1 tablet daily on days 2 through 5  Dispense: 6 tablet; Refill: 0  2. Alternating constipation and diarrhea Referred to GI - Ambulatory referral to Gastroenterology  3. Pharyngoesophageal dysphagia Referred to GI and upper GI imaging reordered  - Ambulatory referral to Gastroenterology - DG UGI W DOUBLE CM (HD BA); Future  4. Gastroesophageal reflux disease without esophagitis Referred to GI and upper GI imaging reordered  - Ambulatory referral to Gastroenterology - DG UGI W DOUBLE CM (HD BA); Future  5. Generalized abdominal cramping Referred to GI - Ambulatory referral to Gastroenterology  6. Encounter for medication review Medication list reviewed, updated and refills ordered  - ALPRAZolam (XANAX) 0.25 MG tablet; Take 1 tablet (0.25 mg total) by mouth 2 (two) times daily as needed for anxiety.  Dispense: 60 tablet; Refill: 2 - albuterol (VENTOLIN HFA) 108 (90 Base) MCG/ACT inhaler; Inhale 1-2 puffs into the lungs every 4 (four) hours as needed for wheezing or shortness of breath.  Dispense: 18 each; Refill: 5 - fenofibrate (TRICOR) 145 MG tablet; Take 1 tablet  (145 mg total) by mouth daily.  Dispense: 90 tablet; Refill: 1 - zolpidem (AMBIEN) 5 MG tablet; Take 0.5-1 tablets (2.5-5 mg total) by mouth at bedtime as needed for sleep.  Dispense: 30 tablet; Refill: 0 - HYDROcodone-acetaminophen (NORCO/VICODIN) 5-325 MG tablet; Take 1 tablet by mouth every 6 (six) hours as needed for severe pain or moderate pain.  Dispense: 60 tablet; Refill: 0   General Counseling: Bonnie West verbalizes understanding of the findings of todays visit and agrees with plan of treatment. I have discussed any further diagnostic evaluation that may be needed or ordered today. We also reviewed her medications today. she has been encouraged to call the office with any questions or concerns that should arise related to todays visit.    Orders Placed This Encounter  Procedures   DG UGI W DOUBLE CM (HD BA)   Ambulatory referral to Gastroenterology    Meds ordered this encounter  Medications   ALPRAZolam (XANAX) 0.25 MG tablet    Sig: Take 1 tablet (0.25 mg total) by mouth 2 (two) times daily as needed for anxiety.    Dispense:  60 tablet    Refill:  2    F41.0 dx code   DISCONTD: HYDROcodone-acetaminophen (NORCO/VICODIN) 5-325 MG tablet    Sig: Take 1 tablet by mouth every 6 (six) hours as needed for severe pain or moderate pain.    Dispense:  30 tablet  Refill:  0    Continued script, refill please   albuterol (VENTOLIN HFA) 108 (90 Base) MCG/ACT inhaler    Sig: Inhale 1-2 puffs into the lungs every 4 (four) hours as needed for wheezing or shortness of breath.    Dispense:  18 each    Refill:  5   fenofibrate (TRICOR) 145 MG tablet    Sig: Take 1 tablet (145 mg total) by mouth daily.    Dispense:  90 tablet    Refill:  1   zolpidem (AMBIEN) 5 MG tablet    Sig: Take 0.5-1 tablets (2.5-5 mg total) by mouth at bedtime as needed for sleep.    Dispense:  30 tablet    Refill:  0    Do not fill now, patient will call pharmacy when she needs it.   HYDROcodone-acetaminophen  (NORCO/VICODIN) 5-325 MG tablet    Sig: Take 1 tablet by mouth every 6 (six) hours as needed for severe pain or moderate pain.    Dispense:  60 tablet    Refill:  0    Disregard previous order and fill this one instead. Continued script, refill please, note increased number of tablets   azithromycin (ZITHROMAX) 250 MG tablet    Sig: Take 2 tablets on day 1, then 1 tablet daily on days 2 through 5    Dispense:  6 tablet    Refill:  0    Return for needs pulm f/u with DSK. and has AWV with Peighton Edgin in december already. .   Total time spent:30 Minutes Time spent includes review of chart, medications, test results, and follow up plan with the patient.   [place] Controlled Substance Database was reviewed by me.  This patient was seen by Sallyanne Kuster, FNP-C in collaboration with Dr. Beverely Risen as a part of collaborative care agreement.   Hinton Luellen R. Tedd Sias, MSN, FNP-C Internal medicine

## 2023-07-27 NOTE — Telephone Encounter (Signed)
Awaiting 07/27/23 office notes for GI referral-[name]

## 2023-08-03 ENCOUNTER — Ambulatory Visit
Admission: RE | Admit: 2023-08-03 | Discharge: 2023-08-03 | Disposition: A | Discharge status: Home / Self Care | Payer: Medicare Other | Source: Ambulatory Visit | Attending: Nurse Practitioner | Admitting: Nurse Practitioner

## 2023-08-03 DIAGNOSIS — K219 Gastro-esophageal reflux disease without esophagitis: Secondary | ICD-10-CM | POA: Insufficient documentation

## 2023-08-03 DIAGNOSIS — R1314 Dysphagia, pharyngoesophageal phase: Secondary | ICD-10-CM | POA: Diagnosis not present

## 2023-08-03 DIAGNOSIS — R109 Unspecified abdominal pain: Secondary | ICD-10-CM | POA: Diagnosis not present

## 2023-08-05 ENCOUNTER — Other Ambulatory Visit: Payer: Self-pay | Admitting: Internal Medicine

## 2023-08-05 DIAGNOSIS — Z760 Encounter for issue of repeat prescription: Secondary | ICD-10-CM

## 2023-08-07 ENCOUNTER — Telehealth: Payer: Self-pay

## 2023-08-07 NOTE — Telephone Encounter (Signed)
Patient called stating that her insurance isn't going to cover Flovent any more and that she should try Trelegy, but didn't know if it was a powder base, I spoke with Alyssa and it is a powder base and that she needs to call her insurance to see what they will cover. Patient understood and will call us back within the next day or two.

## 2023-08-07 NOTE — Telephone Encounter (Signed)
Please review

## 2023-08-08 MED ORDER — BUDESONIDE-FORMOTEROL FUMARATE 160-4.5 MCG/ACT IN AERO: 2.0000 | INHALATION_SPRAY | Freq: Two times a day (BID) | RESPIRATORY_TRACT | 12 refills | Status: DC

## 2023-08-09 ENCOUNTER — Telehealth: Payer: Self-pay

## 2023-08-09 MED ORDER — FLUTICASONE PROPIONATE HFA 110 MCG/ACT IN AERO: INHALATION_SPRAY | RESPIRATORY_TRACT | 3 refills | Status: DC

## 2023-08-09 NOTE — Telephone Encounter (Signed)
Pt advised that don't pickup Symbicort due to [facility] called that they approved her Flovent

## 2023-08-28 ENCOUNTER — Other Ambulatory Visit: Payer: Self-pay | Admitting: Nurse Practitioner

## 2023-08-28 DIAGNOSIS — Z760 Encounter for issue of repeat prescription: Secondary | ICD-10-CM

## 2023-08-29 ENCOUNTER — Telehealth: Payer: Self-pay | Admitting: Internal Medicine

## 2023-08-29 NOTE — Telephone Encounter (Signed)
Cpap tittration appointment 09/07/2023 @ [name]

## 2023-09-03 ENCOUNTER — Encounter: Payer: Self-pay | Admitting: Nurse Practitioner

## 2023-09-05 ENCOUNTER — Encounter: Payer: Self-pay | Admitting: Internal Medicine

## 2023-09-05 ENCOUNTER — Other Ambulatory Visit: Payer: Self-pay

## 2023-09-05 ENCOUNTER — Ambulatory Visit: Payer: Medicare Other | Admitting: Internal Medicine

## 2023-09-05 VITALS — BP 120/70 | HR 71 | Temp 97.7°F | Resp 16 | Ht 61.0 in | Wt 136.8 lb

## 2023-09-05 DIAGNOSIS — G4733 Obstructive sleep apnea (adult) (pediatric): Secondary | ICD-10-CM | POA: Diagnosis not present

## 2023-09-05 DIAGNOSIS — J4540 Moderate persistent asthma, uncomplicated: Secondary | ICD-10-CM

## 2023-09-05 DIAGNOSIS — K219 Gastro-esophageal reflux disease without esophagitis: Secondary | ICD-10-CM

## 2023-09-05 DIAGNOSIS — Z79899 Other long term (current) drug therapy: Secondary | ICD-10-CM

## 2023-09-05 NOTE — Progress Notes (Signed)
[facility] [address] [place], [place] [address]  Pulmonary Sleep Medicine   Office Visit Note  Patient Name: Bonnie West DOB: [birth year] MRN [id number]  Date of Service: 09/05/2023  Complaints/HPI: She had a UGI barium done and this shows Moderate GERD. This likely explains her symptoms. She states she is still getting heartburn. She states she is on omeprazole 40mg  but did not take it until lunch time. She also has been scheduled for a sleep study follow up  Office Spirometry Results:     ROS  General: (-) fever, (-) chills, (-) night sweats, (-) weakness Skin: (-) rashes, (-) itching,. Eyes: (-) visual changes, (-) redness, (-) itching. Nose and Sinuses: (-) nasal stuffiness or itchiness, (-) postnasal drip, (-) nosebleeds, (-) sinus trouble. Mouth and Throat: (-) sore throat, (-) hoarseness. Neck: (-) swollen glands, (-) enlarged thyroid, (-) neck pain. Respiratory: + cough, (-) bloody sputum, + shortness of breath, - wheezing. Cardiovascular: - ankle swelling, (-) chest pain. Lymphatic: (-) lymph node enlargement. Neurologic: (-) numbness, (-) tingling. Psychiatric: (-) anxiety, (-) depression   Current Medication: Outpatient Encounter Medications as of 09/05/2023  Medication Sig Note   acetaminophen (TYLENOL) 500 MG tablet Take 1,000 mg by mouth 2 (two) times daily as needed for mild pain or moderate pain.    albuterol (VENTOLIN HFA) 108 (90 Base) MCG/ACT inhaler Inhale 1-2 puffs into the lungs every 4 (four) hours as needed for wheezing or shortness of breath.    ALPRAZolam (XANAX) 0.25 MG tablet Take 1 tablet (0.25 mg total) by mouth 2 (two) times daily as needed for anxiety.    bisacodyl (DULCOLAX) 5 MG EC tablet Take 5-10 mg by mouth at bedtime as needed for moderate constipation. Two at night    cetirizine (ZYRTEC) 10 MG tablet Take 10 mg by mouth daily.    cholecalciferol (VITAMIN D) 1000 units tablet Take 1,000 Units by mouth at bedtime.     clindamycin (CLEOCIN T) 1 % external solution Apply 1 Application topically 2 (two) times daily as needed. For skin breakdown    cyclobenzaprine (FLEXERIL) 5 MG tablet Take 1-2 tablets (5-10 mg total) by mouth at bedtime. 04/18/2023: Takes as needed   docusate sodium (COLACE) 100 MG capsule Take 100-200 mg by mouth at bedtime as needed for mild constipation or moderate constipation.     estradiol (ESTRACE) 0.1 MG/GM vaginal cream INSERT 1 GRAM VAGINALLY TWICE WEEKLY AT BEDTIME    ezetimibe (ZETIA) 10 MG tablet Take 1 tablet by mouth daily. 04/18/2023: New med she hasn't started yet due to current illness   fenofibrate (TRICOR) 145 MG tablet Take 1 tablet (145 mg total) by mouth daily.    FLUoxetine (PROZAC) 40 MG capsule TAKE 1 CAPSULE (40 MG TOTAL) BY MOUTH DAILY.    fluticasone (FLOVENT HFA) 110 MCG/ACT inhaler USE 2 PUFFS INHALED TWICE DAILY    HYDROcodone-acetaminophen (NORCO/VICODIN) 5-325 MG tablet Take 1 tablet by mouth every 6 (six) hours as needed for severe pain or moderate pain.    ipratropium (ATROVENT HFA) 17 MCG/ACT inhaler INHALE 2 PUFFS 4 TIMES DAILY    isometheptene-acetaminophen-dichloralphenazone (MIDRIN) 65-100-325 MG capsule Take 1 capsule by mouth 4 (four) times daily as needed for migraine. Maximum 5 capsules in 12 hours for migraine headaches, 8 capsules in 24 hours for tension headaches.    levothyroxine (SYNTHROID) 100 MCG tablet TAKE 1 TABLET BY MOUTH EVERY DAY IN THE MORNING    lisinopril (ZESTRIL) 10 MG tablet Take 1 tablet (10 mg  total) by mouth daily.    magnesium oxide (MAG-OX) 400 MG tablet Take 400 mg by mouth at bedtime.     montelukast (SINGULAIR) 10 MG tablet TAKE 1 TABLET BY MOUTH EVERY DAY    omeprazole (PRILOSEC) 40 MG capsule TAKE 1 CAPSULE BY MOUTH EVERY DAY    polyethylene glycol powder (GLYCOLAX/MIRALAX) 17 GM/SCOOP powder Take 17 g by mouth every evening.    predniSONE (DELTASONE) 20 MG tablet 60 mg BID x 3 days, 40 mg BID x 3 days, 20mg  BID x 3 days, then  stop    triamcinolone cream (KENALOG) 0.1 % Apply 1 application topically 2 (two) times daily as needed. For skin breakdown    vitamin B-12 (CYANOCOBALAMIN) 1000 MCG tablet Take 1,000 mcg by mouth daily.    zolpidem (AMBIEN) 5 MG tablet Take 0.5-1 tablets (2.5-5 mg total) by mouth at bedtime as needed for sleep.    ipratropium-albuterol (DUONEB) 0.5-2.5 (3) MG/3ML SOLN Take 3 mLs by nebulization every 4 (four) hours as needed (SOB, wheezing).    No facility-administered encounter medications on file as of 09/05/2023.    Surgical History: Past Surgical History:  Procedure Laterality Date   BUNIONECTOMY     CATARACT EXTRACTION W/PHACO Right 10/11/2016   Procedure: CATARACT EXTRACTION PHACO AND INTRAOCULAR LENS PLACEMENT (IOC);  Surgeon: Galen Manila, MD;  Location: [facility] ORS;  Service: Ophthalmology;  Laterality: Right;  Lot# [id number] H [place]: 00:37.7 AP%: 18.1 CDE: 6.80   CATARACT EXTRACTION W/PHACO Left 11/08/2016   Procedure: CATARACT EXTRACTION PHACO AND INTRAOCULAR LENS PLACEMENT (IOC);  Surgeon: Galen Manila, MD;  Location: [facility] ORS;  Service: Ophthalmology;  Laterality: Left;  PACK LOT: [id number] H US:00:32 AP:44 CDE:6.46   COLONOSCOPY     COLONOSCOPY WITH PROPOFOL N/A 11/26/2015   Procedure: COLONOSCOPY WITH PROPOFOL;  Surgeon: Scot Jun, MD;  Location: [facility] ENDOSCOPY;  Service: Endoscopy;  Laterality: N/A;   EYE SURGERY  07/23/2021   EYE SURGERY Left 09/24/2021   strabusmis, double vision   TONSILLECTOMY      Medical History: Past Medical History:  Diagnosis Date   Allergic rhinitis    Anxiety    Asthma    Blood transfusion without reported diagnosis    Depression    Diverticulosis    Dyspnea    Esophagitis    GERD (gastroesophageal reflux disease)    Headache    Heart murmur    Hyperlipemia    Hypertension    Hypothyroidism    Obesity    Osteopenia    Palpitations    Pneumothorax    Sleep apnea     Family History: Family History  Problem  Relation Age of Onset   Breast cancer Maternal Grandmother    Stroke Maternal Grandmother    Bladder Cancer Father    Prostate cancer Father    Heart attack Father    Aortic aneurysm Father    Congestive Heart Failure Father    Colon cancer Paternal Grandmother    Aneurysm Paternal Grandmother    Stroke Paternal Grandmother    Congestive Heart Failure Paternal Grandmother    Dementia Mother    Stroke Mother    Dementia Maternal Grandfather    Stroke Maternal Grandfather    Dementia Paternal Grandfather    Stroke Paternal Grandfather     Social History: Social History   Socioeconomic History   Marital status: Married    Spouse name: Not on file   Number of children: Not on file   Years of education: Not on file  Highest education level: Not on file  Occupational History   Not on file  Tobacco Use   Smoking status: Former   Smokeless tobacco: Never  Vaping Use   Vaping status: Never Used  Substance and Sexual Activity   Alcohol use: No   Drug use: No   Sexual activity: Not Currently    Partners: Male    Birth control/protection: Post-menopausal  Other Topics Concern   Not on file  Social History Narrative   Not on file   Social Determinants of Health   Financial Resource Strain: Low Risk  (03/23/2021)   Overall Financial Resource Strain (CARDIA)    Difficulty of Paying Living Expenses: Not very hard  Food Insecurity: No Food Insecurity (04/19/2023)   Hunger Vital Sign    Worried About Running Out of Food in the Last Year: Never true    Ran Out of Food in the Last Year: Never true  Transportation Needs: No Transportation Needs (04/19/2023)   PRAPARE - Administrator, Civil Service (Medical): No    Lack of Transportation (Non-Medical): No  Physical Activity: Not on file  Stress: Not on file  Social Connections: Not on file  Intimate Partner Violence: Not At Risk (04/19/2023)   Humiliation, Afraid, Rape, and Kick questionnaire    Fear of Current or  Ex-Partner: No    Emotionally Abused: No    Physically Abused: No    Sexually Abused: No    Vital Signs: Blood pressure 120/70, pulse 71, temperature 97.7 F (36.5 C), resp. rate 16, height 5\' 1"  (1.549 m), weight 136 lb 12.8 oz (62.1 kg), SpO2 98%.  Examination: General Appearance: The patient is well-developed, well-nourished, and in no distress. Skin: Gross inspection of skin unremarkable. Head: normocephalic, no gross deformities. Eyes: no gross deformities noted. ENT: ears appear grossly normal no exudates. Neck: Supple. No thyromegaly. No LAD. Respiratory: no rhonchi noted. Cardiovascular: Normal S1 and S2 without murmur or rub. Extremities: No cyanosis. pulses are equal. Neurologic: Alert and oriented. No involuntary movements.  LABS: No results found for this or any previous visit (from the past 2160 hour(s)).  Radiology: DG UGI W DOUBLE CM (HD BA)  Result Date: 08/03/2023 INDICATION: Patient with complaints of left-sided abdominal pain and concern for intermittent aspiration with saliva. Patient denies any pertinent previous GI surgeries. EXAM: UPPER GI SERIES WITH HIGH DENSITY WITHOUT KUB TECHNIQUE: Combined double and single contrast examination was performed using effervescent crystals, high-density barium and thin liquid barium. The patient was observed with fluoroscopy swallowing a 13 mm barium sulphate tablet. FLUOROSCOPY TIME:  Radiation Exposure Index (as provided by the fluoroscopic device): 2.3 minute, 16.00 mGy COMPARISON:  NONE. PROCEDURE: UPPER GI SERIES: Normal pharyngeal anatomy and motility. No laryngeal penetration or tracheal aspiration seen. Contrast flowed freely through the esophagus without evidence of a stricture, diverticula or mass. Normal esophageal mucosa without evidence of irregularity or ulceration. No esophageal dysmotility is seen. No evidence of hiatal hernia. Spontaneous gastroesophageal reflux extending to mid esophagus. Examination of the  stomach demonstrated normal rugal folds and areae gastricae. Gastric mucosa appeared unremarkable without evidence of ulceration, scarring, or mass lesion. Gastric motility and emptying was normal. Fluoroscopic examination of the duodenum demonstrates normal motility and morphology without evidence of ulceration or mass lesion. At the end of the examination a 13 mm barium tablet was administered which transited through the esophagus and esophagogastric junction without delay. COMPLICATIONS: NONE. IMPRESSION: 1. Moderate volume spontaneous gastroesophageal reflux extending to mid esophagus. Otherwise normal upper  GI. 2.  No aspiration seen on today's examination. This exam was performed by Pattricia Boss PA-C, and was supervised and interpreted by Dr. Jayme Cloud. Electronically Signed   By: Jearld Lesch M.D.   On: 08/03/2023 10:12    No results found.  No results found.  Assessment and Plan: Patient Active Problem List   Diagnosis Date Noted   Asthma exacerbation 04/18/2023   Vaginal atrophy 08/20/2020   Diastolic dysfunction 07/15/2020   OSA on CPAP 07/15/2020   Tightness in chest 05/20/2020   Hypercalcemia 05/20/2020   Primary insomnia 05/20/2020   Acute non-recurrent pansinusitis 02/12/2020   Encounter for general adult medical examination with abnormal findings 11/10/2019   Gastroesophageal reflux disease without esophagitis 11/10/2019   Dysuria 11/10/2019   Acute otitis externa of left ear 07/15/2019   Irritable bowel syndrome with constipation 07/15/2019   Chronic midline low back pain without sciatica 04/27/2019   Allergic rhinitis due to pollen 04/27/2019   Essential hypertension, benign 04/27/2019   GAD (generalized anxiety disorder) 09/01/2018   Chronic migraine without aura without status migrainosus, not intractable 09/01/2018   Encounter for long-term (current) use of medications 09/01/2018   Asthma without status asthmaticus 01/30/2018   Hyperlipidemia, unspecified 01/30/2018    Hypertension 01/30/2018   Obesity, unspecified 01/30/2018   Hypothyroidism 01/30/2018   Fatty infiltration of liver 12/10/2015   Abdominal pain, LLQ (left lower quadrant) 11/03/2015   History of adenomatous polyp of colon 11/03/2015   Hallux valgus, left 09/18/2014   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   Shoulder arthritis 12/19/2013    1. Gastroesophageal reflux disease without esophagitis Patient has significant GERD plan is to continue with aspiration precautions continue with GERD precautions.  These have been discussed with the patient in the past  2. OSA (obstructive sleep apnea) Scheduled for CPAP titration. She will benefit from it with her GERD also  3. Moderate persistent asthma without status asthmaticus without complication Overall under decent control the plan is going to continue with current regimen  General Counseling: I have discussed the findings of the evaluation and examination with Bonita Quin.  I have also discussed any further diagnostic evaluation thatmay be needed or ordered today. Chinelo verbalizes understanding of the findings of todays visit. We also reviewed her medications today and discussed drug interactions and side effects including but not limited excessive drowsiness and altered mental states. We also discussed that there is always a risk not just to her but also people around her. she has been encouraged to call the office with any questions or concerns that should arise related to todays visit.  No orders of the defined types were placed in this encounter.    Time spent: 71  I have personally obtained a history, examined the patient, evaluated laboratory and imaging results, formulated the assessment and plan and placed orders.    Yevonne Pax, MD [facility] Pulmonary and Critical Care Sleep medicine

## 2023-09-05 NOTE — Patient Instructions (Signed)

## 2023-09-06 ENCOUNTER — Other Ambulatory Visit: Payer: Self-pay | Admitting: Nurse Practitioner

## 2023-09-06 ENCOUNTER — Telehealth: Payer: Self-pay | Admitting: Nurse Practitioner

## 2023-09-06 DIAGNOSIS — J45901 Unspecified asthma with (acute) exacerbation: Secondary | ICD-10-CM

## 2023-09-06 DIAGNOSIS — J4540 Moderate persistent asthma, uncomplicated: Secondary | ICD-10-CM

## 2023-09-06 NOTE — Telephone Encounter (Signed)
GI referral sent via [facility] to [facility]. Notified patient. Gave pt telephone # [phone] -Sheralyn Boatman

## 2023-09-06 NOTE — Telephone Encounter (Signed)
GI appointment 12/19/2023 @ [name]

## 2023-09-07 ENCOUNTER — Encounter (INDEPENDENT_AMBULATORY_CARE_PROVIDER_SITE_OTHER): Payer: Medicare Other | Admitting: Internal Medicine

## 2023-09-07 DIAGNOSIS — G4733 Obstructive sleep apnea (adult) (pediatric): Secondary | ICD-10-CM | POA: Diagnosis not present

## 2023-09-19 NOTE — Procedures (Signed)
[facility]  Polysomnogram Report Part I  Phone: [phone] Fax: [phone]  Patient Name: Bonnie West, Bonnie West. Acquisition Number: [id number]  Date of Birth: 1951/08/30 Acquisition Date: 09/07/2023  Referring Physician: Yevonne Pax MD     History: The patient is a 72 year old  . Medical History: allergic rhinitis, anxiety, asthma, depression, diverticulosis, dyspnea, esophagitis, GERD, headache, heart murmur, hyperlipemia, hypertension, hypothyroidism, obesity.  Medications: Tylenol, Ventolin HFA, Xanax, Lioresal, Dulcolax, Zyrtec, vitamin D, Cleocin T, Flexeri, Colace, Estrace, Tricor, Prozac, Flovent HFA, Norco/Vicodin, Atrovent HFA, Duoneb, Midrin, Synthroid, Zestril, Mag-Ox, Singulair, Prilosec, Glycolax/Miralax, promethazine-DM, Kenalog, cyanocobalamin, Ambien.  Procedure: This routine overnight polysomnogram was performed on the [facility] 5 using the standard CPAP protocol. This included 6 channels of EEG, 2 channels of EOG, chin EMG, bilateral anterior tibialis EMG, nasal/oral thermistor, PTAF (nasal pressure transducer), chest and abdominal wall movements, EKG, and pulse oximetry.  Description: The total recording time was 463.5 minutes. The total sleep time was 292.0 minutes. There were a total of 171.5 minutes of wakefulness after sleep onset for areducedsleep efficiency of 63.0%. The latency to sleep onset was shortat 0.0 minutes. The R sleep onset latency wasprolonged at 347.0 minutes. Sleep parameters, as a percentage of the total sleep time, demonstrated 7.0% of sleep was in N1 sleep, 63.7% N2, 6.0% N3 and 23.3% R sleep. There were a total of 26 arousals for an arousal index of 5.3 arousals per hour of sleep that was normal.  Overall, there were a total of 6 respiratory events for a respiratory disturbance index, which includes apneas, hypopneas and RERAs (increased respiratory effort) of 1.2 respiratory events per hour of sleep during the pressure titration.  was initiated  at 4 cm H2O at lights out, 10:07 p.m. It was titrated to the final pressure of 5 cm H2O for occasional respiratory events. The apnea was controlled at the final pressure and supine, REM sleep was observed.   Additionally, the baseline oxygen saturation during wakefulness was 95%, during NREM sleep averaged 95%, and during REM sleep averaged 96%. The total duration of oxygen < 90% was 0.5 minutes.  Cardiac monitoring-   significant cardiac rhythm irregularities.   Periodic limb movement monitoring- demonstrated that there were 330 periodic limb movements for a periodic limb movement index of 67.8 periodic limb movements per hour of sleep. Quasi-periodic limb movements were observed during periods of wakefulness.  Impression: This patient's obstructive sleep apnea demonstrated significant improvement with the utilization of nasal  at 5 cm H2O.   There was a highly elevated periodic limb movement index of 67.8 periodic limb movements per hour of sleep. In addition, quasi-periodic limb movements were observed during periods of wakefulness. These limb movements were observed during the prior PSG. Treatment may be indicated if sleep disruption or sleepiness persist once the patient is fully compliant with CPAP.  Recommendations: Would recommend utilization of nasal  at 5 cm H2O.     An AirFit F40 mask, size Medium , was used. Chin strap used during study- No . Humidifier used during study- Yes .     Yevonne Pax, MD, [facility] Diplomate ABMS-Pulmonary, Critical Care and Sleep Medicine  Electronically reviewed and digitally signed   [facility] CPAP/BIPAP Polysomnogram Report Part II Phone: [phone] Fax: [phone]  Patient last name Bonnie West Neck Size  14.0  in. Acquisition [id number]  Patient first name Bonnie West. Weight 142.0 lbs. Started 09/07/2023 at 10:01:44 PM  Birth date 1951-06-17 Height 61.0 in. Stopped 09/08/2023 at  5:54:20 AM  Age 55      Type Adult BMI 26.8 lb/in2  Duration 463.5  Robbi Garter. RPSGT.  / Loura Back  Reviewed by: Valentino Hue. Henke, PhD, ABSM, FAASM Sleep Data: Lights Out: 10:07:14 PM Sleep Onset: 10:07:14 PM  Lights On: 5:50:44 AM Sleep Efficiency: 63.0 %  Total Recording Time: 463.5 min Sleep Latency (from Lights Off) 0.0 min  Total Sleep Time (TST): 292.0 min R Latency (from Sleep Onset): 347.0 min  Sleep Period Time: 463.5 min Total number of awakenings: 19  Wake during sleep: 171.5 min Wake After Sleep Onset (WASO): 171.5 min   Sleep Data:         Arousal Summary: Stage  Latency from lights out (min) Latency from sleep onset (min) Duration (min) % Total Sleep Time  Normal values  N 1 0.5 0.5 20.5 7.0 (5%)  N 2 0.0 0.0 186.0 63.7 (50%)  N 3 178.0 178.0 17.5 6.0 (20%)  R 347.0 347.0 68.0 23.3 (25%)   Number Index  Spontaneous 12 2.5  Apneas & Hypopneas 2 0.4  RERAs 0 0.0       (Apneas & Hypopneas & RERAs)  (2) (0.4)  Limb Movement 12 2.5  Snore 0 0.0  TOTAL 26 5.3     Respiratory Data:  CA OA MA Apnea Hypopnea* A+ H RERA Total  Number 0 3 0 3 3 6  0 6  Mean Dur (sec) 0.0 14.5 0.0 14.5 17.5 16.0 0.0 16.0  Max Dur (sec) 0.0 18.0 0.0 18.0 20.5 20.5 0.0 20.5  Total Dur (min) 0.0 0.7 0.0 0.7 0.9 1.6 0.0 1.6  % of TST 0.0 0.2 0.0 0.2 0.3 0.5 0.0 0.5  Index (#/h TST) 0.0 0.6 0.0 0.6 0.6 1.2 0.0 1.2  *Hypopneas scored based on 4% or greater desaturation.  Sleep Stage:         REM NREM TST  AHI 0.9 1.3 1.2  RDI 0.9 1.3 1.2   Sleep (min) TST (%) REM (min) NREM (min) CA (#) OA (#) MA (#) HYP (#) AHI (#/h) RERA (#) RDI (#/h) Desat (#)  Supine 61.6 21.10 0.1 61.5 0 2 0 3 4.9 0 4.9 5  Non-Supine 230.40 78.90 67.90 162.50 0.00 1.00 0.00 0.00 0.26 0 0.26 1.00  Left: 45.0 15.41 0.0 45.0 0 0 0 0 0.0 0 0.00 0  Right: 185.4 63.49 67.9 117.5 0 1 0 0 0.3 0 0.3 1  UP: 0.0 0.00 0.0 0.0 0 0 0 0 0.0 0 0.00 0     Snoring: Total number of snoring episodes  0  Total time with snoring    min (   % of sleep)   Oximetry  Distribution:             WK REM NREM TOTAL  Average (%)   95 96 95 95  < 90% 0.2 0.0 0.3 0.5  < 80% 0.2 0.0 0.0 0.2  < 70% 0.2 0.0 0.0 0.2  # of Desaturations* 0 1 5 6   Desat Index (#/hour) 0.0 0.9 1.3 1.2  Desat Max (%) 0 4 9 9   Desat Max Dur (sec) 0.0 20.0 97.0 97.0  Approx Min O2 during sleep 88  Approx min O2 during a respiratory event 88  Was Oxygen added (Y/N) and final rate :    LPM  *Desaturations based on 4% or greater drop from baseline.   Cheyne Stokes Breathing: None Present   Heart Rate Summary:  Average Heart Rate During Sleep 60.3  bpm      Highest Heart Rate During Sleep (95th %) 65.0 bpm      Highest Heart Rate During Sleep 218 bpm (artifact)  Highest Heart Rate During Recording (TIB) 234 bpm (artifact)   Heart Rate Observations: Event Type # Events   Bradycardia 0 Lowest HR Scored: N/A  Sinus Tachycardia During Sleep 0 Highest HR Scored: N/A  Narrow Complex Tachycardia 0 Highest HR Scored: N/A  Wide Complex Tachycardia 0 Highest HR Scored: N/A  Asystole 0 Longest Pause: N/A  Atrial Fibrillation 0 Duration Longest Event: N/A  Other Arrythmias   Type:   Periodic Limb Movement Data: (Primary legs unless otherwise noted) Total # Limb Movement 330 Limb Movement Index 67.8  Total # PLMS 330 PLMS Index 67.8  Total # PLMS Arousals 12 PLMS Arousal Index 2.5  Percentage Sleep Time with PLMS 140.37min (48.2 % sleep)  Mean Duration limb movements (secs) 1688.7    IPAP Level (cmH2O) EPAP Level (cmH2O) Total Duration (min) Sleep Duration (min) Sleep (%) REM (%) CA  #) OA # MA # HYP #) AHI (#/hr) RERAs # RERAs (#/hr) RDI (#/hr)  0 0 5.5 0.0 0.0 0.0 0 0 0 0 0.0 0 0.0 0.0  4 4 157.1 61.4 39.1 0.0 0 0 0 2 2.0 0 0.0 2.0  5 0 3.0 0.0 0.0 0.0 0 0 0 0 0.0 0 0.0 0.0  5 5 270.5 230.3 85.1 25.1 0 3 0 1 1.0 0 0.0 1.0

## 2023-10-03 ENCOUNTER — Ambulatory Visit (INDEPENDENT_AMBULATORY_CARE_PROVIDER_SITE_OTHER): Payer: Medicare Other | Admitting: Internal Medicine

## 2023-10-03 ENCOUNTER — Telehealth: Payer: Self-pay | Admitting: Internal Medicine

## 2023-10-03 VITALS — BP 133/78 | HR 71 | Temp 97.9°F | Resp 16 | Ht 61.0 in | Wt 137.6 lb

## 2023-10-03 DIAGNOSIS — K219 Gastro-esophageal reflux disease without esophagitis: Secondary | ICD-10-CM | POA: Diagnosis not present

## 2023-10-03 DIAGNOSIS — J4540 Moderate persistent asthma, uncomplicated: Secondary | ICD-10-CM

## 2023-10-03 DIAGNOSIS — G4733 Obstructive sleep apnea (adult) (pediatric): Secondary | ICD-10-CM | POA: Diagnosis not present

## 2023-10-03 NOTE — Patient Instructions (Signed)

## 2023-10-03 NOTE — Progress Notes (Signed)
[facility] [address] [place], [place] [address]  Pulmonary Sleep Medicine   Office Visit Note  Patient Name: Bonnie West DOB: 1951/03/20 MRN [id number]  Date of Service: 10/03/2023  Complaints/HPI: She had a CPAP titration done and this shows she did well with the CPAP of 5cwp. Patient states she feels her current machine is not working well. Patient did OK with the machine at night during the procedure. Denies cough congestion and states no admissions  Office Spirometry Results:     ROS  General: (-) fever, (-) chills, (-) night sweats, (-) weakness Skin: (-) rashes, (-) itching,. Eyes: (-) visual changes, (-) redness, (-) itching. Nose and Sinuses: (-) nasal stuffiness or itchiness, (-) postnasal drip, (-) nosebleeds, (-) sinus trouble. Mouth and Throat: (-) sore throat, (-) hoarseness. Neck: (-) swollen glands, (-) enlarged thyroid, (-) neck pain. Respiratory: - cough, (-) bloody sputum, - shortness of breath, - wheezing. Cardiovascular: - ankle swelling, (-) chest pain. Lymphatic: (-) lymph node enlargement. Neurologic: (-) numbness, (-) tingling. Psychiatric: (-) anxiety, (-) depression   Current Medication: Outpatient Encounter Medications as of 10/03/2023  Medication Sig Note   acetaminophen (TYLENOL) 500 MG tablet Take 1,000 mg by mouth 2 (two) times daily as needed for mild pain or moderate pain.    albuterol (VENTOLIN HFA) 108 (90 Base) MCG/ACT inhaler Inhale 1-2 puffs into the lungs every 4 (four) hours as needed for wheezing or shortness of breath.    ALPRAZolam (XANAX) 0.25 MG tablet Take 1 tablet (0.25 mg total) by mouth 2 (two) times daily as needed for anxiety.    bisacodyl (DULCOLAX) 5 MG EC tablet Take 5-10 mg by mouth at bedtime as needed for moderate constipation. Two at night    cetirizine (ZYRTEC) 10 MG tablet Take 10 mg by mouth daily.    cholecalciferol (VITAMIN D) 1000 units tablet Take 1,000 Units by mouth at bedtime.     clindamycin (CLEOCIN T) 1 % external solution Apply 1 Application topically 2 (two) times daily as needed. For skin breakdown    cyclobenzaprine (FLEXERIL) 5 MG tablet Take 1-2 tablets (5-10 mg total) by mouth at bedtime. 04/18/2023: Takes as needed   docusate sodium (COLACE) 100 MG capsule Take 100-200 mg by mouth at bedtime as needed for mild constipation or moderate constipation.     estradiol (ESTRACE) 0.1 MG/GM vaginal cream INSERT 1 GRAM VAGINALLY TWICE WEEKLY AT BEDTIME    ezetimibe (ZETIA) 10 MG tablet Take 1 tablet by mouth daily. 04/18/2023: New med she hasn't started yet due to current illness   fenofibrate (TRICOR) 145 MG tablet Take 1 tablet (145 mg total) by mouth daily.    FLUoxetine (PROZAC) 40 MG capsule TAKE 1 CAPSULE (40 MG TOTAL) BY MOUTH DAILY.    fluticasone (FLOVENT HFA) 110 MCG/ACT inhaler USE 2 PUFFS INHALED TWICE DAILY    HYDROcodone-acetaminophen (NORCO/VICODIN) 5-325 MG tablet Take 1 tablet by mouth every 6 (six) hours as needed for severe pain or moderate pain.    ipratropium (ATROVENT HFA) 17 MCG/ACT inhaler INHALE 2 PUFFS 4 TIMES DAILY    ipratropium-albuterol (DUONEB) 0.5-2.5 (3) MG/3ML SOLN TAKE 3 MLS BY NEBULIZATION EVERY 4 (FOUR) HOURS AS NEEDED (SOB, WHEEZING).    isometheptene-acetaminophen-dichloralphenazone (MIDRIN) 65-100-325 MG capsule Take 1 capsule by mouth 4 (four) times daily as needed for migraine. Maximum 5 capsules in 12 hours for migraine headaches, 8 capsules in 24 hours for tension headaches.    levothyroxine (SYNTHROID) 100 MCG tablet TAKE 1 TABLET BY  MOUTH EVERY DAY IN THE MORNING    lisinopril (ZESTRIL) 10 MG tablet Take 1 tablet (10 mg total) by mouth daily.    magnesium oxide (MAG-OX) 400 MG tablet Take 400 mg by mouth at bedtime.     montelukast (SINGULAIR) 10 MG tablet TAKE 1 TABLET BY MOUTH EVERY DAY    omeprazole (PRILOSEC) 40 MG capsule TAKE 1 CAPSULE BY MOUTH EVERY DAY    polyethylene glycol powder (GLYCOLAX/MIRALAX) 17 GM/SCOOP powder Take  17 g by mouth every evening.    predniSONE (DELTASONE) 20 MG tablet 60 mg BID x 3 days, 40 mg BID x 3 days, 20mg  BID x 3 days, then stop    triamcinolone cream (KENALOG) 0.1 % Apply 1 application topically 2 (two) times daily as needed. For skin breakdown    vitamin B-12 (CYANOCOBALAMIN) 1000 MCG tablet Take 1,000 mcg by mouth daily.    zolpidem (AMBIEN) 5 MG tablet Take 0.5-1 tablets (2.5-5 mg total) by mouth at bedtime as needed for sleep.    No facility-administered encounter medications on file as of 10/03/2023.    Surgical History: Past Surgical History:  Procedure Laterality Date   BUNIONECTOMY     CATARACT EXTRACTION W/PHACO Right 10/11/2016   Procedure: CATARACT EXTRACTION PHACO AND INTRAOCULAR LENS PLACEMENT (IOC);  Surgeon: Galen Manila, MD;  Location: [facility] ORS;  Service: Ophthalmology;  Laterality: Right;  Lot# [id number] H [place]: 00:37.7 AP%: 18.1 CDE: 6.80   CATARACT EXTRACTION W/PHACO Left 11/08/2016   Procedure: CATARACT EXTRACTION PHACO AND INTRAOCULAR LENS PLACEMENT (IOC);  Surgeon: Galen Manila, MD;  Location: [facility] ORS;  Service: Ophthalmology;  Laterality: Left;  PACK LOT: [id number] H US:00:32 AP:44 CDE:6.46   COLONOSCOPY     COLONOSCOPY WITH PROPOFOL N/A 11/26/2015   Procedure: COLONOSCOPY WITH PROPOFOL;  Surgeon: Scot Jun, MD;  Location: [facility] ENDOSCOPY;  Service: Endoscopy;  Laterality: N/A;   EYE SURGERY  07/23/2021   EYE SURGERY Left 09/24/2021   strabusmis, double vision   TONSILLECTOMY      Medical History: Past Medical History:  Diagnosis Date   Allergic rhinitis    Anxiety    Asthma    Blood transfusion without reported diagnosis    Depression    Diverticulosis    Dyspnea    Esophagitis    GERD (gastroesophageal reflux disease)    Headache    Heart murmur    Hyperlipemia    Hypertension    Hypothyroidism    Obesity    Osteopenia    Palpitations    Pneumothorax    Sleep apnea     Family History: Family History  Problem Relation  Age of Onset   Breast cancer Maternal Grandmother    Stroke Maternal Grandmother    Bladder Cancer Father    Prostate cancer Father    Heart attack Father    Aortic aneurysm Father    Congestive Heart Failure Father    Colon cancer Paternal Grandmother    Aneurysm Paternal Grandmother    Stroke Paternal Grandmother    Congestive Heart Failure Paternal Grandmother    Dementia Mother    Stroke Mother    Dementia Maternal Grandfather    Stroke Maternal Grandfather    Dementia Paternal Grandfather    Stroke Paternal Grandfather     Social History: Social History   Socioeconomic History   Marital status: Married    Spouse name: Not on file   Number of children: Not on file   Years of education: Not on file   Highest education  level: Not on file  Occupational History   Not on file  Tobacco Use   Smoking status: Former   Smokeless tobacco: Never  Vaping Use   Vaping status: Never Used  Substance and Sexual Activity   Alcohol use: No   Drug use: No   Sexual activity: Not Currently    Partners: Male    Birth control/protection: Post-menopausal  Other Topics Concern   Not on file  Social History Narrative   Not on file   Social Determinants of Health   Financial Resource Strain: Low Risk  (03/23/2021)   Overall Financial Resource Strain (CARDIA)    Difficulty of Paying Living Expenses: Not very hard  Food Insecurity: No Food Insecurity (04/19/2023)   Hunger Vital Sign    Worried About Running Out of Food in the Last Year: Never true    Ran Out of Food in the Last Year: Never true  Transportation Needs: No Transportation Needs (04/19/2023)   PRAPARE - Administrator, Civil Service (Medical): No    Lack of Transportation (Non-Medical): No  Physical Activity: Not on file  Stress: Not on file  Social Connections: Not on file  Intimate Partner Violence: Not At Risk (04/19/2023)   Humiliation, Afraid, Rape, and Kick questionnaire    Fear of Current or  Ex-Partner: No    Emotionally Abused: No    Physically Abused: No    Sexually Abused: No    Vital Signs: Blood pressure 133/78, pulse 71, temperature 97.9 F (36.6 C), resp. rate 16, height 5\' 1"  (1.549 m), weight 137 lb 9.6 oz (62.4 kg), SpO2 96%.  Examination: General Appearance: The patient is well-developed, well-nourished, and in no distress. Skin: Gross inspection of skin unremarkable. Head: normocephalic, no gross deformities. Eyes: no gross deformities noted. ENT: ears appear grossly normal no exudates. Neck: Supple. No thyromegaly. No LAD. Respiratory: no rhonchi noted. Cardiovascular: Normal S1 and S2 without murmur or rub. Extremities: No cyanosis. pulses are equal. Neurologic: Alert and oriented. No involuntary movements.  LABS: No results found for this or any previous visit (from the past 2160 hour(s)).  Radiology: DG UGI W DOUBLE CM (HD BA)  Result Date: 08/03/2023 INDICATION: Patient with complaints of left-sided abdominal pain and concern for intermittent aspiration with saliva. Patient denies any pertinent previous GI surgeries. EXAM: UPPER GI SERIES WITH HIGH DENSITY WITHOUT KUB TECHNIQUE: Combined double and single contrast examination was performed using effervescent crystals, high-density barium and thin liquid barium. The patient was observed with fluoroscopy swallowing a 13 mm barium sulphate tablet. FLUOROSCOPY TIME:  Radiation Exposure Index (as provided by the fluoroscopic device): 2.3 minute, 16.00 mGy COMPARISON:  NONE. PROCEDURE: UPPER GI SERIES: Normal pharyngeal anatomy and motility. No laryngeal penetration or tracheal aspiration seen. Contrast flowed freely through the esophagus without evidence of a stricture, diverticula or mass. Normal esophageal mucosa without evidence of irregularity or ulceration. No esophageal dysmotility is seen. No evidence of hiatal hernia. Spontaneous gastroesophageal reflux extending to mid esophagus. Examination of the  stomach demonstrated normal rugal folds and areae gastricae. Gastric mucosa appeared unremarkable without evidence of ulceration, scarring, or mass lesion. Gastric motility and emptying was normal. Fluoroscopic examination of the duodenum demonstrates normal motility and morphology without evidence of ulceration or mass lesion. At the end of the examination a 13 mm barium tablet was administered which transited through the esophagus and esophagogastric junction without delay. COMPLICATIONS: NONE. IMPRESSION: 1. Moderate volume spontaneous gastroesophageal reflux extending to mid esophagus. Otherwise normal upper GI. 2.  No aspiration seen on today's examination. This exam was performed by Pattricia Boss PA-C, and was supervised and interpreted by Dr. Jayme Cloud. Electronically Signed   By: Jearld Lesch M.D.   On: 08/03/2023 10:12    No results found.  No results found.  Assessment and Plan: Patient Active Problem List   Diagnosis Date Noted   Asthma exacerbation 04/18/2023   Vaginal atrophy 08/20/2020   Diastolic dysfunction 07/15/2020   OSA on CPAP 07/15/2020   Tightness in chest 05/20/2020   Hypercalcemia 05/20/2020   Primary insomnia 05/20/2020   Acute non-recurrent pansinusitis 02/12/2020   Encounter for general adult medical examination with abnormal findings 11/10/2019   Gastroesophageal reflux disease without esophagitis 11/10/2019   Dysuria 11/10/2019   Acute otitis externa of left ear 07/15/2019   Irritable bowel syndrome with constipation 07/15/2019   Chronic midline low back pain without sciatica 04/27/2019   Allergic rhinitis due to pollen 04/27/2019   Essential hypertension, benign 04/27/2019   GAD (generalized anxiety disorder) 09/01/2018   Chronic migraine without aura without status migrainosus, not intractable 09/01/2018   Encounter for long-term (current) use of medications 09/01/2018   Asthma without status asthmaticus 01/30/2018   Hyperlipidemia, unspecified 01/30/2018    Hypertension 01/30/2018   Obesity, unspecified 01/30/2018   Hypothyroidism 01/30/2018   Fatty infiltration of liver 12/10/2015   Abdominal pain, LLQ (left lower quadrant) 11/03/2015   History of adenomatous polyp of colon 11/03/2015   Hallux valgus, left 09/18/2014   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   Shoulder arthritis 12/19/2013    1. OSA (obstructive sleep apnea) Will get her setup with a new machine since her old is faulty - For home use only DME continuous positive airway pressure (CPAP)  2. Gastroesophageal reflux disease without esophagitis PPI as previously discussed will continue  3. Moderate persistent asthma without status asthmaticus without complication Under control at this time   General Counseling: I have discussed the findings of the evaluation and examination with Bonnie West.  I have also discussed any further diagnostic evaluation thatmay be needed or ordered today. Bonnie West verbalizes understanding of the findings of todays visit. We also reviewed her medications today and discussed drug interactions and side effects including but not limited excessive drowsiness and altered mental states. We also discussed that there is always a risk not just to her but also people around her. she has been encouraged to call the office with any questions or concerns that should arise related to todays visit.  No orders of the defined types were placed in this encounter.    Time spent: 44  I have personally obtained a history, examined the patient, evaluated laboratory and imaging results, formulated the assessment and plan and placed orders.    Yevonne Pax, MD [facility] Pulmonary and Critical Care Sleep medicine

## 2023-10-03 NOTE — Telephone Encounter (Signed)
Faxed cpap & supply order to [facility] per patient request-[name]

## 2023-11-09 ENCOUNTER — Ambulatory Visit: Payer: Medicare Other | Admitting: Nurse Practitioner

## 2023-11-09 ENCOUNTER — Telehealth: Payer: Self-pay | Admitting: Internal Medicine

## 2023-11-09 ENCOUNTER — Encounter: Payer: Self-pay | Admitting: Nurse Practitioner

## 2023-11-09 VITALS — BP 132/76 | HR 62 | Temp 98.3°F | Resp 16 | Ht 61.0 in | Wt 140.8 lb

## 2023-11-09 DIAGNOSIS — F411 Generalized anxiety disorder: Secondary | ICD-10-CM

## 2023-11-09 DIAGNOSIS — J4540 Moderate persistent asthma, uncomplicated: Secondary | ICD-10-CM

## 2023-11-09 DIAGNOSIS — Z0000 Encounter for general adult medical examination without abnormal findings: Secondary | ICD-10-CM

## 2023-11-09 DIAGNOSIS — I10 Essential (primary) hypertension: Secondary | ICD-10-CM | POA: Diagnosis not present

## 2023-11-09 DIAGNOSIS — M545 Low back pain, unspecified: Secondary | ICD-10-CM | POA: Diagnosis not present

## 2023-11-09 DIAGNOSIS — G4709 Other insomnia: Secondary | ICD-10-CM

## 2023-11-09 DIAGNOSIS — E039 Hypothyroidism, unspecified: Secondary | ICD-10-CM | POA: Diagnosis not present

## 2023-11-09 DIAGNOSIS — G8929 Other chronic pain: Secondary | ICD-10-CM

## 2023-11-09 DIAGNOSIS — G4733 Obstructive sleep apnea (adult) (pediatric): Secondary | ICD-10-CM

## 2023-11-09 MED ORDER — ATROVENT HFA 17 MCG/ACT IN AERS
INHALATION_SPRAY | RESPIRATORY_TRACT | 3 refills | Status: DC
Start: 2023-11-09 — End: 2024-06-17

## 2023-11-09 MED ORDER — ZOLPIDEM TARTRATE 5 MG PO TABS
2.5000 mg | ORAL_TABLET | Freq: Every evening | ORAL | 0 refills | Status: DC | PRN
Start: 2023-11-09 — End: 2024-03-11

## 2023-11-09 MED ORDER — HYDROCODONE-ACETAMINOPHEN 5-325 MG PO TABS
1.0000 | ORAL_TABLET | Freq: Four times a day (QID) | ORAL | 0 refills | Status: DC | PRN
Start: 2023-11-09 — End: 2024-02-01

## 2023-11-09 MED ORDER — CLINDAMYCIN PHOSPHATE 1 % EX SOLN: 1.0000 | Freq: Two times a day (BID) | CUTANEOUS | 2 refills | Status: DC | PRN

## 2023-11-09 MED ORDER — FLUTICASONE PROPIONATE HFA 110 MCG/ACT IN AERO
INHALATION_SPRAY | RESPIRATORY_TRACT | 3 refills | Status: DC
Start: 2023-11-09 — End: 2024-02-01

## 2023-11-09 MED ORDER — CETIRIZINE HCL 10 MG PO TABS: 10.0000 mg | ORAL_TABLET | Freq: Every day | ORAL | 1 refills | Status: DC

## 2023-11-09 MED ORDER — FLUOXETINE HCL 40 MG PO CAPS: 40.0000 mg | ORAL_CAPSULE | Freq: Every day | ORAL | 1 refills | Status: DC

## 2023-11-09 MED ORDER — FENOFIBRATE 145 MG PO TABS: 145.0000 mg | ORAL_TABLET | Freq: Every day | ORAL | 1 refills | Status: DC

## 2023-11-09 MED ORDER — ALPRAZOLAM 0.25 MG PO TABS
0.2500 mg | ORAL_TABLET | Freq: Two times a day (BID) | ORAL | 2 refills | Status: DC | PRN
Start: 2023-11-09 — End: 2024-02-01

## 2023-11-09 NOTE — Progress Notes (Cosign Needed)
[facility] [address] [place], [place] [address]  Internal MEDICINE  Office Visit Note  Patient Name: Bonnie West  [id number]  [id number]  Date of Service: 11/09/2023  Chief Complaint  Patient presents with   Depression   Gastroesophageal Reflux   Hyperlipidemia   Hypertension   Medicare Wellness    HPI Bonnie West presents for an annual well visit and physical exam.  Well-appearing 72 y.o. female with hypertension, migraines, asthma, OSA, IBS, GERd, hypothyroidism, osteoarthritis, fatty liver, and GAD.  Routine CRC screening: sees GI, due in 2027 Routine mammogram: due in February next year  DEXA scan: done in 2018 Labs: due for routine labs  New or worsening pain: chronic back pain, worse today than usual  Other concerns: multiple medication refills.      11/09/2023   11:17 AM 11/08/2022   11:07 AM 11/04/2021   11:29 AM  MMSE - Mini Mental State Exam  Orientation to time 5 5 5   Orientation to Place 5 5 5   Registration 3 3 3   Attention/ Calculation 5 5 5   Recall 3 3 3   Language- name 2 objects 2 2 2   Language- repeat 1 1 1   Language- follow 3 step command 3 3 3   Language- read & follow direction 1 1 1   Write a sentence 1 1 1   Copy design 1 1 1   Total score 30 30 30     Functional Status Survey: Is the patient deaf or have difficulty hearing?: Yes Does the patient have difficulty seeing, even when wearing glasses/contacts?: Yes Does the patient have difficulty concentrating, remembering, or making decisions?: No Does the patient have difficulty walking or climbing stairs?: No Does the patient have difficulty dressing or bathing?: No Does the patient have difficulty doing errands alone such as visiting a doctor's office or shopping?: No     08/04/2022    1:22 PM 10/22/2022    2:44 PM 11/08/2022   11:05 AM 01/30/2023   11:28 AM 11/09/2023   11:15 AM  Fall Risk  Falls in the past year? 1  1 0 1  Was there an injury with Fall? 1  0 0 0  Fall Risk  Category Calculator 3  2 0 2  Fall Risk Category (Retired) High  Moderate    (RETIRED) Patient Fall Risk Level High fall risk Low fall risk Moderate fall risk    Patient at Risk for Falls Due to Impaired balance/gait   No Fall Risks   Fall risk Follow up   Falls evaluation completed Falls evaluation completed Falls evaluation completed       08/04/2022    1:22 PM  Depression screen PHQ 2/9  Decreased Interest 0  Down, Depressed, Hopeless 0  PHQ - 2 Score 0       Current Medication: Outpatient Encounter Medications as of 11/09/2023  Medication Sig Note   acetaminophen (TYLENOL) 500 MG tablet Take 1,000 mg by mouth 2 (two) times daily as needed for mild pain or moderate pain.    albuterol (VENTOLIN HFA) 108 (90 Base) MCG/ACT inhaler Inhale 1-2 puffs into the lungs every 4 (four) hours as needed for wheezing or shortness of breath.    bisacodyl (DULCOLAX) 5 MG EC tablet Take 5-10 mg by mouth at bedtime as needed for moderate constipation. Two at night    cholecalciferol (VITAMIN D) 1000 units tablet Take 1,000 Units by mouth at bedtime.    cyclobenzaprine (FLEXERIL) 5 MG tablet Take 1-2 tablets (5-10 mg total) by mouth  at bedtime. 04/18/2023: Takes as needed   docusate sodium (COLACE) 100 MG capsule Take 100-200 mg by mouth at bedtime as needed for mild constipation or moderate constipation.     estradiol (ESTRACE) 0.1 MG/GM vaginal cream INSERT 1 GRAM VAGINALLY TWICE WEEKLY AT BEDTIME    ezetimibe (ZETIA) 10 MG tablet Take 1 tablet by mouth daily. 04/18/2023: New med she hasn't started yet due to current illness   isometheptene-acetaminophen-dichloralphenazone (MIDRIN) 65-100-325 MG capsule Take 1 capsule by mouth 4 (four) times daily as needed for migraine. Maximum 5 capsules in 12 hours for migraine headaches, 8 capsules in 24 hours for tension headaches.    levothyroxine (SYNTHROID) 100 MCG tablet TAKE 1 TABLET BY MOUTH EVERY DAY IN THE MORNING    lisinopril (ZESTRIL) 10 MG tablet Take  1 tablet (10 mg total) by mouth daily.    magnesium oxide (MAG-OX) 400 MG tablet Take 400 mg by mouth at bedtime.     montelukast (SINGULAIR) 10 MG tablet TAKE 1 TABLET BY MOUTH EVERY DAY    omeprazole (PRILOSEC) 40 MG capsule TAKE 1 CAPSULE BY MOUTH EVERY DAY    polyethylene glycol powder (GLYCOLAX/MIRALAX) 17 GM/SCOOP powder Take 17 g by mouth every evening.    triamcinolone cream (KENALOG) 0.1 % Apply 1 application topically 2 (two) times daily as needed. For skin breakdown    vitamin B-12 (CYANOCOBALAMIN) 1000 MCG tablet Take 1,000 mcg by mouth daily.    [DISCONTINUED] ALPRAZolam (XANAX) 0.25 MG tablet Take 1 tablet (0.25 mg total) by mouth 2 (two) times daily as needed for anxiety.    [DISCONTINUED] cetirizine (ZYRTEC) 10 MG tablet Take 10 mg by mouth daily.    [DISCONTINUED] clindamycin (CLEOCIN T) 1 % external solution Apply 1 Application topically 2 (two) times daily as needed. For skin breakdown    [DISCONTINUED] fenofibrate (TRICOR) 145 MG tablet Take 1 tablet (145 mg total) by mouth daily.    [DISCONTINUED] FLUoxetine (PROZAC) 40 MG capsule TAKE 1 CAPSULE (40 MG TOTAL) BY MOUTH DAILY.    [DISCONTINUED] fluticasone (FLOVENT HFA) 110 MCG/ACT inhaler USE 2 PUFFS INHALED TWICE DAILY    [DISCONTINUED] HYDROcodone-acetaminophen (NORCO/VICODIN) 5-325 MG tablet Take 1 tablet by mouth every 6 (six) hours as needed for severe pain or moderate pain.    [DISCONTINUED] ipratropium (ATROVENT HFA) 17 MCG/ACT inhaler INHALE 2 PUFFS 4 TIMES DAILY    [DISCONTINUED] predniSONE (DELTASONE) 20 MG tablet 60 mg BID x 3 days, 40 mg BID x 3 days, 20mg  BID x 3 days, then stop    [DISCONTINUED] zolpidem (AMBIEN) 5 MG tablet Take 0.5-1 tablets (2.5-5 mg total) by mouth at bedtime as needed for sleep.    ALPRAZolam (XANAX) 0.25 MG tablet Take 1 tablet (0.25 mg total) by mouth 2 (two) times daily as needed for anxiety.    cetirizine (ZYRTEC) 10 MG tablet Take 1 tablet (10 mg total) by mouth daily.    clindamycin  (CLEOCIN T) 1 % external solution Apply 1 Application topically 2 (two) times daily as needed. For skin breakdown    fenofibrate (TRICOR) 145 MG tablet Take 1 tablet (145 mg total) by mouth daily.    FLUoxetine (PROZAC) 40 MG capsule Take 1 capsule (40 mg total) by mouth daily.    fluticasone (FLOVENT HFA) 110 MCG/ACT inhaler USE 2 PUFFS INHALED TWICE DAILY    HYDROcodone-acetaminophen (NORCO/VICODIN) 5-325 MG tablet Take 1 tablet by mouth every 6 (six) hours as needed for severe pain (pain score 7-10) or moderate pain (pain score 4-6).  ipratropium (ATROVENT HFA) 17 MCG/ACT inhaler INHALE 2 PUFFS 4 TIMES DAILY    ipratropium-albuterol (DUONEB) 0.5-2.5 (3) MG/3ML SOLN TAKE 3 MLS BY NEBULIZATION EVERY 4 (FOUR) HOURS AS NEEDED (SOB, WHEEZING).    zolpidem (AMBIEN) 5 MG tablet Take 0.5-1 tablets (2.5-5 mg total) by mouth at bedtime as needed for sleep.    No facility-administered encounter medications on file as of 11/09/2023.    Surgical History: Past Surgical History:  Procedure Laterality Date   BUNIONECTOMY     CATARACT EXTRACTION W/PHACO Right 10/11/2016   Procedure: CATARACT EXTRACTION PHACO AND INTRAOCULAR LENS PLACEMENT (IOC);  Surgeon: Galen Manila, MD;  Location: [facility] ORS;  Service: Ophthalmology;  Laterality: Right;  Lot# [id number] H [place]: 00:37.7 AP%: 18.1 CDE: 6.80   CATARACT EXTRACTION W/PHACO Left 11/08/2016   Procedure: CATARACT EXTRACTION PHACO AND INTRAOCULAR LENS PLACEMENT (IOC);  Surgeon: Galen Manila, MD;  Location: [facility] ORS;  Service: Ophthalmology;  Laterality: Left;  PACK LOT: [id number] H US:00:32 AP:44 CDE:6.46   COLONOSCOPY     COLONOSCOPY WITH PROPOFOL N/A 11/26/2015   Procedure: COLONOSCOPY WITH PROPOFOL;  Surgeon: Scot Jun, MD;  Location: [facility] ENDOSCOPY;  Service: Endoscopy;  Laterality: N/A;   EYE SURGERY  07/23/2021   EYE SURGERY Left 09/24/2021   strabusmis, double vision   TONSILLECTOMY      Medical History: Past Medical History:  Diagnosis  Date   Allergic rhinitis    Anxiety    Asthma    Blood transfusion without reported diagnosis    Depression    Diverticulosis    Dyspnea    Esophagitis    GERD (gastroesophageal reflux disease)    Headache    Heart murmur    Hyperlipemia    Hypertension    Hypothyroidism    Obesity    Osteopenia    Palpitations    Pneumothorax    Sleep apnea     Family History: Family History  Problem Relation Age of Onset   Breast cancer Maternal Grandmother    Stroke Maternal Grandmother    Bladder Cancer Father    Prostate cancer Father    Heart attack Father    Aortic aneurysm Father    Congestive Heart Failure Father    Colon cancer Paternal Grandmother    Aneurysm Paternal Grandmother    Stroke Paternal Grandmother    Congestive Heart Failure Paternal Grandmother    Dementia Mother    Stroke Mother    Dementia Maternal Grandfather    Stroke Maternal Grandfather    Dementia Paternal Grandfather    Stroke Paternal Grandfather     Social History   Socioeconomic History   Marital status: Married    Spouse name: Not on file   Number of children: Not on file   Years of education: Not on file   Highest education level: Not on file  Occupational History   Not on file  Tobacco Use   Smoking status: Former   Smokeless tobacco: Never  Vaping Use   Vaping status: Never Used  Substance and Sexual Activity   Alcohol use: No   Drug use: No   Sexual activity: Not Currently    Partners: Male    Birth control/protection: Post-menopausal  Other Topics Concern   Not on file  Social History Narrative   Not on file   Social Drivers of Health   Financial Resource Strain: Low Risk  (03/23/2021)   Overall Financial Resource Strain (CARDIA)    Difficulty of Paying Living Expenses: Not very hard  Food  Insecurity: No Food Insecurity (04/19/2023)   Hunger Vital Sign    Worried About Running Out of Food in the Last Year: Never true    Ran Out of Food in the Last Year: Never true   Transportation Needs: No Transportation Needs (04/19/2023)   PRAPARE - Administrator, Civil Service (Medical): No    Lack of Transportation (Non-Medical): No  Physical Activity: Not on file  Stress: Not on file  Social Connections: Not on file  Intimate Partner Violence: Not At Risk (04/19/2023)   Humiliation, Afraid, Rape, and Kick questionnaire    Fear of Current or Ex-Partner: No    Emotionally Abused: No    Physically Abused: No    Sexually Abused: No      Review of Systems  Constitutional:  Negative for activity change, appetite change, chills, fatigue, fever and unexpected weight change.  HENT: Negative.  Negative for congestion, ear pain, rhinorrhea, sore throat and trouble swallowing.   Eyes: Negative.   Respiratory: Negative.  Negative for cough, chest tightness, shortness of breath and wheezing.   Cardiovascular: Negative.  Negative for chest pain.  Gastrointestinal: Negative.  Negative for abdominal pain, blood in stool, constipation, diarrhea, nausea and vomiting.  Endocrine: Negative.   Genitourinary: Negative.  Negative for difficulty urinating, dysuria, frequency, hematuria and urgency.  Musculoskeletal: Negative.  Negative for arthralgias, back pain, joint swelling, myalgias and neck pain.  Skin: Negative.  Negative for rash and wound.  Allergic/Immunologic: Negative.  Negative for immunocompromised state.  Neurological: Negative.  Negative for dizziness, seizures, numbness and headaches.  Hematological: Negative.   Psychiatric/Behavioral: Negative.  Negative for behavioral problems, self-injury and suicidal ideas. The patient is not nervous/anxious.     Vital Signs: BP 132/76 Comment: 150/88  Pulse 62   Temp 98.3 F (36.8 C)   Resp 16   Ht 5\' 1"  (1.549 m)   Wt 140 lb 12.8 oz (63.9 kg)   SpO2 98%   BMI 26.60 kg/m    Physical Exam Vitals reviewed.  Constitutional:      General: She is not in acute distress.    Appearance: Normal  appearance. She is well-developed and normal weight. She is not ill-appearing or diaphoretic.  HENT:     Head: Normocephalic and atraumatic.     Right Ear: Tympanic membrane, ear canal and external ear normal.     Left Ear: Tympanic membrane, ear canal and external ear normal.     Nose: Nose normal. No congestion or rhinorrhea.     Mouth/Throat:     Mouth: Mucous membranes are moist.     Pharynx: Oropharynx is clear. No oropharyngeal exudate or posterior oropharyngeal erythema.  Eyes:     General: No scleral icterus.       Right eye: No discharge.        Left eye: No discharge.     Extraocular Movements: Extraocular movements intact.     Conjunctiva/sclera: Conjunctivae normal.     Pupils: Pupils are equal, round, and reactive to light.  Neck:     Thyroid: No thyromegaly.     Vascular: No JVD.     Trachea: No tracheal deviation.  Cardiovascular:     Rate and Rhythm: Normal rate and regular rhythm.     Pulses: Normal pulses.     Heart sounds: Normal heart sounds. No murmur heard.    No friction rub. No gallop.  Pulmonary:     Effort: Pulmonary effort is normal. No respiratory distress.  Breath sounds: Normal breath sounds. No stridor. No wheezing or rales.  Chest:     Chest wall: No tenderness.     Comments: Declined clinical breast exam, routine mammogram ordered.  Abdominal:     General: Bowel sounds are normal. There is no distension.     Palpations: Abdomen is soft. There is no mass.     Tenderness: There is no abdominal tenderness. There is no guarding or rebound.  Musculoskeletal:        General: No tenderness or deformity. Normal range of motion.     Cervical back: Normal range of motion and neck supple.  Lymphadenopathy:     Cervical: No cervical adenopathy.  Skin:    General: Skin is warm and dry.     Coloration: Skin is not pale.     Findings: No erythema or rash.  Neurological:     Mental Status: She is alert and oriented to person, place, and time.      Cranial Nerves: No cranial nerve deficit.     Motor: No abnormal muscle tone.     Coordination: Coordination normal.     Gait: Gait normal.     Deep Tendon Reflexes: Reflexes are normal and symmetric.  Psychiatric:        Mood and Affect: Mood normal.        Behavior: Behavior normal.        Thought Content: Thought content normal.        Judgment: Judgment normal.        Assessment/Plan: 1. Encounter for subsequent annual wellness visit (AWV) in Medicare patient (Primary) Age-appropriate preventive screenings and vaccinations discussed, annual physical exam completed. Routine labs for health maintenance will be ordered. Routine medication refills ordered. PHM updated.   - clindamycin (CLEOCIN T) 1 % external solution; Apply 1 Application topically 2 (two) times daily as needed. For skin breakdown  Dispense: 30 mL; Refill: 2 - fenofibrate (TRICOR) 145 MG tablet; Take 1 tablet (145 mg total) by mouth daily.  Dispense: 90 tablet; Refill: 1 - cetirizine (ZYRTEC) 10 MG tablet; Take 1 tablet (10 mg total) by mouth daily.  Dispense: 90 tablet; Refill: 1  2. Moderate persistent asthma without status asthmaticus without complication Continue flovent and atrovent inhalers as prescribed.  - fluticasone (FLOVENT HFA) 110 MCG/ACT inhaler; USE 2 PUFFS INHALED TWICE DAILY  Dispense: 36 each; Refill: 3 - ipratropium (ATROVENT HFA) 17 MCG/ACT inhaler; INHALE 2 PUFFS 4 TIMES DAILY  Dispense: 1 each; Refill: 3  3. Chronic midline low back pain without sciatica Continue prn hydrocodone as prescribed.  - HYDROcodone-acetaminophen (NORCO/VICODIN) 5-325 MG tablet; Take 1 tablet by mouth every 6 (six) hours as needed for severe pain (pain score 7-10) or moderate pain (pain score 4-6).  Dispense: 60 tablet; Refill: 0  4. Essential hypertension, benign Stable, continue medications as prescribed.   5. Acquired hypothyroidism Continue levothyroxine as prescribed, will repeat thyroid labs.   6. Other  insomnia Continue ambien as prescribed . - zolpidem (AMBIEN) 5 MG tablet; Take 0.5-1 tablets (2.5-5 mg total) by mouth at bedtime as needed for sleep.  Dispense: 30 tablet; Refill: 0  7. OSA (obstructive sleep apnea) Continue with CPAP use   8. GAD (generalized anxiety disorder) Continue fluoxetine and prn alprazolam as prescribed.  - ALPRAZolam (XANAX) 0.25 MG tablet; Take 1 tablet (0.25 mg total) by mouth 2 (two) times daily as needed for anxiety.  Dispense: 60 tablet; Refill: 2 - FLUoxetine (PROZAC) 40 MG capsule; Take 1 capsule (40  mg total) by mouth daily.  Dispense: 90 capsule; Refill: 1     General Counseling: Bonnie West verbalizes understanding of the findings of todays visit and agrees with plan of treatment. I have discussed any further diagnostic evaluation that may be needed or ordered today. We also reviewed her medications today. she has been encouraged to call the office with any questions or concerns that should arise related to todays visit.    No orders of the defined types were placed in this encounter.   Meds ordered this encounter  Medications   clindamycin (CLEOCIN T) 1 % external solution    Sig: Apply 1 Application topically 2 (two) times daily as needed. For skin breakdown    Dispense:  30 mL    Refill:  2   ALPRAZolam (XANAX) 0.25 MG tablet    Sig: Take 1 tablet (0.25 mg total) by mouth 2 (two) times daily as needed for anxiety.    Dispense:  60 tablet    Refill:  2    F41.0 dx code   fenofibrate (TRICOR) 145 MG tablet    Sig: Take 1 tablet (145 mg total) by mouth daily.    Dispense:  90 tablet    Refill:  1   FLUoxetine (PROZAC) 40 MG capsule    Sig: Take 1 capsule (40 mg total) by mouth daily.    Dispense:  90 capsule    Refill:  1   HYDROcodone-acetaminophen (NORCO/VICODIN) 5-325 MG tablet    Sig: Take 1 tablet by mouth every 6 (six) hours as needed for severe pain (pain score 7-10) or moderate pain (pain score 4-6).    Dispense:  60 tablet     Refill:  0    Disregard previous order and fill this one instead. Continued script, refill please, note increased number of tablets   fluticasone (FLOVENT HFA) 110 MCG/ACT inhaler    Sig: USE 2 PUFFS INHALED TWICE DAILY    Dispense:  36 each    Refill:  3   ipratropium (ATROVENT HFA) 17 MCG/ACT inhaler    Sig: INHALE 2 PUFFS 4 TIMES DAILY    Dispense:  1 each    Refill:  3   zolpidem (AMBIEN) 5 MG tablet    Sig: Take 0.5-1 tablets (2.5-5 mg total) by mouth at bedtime as needed for sleep.    Dispense:  30 tablet    Refill:  0    Do not fill now, patient will call pharmacy when she needs it.   cetirizine (ZYRTEC) 10 MG tablet    Sig: Take 1 tablet (10 mg total) by mouth daily.    Dispense:  90 tablet    Refill:  1    Fill new script today    Return for has not received phone call for new cpap order yet. needs pcp f/u in 12 weeks for refills.   Total time spent:30 Minutes Time spent includes review of chart, medications, test results, and follow up plan with the patient.   [place] Controlled Substance Database was reviewed by me.  This patient was seen by Sallyanne Kuster, FNP-C in collaboration with Dr. Beverely Risen as a part of collaborative care agreement.  Alisah Grandberry R. Tedd Sias, MSN, FNP-C Internal medicine

## 2023-11-09 NOTE — Telephone Encounter (Addendum)
Received cpap & supply cmn from [facility]. Signed by Arlyss Repress. Faxed back to [facility];

## 2023-11-11 ENCOUNTER — Encounter: Payer: Self-pay | Admitting: Nurse Practitioner

## 2023-11-14 ENCOUNTER — Ambulatory Visit: Payer: Medicare Other | Admitting: Nurse Practitioner

## 2023-12-17 ENCOUNTER — Telehealth: Payer: Self-pay

## 2023-12-19 DIAGNOSIS — R1310 Dysphagia, unspecified: Secondary | ICD-10-CM | POA: Diagnosis not present

## 2023-12-19 DIAGNOSIS — R198 Other specified symptoms and signs involving the digestive system and abdomen: Secondary | ICD-10-CM | POA: Diagnosis not present

## 2023-12-19 DIAGNOSIS — R1012 Left upper quadrant pain: Secondary | ICD-10-CM | POA: Diagnosis not present

## 2023-12-19 DIAGNOSIS — K219 Gastro-esophageal reflux disease without esophagitis: Secondary | ICD-10-CM | POA: Diagnosis not present

## 2023-12-19 DIAGNOSIS — Z860101 Personal history of adenomatous and serrated colon polyps: Secondary | ICD-10-CM | POA: Diagnosis not present

## 2023-12-19 DIAGNOSIS — R1032 Left lower quadrant pain: Secondary | ICD-10-CM | POA: Diagnosis not present

## 2023-12-19 DIAGNOSIS — R933 Abnormal findings on diagnostic imaging of other parts of digestive tract: Secondary | ICD-10-CM | POA: Diagnosis not present

## 2023-12-20 ENCOUNTER — Other Ambulatory Visit: Payer: Self-pay | Admitting: Nurse Practitioner

## 2023-12-20 DIAGNOSIS — Z860101 Personal history of adenomatous and serrated colon polyps: Secondary | ICD-10-CM

## 2023-12-20 DIAGNOSIS — R1012 Left upper quadrant pain: Secondary | ICD-10-CM

## 2023-12-20 DIAGNOSIS — R1032 Left lower quadrant pain: Secondary | ICD-10-CM

## 2023-12-20 DIAGNOSIS — K219 Gastro-esophageal reflux disease without esophagitis: Secondary | ICD-10-CM

## 2023-12-20 DIAGNOSIS — R198 Other specified symptoms and signs involving the digestive system and abdomen: Secondary | ICD-10-CM

## 2023-12-27 ENCOUNTER — Other Ambulatory Visit: Payer: Self-pay | Admitting: Nurse Practitioner

## 2023-12-27 DIAGNOSIS — Z860101 Personal history of adenomatous and serrated colon polyps: Secondary | ICD-10-CM

## 2023-12-27 DIAGNOSIS — R933 Abnormal findings on diagnostic imaging of other parts of digestive tract: Secondary | ICD-10-CM

## 2023-12-27 DIAGNOSIS — R1310 Dysphagia, unspecified: Secondary | ICD-10-CM

## 2023-12-27 DIAGNOSIS — R053 Chronic cough: Secondary | ICD-10-CM

## 2024-01-04 ENCOUNTER — Ambulatory Visit
Admission: RE | Admit: 2024-01-04 | Discharge: 2024-01-04 | Disposition: A | Discharge status: Home / Self Care | Payer: Medicare Other | Source: Ambulatory Visit | Attending: Nurse Practitioner | Admitting: Nurse Practitioner

## 2024-01-04 DIAGNOSIS — R1032 Left lower quadrant pain: Secondary | ICD-10-CM | POA: Diagnosis not present

## 2024-01-04 DIAGNOSIS — K219 Gastro-esophageal reflux disease without esophagitis: Secondary | ICD-10-CM | POA: Insufficient documentation

## 2024-01-04 DIAGNOSIS — Z860101 Personal history of adenomatous and serrated colon polyps: Secondary | ICD-10-CM | POA: Diagnosis not present

## 2024-01-04 DIAGNOSIS — R1012 Left upper quadrant pain: Secondary | ICD-10-CM | POA: Diagnosis not present

## 2024-01-04 DIAGNOSIS — R198 Other specified symptoms and signs involving the digestive system and abdomen: Secondary | ICD-10-CM | POA: Diagnosis not present

## 2024-01-04 DIAGNOSIS — K5900 Constipation, unspecified: Secondary | ICD-10-CM | POA: Diagnosis not present

## 2024-01-04 MED ORDER — IOHEXOL 300 MG/ML  SOLN
100.0000 mL | Freq: Once | INTRAMUSCULAR | Status: AC | PRN
  Administered 2024-01-04: 100 mL via INTRAVENOUS

## 2024-01-11 ENCOUNTER — Ambulatory Visit: Payer: Medicare Other

## 2024-01-11 ENCOUNTER — Telehealth: Payer: Self-pay

## 2024-01-11 NOTE — Telephone Encounter (Addendum)
 The patient called in and left a voicemail requesting information about her swallow study. I called her back to let her know that we got her message, and I inform her she will have to call [facility] because she is their patient. I gave her the number and transfer her to them. The patient said that she spoke to [place], and she gave her a number to reschedule her study. She said that her appointment is at 1:00 pm January 25, 2024.

## 2024-01-25 ENCOUNTER — Ambulatory Visit
Admission: RE | Admit: 2024-01-25 | Discharge: 2024-01-25 | Disposition: A | Discharge status: Home / Self Care | Payer: Medicare Other | Source: Ambulatory Visit | Attending: Nurse Practitioner | Admitting: Nurse Practitioner

## 2024-01-25 DIAGNOSIS — Z860101 Personal history of adenomatous and serrated colon polyps: Secondary | ICD-10-CM | POA: Insufficient documentation

## 2024-01-25 DIAGNOSIS — R053 Chronic cough: Secondary | ICD-10-CM | POA: Diagnosis not present

## 2024-01-25 DIAGNOSIS — R933 Abnormal findings on diagnostic imaging of other parts of digestive tract: Secondary | ICD-10-CM | POA: Diagnosis not present

## 2024-01-25 DIAGNOSIS — R1310 Dysphagia, unspecified: Secondary | ICD-10-CM | POA: Insufficient documentation

## 2024-01-25 DIAGNOSIS — R638 Other symptoms and signs concerning food and fluid intake: Secondary | ICD-10-CM | POA: Diagnosis not present

## 2024-01-25 NOTE — Therapy (Signed)
  Modified Barium Swallow Study  Patient Details  Name: Bonnie West MRN: [id number] Date of Birth: 01-31-1951  Today's Date: 01/25/2024  Modified Barium Swallow completed.  Full report located under Chart Review in the Imaging Section.  History of Present Illness 73 y.o. female who present for MBSS due to complaints of intermittent coughing with thin lliquids. Pt with hx of GERD, constipation, diarrhea, edematous polyp of colon. UGI 08/03/23, "1. Moderate volume spontaneous gastroesophageal reflux extending to  mid esophagus. Otherwise normal upper GI. 2.  No aspiration seen on today's examination."   Clinical Impression Pt presents with a mild pharyngeal dysphagia which is likely due to age-related swallowing changes in setting of respiratory disease (asthma/?COPD). Pt with likely concomitant esophageal dysphagia; see results of recent esophagram for details. Pt with inconsistent before the swallow penetration or transient aspiration with thin liquids. Cued throat clearing effective in clearing penetrated material when on vocal cord. Otherwise, penetrate cleared with laryngeal vestibule closure. Penetration to the vocal cords and transient aspiration most appreciable with large or sequential sips of thin liquids. Recommend continuation of a regular diet with thin liquids with safe swallowing strategies/aspiration precautions as outlined below as well as reflux precautions. No f/u ST services recommended at this time. Factors that may increase risk of adverse event in presence of aspiration Bonnie West & Bonnie West 2021): Respiratory or GI disease  Swallow Evaluation Recommendations Recommendations: PO diet PO Diet Recommendation: Regular;Thin liquids (Level 0) Liquid Administration via: Spoon;Cup;Straw Medication Administration:  (as tolerated) Supervision: Patient able to self-feed Swallowing strategies  : Minimize environmental distractions;Slow rate;Small bites/sips;Clear throat  intermittently Postural changes: Position pt fully upright for meals;Out of bed for meals (upright 60-90 minutes after meals) Oral care recommendations: Oral care BID (2x/day) Recommended consults: Consider esophageal assessment (GI following; pending EGD per pt)    Bonnie West, M.S., CCC-SLP Speech-Language Pathologist [facility] [phone] (ASCOM)   Bonnie West 01/25/2024,1:51 PM

## 2024-02-01 ENCOUNTER — Encounter: Payer: Self-pay | Admitting: Nurse Practitioner

## 2024-02-01 ENCOUNTER — Ambulatory Visit (INDEPENDENT_AMBULATORY_CARE_PROVIDER_SITE_OTHER): Payer: Medicare Other | Admitting: Nurse Practitioner

## 2024-02-01 VITALS — BP 131/74 | HR 69 | Temp 97.8°F | Resp 16 | Ht 61.0 in | Wt 142.0 lb

## 2024-02-01 DIAGNOSIS — N813 Complete uterovaginal prolapse: Secondary | ICD-10-CM | POA: Diagnosis not present

## 2024-02-01 DIAGNOSIS — I700 Atherosclerosis of aorta: Secondary | ICD-10-CM | POA: Diagnosis not present

## 2024-02-01 DIAGNOSIS — I2510 Atherosclerotic heart disease of native coronary artery without angina pectoris: Secondary | ICD-10-CM | POA: Insufficient documentation

## 2024-02-01 DIAGNOSIS — I10 Essential (primary) hypertension: Secondary | ICD-10-CM

## 2024-02-01 DIAGNOSIS — F411 Generalized anxiety disorder: Secondary | ICD-10-CM

## 2024-02-01 DIAGNOSIS — E039 Hypothyroidism, unspecified: Secondary | ICD-10-CM

## 2024-02-01 DIAGNOSIS — Z79899 Other long term (current) drug therapy: Secondary | ICD-10-CM

## 2024-02-01 LAB — POCT URINE DRUG SCREEN
Methylenedioxyamphetamine: NOT DETECTED
POC Amphetamine UR: NOT DETECTED
POC BENZODIAZEPINES UR: POSITIVE — AB
POC Barbiturate UR: NOT DETECTED
POC Cocaine UR: NOT DETECTED
POC Ecstasy UR: NOT DETECTED
POC Marijuana UR: NOT DETECTED
POC Methadone UR: NOT DETECTED
POC Methamphetamine UR: NOT DETECTED
POC Opiate Ur: NOT DETECTED
POC Oxycodone UR: NOT DETECTED
POC PHENCYCLIDINE UR: NOT DETECTED
POC TRICYCLICS UR: NOT DETECTED

## 2024-02-01 MED ORDER — FLUTICASONE PROPIONATE HFA 110 MCG/ACT IN AERO: INHALATION_SPRAY | RESPIRATORY_TRACT | 3 refills | Status: DC

## 2024-02-01 MED ORDER — FENOFIBRATE 145 MG PO TABS: 145.0000 mg | ORAL_TABLET | Freq: Every day | ORAL | 1 refills | Status: DC

## 2024-02-01 MED ORDER — FLUOXETINE HCL 40 MG PO CAPS: 40.0000 mg | ORAL_CAPSULE | Freq: Every day | ORAL | 1 refills | Status: DC

## 2024-02-01 MED ORDER — HYDROCODONE-ACETAMINOPHEN 5-325 MG PO TABS
1.0000 | ORAL_TABLET | Freq: Four times a day (QID) | ORAL | 0 refills | Status: DC | PRN
Start: 2024-02-01 — End: 2024-03-11

## 2024-02-01 MED ORDER — LEVOTHYROXINE SODIUM 100 MCG PO TABS: ORAL_TABLET | ORAL | 3 refills | Status: AC

## 2024-02-01 MED ORDER — ALPRAZOLAM 0.25 MG PO TABS: 0.2500 mg | ORAL_TABLET | Freq: Two times a day (BID) | ORAL | 2 refills | Status: DC | PRN

## 2024-02-01 MED ORDER — EZETIMIBE 10 MG PO TABS: 10.0000 mg | ORAL_TABLET | Freq: Every day | ORAL | 3 refills | Status: AC

## 2024-02-01 MED ORDER — LISINOPRIL 10 MG PO TABS
10.0000 mg | ORAL_TABLET | Freq: Every day | ORAL | 3 refills | Status: DC
Start: 2024-02-01 — End: 2024-09-26

## 2024-02-01 NOTE — Progress Notes (Signed)
 [facility] [address] [place], [place] [address]  Internal MEDICINE  Office Visit Note  Patient Name: Bonnie West  [id number]  [id number]  Date of Service: 02/01/2024  Chief Complaint  Patient presents with   Follow-up   Depression    HPI Rogene presents for a follow-up visit for CT scan results, anxiety, aortic atherosclerosis, uterine prolapse, high calcium, labs and refills Anxiety -- takes prn alprazolam. Aortic atherosclerosis and coronary artery calcifications on Ct scan, severity not noted.  Complete uterine prolapse and pelvic floor dysfunction and relaxation -- needs gyn referral for hysterectomy. CBC and CMP done -- calcium level was high at 10.7. she does not take calcium supplement. Needs parathyroid function evaluated.  Due for cholesterol panel, thyroid function testing and vitamin D level as we..     Current Medication: Outpatient Encounter Medications as of 02/01/2024  Medication Sig Note   acetaminophen (TYLENOL) 500 MG tablet Take 1,000 mg by mouth 2 (two) times daily as needed for mild pain or moderate pain.    albuterol (VENTOLIN HFA) 108 (90 Base) MCG/ACT inhaler Inhale 1-2 puffs into the lungs every 4 (four) hours as needed for wheezing or shortness of breath.    bisacodyl (DULCOLAX) 5 MG EC tablet Take 5-10 mg by mouth at bedtime as needed for moderate constipation. Two at night    cetirizine (ZYRTEC) 10 MG tablet Take 1 tablet (10 mg total) by mouth daily.    cholecalciferol (VITAMIN D) 1000 units tablet Take 1,000 Units by mouth at bedtime.    clindamycin (CLEOCIN T) 1 % external solution Apply 1 Application topically 2 (two) times daily as needed. For skin breakdown    cyclobenzaprine (FLEXERIL) 5 MG tablet Take 1-2 tablets (5-10 mg total) by mouth at bedtime. 04/18/2023: Takes as needed   docusate sodium (COLACE) 100 MG capsule Take 100-200 mg by mouth at bedtime as needed for mild constipation or moderate constipation.     estradiol  (ESTRACE) 0.1 MG/GM vaginal cream INSERT 1 GRAM VAGINALLY TWICE WEEKLY AT BEDTIME    ipratropium (ATROVENT HFA) 17 MCG/ACT inhaler INHALE 2 PUFFS 4 TIMES DAILY    isometheptene-acetaminophen-dichloralphenazone (MIDRIN) 65-100-325 MG capsule Take 1 capsule by mouth 4 (four) times daily as needed for migraine. Maximum 5 capsules in 12 hours for migraine headaches, 8 capsules in 24 hours for tension headaches.    magnesium oxide (MAG-OX) 400 MG tablet Take 400 mg by mouth at bedtime.     montelukast (SINGULAIR) 10 MG tablet TAKE 1 TABLET BY MOUTH EVERY DAY    omeprazole (PRILOSEC) 40 MG capsule TAKE 1 CAPSULE BY MOUTH EVERY DAY    polyethylene glycol powder (GLYCOLAX/MIRALAX) 17 GM/SCOOP powder Take 17 g by mouth every evening.    triamcinolone cream (KENALOG) 0.1 % Apply 1 application topically 2 (two) times daily as needed. For skin breakdown    vitamin B-12 (CYANOCOBALAMIN) 1000 MCG tablet Take 1,000 mcg by mouth daily.    zolpidem (AMBIEN) 5 MG tablet Take 0.5-1 tablets (2.5-5 mg total) by mouth at bedtime as needed for sleep.    [DISCONTINUED] ALPRAZolam (XANAX) 0.25 MG tablet Take 1 tablet (0.25 mg total) by mouth 2 (two) times daily as needed for anxiety.    [DISCONTINUED] ezetimibe (ZETIA) 10 MG tablet Take 1 tablet by mouth daily. 04/18/2023: New med she hasn't started yet due to current illness   [DISCONTINUED] fenofibrate (TRICOR) 145 MG tablet Take 1 tablet (145 mg total) by mouth daily.    [DISCONTINUED] FLUoxetine (PROZAC) 40  MG capsule Take 1 capsule (40 mg total) by mouth daily.    [DISCONTINUED] fluticasone (FLOVENT HFA) 110 MCG/ACT inhaler USE 2 PUFFS INHALED TWICE DAILY    [DISCONTINUED] HYDROcodone-acetaminophen (NORCO/VICODIN) 5-325 MG tablet Take 1 tablet by mouth every 6 (six) hours as needed for severe pain (pain score 7-10) or moderate pain (pain score 4-6).    [DISCONTINUED] levothyroxine (SYNTHROID) 100 MCG tablet TAKE 1 TABLET BY MOUTH EVERY DAY IN THE MORNING     [DISCONTINUED] lisinopril (ZESTRIL) 10 MG tablet Take 1 tablet (10 mg total) by mouth daily.    ALPRAZolam (XANAX) 0.25 MG tablet Take 1 tablet (0.25 mg total) by mouth 2 (two) times daily as needed for anxiety.    ezetimibe (ZETIA) 10 MG tablet Take 1 tablet (10 mg total) by mouth daily.    fenofibrate (TRICOR) 145 MG tablet Take 1 tablet (145 mg total) by mouth daily.    FLUoxetine (PROZAC) 40 MG capsule Take 1 capsule (40 mg total) by mouth daily.    fluticasone (FLOVENT HFA) 110 MCG/ACT inhaler USE 2 PUFFS INHALED TWICE DAILY    HYDROcodone-acetaminophen (NORCO/VICODIN) 5-325 MG tablet Take 1 tablet by mouth every 6 (six) hours as needed for severe pain (pain score 7-10) or moderate pain (pain score 4-6).    ipratropium-albuterol (DUONEB) 0.5-2.5 (3) MG/3ML SOLN TAKE 3 MLS BY NEBULIZATION EVERY 4 (FOUR) HOURS AS NEEDED (SOB, WHEEZING).    levothyroxine (SYNTHROID) 100 MCG tablet TAKE 1 TABLET BY MOUTH EVERY DAY IN THE MORNING    lisinopril (ZESTRIL) 10 MG tablet Take 1 tablet (10 mg total) by mouth daily.    No facility-administered encounter medications on file as of 02/01/2024.    Surgical History: Past Surgical History:  Procedure Laterality Date   BUNIONECTOMY     CATARACT EXTRACTION W/PHACO Right 10/11/2016   Procedure: CATARACT EXTRACTION PHACO AND INTRAOCULAR LENS PLACEMENT (IOC);  Surgeon: Galen Manila, MD;  Location: [facility] ORS;  Service: Ophthalmology;  Laterality: Right;  Lot# [id number] H [place]: 00:37.7 AP%: 18.1 CDE: 6.80   CATARACT EXTRACTION W/PHACO Left 11/08/2016   Procedure: CATARACT EXTRACTION PHACO AND INTRAOCULAR LENS PLACEMENT (IOC);  Surgeon: Galen Manila, MD;  Location: [facility] ORS;  Service: Ophthalmology;  Laterality: Left;  PACK LOT: [id number] H US:00:32 AP:44 CDE:6.46   COLONOSCOPY     COLONOSCOPY WITH PROPOFOL N/A 11/26/2015   Procedure: COLONOSCOPY WITH PROPOFOL;  Surgeon: Scot Jun, MD;  Location: [facility] ENDOSCOPY;  Service: Endoscopy;  Laterality: N/A;    EYE SURGERY  07/23/2021   EYE SURGERY Left 09/24/2021   strabusmis, double vision   TONSILLECTOMY      Medical History: Past Medical History:  Diagnosis Date   Allergic rhinitis    Anxiety    Asthma    Blood transfusion without reported diagnosis    Depression    Diverticulosis    Dyspnea    Esophagitis    GERD (gastroesophageal reflux disease)    Headache    Heart murmur    Hyperlipemia    Hypertension    Hypothyroidism    Obesity    Osteopenia    Palpitations    Pneumothorax    Sleep apnea     Family History: Family History  Problem Relation Age of Onset   Breast cancer Maternal Grandmother    Stroke Maternal Grandmother    Bladder Cancer Father    Prostate cancer Father    Heart attack Father    Aortic aneurysm Father    Congestive Heart Failure Father    Colon  cancer Paternal Grandmother    Aneurysm Paternal Grandmother    Stroke Paternal Grandmother    Congestive Heart Failure Paternal Grandmother    Dementia Mother    Stroke Mother    Dementia Maternal Grandfather    Stroke Maternal Grandfather    Dementia Paternal Grandfather    Stroke Paternal Grandfather     Social History   Socioeconomic History   Marital status: Married    Spouse name: Not on file   Number of children: Not on file   Years of education: Not on file   Highest education level: Not on file  Occupational History   Not on file  Tobacco Use   Smoking status: Former   Smokeless tobacco: Never  Vaping Use   Vaping status: Never Used  Substance and Sexual Activity   Alcohol use: No   Drug use: No   Sexual activity: Not Currently    Partners: Male    Birth control/protection: Post-menopausal  Other Topics Concern   Not on file  Social History Narrative   Not on file   Social Drivers of Health   Financial Resource Strain: Low Risk  (03/23/2021)   Overall Financial Resource Strain (CARDIA)    Difficulty of Paying Living Expenses: Not very hard  Food Insecurity: No Food  Insecurity (04/19/2023)   Hunger Vital Sign    Worried About Running Out of Food in the Last Year: Never true    Ran Out of Food in the Last Year: Never true  Transportation Needs: No Transportation Needs (04/19/2023)   PRAPARE - Administrator, Civil Service (Medical): No    Lack of Transportation (Non-Medical): No  Physical Activity: Not on file  Stress: Not on file  Social Connections: Not on file  Intimate Partner Violence: Not At Risk (04/19/2023)   Humiliation, Afraid, Rape, and Kick questionnaire    Fear of Current or Ex-Partner: No    Emotionally Abused: No    Physically Abused: No    Sexually Abused: No      Review of Systems  Constitutional:  Negative for chills, fatigue and unexpected weight change.  HENT:  Negative for congestion, rhinorrhea, sneezing and sore throat.   Eyes:  Negative for redness.  Respiratory:  Negative for cough, chest tightness and shortness of breath.   Cardiovascular: Negative.  Negative for chest pain and palpitations.  Gastrointestinal: Negative.  Negative for abdominal pain, constipation, diarrhea, nausea and vomiting.  Genitourinary:  Negative for dysuria and frequency.  Musculoskeletal:  Positive for arthralgias and back pain. Negative for joint swelling and neck pain.  Skin:  Negative for rash.  Neurological: Negative.  Negative for tremors and numbness.  Hematological:  Negative for adenopathy. Does not bruise/bleed easily.  Psychiatric/Behavioral:  Positive for sleep disturbance (improved with ambien). Negative for behavioral problems (Depression), self-injury and suicidal ideas. The patient is nervous/anxious (stable with medications).     Vital Signs: BP 131/74   Pulse 69   Temp 97.8 F (36.6 C)   Resp 16   Ht 5\' 1"  (1.549 m)   Wt 142 lb (64.4 kg)   SpO2 97%   BMI 26.83 kg/m    Physical Exam Vitals reviewed.  Constitutional:      General: She is not in acute distress.    Appearance: Normal appearance. She is  normal weight. She is not ill-appearing.  HENT:     Head: Normocephalic and atraumatic.  Eyes:     Extraocular Movements: Extraocular movements intact.  Pupils: Pupils are equal, round, and reactive to light.  Cardiovascular:     Rate and Rhythm: Normal rate and regular rhythm.  Pulmonary:     Effort: Pulmonary effort is normal. No respiratory distress.  Neurological:     Mental Status: She is alert and oriented to person, place, and time.     Cranial Nerves: No cranial nerve deficit.     Coordination: Coordination normal.     Gait: Gait normal.  Psychiatric:        Mood and Affect: Mood normal.        Behavior: Behavior normal.        Assessment/Plan: 1. Essential hypertension, benign (Primary) Stable, continue lisinopril as prescribed.  - lisinopril (ZESTRIL) 10 MG tablet; Take 1 tablet (10 mg total) by mouth daily.  Dispense: 90 tablet; Refill: 3  2. Acquired hypothyroidism Continue levothyroxine as prescribed. Routine labs ordered  - TSH + free T4 - levothyroxine (SYNTHROID) 100 MCG tablet; TAKE 1 TABLET BY MOUTH EVERY DAY IN THE MORNING  Dispense: 90 tablet; Refill: 3  3. Aortic atherosclerosis (HCC) Continue fenofibrate and zetia as prescribed. Routine lab ordered. CT scan ordered to evaluate atherosclerosis further.  - Lipid Profile - CT CORONARY MORPH W/CTA COR W/SCORE W/CA W/CM &/OR WO/CM; Future - ezetimibe (ZETIA) 10 MG tablet; Take 1 tablet (10 mg total) by mouth daily.  Dispense: 90 tablet; Refill: 3 - fenofibrate (TRICOR) 145 MG tablet; Take 1 tablet (145 mg total) by mouth daily.  Dispense: 90 tablet; Refill: 1  4. Coronary artery calcification seen on CT scan Routine lab ordered. CT scan ordered to evaluate coronary artery calcifications.  - Lipid Profile - CT CORONARY MORPH W/CTA COR W/SCORE W/CA W/CM &/OR WO/CM; Future  5. Hypercalcemia Routine labs ordered, labs ordered to further evaluate for hyperparathyroidism.  - Vitamin D (25 hydroxy) -  Ca+Creat+P+PTH Intact  6. Complete uterovaginal prolapse Referred to OBGYN - Ambulatory referral to Obstetrics / Gynecology  7. GAD (generalized anxiety disorder) Continue fluoxetine and prn alprazolam as prescribed. Follow up in 3 months for additional refills of alprazolam.  - ALPRAZolam (XANAX) 0.25 MG tablet; Take 1 tablet (0.25 mg total) by mouth 2 (two) times daily as needed for anxiety.  Dispense: 60 tablet; Refill: 2 - FLUoxetine (PROZAC) 40 MG capsule; Take 1 capsule (40 mg total) by mouth daily.  Dispense: 90 capsule; Refill: 1  8. Encounter for long-term (current) use of medications UDS was done today, results is consistent with current prescriptions. Medication list reviewed, updated, and medication refills ordered.  - POCT Urine Drug Screen - fluticasone (FLOVENT HFA) 110 MCG/ACT inhaler; USE 2 PUFFS INHALED TWICE DAILY  Dispense: 36 each; Refill: 3 - HYDROcodone-acetaminophen (NORCO/VICODIN) 5-325 MG tablet; Take 1 tablet by mouth every 6 (six) hours as needed for severe pain (pain score 7-10) or moderate pain (pain score 4-6).  Dispense: 60 tablet; Refill: 0   General Counseling: Shunna verbalizes understanding of the findings of todays visit and agrees with plan of treatment. I have discussed any further diagnostic evaluation that may be needed or ordered today. We also reviewed her medications today. she has been encouraged to call the office with any questions or concerns that should arise related to todays visit.    Orders Placed This Encounter  Procedures   CT CORONARY MORPH W/CTA COR W/SCORE W/CA W/CM &/OR WO/CM   Lipid Profile   TSH + free T4   Vitamin D (25 hydroxy)   Ca+Creat+P+PTH Intact   POCT Urine Drug  Screen    Meds ordered this encounter  Medications   ALPRAZolam (XANAX) 0.25 MG tablet    Sig: Take 1 tablet (0.25 mg total) by mouth 2 (two) times daily as needed for anxiety.    Dispense:  60 tablet    Refill:  2    F41.0 dx code   ezetimibe (ZETIA)  10 MG tablet    Sig: Take 1 tablet (10 mg total) by mouth daily.    Dispense:  90 tablet    Refill:  3   fenofibrate (TRICOR) 145 MG tablet    Sig: Take 1 tablet (145 mg total) by mouth daily.    Dispense:  90 tablet    Refill:  1   FLUoxetine (PROZAC) 40 MG capsule    Sig: Take 1 capsule (40 mg total) by mouth daily.    Dispense:  90 capsule    Refill:  1   fluticasone (FLOVENT HFA) 110 MCG/ACT inhaler    Sig: USE 2 PUFFS INHALED TWICE DAILY    Dispense:  36 each    Refill:  3   HYDROcodone-acetaminophen (NORCO/VICODIN) 5-325 MG tablet    Sig: Take 1 tablet by mouth every 6 (six) hours as needed for severe pain (pain score 7-10) or moderate pain (pain score 4-6).    Dispense:  60 tablet    Refill:  0    Refill for march   lisinopril (ZESTRIL) 10 MG tablet    Sig: Take 1 tablet (10 mg total) by mouth daily.    Dispense:  90 tablet    Refill:  3    For future refills   levothyroxine (SYNTHROID) 100 MCG tablet    Sig: TAKE 1 TABLET BY MOUTH EVERY DAY IN THE MORNING    Dispense:  90 tablet    Refill:  3    For future refills    Return in about 3 months (around 04/24/2024) for F/U, anxiety med refill, pain med refill, Rashena Dowling PCP.   Total time spent:30 Minutes Time spent includes review of chart, medications, test results, and follow up plan with the patient.   [place] Controlled Substance Database was reviewed by me.  This patient was seen by Sallyanne Kuster, FNP-C in collaboration with Dr. Beverely Risen as a part of collaborative care agreement.   Yaviel Kloster R. Tedd Sias, MSN, FNP-C Internal medicine

## 2024-02-02 DIAGNOSIS — E039 Hypothyroidism, unspecified: Secondary | ICD-10-CM | POA: Diagnosis not present

## 2024-02-02 DIAGNOSIS — I700 Atherosclerosis of aorta: Secondary | ICD-10-CM | POA: Diagnosis not present

## 2024-02-02 DIAGNOSIS — I2510 Atherosclerotic heart disease of native coronary artery without angina pectoris: Secondary | ICD-10-CM | POA: Diagnosis not present

## 2024-02-03 LAB — TSH+FREE T4
Free T4: 1.79 ng/dL — ABNORMAL HIGH (ref 0.82–1.77)
TSH: 0.211 u[IU]/mL — ABNORMAL LOW (ref 0.450–4.500)

## 2024-02-03 LAB — VITAMIN D 25 HYDROXY (VIT D DEFICIENCY, FRACTURES): Vit D, 25-Hydroxy: 52.6 ng/mL (ref 30.0–100.0)

## 2024-02-03 LAB — CA+CREAT+P+PTH INTACT
Calcium: 10.2 mg/dL (ref 8.7–10.3)
Creatinine, Ser: 0.84 mg/dL (ref 0.57–1.00)
PTH: 36 pg/mL (ref 15–65)
Phosphorus: 3 mg/dL (ref 3.0–4.3)
eGFR: 73 mL/min/{1.73_m2} (ref >59)

## 2024-02-03 LAB — LIPID PANEL
Chol/HDL Ratio: 3.2 ratio (ref 0.0–4.4)
Cholesterol, Total: 150 mg/dL (ref 100–199)
HDL: 47 mg/dL (ref >39)
LDL Chol Calc (NIH): 85 mg/dL (ref 0–99)
Triglycerides: 98 mg/dL (ref 0–149)
VLDL Cholesterol Cal: 18 mg/dL (ref 5–40)

## 2024-02-04 ENCOUNTER — Encounter: Payer: Self-pay | Admitting: Nurse Practitioner

## 2024-02-07 ENCOUNTER — Telehealth: Payer: Self-pay | Admitting: Nurse Practitioner

## 2024-02-07 NOTE — Telephone Encounter (Signed)
 OB/GYN referral faxed to [facility] per patient request ; [phone]. Notified patient. Gave pt telephone [phone]

## 2024-02-14 ENCOUNTER — Encounter (HOSPITAL_COMMUNITY): Payer: Self-pay

## 2024-02-14 ENCOUNTER — Telehealth (HOSPITAL_COMMUNITY): Payer: Self-pay | Admitting: *Deleted

## 2024-02-14 MED ORDER — METOPROLOL TARTRATE 50 MG PO TABS: ORAL_TABLET | ORAL | 0 refills | Status: DC

## 2024-02-14 NOTE — Telephone Encounter (Signed)
 Received call from patient regarding upcoming cardiac imaging study; pt verbalizes understanding of appt date/time, parking situation and where to check in, pre-test NPO status and medications ordered, and verified current allergies; name and call back number provided for further questions should they arise Johney Frame RN Navigator Cardiac Imaging [facility] [phone] office [phone] cell

## 2024-02-14 NOTE — Telephone Encounter (Signed)
 Attempted to call patient regarding upcoming cardiac CT appointment. Left message on voicemail with name and callback number  Larey Brick RN Navigator Cardiac Imaging [facility] [phone] Office [phone] Cell

## 2024-02-15 ENCOUNTER — Ambulatory Visit
Admission: RE | Admit: 2024-02-15 | Discharge: 2024-02-15 | Disposition: A | Discharge status: Home / Self Care | Source: Ambulatory Visit | Attending: Nurse Practitioner | Admitting: Nurse Practitioner

## 2024-02-15 DIAGNOSIS — R9430 Abnormal result of cardiovascular function study, unspecified: Secondary | ICD-10-CM

## 2024-02-15 DIAGNOSIS — I700 Atherosclerosis of aorta: Secondary | ICD-10-CM | POA: Insufficient documentation

## 2024-02-15 DIAGNOSIS — I2510 Atherosclerotic heart disease of native coronary artery without angina pectoris: Secondary | ICD-10-CM | POA: Insufficient documentation

## 2024-02-15 MED ORDER — NITROGLYCERIN 0.4 MG SL SUBL
0.8000 mg | SUBLINGUAL_TABLET | Freq: Once | SUBLINGUAL | Status: AC
  Administered 2024-02-15: 0.8 mg via SUBLINGUAL

## 2024-02-15 MED ORDER — SODIUM CHLORIDE 0.9 % IV SOLN: INTRAVENOUS | Status: DC

## 2024-02-15 MED ORDER — IOHEXOL 350 MG/ML SOLN
75.0000 mL | Freq: Once | INTRAVENOUS | Status: AC | PRN
Start: 2024-02-15 — End: 2024-02-15
  Administered 2024-02-15: 75 mL via INTRAVENOUS

## 2024-02-15 NOTE — Progress Notes (Signed)
 Patient tolerated CT well. Drank water after. Vital signs stable encourage to drink water throughout day.Reasons explained and verbalized understanding. Ambulated steady gait.

## 2024-02-21 NOTE — Progress Notes (Signed)
 Will review labs with patient at her upcoming appt this month

## 2024-02-21 NOTE — Progress Notes (Signed)
 Will discuss results with the patient at her upcoming appt this month

## 2024-02-23 ENCOUNTER — Encounter: Payer: Self-pay | Admitting: *Deleted

## 2024-02-23 NOTE — Progress Notes (Signed)
 [facility] [address] [place], [place] [address]  Pulmonary Sleep Medicine   Office Visit Note  Patient Name: RIONA LAHTI DOB: 1950-12-19 MRN [id number]    Chief Complaint: Obstructive Sleep Apnea visit  Brief History:  Basha is seen today for a follow up visit for CPAP@ 5 cmH2O. The patient has a 1 year history of sleep apnea. Patient is using PAP nightly.  The patient feels rested after sleeping with PAP.  The patient reports benefiting from PAP use. Reported sleepiness is  improved and the Epworth Sleepiness Score is 13 out of 24. The patient will occasionally take naps. The patient complains of the following: fatigue.  The compliance download shows 86% compliance with an average use time of 7 hours 45 minutes. The AHI is 7.5.  The patient does complain of limb movements disrupting sleep. The patient continues to require PAP therapy in order to eliminate sleep apnea.   ROS  General: (-) fever, (-) chills, (-) night sweat Nose and Sinuses: (-) nasal stuffiness or itchiness, (-) postnasal drip, (-) nosebleeds, (-) sinus trouble. Mouth and Throat: (-) sore throat, (-) hoarseness. Neck: (-) swollen glands, (-) enlarged thyroid, (-) neck pain. Respiratory: + cough, + shortness of breath, + wheezing. Neurologic: - numbness, - tingling. Psychiatric: + anxiety, - depression   Current Medication: Outpatient Encounter Medications as of 02/26/2024  Medication Sig Note   acetaminophen (TYLENOL) 500 MG tablet Take 1,000 mg by mouth 2 (two) times daily as needed for mild pain or moderate pain.    albuterol (VENTOLIN HFA) 108 (90 Base) MCG/ACT inhaler Inhale 1-2 puffs into the lungs every 4 (four) hours as needed for wheezing or shortness of breath.    ALPRAZolam (XANAX) 0.25 MG tablet Take 1 tablet (0.25 mg total) by mouth 2 (two) times daily as needed for anxiety.    bisacodyl (DULCOLAX) 5 MG EC tablet Take 5-10 mg by mouth at bedtime as needed for moderate constipation.  Two at night    cetirizine (ZYRTEC) 10 MG tablet Take 1 tablet (10 mg total) by mouth daily.    cholecalciferol (VITAMIN D) 1000 units tablet Take 1,000 Units by mouth at bedtime.    clindamycin (CLEOCIN T) 1 % external solution Apply 1 Application topically 2 (two) times daily as needed. For skin breakdown    cyclobenzaprine (FLEXERIL) 5 MG tablet Take 1-2 tablets (5-10 mg total) by mouth at bedtime. 04/18/2023: Takes as needed   docusate sodium (COLACE) 100 MG capsule Take 100-200 mg by mouth at bedtime as needed for mild constipation or moderate constipation.     estradiol (ESTRACE) 0.1 MG/GM vaginal cream INSERT 1 GRAM VAGINALLY TWICE WEEKLY AT BEDTIME    ezetimibe (ZETIA) 10 MG tablet Take 1 tablet (10 mg total) by mouth daily.    fenofibrate (TRICOR) 145 MG tablet Take 1 tablet (145 mg total) by mouth daily.    FLUoxetine (PROZAC) 40 MG capsule Take 1 capsule (40 mg total) by mouth daily.    fluticasone (FLOVENT HFA) 110 MCG/ACT inhaler USE 2 PUFFS INHALED TWICE DAILY    HYDROcodone-acetaminophen (NORCO/VICODIN) 5-325 MG tablet Take 1 tablet by mouth every 6 (six) hours as needed for severe pain (pain score 7-10) or moderate pain (pain score 4-6).    ipratropium (ATROVENT HFA) 17 MCG/ACT inhaler INHALE 2 PUFFS 4 TIMES DAILY    ipratropium-albuterol (DUONEB) 0.5-2.5 (3) MG/3ML SOLN TAKE 3 MLS BY NEBULIZATION EVERY 4 (FOUR) HOURS AS NEEDED (SOB, WHEEZING).    isometheptene-acetaminophen-dichloralphenazone (MIDRIN) 65-100-325 MG  capsule Take 1 capsule by mouth 4 (four) times daily as needed for migraine. Maximum 5 capsules in 12 hours for migraine headaches, 8 capsules in 24 hours for tension headaches.    levothyroxine (SYNTHROID) 100 MCG tablet TAKE 1 TABLET BY MOUTH EVERY DAY IN THE MORNING    lisinopril (ZESTRIL) 10 MG tablet Take 1 tablet (10 mg total) by mouth daily.    magnesium oxide (MAG-OX) 400 MG tablet Take 400 mg by mouth at bedtime.     montelukast (SINGULAIR) 10 MG tablet TAKE 1  TABLET BY MOUTH EVERY DAY    omeprazole (PRILOSEC) 40 MG capsule TAKE 1 CAPSULE BY MOUTH EVERY DAY    polyethylene glycol powder (GLYCOLAX/MIRALAX) 17 GM/SCOOP powder Take 17 g by mouth every evening.    triamcinolone cream (KENALOG) 0.1 % Apply 1 application topically 2 (two) times daily as needed. For skin breakdown    vitamin B-12 (CYANOCOBALAMIN) 1000 MCG tablet Take 1,000 mcg by mouth daily.    zolpidem (AMBIEN) 5 MG tablet Take 0.5-1 tablets (2.5-5 mg total) by mouth at bedtime as needed for sleep.    [DISCONTINUED] metoprolol tartrate (LOPRESSOR) 50 MG tablet Take tablet (50mg ) by mouth TWO hours prior to your cardiac CT scan.    No facility-administered encounter medications on file as of 02/26/2024.    Surgical History: Past Surgical History:  Procedure Laterality Date   BUNIONECTOMY     CATARACT EXTRACTION W/PHACO Right 10/11/2016   Procedure: CATARACT EXTRACTION PHACO AND INTRAOCULAR LENS PLACEMENT (IOC);  Surgeon: Galen Manila, MD;  Location: [facility] ORS;  Service: Ophthalmology;  Laterality: Right;  Lot# [id number] H [place]: 00:37.7 AP%: 18.1 CDE: 6.80   CATARACT EXTRACTION W/PHACO Left 11/08/2016   Procedure: CATARACT EXTRACTION PHACO AND INTRAOCULAR LENS PLACEMENT (IOC);  Surgeon: Galen Manila, MD;  Location: [facility] ORS;  Service: Ophthalmology;  Laterality: Left;  PACK LOT: [id number] H US:00:32 AP:44 CDE:6.46   COLONOSCOPY     COLONOSCOPY WITH PROPOFOL N/A 11/26/2015   Procedure: COLONOSCOPY WITH PROPOFOL;  Surgeon: Scot Jun, MD;  Location: [facility] ENDOSCOPY;  Service: Endoscopy;  Laterality: N/A;   EYE SURGERY  07/23/2021   EYE SURGERY Left 09/24/2021   strabusmis, double vision   TONSILLECTOMY      Medical History: Past Medical History:  Diagnosis Date   Allergic rhinitis    Anxiety    Asthma    Blood transfusion without reported diagnosis    Depression    Diverticulosis    Dyspnea    Esophagitis    GERD (gastroesophageal reflux disease)    Headache     Heart murmur    Hyperlipemia    Hypertension    Hypothyroidism    Obesity    Osteopenia    Palpitations    Pneumothorax    Sleep apnea     Family History: Non contributory to the present illness  Social History: Social History   Socioeconomic History   Marital status: Married    Spouse name: Not on file   Number of children: Not on file   Years of education: Not on file   Highest education level: Not on file  Occupational History   Not on file  Tobacco Use   Smoking status: Former   Smokeless tobacco: Never  Vaping Use   Vaping status: Never Used  Substance and Sexual Activity   Alcohol use: No   Drug use: No   Sexual activity: Not Currently    Partners: Male    Birth control/protection: Post-menopausal  Other Topics Concern  Not on file  Social History Narrative   Not on file   Social Drivers of Health   Financial Resource Strain: Low Risk  (03/23/2021)   Overall Financial Resource Strain (CARDIA)    Difficulty of Paying Living Expenses: Not very hard  Food Insecurity: No Food Insecurity (04/19/2023)   Hunger Vital Sign    Worried About Running Out of Food in the Last Year: Never true    Ran Out of Food in the Last Year: Never true  Transportation Needs: No Transportation Needs (04/19/2023)   PRAPARE - Administrator, Civil Service (Medical): No    Lack of Transportation (Non-Medical): No  Physical Activity: Not on file  Stress: Not on file  Social Connections: Not on file  Intimate Partner Violence: Not At Risk (04/19/2023)   Humiliation, Afraid, Rape, and Kick questionnaire    Fear of Current or Ex-Partner: No    Emotionally Abused: No    Physically Abused: No    Sexually Abused: No    Vital Signs: Blood pressure 138/79, pulse 66, resp. rate 16, height 5\' 1"  (1.549 m), weight 145 lb (65.8 kg), SpO2 97%. Body mass index is 27.4 kg/m.    Examination: General Appearance: The patient is well-developed, well-nourished, and in no  distress. Neck Circumference: 39 cm Skin: Gross inspection of skin unremarkable. Head: normocephalic, no gross deformities. Eyes: no gross deformities noted. ENT: ears appear grossly normal Neurologic: Alert and oriented. No involuntary movements.  STOP BANG RISK ASSESSMENT S (snore) Have you been told that you snore?     NO   T (tired) Are you often tired, fatigued, or sleepy during the day?   YES  O (obstruction) Do you stop breathing, choke, or gasp during sleep? NO   P (pressure) Do you have or are you being treated for high blood pressure? YES   B (BMI) Is your body index greater than 35 kg/m? YES   A (age) Are you 31 years old or older? YES   N (neck) Do you have a neck circumference greater than 16 inches?   NO   G (gender) Are you a female? NO   TOTAL STOP/BANG "YES" ANSWERS 4       A STOP-Bang score of 2 or less is considered low risk, and a score of 5 or more is high risk for having either moderate or severe OSA. For people who score 3 or 4, doctors may need to perform further assessment to determine how likely they are to have OSA.         EPWORTH SLEEPINESS SCALE:  Scale:  (0)= no chance of dozing; (1)= slight chance of dozing; (2)= moderate chance of dozing; (3)= high chance of dozing  Chance  Situtation    Sitting and reading: 3    Watching TV: 1    Sitting Inactive in public: 3    As a passenger in car: 3      Lying down to rest: 2    Sitting and talking: 1    Sitting quielty after lunch: 0    In a car, stopped in traffic: 0   TOTAL SCORE:   13 out of 24    SLEEP STUDIES:  PSG (03/2023) AHI 19.5/hr, Supine AHI 27/hr, min SpO2 87%, PLMI 85 Titration (08/2023) CPAP@ 5 cmH2O   CPAP COMPLIANCE DATA:  Date Range: 12/07/2023-02/24/2024  Average Daily Use: 7 hours 45 minutes  Median Use: 8 hours 7 minutes  Compliance for > 4 Hours: 86%  AHI: 7.5 respiratory events per hour  Days Used: 78/90 days  Mask Leak: 23.9  95th Percentile  Pressure: 5         LABS: Recent Results (from the past 2160 hours)  POCT Urine Drug Screen     Status: Abnormal   Collection Time: 02/01/24  2:08 PM  Result Value Ref Range   POC Methamphetamine UR None Detected None Detected   POC Opiate Ur None Detected None Detected   POC Barbiturate UR None Detected None Detected   POC Amphetamine UR None Detected None Detected   POC Oxycodone UR None Detected None Detected   POC Cocaine UR None Detected None Detected   POC Ecstasy UR None Detected None Detected   POC TRICYCLICS UR None Detected None Detected   POC PHENCYCLIDINE UR None Detected None Detected   POC Marijuana UR None Detected None Detected   POC Methadone UR None Detected None Detected   POC BENZODIAZEPINES UR Positive (A) None Detected   URINE TEMPERATURE     POC DRUG SCREEN OXIDANTS URINE     POC SPECIFIC GRAVITY URINE     POC PH URINE     Methylenedioxyamphetamine None Detected None Detected  Lipid Profile     Status: None   Collection Time: 02/02/24  2:28 PM  Result Value Ref Range   Cholesterol, Total 150 100 - 199 mg/dL   Triglycerides 98 0 - 149 mg/dL   HDL 47 >16 mg/dL   VLDL Cholesterol Cal 18 5 - 40 mg/dL   LDL Chol Calc (NIH) 85 0 - 99 mg/dL   Chol/HDL Ratio 3.2 0.0 - 4.4 ratio    Comment:                                   T. Chol/HDL Ratio                                             Men  Women                               1/2 Avg.Risk  3.4    3.3                                   Avg.Risk  5.0    4.4                                2X Avg.Risk  9.6    7.1                                3X Avg.Risk 23.4   11.0   TSH + free T4     Status: Abnormal   Collection Time: 02/02/24  2:28 PM  Result Value Ref Range   TSH 0.211 (L) 0.450 - 4.500 uIU/mL   Free T4 1.79 (H) 0.82 - 1.77 ng/dL  Vitamin D (25 hydroxy)     Status: None   Collection Time: 02/02/24  2:28 PM  Result Value Ref Range   Vit D, 25-Hydroxy 52.6 30.0 -  100.0 ng/mL    Comment: Vitamin D  deficiency has been defined by the Institute of Medicine and an Endocrine Society practice guideline as a level of serum 25-OH vitamin D less than 20 ng/mL (1,2). The Endocrine Society went on to further define vitamin D insufficiency as a level between 21 and 29 ng/mL (2). 1. IOM (Institute of Medicine). 2010. Dietary reference    intakes for calcium and D. [place]: The    [employer]. 2. Holick MF, Binkley [place], Bischoff-Ferrari HA, et al.    Evaluation, treatment, and prevention of vitamin D    deficiency: an Endocrine Society clinical practice    guideline. JCEM. 2011 Jul; 96(7):1911-30.   Ca+Creat+P+PTH Intact     Status: None   Collection Time: 02/02/24  2:28 PM  Result Value Ref Range   Calcium 10.2 8.7 - 10.3 mg/dL   Phosphorus 3.0 3.0 - 4.3 mg/dL   Creatinine, Ser 1.61 0.57 - 1.00 mg/dL   eGFR 73 >09 UE/AVW/0.98   PTH 36 15 - 65 pg/mL   PTH Interp Comment     Comment: Interpretation                 Intact PTH    Calcium                                 (pg/mL)      (mg/dL) Normal                          15 - 65     8.6 - 10.2 Primary Hyperparathyroidism         >65          >10.2 Secondary Hyperparathyroidism       >65          <10.2 Non-Parathyroid Hypercalcemia       <65          >10.2 Hypoparathyroidism                  <15          < 8.6 Non-Parathyroid Hypocalcemia    15 - 65          < 8.6     Radiology: CT CORONARY MORPH W/CTA COR W/SCORE W/CA W/CM &/OR WO/CM Addendum Date: 02/19/2024 ADDENDUM REPORT: 02/19/2024 17:28 ADDENDUM: OVER-READ INTERPRETATION  CT CHEST The following report is an over-read performed by radiologist Dr. Rowe [facility] on 02/09/2024. This over-read does not include interpretation of cardiac or coronary anatomy or pathology. The interpretation by the cardiologist is attached. COMPARISON:  CT chest Mar 28, 2022 FINDINGS: Within the visualized portions of the lungs demonstrates no acute infiltrates  consolidations. No pulmonary nodules or masses. No pleural effusions. No mediastinal masses or adenopathy. No pericardial effusions. Aorta appears normal caliber. No acute findings in the upper abdomen. Chest wall and soft tissues are unremarkable without evidence of acute bony abnormalities. Moderate coronary artery calcifications. Chronic interstitial lung disease with ground-glass appearance of both lungs IMPRESSION: *Chronic interstitial lung disease with ground-glass appearance of both lungs. *Moderate coronary artery calcifications. Electronically Signed   By: Shaaron Adler M.D.   On: 02/19/2024 17:28   Result Date: 02/19/2024 CLINICAL DATA:  Chest pressure EXAM: Cardiac/Coronary  CTA TECHNIQUE: The patient was scanned on a Siemens Somatom go.Top scanner. : A retrospective scan was triggered in the ascending thoracic  aorta. Axial non-contrast 3 mm slices were carried out through the heart. The data set was analyzed on a dedicated work station and scored using the Agatson method. Gantry rotation speed was 330 msecs and collimation was .6 mm. 50mg  of metoprolol and 0.8 mg of sl NTG was given. The 3D data set was reconstructed in 5% intervals of the 60-95 % of the R-R cycle. Diastolic phases were analyzed on a dedicated work station using MPR, MIP and VRT modes. The patient received 75 cc of contrast. FINDINGS: Aorta:  Normal size.  Aortic wall calcifications.  No dissection. Aortic Valve:  Trileaflet. Minimal calcifications. Coronary Arteries:  Normal coronary origin.  Right dominance. RCA is a dominant artery. There is calcified plaque proximally causing minimal stenosis (<25%). Left main gives rise to LAD and LCX arteries. LM has no disease. LAD has no plaque. LCX is a non-dominant artery.  There is no plaque. Other findings: Normal pulmonary vein drainage into the left atrium. Normal left atrial appendage without a thrombus. Normal size of the pulmonary artery. IMPRESSION: 1. Coronary calcium score of 62.2.  This was 55th percentile for age and sex matched control. 2. Normal coronary origin with right dominance. 3. Minimal RCA stenosis (<25%). 4. CAD-RADS 1. Minimal non-obstructive CAD (0-24%). Consider preventive therapy and risk factor modification. Electronically Signed: By: Debbe Odea M.D. On: 02/15/2024 14:25    No results found.  CT CORONARY MORPH W/CTA COR W/SCORE W/CA W/CM &/OR WO/CM Addendum Date: 02/19/2024 ADDENDUM REPORT: 02/19/2024 17:28 ADDENDUM: OVER-READ INTERPRETATION  CT CHEST The following report is an over-read performed by radiologist Dr. Rowe [facility] on 02/09/2024. This over-read does not include interpretation of cardiac or coronary anatomy or pathology. The interpretation by the cardiologist is attached. COMPARISON:  CT chest Mar 28, 2022 FINDINGS: Within the visualized portions of the lungs demonstrates no acute infiltrates consolidations. No pulmonary nodules or masses. No pleural effusions. No mediastinal masses or adenopathy. No pericardial effusions. Aorta appears normal caliber. No acute findings in the upper abdomen. Chest wall and soft tissues are unremarkable without evidence of acute bony abnormalities. Moderate coronary artery calcifications. Chronic interstitial lung disease with ground-glass appearance of both lungs IMPRESSION: *Chronic interstitial lung disease with ground-glass appearance of both lungs. *Moderate coronary artery calcifications. Electronically Signed   By: Shaaron Adler M.D.   On: 02/19/2024 17:28   Result Date: 02/19/2024 CLINICAL DATA:  Chest pressure EXAM: Cardiac/Coronary  CTA TECHNIQUE: The patient was scanned on a Siemens Somatom go.Top scanner. : A retrospective scan was triggered in the ascending thoracic aorta. Axial non-contrast 3 mm slices were carried out through the heart. The data set was analyzed on a dedicated work station and scored using the Agatson method. Gantry rotation speed was 330 msecs and collimation  was .6 mm. 50mg  of metoprolol and 0.8 mg of sl NTG was given. The 3D data set was reconstructed in 5% intervals of the 60-95 % of the R-R cycle. Diastolic phases were analyzed on a dedicated work station using MPR, MIP and VRT modes. The patient received 75 cc of contrast. FINDINGS: Aorta:  Normal size.  Aortic wall calcifications.  No dissection. Aortic Valve:  Trileaflet. Minimal calcifications. Coronary Arteries:  Normal coronary origin.  Right dominance. RCA is a dominant artery. There is calcified plaque proximally causing minimal stenosis (<25%). Left main gives rise to LAD and LCX arteries. LM has no disease. LAD has no plaque. LCX is a non-dominant artery.  There is no plaque. Other findings: Normal  pulmonary vein drainage into the left atrium. Normal left atrial appendage without a thrombus. Normal size of the pulmonary artery. IMPRESSION: 1. Coronary calcium score of 62.2. This was 55th percentile for age and sex matched control. 2. Normal coronary origin with right dominance. 3. Minimal RCA stenosis (<25%). 4. CAD-RADS 1. Minimal non-obstructive CAD (0-24%). Consider preventive therapy and risk factor modification. Electronically Signed: By: Debbe Odea M.D. On: 02/15/2024 14:25      Assessment and Plan: Patient Active Problem List   Diagnosis Date Noted   OSA (obstructive sleep apnea) 02/26/2024   CPAP use counseling 02/26/2024   Aortic atherosclerosis (HCC) 02/01/2024   Coronary artery calcification seen on CT scan 02/01/2024   Complete uterovaginal prolapse 02/01/2024   Asthma exacerbation 04/18/2023   Vaginal atrophy 08/20/2020   Diastolic dysfunction 07/15/2020   OSA on CPAP 07/15/2020   Tightness in chest 05/20/2020   Hypercalcemia 05/20/2020   Primary insomnia 05/20/2020   Acute non-recurrent pansinusitis 02/12/2020   Encounter for general adult medical examination with abnormal findings 11/10/2019   Gastroesophageal reflux disease without esophagitis 11/10/2019    Dysuria 11/10/2019   Acute otitis externa of left ear 07/15/2019   Irritable bowel syndrome with constipation 07/15/2019   Chronic midline low back pain without sciatica 04/27/2019   Allergic rhinitis due to pollen 04/27/2019   Essential hypertension, benign 04/27/2019   GAD (generalized anxiety disorder) 09/01/2018   Chronic migraine without aura without status migrainosus, not intractable 09/01/2018   Encounter for long-term (current) use of medications 09/01/2018   Asthma without status asthmaticus 01/30/2018   Hyperlipidemia, unspecified 01/30/2018   Hypertension 01/30/2018   Obesity, unspecified 01/30/2018   Hypothyroidism 01/30/2018   Fatty infiltration of liver 12/10/2015   Abdominal pain, LLQ (left lower quadrant) 11/03/2015   History of adenomatous polyp of colon 11/03/2015   Hallux valgus, left 09/18/2014   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   Shoulder arthritis 12/19/2013    1. OSA (obstructive sleep apnea) (Primary) The patient does tolerate PAP and reports  benefit from PAP use.Her apnea is not optimally controlled as her AHI is 7.5. We will try a change to APAP 5-10 with a 2 week download.  The patient was reminded how to clean equipment and advised to replace supplies routinely.  The compliance is  good.    OSA on cpap- not controlled. Change to APAP 5-10 with 2 week download. Continue with good  compliance with pap. CPAP continues to be medically necessary to treat this patient's OSA. F/u one year.    2. CPAP use counseling CPAP Counseling: had a lengthy discussion with the patient regarding the importance of PAP therapy in management of the sleep apnea. Patient appears to understand the risk factor reduction and also understands the risks associated with untreated sleep apnea. Patient will try to make a good faith effort to remain compliant with therapy. Also instructed the patient on proper cleaning of the device including the water must be changed daily if  possible and use of distilled water is preferred. Patient understands that the machine should be regularly cleaned with appropriate recommended cleaning solutions that do not damage the PAP machine for example given white vinegar and water rinses. Other methods such as ozone treatment may not be as good as these simple methods to achieve cleaning.     General Counseling: I have discussed the findings of the evaluation and examination with Bonita Quin.  I have also discussed any further diagnostic evaluation thatmay be needed or ordered today. Bonita Quin  verbalizes understanding of the findings of todays visit. We also reviewed her medications today and discussed drug interactions and side effects including but not limited excessive drowsiness and altered mental states. We also discussed that there is always a risk not just to her but also people around her. she has been encouraged to call the office with any questions or concerns that should arise related to todays visit.  No orders of the defined types were placed in this encounter.       I have personally obtained a history, examined the patient, evaluated laboratory and imaging results, formulated the assessment and plan and placed orders. This patient was seen today by Emmaline Kluver, PA-C in collaboration with Dr. Freda Munro.   Yevonne Pax, MD [facility] Diplomate ABMS Pulmonary Critical Care Medicine and Sleep Medicine

## 2024-02-26 ENCOUNTER — Ambulatory Visit (INDEPENDENT_AMBULATORY_CARE_PROVIDER_SITE_OTHER): Admitting: Internal Medicine

## 2024-02-26 VITALS — BP 138/79 | HR 66 | Resp 16 | Ht 61.0 in | Wt 145.0 lb

## 2024-02-26 DIAGNOSIS — Z7189 Other specified counseling: Secondary | ICD-10-CM

## 2024-02-26 DIAGNOSIS — G4733 Obstructive sleep apnea (adult) (pediatric): Secondary | ICD-10-CM

## 2024-02-26 NOTE — Patient Instructions (Signed)

## 2024-03-04 ENCOUNTER — Encounter
Admission: RE | Disposition: A | Discharge status: Home / Self Care | Payer: Self-pay | Source: Home / Self Care | Attending: Gastroenterology

## 2024-03-04 ENCOUNTER — Encounter: Payer: Self-pay | Admitting: *Deleted

## 2024-03-04 ENCOUNTER — Ambulatory Visit
Admission: RE | Admit: 2024-03-04 | Discharge: 2024-03-04 | Disposition: A | Discharge status: Home / Self Care | Payer: Medicare Other | Attending: Gastroenterology | Admitting: Gastroenterology

## 2024-03-04 ENCOUNTER — Ambulatory Visit: Admitting: Certified Registered"

## 2024-03-04 DIAGNOSIS — Q399 Congenital malformation of esophagus, unspecified: Secondary | ICD-10-CM | POA: Insufficient documentation

## 2024-03-04 DIAGNOSIS — Z7951 Long term (current) use of inhaled steroids: Secondary | ICD-10-CM | POA: Insufficient documentation

## 2024-03-04 DIAGNOSIS — F32A Depression, unspecified: Secondary | ICD-10-CM | POA: Diagnosis not present

## 2024-03-04 DIAGNOSIS — J45909 Unspecified asthma, uncomplicated: Secondary | ICD-10-CM | POA: Diagnosis not present

## 2024-03-04 DIAGNOSIS — I10 Essential (primary) hypertension: Secondary | ICD-10-CM | POA: Diagnosis not present

## 2024-03-04 DIAGNOSIS — K317 Polyp of stomach and duodenum: Secondary | ICD-10-CM | POA: Diagnosis not present

## 2024-03-04 DIAGNOSIS — K640 First degree hemorrhoids: Secondary | ICD-10-CM | POA: Diagnosis not present

## 2024-03-04 DIAGNOSIS — Z79899 Other long term (current) drug therapy: Secondary | ICD-10-CM | POA: Diagnosis not present

## 2024-03-04 DIAGNOSIS — Z1211 Encounter for screening for malignant neoplasm of colon: Secondary | ICD-10-CM | POA: Diagnosis not present

## 2024-03-04 DIAGNOSIS — G4733 Obstructive sleep apnea (adult) (pediatric): Secondary | ICD-10-CM | POA: Insufficient documentation

## 2024-03-04 DIAGNOSIS — Z87891 Personal history of nicotine dependence: Secondary | ICD-10-CM | POA: Insufficient documentation

## 2024-03-04 DIAGNOSIS — E785 Hyperlipidemia, unspecified: Secondary | ICD-10-CM | POA: Diagnosis not present

## 2024-03-04 DIAGNOSIS — G4730 Sleep apnea, unspecified: Secondary | ICD-10-CM | POA: Insufficient documentation

## 2024-03-04 DIAGNOSIS — K649 Unspecified hemorrhoids: Secondary | ICD-10-CM | POA: Diagnosis not present

## 2024-03-04 DIAGNOSIS — K449 Diaphragmatic hernia without obstruction or gangrene: Secondary | ICD-10-CM | POA: Diagnosis not present

## 2024-03-04 DIAGNOSIS — Z79890 Hormone replacement therapy (postmenopausal): Secondary | ICD-10-CM | POA: Insufficient documentation

## 2024-03-04 DIAGNOSIS — R1310 Dysphagia, unspecified: Secondary | ICD-10-CM | POA: Diagnosis not present

## 2024-03-04 DIAGNOSIS — K2100 Gastro-esophageal reflux disease with esophagitis, without bleeding: Secondary | ICD-10-CM | POA: Insufficient documentation

## 2024-03-04 DIAGNOSIS — Z09 Encounter for follow-up examination after completed treatment for conditions other than malignant neoplasm: Secondary | ICD-10-CM | POA: Diagnosis not present

## 2024-03-04 DIAGNOSIS — Z860101 Personal history of adenomatous and serrated colon polyps: Secondary | ICD-10-CM | POA: Insufficient documentation

## 2024-03-04 HISTORY — PX: COLONOSCOPY WITH PROPOFOL: SHX5780

## 2024-03-04 HISTORY — PX: ESOPHAGOGASTRODUODENOSCOPY (EGD) WITH PROPOFOL: SHX5813

## 2024-03-04 SURGERY — COLONOSCOPY WITH PROPOFOL
Anesthesia: Monitor Anesthesia Care

## 2024-03-04 MED ORDER — SODIUM CHLORIDE 0.9 % IV SOLN
INTRAVENOUS | Status: DC
  Administered 2024-03-04: 1000 mL via INTRAVENOUS

## 2024-03-04 MED ORDER — LIDOCAINE HCL (CARDIAC) PF 100 MG/5ML IV SOSY
PREFILLED_SYRINGE | INTRAVENOUS | Status: DC | PRN
  Administered 2024-03-04 (×2): 50 mg via INTRAVENOUS
  Administered 2024-03-04: 100 mg via INTRAVENOUS

## 2024-03-04 MED ORDER — PROPOFOL 500 MG/50ML IV EMUL
INTRAVENOUS | Status: DC | PRN
  Administered 2024-03-04: 150 ug/kg/min via INTRAVENOUS
  Administered 2024-03-04 (×2): 25 mg via INTRAVENOUS

## 2024-03-04 NOTE — Anesthesia Preprocedure Evaluation (Signed)
 Anesthesia Evaluation  Patient identified by MRN, date of birth, ID band Patient awake    Reviewed: Allergy & Precautions, H&P , NPO status , Patient's Chart, lab work & pertinent test results, reviewed documented beta blocker date and time   History of Anesthesia Complications Negative for: history of anesthetic complications  Airway Mallampati: I  TM Distance: >3 FB Neck ROM: full    Dental no notable dental hx. (+) Caps, Teeth Intact Permanent bridge:   Pulmonary neg shortness of breath, asthma , sleep apnea and Continuous Positive Airway Pressure Ventilation , neg COPD, neg recent URI, former smoker   Pulmonary exam normal breath sounds clear to auscultation       Cardiovascular Exercise Tolerance: Good hypertension, (-) angina (-) CAD, (-) Past MI, (-) Cardiac Stents and (-) CABG Normal cardiovascular exam(-) dysrhythmias + Valvular Problems/Murmurs  Rhythm:regular Rate:Normal     Neuro/Psych  PSYCHIATRIC DISORDERS (Depression)      negative neurological ROS     GI/Hepatic Neg liver ROS,GERD  Medicated and Controlled,,  Endo/Other  neg diabetesHypothyroidism    Renal/GU      Musculoskeletal   Abdominal   Peds  Hematology negative hematology ROS (+)   Anesthesia Other Findings Past Medical History:   Asthma                                                       Hypertension                                                 Abdominal pain                                               Allergic rhinitis                                            Depression                                                   Hyperlipemia                                                 Headache                                                     Obesity  Osteopenia                                                   Pneumothorax                                                  Esophagitis                                                  Sleep apnea                                                  Hypothyroidism                                               Reproductive/Obstetrics negative OB ROS                             Anesthesia Physical Anesthesia Plan  ASA: III  Anesthesia Plan: MAC   Post-op Pain Management: Minimal or no pain anticipated   Induction: Intravenous  PONV Risk Score and Plan: 3 and Propofol infusion, TIVA and Ondansetron  Airway Management Planned: Nasal Cannula  Additional Equipment: None  Intra-op Plan:   Post-operative Plan:   Informed Consent: I have reviewed the patients History and Physical, chart, labs and discussed the procedure including the risks, benefits and alternatives for the proposed anesthesia with the patient or authorized representative who has indicated his/her understanding and acceptance.     Dental Advisory Given  Plan Discussed with: Anesthesiologist, CRNA and Surgeon  Anesthesia Plan Comments: (Discussed risks of anesthesia with patient, including possibility of difficulty with spontaneous ventilation under anesthesia necessitating airway intervention, PONV, and rare risks such as cardiac or respiratory or neurological events, and allergic reactions. Discussed the role of CRNA in patient's perioperative care. Patient understands.)       Anesthesia Quick Evaluation

## 2024-03-04 NOTE — Anesthesia Postprocedure Evaluation (Signed)
 Anesthesia Post Note  Patient: Bonnie West  Procedure(s) Performed: COLONOSCOPY WITH PROPOFOL ESOPHAGOGASTRODUODENOSCOPY (EGD) WITH PROPOFOL  Patient location during evaluation: Endoscopy Anesthesia Type: MAC Level of consciousness: awake and alert Pain management: pain level controlled Vital Signs Assessment: post-procedure vital signs reviewed and stable Respiratory status: spontaneous breathing, nonlabored ventilation, respiratory function stable and patient connected to nasal cannula oxygen Cardiovascular status: blood pressure returned to baseline and stable Postop Assessment: no apparent nausea or vomiting Anesthetic complications: no  There were no known notable events for this encounter.   Last Vitals:  Vitals:   03/04/24 0917 03/04/24 0927  BP: 114/64 137/73  Pulse:    Resp:    Temp:    SpO2:      Last Pain:  Vitals:   03/04/24 0927  TempSrc:   PainSc: 0-No pain                 Enrique Harvest

## 2024-03-04 NOTE — H&P (Signed)
 Outpatient short stay form Pre-procedure 03/04/2024  [name], MD  Primary Physician: Sallyanne Kuster, NP  Reason for visit:  Dysphagia/History of polyps  History of present illness:    73 y/o lady with history of hyperlipidemia, OSA, and hypertension here for EGD/Colonoscopy due to GERD/dysphagia and history of polyps. No blood thinners. No first degree relatives with GI malignancies. No abdominal surgeries but thinks she has prolapsed uterus and/or rectum.    Current Facility-Administered Medications:    0.9 %  sodium chloride infusion, , Intravenous, Continuous, Tayquan Gassman, Rossie Muskrat, MD, Last Rate: 20 mL/hr at 03/04/24 0813, 1,000 mL at 03/04/24 0813  Medications Prior to Admission  Medication Sig Dispense Refill Last Dose/Taking   acetaminophen (TYLENOL) 500 MG tablet Take 1,000 mg by mouth 2 (two) times daily as needed for mild pain or moderate pain.   03/03/2024 Morning   albuterol (VENTOLIN HFA) 108 (90 Base) MCG/ACT inhaler Inhale 1-2 puffs into the lungs every 4 (four) hours as needed for wheezing or shortness of breath. 18 each 5 03/04/2024 Morning   ALPRAZolam (XANAX) 0.25 MG tablet Take 1 tablet (0.25 mg total) by mouth 2 (two) times daily as needed for anxiety. 60 tablet 2 Past Week   bisacodyl (DULCOLAX) 5 MG EC tablet Take 5-10 mg by mouth at bedtime as needed for moderate constipation. Two at night   03/03/2024   cetirizine (ZYRTEC) 10 MG tablet Take 1 tablet (10 mg total) by mouth daily. 90 tablet 1 Past Week   cholecalciferol (VITAMIN D) 1000 units tablet Take 1,000 Units by mouth at bedtime.   Past Week   clindamycin (CLEOCIN T) 1 % external solution Apply 1 Application topically 2 (two) times daily as needed. For skin breakdown 30 mL 2 Past Week   docusate sodium (COLACE) 100 MG capsule Take 100-200 mg by mouth at bedtime as needed for mild constipation or moderate constipation.    Past Month   estradiol (ESTRACE) 0.1 MG/GM vaginal cream INSERT 1 GRAM VAGINALLY  TWICE WEEKLY AT BEDTIME 42.5 g 1 Past Week   FLUoxetine (PROZAC) 40 MG capsule Take 1 capsule (40 mg total) by mouth daily. 90 capsule 1 03/03/2024 Morning   fluticasone (FLOVENT HFA) 110 MCG/ACT inhaler USE 2 PUFFS INHALED TWICE DAILY 36 each 3 03/04/2024 Morning   HYDROcodone-acetaminophen (NORCO/VICODIN) 5-325 MG tablet Take 1 tablet by mouth every 6 (six) hours as needed for severe pain (pain score 7-10) or moderate pain (pain score 4-6). 60 tablet 0 Past Week   ipratropium (ATROVENT HFA) 17 MCG/ACT inhaler INHALE 2 PUFFS 4 TIMES DAILY 1 each 3 03/04/2024   levothyroxine (SYNTHROID) 100 MCG tablet TAKE 1 TABLET BY MOUTH EVERY DAY IN THE MORNING 90 tablet 3 03/04/2024   lisinopril (ZESTRIL) 10 MG tablet Take 1 tablet (10 mg total) by mouth daily. 90 tablet 3 03/03/2024 Morning   magnesium oxide (MAG-OX) 400 MG tablet Take 400 mg by mouth at bedtime.    Past Week   montelukast (SINGULAIR) 10 MG tablet TAKE 1 TABLET BY MOUTH EVERY DAY 90 tablet 3 Past Week   omeprazole (PRILOSEC) 40 MG capsule TAKE 1 CAPSULE BY MOUTH EVERY DAY 90 capsule 3 03/03/2024 Morning   polyethylene glycol powder (GLYCOLAX/MIRALAX) 17 GM/SCOOP powder Take 17 g by mouth every evening. 3350 g 3 03/03/2024   triamcinolone cream (KENALOG) 0.1 % Apply 1 application topically 2 (two) times daily as needed. For skin breakdown   Past Week   vitamin B-12 (CYANOCOBALAMIN) 1000 MCG tablet Take 1,000 mcg  by mouth daily.   03/03/2024 Morning   cyclobenzaprine (FLEXERIL) 5 MG tablet Take 1-2 tablets (5-10 mg total) by mouth at bedtime. 60 tablet 2    ezetimibe (ZETIA) 10 MG tablet Take 1 tablet (10 mg total) by mouth daily. (Patient not taking: Reported on 03/04/2024) 90 tablet 3 Not Taking   fenofibrate (TRICOR) 145 MG tablet Take 1 tablet (145 mg total) by mouth daily. (Patient not taking: Reported on 03/04/2024) 90 tablet 1 Not Taking   ipratropium-albuterol (DUONEB) 0.5-2.5 (3) MG/3ML SOLN TAKE 3 MLS BY NEBULIZATION EVERY 4 (FOUR) HOURS AS  NEEDED (SOB, WHEEZING). 540 mL 0    isometheptene-acetaminophen-dichloralphenazone (MIDRIN) 65-100-325 MG capsule Take 1 capsule by mouth 4 (four) times daily as needed for migraine. Maximum 5 capsules in 12 hours for migraine headaches, 8 capsules in 24 hours for tension headaches. (Patient not taking: Reported on 03/04/2024)   Not Taking   zolpidem (AMBIEN) 5 MG tablet Take 0.5-1 tablets (2.5-5 mg total) by mouth at bedtime as needed for sleep. 30 tablet 0      Allergies  Allergen Reactions   Advair Diskus [Fluticasone-Salmeterol] Other (See Comments)    Causes asthma attack    Codeine    Erythromycin Other (See Comments)    Stomach pain   Morphine And Codeine Itching and Nausea And Vomiting   Nsaids    Sulfa Antibiotics Other (See Comments)    unknown     Past Medical History:  Diagnosis Date   Allergic rhinitis    Anxiety    Asthma    Blood transfusion without reported diagnosis    Depression    Diverticulosis    Dyspnea    Esophagitis    GERD (gastroesophageal reflux disease)    Headache    Heart murmur    Hyperlipemia    Hypertension    Hypothyroidism    Obesity    Osteopenia    Palpitations    Pneumothorax    Sleep apnea     Review of systems:  Otherwise negative.    Physical Exam  Gen: Alert, oriented. Appears stated age.  HEENT: PERRLA. Lungs: No respiratory distress CV: RRR Abd: soft, benign, no masses Ext: No edema    Planned procedures: Proceed with EGD/colonoscopy. The patient understands the nature of the planned procedure, indications, risks, alternatives and potential complications including but not limited to bleeding, infection, perforation, damage to internal organs and possible oversedation/side effects from anesthesia. The patient agrees and gives consent to proceed.  Please refer to procedure notes for findings, recommendations and patient disposition/instructions.     Shane Darling, MD [facility]

## 2024-03-04 NOTE — Interval H&P Note (Signed)
 History and Physical Interval Note:  03/04/2024 8:33 AM  Bonnie West  has presented today for surgery, with the diagnosis of h/o TA Polyps alternating bowel habits GERD abnormal barium swallow.  The various methods of treatment have been discussed with the patient and family. After consideration of risks, benefits and other options for treatment, the patient has consented to  Procedure(s): COLONOSCOPY WITH PROPOFOL (N/A) ESOPHAGOGASTRODUODENOSCOPY (EGD) WITH PROPOFOL (N/A) as a surgical intervention.  The patient's history has been reviewed, patient examined, no change in status, stable for surgery.  I have reviewed the patient's chart and labs.  Questions were answered to the patient's satisfaction.     Shane Darling  Ok to proceed with EGD/Colonoscopy

## 2024-03-04 NOTE — Op Note (Signed)
 [facility] Patient Name: Bonnie West Procedure Date: 03/04/2024 8:33 AM MRN: [id number] Account #: [id number] Date of Birth: 12-11-1950 Admit Type: Outpatient Age: 73 Room: [facility] ENDO ROOM 3 Gender: Female Note Status: Finalized Instrument Name: Prentice Docker [id number] Procedure:             Colonoscopy Indications:           Surveillance: Personal history of adenomatous polyps                         on last colonoscopy > 5 years ago Providers:             Eather Colas MD, MD Referring MD:          Sallyanne Kuster (Referring MD) Medicines:             Monitored Anesthesia Care Complications:         No immediate complications. Procedure:             Pre-Anesthesia Assessment:                        - Prior to the procedure, a History and Physical was                         performed, and patient medications and allergies were                         reviewed. The patient is competent. The risks and                         benefits of the procedure and the sedation options and                         risks were discussed with the patient. All questions                         were answered and informed consent was obtained.                         Patient identification and proposed procedure were                         verified by the physician, the nurse, the                         anesthesiologist, the anesthetist and the technician                         in the endoscopy suite. Mental Status Examination:                         alert and oriented. Airway Examination: normal                         oropharyngeal airway and neck mobility. Respiratory                         Examination: clear to auscultation. CV Examination:  normal. Prophylactic Antibiotics: The patient does not                         require prophylactic antibiotics. Prior                         Anticoagulants: The patient has taken no  anticoagulant                         or antiplatelet agents. ASA Grade Assessment: III - A                         patient with severe systemic disease. After reviewing                         the risks and benefits, the patient was deemed in                         satisfactory condition to undergo the procedure. The                         anesthesia plan was to use monitored anesthesia care                         (MAC). Immediately prior to administration of                         medications, the patient was re-assessed for adequacy                         to receive sedatives. The heart rate, respiratory                         rate, oxygen saturations, blood pressure, adequacy of                         pulmonary ventilation, and response to care were                         monitored throughout the procedure. The physical                         status of the patient was re-assessed after the                         procedure.                        After obtaining informed consent, the colonoscope was                         passed under direct vision. Throughout the procedure,                         the patient's blood pressure, pulse, and oxygen                         saturations were monitored continuously. The  Colonoscope was introduced through the anus and                         advanced to the the cecum, identified by appendiceal                         orifice and ileocecal valve. The colonoscopy was                         performed without difficulty. The patient tolerated                         the procedure well. The quality of the bowel                         preparation was good. The ileocecal valve, appendiceal                         orifice, and rectum were photographed. Findings:      The perianal and digital rectal examinations were normal.      Internal hemorrhoids were found during retroflexion. The hemorrhoids       were Grade I  (internal hemorrhoids that do not prolapse).      The exam was otherwise without abnormality on direct and retroflexion       views. Impression:            - Internal hemorrhoids.                        - The examination was otherwise normal on direct and                         retroflexion views.                        - No specimens collected. Recommendation:        - Discharge patient to home.                        - Resume previous diet.                        - Continue present medications.                        - Repeat colonoscopy is not recommended due to current                         age (69 years or older) for surveillance.                        - Return to referring physician as previously                         scheduled. Procedure Code(s):     --- Professional ---                        W9604, Colorectal cancer screening; colonoscopy on  individual at high risk Diagnosis Code(s):     --- Professional ---                        Z86.010, Personal history of colonic polyps                        K64.0, First degree hemorrhoids CPT copyright 2022 American [employer] Association. All rights reserved. The codes documented in this report are preliminary and upon coder review may  be revised to meet current compliance requirements. Leida Puna MD, MD 03/04/2024 9:17:30 AM Number of Addenda: 0 Note Initiated On: 03/04/2024 8:33 AM Scope Withdrawal Time: 0 hours 8 minutes 4 seconds  Total Procedure Duration: 0 hours 15 minutes 46 seconds  Estimated Blood Loss:  Estimated blood loss: none.      [facility]

## 2024-03-04 NOTE — Op Note (Signed)
 [facility] Patient Name: Bonnie West Procedure Date: 03/04/2024 8:34 AM MRN: [id number] Account #: [id number] Date of Birth: Nov 12, 1951 Admit Type: Outpatient Age: 73 Room: [facility] ENDO ROOM 3 Gender: Female Note Status: Finalized Instrument Name: Upper Endoscope [id number] Procedure:             Upper GI endoscopy Indications:           Dysphagia, Gastro-esophageal reflux disease Providers:             Eather Colas MD, MD Referring MD:          Sallyanne Kuster (Referring MD) Medicines:             Monitored Anesthesia Care Complications:         No immediate complications. Estimated blood loss:                         Minimal. Procedure:             Pre-Anesthesia Assessment:                        - Prior to the procedure, a History and Physical was                         performed, and patient medications and allergies were                         reviewed. The patient is competent. The risks and                         benefits of the procedure and the sedation options and                         risks were discussed with the patient. All questions                         were answered and informed consent was obtained.                         Patient identification and proposed procedure were                         verified by the physician, the nurse, the                         anesthesiologist, the anesthetist and the technician                         in the endoscopy suite. Mental Status Examination:                         alert and oriented. Airway Examination: normal                         oropharyngeal airway and neck mobility. Respiratory                         Examination: clear to auscultation. CV Examination:  normal. Prophylactic Antibiotics: The patient does not                         require prophylactic antibiotics. Prior                         Anticoagulants: The patient has taken no  anticoagulant                         or antiplatelet agents. ASA Grade Assessment: III - A                         patient with severe systemic disease. After reviewing                         the risks and benefits, the patient was deemed in                         satisfactory condition to undergo the procedure. The                         anesthesia plan was to use monitored anesthesia care                         (MAC). Immediately prior to administration of                         medications, the patient was re-assessed for adequacy                         to receive sedatives. The heart rate, respiratory                         rate, oxygen saturations, blood pressure, adequacy of                         pulmonary ventilation, and response to care were                         monitored throughout the procedure. The physical                         status of the patient was re-assessed after the                         procedure.                        After obtaining informed consent, the endoscope was                         passed under direct vision. Throughout the procedure,                         the patient's blood pressure, pulse, and oxygen                         saturations were monitored continuously. The Endoscope  was introduced through the mouth, and advanced to the                         second part of duodenum. The upper GI endoscopy was                         accomplished without difficulty. The patient tolerated                         the procedure well. Findings:      The lower third of the esophagus was mildly tortuous.      A small hiatal hernia was present.      A single 3 mm sessile polyp with no bleeding and no stigmata of recent       bleeding was found in the gastric fundus. The polyp was removed with a       cold snare. Resection and retrieval were complete. Estimated blood loss       was minimal.      The exam of the stomach  was otherwise normal.      The examined duodenum was normal. Impression:            - Tortuous esophagus.                        - Small hiatal hernia.                        - A single gastric polyp. Resected and retrieved.                        - Normal examined duodenum. Recommendation:        - Discharge patient to home.                        - Resume previous diet.                        - Continue present medications.                        - Await pathology results.                        - Return to referring physician as previously                         scheduled. Procedure Code(s):     --- Professional ---                        [id number], Esophagogastroduodenoscopy, flexible,                         transoral; with removal of tumor(s), polyp(s), or                         other lesion(s) by snare technique Diagnosis Code(s):     --- Professional ---                        Q39.9, Congenital malformation of esophagus,  unspecified                        K44.9, Diaphragmatic hernia without obstruction or                         gangrene                        K31.7, Polyp of stomach and duodenum                        R13.10, Dysphagia, unspecified                        K21.9, Gastro-esophageal reflux disease without                         esophagitis CPT copyright 2022 American Medical Association. All rights reserved. The codes documented in this report are preliminary and upon coder review may  be revised to meet current compliance requirements. Leida Puna MD, MD 03/04/2024 9:13:23 AM Number of Addenda: 0 Note Initiated On: 03/04/2024 8:34 AM Estimated Blood Loss:  Estimated blood loss was minimal.      [facility]

## 2024-03-04 NOTE — Transfer of Care (Signed)
 Immediate Anesthesia Transfer of Care Note  Patient: Bonnie West  Procedure(s) Performed: COLONOSCOPY WITH PROPOFOL ESOPHAGOGASTRODUODENOSCOPY (EGD) WITH PROPOFOL  Patient Location: PACU  Anesthesia Type:General  Level of Consciousness: drowsy and patient cooperative  Airway & Oxygen Therapy: Patient Spontanous Breathing  Post-op Assessment: Report given to RN and Post -op Vital signs reviewed and stable  Post vital signs: stable  Last Vitals:  Vitals Value Taken Time  BP 105/57 03/04/24 0908  Temp 36.4 C 03/04/24 0907  Pulse 56 03/04/24 0910  Resp 18 03/04/24 0910  SpO2 97 % 03/04/24 0910  Vitals shown include unfiled device data.  Last Pain:  Vitals:   03/04/24 0907  TempSrc: Temporal  PainSc: Asleep         Complications: No notable events documented.

## 2024-03-05 ENCOUNTER — Encounter: Payer: Self-pay | Admitting: Gastroenterology

## 2024-03-05 LAB — SURGICAL PATHOLOGY

## 2024-03-07 ENCOUNTER — Ambulatory Visit: Admitting: Nurse Practitioner

## 2024-03-11 ENCOUNTER — Encounter: Payer: Self-pay | Admitting: Nurse Practitioner

## 2024-03-11 ENCOUNTER — Ambulatory Visit (INDEPENDENT_AMBULATORY_CARE_PROVIDER_SITE_OTHER): Admitting: Nurse Practitioner

## 2024-03-11 VITALS — BP 136/78 | HR 73 | Temp 98.2°F | Resp 16 | Ht 61.0 in | Wt 144.6 lb

## 2024-03-11 DIAGNOSIS — I700 Atherosclerosis of aorta: Secondary | ICD-10-CM

## 2024-03-11 DIAGNOSIS — I2510 Atherosclerotic heart disease of native coronary artery without angina pectoris: Secondary | ICD-10-CM | POA: Diagnosis not present

## 2024-03-11 DIAGNOSIS — N813 Complete uterovaginal prolapse: Secondary | ICD-10-CM | POA: Diagnosis not present

## 2024-03-11 DIAGNOSIS — G4709 Other insomnia: Secondary | ICD-10-CM | POA: Diagnosis not present

## 2024-03-11 DIAGNOSIS — E039 Hypothyroidism, unspecified: Secondary | ICD-10-CM | POA: Diagnosis not present

## 2024-03-11 DIAGNOSIS — J4540 Moderate persistent asthma, uncomplicated: Secondary | ICD-10-CM | POA: Diagnosis not present

## 2024-03-11 DIAGNOSIS — F411 Generalized anxiety disorder: Secondary | ICD-10-CM | POA: Diagnosis not present

## 2024-03-11 DIAGNOSIS — J849 Interstitial pulmonary disease, unspecified: Secondary | ICD-10-CM

## 2024-03-11 DIAGNOSIS — I10 Essential (primary) hypertension: Secondary | ICD-10-CM

## 2024-03-11 DIAGNOSIS — Z79899 Other long term (current) drug therapy: Secondary | ICD-10-CM

## 2024-03-11 MED ORDER — HYDROCODONE-ACETAMINOPHEN 5-325 MG PO TABS: 1.0000 | ORAL_TABLET | Freq: Four times a day (QID) | ORAL | 0 refills | Status: DC | PRN

## 2024-03-11 MED ORDER — ZOLPIDEM TARTRATE 5 MG PO TABS: 2.5000 mg | ORAL_TABLET | Freq: Every evening | ORAL | 0 refills | Status: AC | PRN

## 2024-03-11 NOTE — Progress Notes (Signed)
 [facility] [address] [place], [place] [address]  Internal MEDICINE  Office Visit Note  Patient Name: Bonnie West  [id number]  [id number]  Date of Service: 03/11/2024  Chief Complaint  Patient presents with   Depression   Gastroesophageal Reflux   Hyperlipidemia   Hypertension   Follow-up    HPI Philomina presents for a follow-up visit for ILD, CAD, insomnia, and chronic back pain.  Needs exception done for flovent  inhaler. New insurance, needs to bring new card Still needs to schedule with OBGYN for pelvic organ prolapse Chronic interstitial lung disease with ground-glass appearance of both lungs.  Coronary calcium score of 62.2, minimal RCA stenosis <25%, CAD-RADS 1 minimal nonobstructive CAD-- recommend preventive therapy and risk factor modification.  Insomnia --uses ambien  to help her sleep which is effective, due for refills.  Chronic back pain   Current Medication: Outpatient Encounter Medications as of 03/11/2024  Medication Sig Note   acetaminophen  (TYLENOL ) 500 MG tablet Take 1,000 mg by mouth 2 (two) times daily as needed for mild pain or moderate pain.    albuterol  (VENTOLIN  HFA) 108 (90 Base) MCG/ACT inhaler Inhale 1-2 puffs into the lungs every 4 (four) hours as needed for wheezing or shortness of breath.    ALPRAZolam  (XANAX ) 0.25 MG tablet Take 1 tablet (0.25 mg total) by mouth 2 (two) times daily as needed for anxiety.    bisacodyl  (DULCOLAX) 5 MG EC tablet Take 5-10 mg by mouth at bedtime as needed for moderate constipation. Two at night    cetirizine  (ZYRTEC ) 10 MG tablet Take 1 tablet (10 mg total) by mouth daily.    cholecalciferol (VITAMIN D ) 1000 units tablet Take 1,000 Units by mouth at bedtime.    clindamycin  (CLEOCIN  T) 1 % external solution Apply 1 Application topically 2 (two) times daily as needed. For skin breakdown    cyclobenzaprine  (FLEXERIL ) 5 MG tablet Take 1-2 tablets (5-10 mg total) by mouth at bedtime. 04/18/2023: Takes as needed    docusate sodium  (COLACE) 100 MG capsule Take 100-200 mg by mouth at bedtime as needed for mild constipation or moderate constipation.     estradiol  (ESTRACE ) 0.1 MG/GM vaginal cream INSERT 1 GRAM VAGINALLY TWICE WEEKLY AT BEDTIME    ezetimibe  (ZETIA ) 10 MG tablet Take 1 tablet (10 mg total) by mouth daily.    fenofibrate  (TRICOR ) 145 MG tablet Take 1 tablet (145 mg total) by mouth daily.    FLUoxetine  (PROZAC ) 40 MG capsule Take 1 capsule (40 mg total) by mouth daily.    fluticasone  (FLOVENT  HFA) 110 MCG/ACT inhaler USE 2 PUFFS INHALED TWICE DAILY    ipratropium (ATROVENT  HFA) 17 MCG/ACT inhaler INHALE 2 PUFFS 4 TIMES DAILY    isometheptene-acetaminophen -dichloralphenazone (MIDRIN) 65-100-325 MG capsule Take 1 capsule by mouth 4 (four) times daily as needed for migraine. Maximum 5 capsules in 12 hours for migraine headaches, 8 capsules in 24 hours for tension headaches.    levothyroxine  (SYNTHROID ) 100 MCG tablet TAKE 1 TABLET BY MOUTH EVERY DAY IN THE MORNING    lisinopril  (ZESTRIL ) 10 MG tablet Take 1 tablet (10 mg total) by mouth daily.    magnesium  oxide (MAG-OX) 400 MG tablet Take 400 mg by mouth at bedtime.     montelukast  (SINGULAIR ) 10 MG tablet TAKE 1 TABLET BY MOUTH EVERY DAY    omeprazole  (PRILOSEC) 40 MG capsule TAKE 1 CAPSULE BY MOUTH EVERY DAY    polyethylene glycol powder (GLYCOLAX /MIRALAX ) 17 GM/SCOOP powder Take 17 g by mouth every evening.  triamcinolone  cream (KENALOG ) 0.1 % Apply 1 application topically 2 (two) times daily as needed. For skin breakdown    vitamin B-12 (CYANOCOBALAMIN) 1000 MCG tablet Take 1,000 mcg by mouth daily.    [DISCONTINUED] HYDROcodone -acetaminophen  (NORCO/VICODIN) 5-325 MG tablet Take 1 tablet by mouth every 6 (six) hours as needed for severe pain (pain score 7-10) or moderate pain (pain score 4-6).    [DISCONTINUED] zolpidem  (AMBIEN ) 5 MG tablet Take 0.5-1 tablets (2.5-5 mg total) by mouth at bedtime as needed for sleep.     HYDROcodone -acetaminophen  (NORCO/VICODIN) 5-325 MG tablet Take 1 tablet by mouth every 6 (six) hours as needed for severe pain (pain score 7-10) or moderate pain (pain score 4-6).    ipratropium-albuterol  (DUONEB) 0.5-2.5 (3) MG/3ML SOLN TAKE 3 MLS BY NEBULIZATION EVERY 4 (FOUR) HOURS AS NEEDED (SOB, WHEEZING).    zolpidem  (AMBIEN ) 5 MG tablet Take 0.5-1 tablets (2.5-5 mg total) by mouth at bedtime as needed for sleep.    No facility-administered encounter medications on file as of 03/11/2024.    Surgical History: Past Surgical History:  Procedure Laterality Date   BUNIONECTOMY     CATARACT EXTRACTION W/PHACO Right 10/11/2016   Procedure: CATARACT EXTRACTION PHACO AND INTRAOCULAR LENS PLACEMENT (IOC);  Surgeon: Clair Crews, MD;  Location: [facility] ORS;  Service: Ophthalmology;  Laterality: Right;  Lot# [id number] H US : 00:37.7 AP%: 18.1 CDE: 6.80   CATARACT EXTRACTION W/PHACO Left 11/08/2016   Procedure: CATARACT EXTRACTION PHACO AND INTRAOCULAR LENS PLACEMENT (IOC);  Surgeon: Clair Crews, MD;  Location: [facility] ORS;  Service: Ophthalmology;  Laterality: Left;  PACK LOT: [id number] H US :00:32 AP:44 CDE:6.46   COLONOSCOPY     COLONOSCOPY WITH PROPOFOL  N/A 11/26/2015   Procedure: COLONOSCOPY WITH PROPOFOL ;  Surgeon: Cassie Click, MD;  Location: [facility] ENDOSCOPY;  Service: Endoscopy;  Laterality: N/A;   COLONOSCOPY WITH PROPOFOL  N/A 03/04/2024   Procedure: COLONOSCOPY WITH PROPOFOL ;  Surgeon: Shane Darling, MD;  Location: [facility] ENDOSCOPY;  Service: Endoscopy;  Laterality: N/A;   ESOPHAGOGASTRODUODENOSCOPY (EGD) WITH PROPOFOL  N/A 03/04/2024   Procedure: ESOPHAGOGASTRODUODENOSCOPY (EGD) WITH PROPOFOL ;  Surgeon: Shane Darling, MD;  Location: [facility] ENDOSCOPY;  Service: Endoscopy;  Laterality: N/A;   EYE SURGERY  07/23/2021   EYE SURGERY Left 09/24/2021   strabusmis, double vision   TONSILLECTOMY      Medical History: Past Medical History:  Diagnosis Date   Allergic rhinitis     Anxiety    Asthma    Blood transfusion without reported diagnosis    Depression    Diverticulosis    Dyspnea    Esophagitis    GERD (gastroesophageal reflux disease)    Headache    Heart murmur    Hyperlipemia    Hypertension    Hypothyroidism    Obesity    Osteopenia    Palpitations    Pneumothorax    Sleep apnea     Family History: Family History  Problem Relation Age of Onset   Breast cancer Maternal Grandmother    Stroke Maternal Grandmother    Bladder Cancer Father    Prostate cancer Father    Heart attack Father    Aortic aneurysm Father    Congestive Heart Failure Father    Colon cancer Paternal Grandmother    Aneurysm Paternal Grandmother    Stroke Paternal Grandmother    Congestive Heart Failure Paternal Grandmother    Dementia Mother    Stroke Mother    Dementia Maternal Grandfather    Stroke Maternal Grandfather    Dementia Paternal Grandfather  Stroke Paternal Grandfather     Social History   Socioeconomic History   Marital status: Married    Spouse name: Not on file   Number of children: Not on file   Years of education: Not on file   Highest education level: Not on file  Occupational History   Not on file  Tobacco Use   Smoking status: Former   Smokeless tobacco: Never  Vaping Use   Vaping status: Never Used  Substance and Sexual Activity   Alcohol use: No   Drug use: No   Sexual activity: Not Currently    Partners: Male    Birth control/protection: Post-menopausal  Other Topics Concern   Not on file  Social History Narrative   Not on file   Social Drivers of Health   Financial Resource Strain: Low Risk  (03/23/2021)   Overall Financial Resource Strain (CARDIA)    Difficulty of Paying Living Expenses: Not very hard  Food Insecurity: No Food Insecurity (04/19/2023)   Hunger Vital Sign    Worried About Running Out of Food in the Last Year: Never true    Ran Out of Food in the Last Year: Never true  Transportation Needs: No  Transportation Needs (04/19/2023)   PRAPARE - Administrator, Civil Service (Medical): No    Lack of Transportation (Non-Medical): No  Physical Activity: Not on file  Stress: Not on file  Social Connections: Not on file  Intimate Partner Violence: Not At Risk (04/19/2023)   Humiliation, Afraid, Rape, and Kick questionnaire    Fear of Current or Ex-Partner: No    Emotionally Abused: No    Physically Abused: No    Sexually Abused: No      Review of Systems  Constitutional:  Negative for chills, fatigue and unexpected weight change.  HENT:  Negative for congestion, rhinorrhea, sneezing and sore throat.   Eyes:  Negative for redness.  Respiratory:  Negative for cough, chest tightness and shortness of breath.   Cardiovascular: Negative.  Negative for chest pain and palpitations.  Gastrointestinal: Negative.  Negative for abdominal pain, constipation, diarrhea, nausea and vomiting.  Genitourinary:  Negative for dysuria and frequency.  Musculoskeletal:  Positive for arthralgias and back pain. Negative for joint swelling and neck pain.  Skin:  Negative for rash.  Neurological: Negative.  Negative for tremors and numbness.  Hematological:  Negative for adenopathy. Does not bruise/bleed easily.  Psychiatric/Behavioral:  Positive for sleep disturbance (improved with ambien ). Negative for behavioral problems (Depression), self-injury and suicidal ideas. The patient is nervous/anxious (stable with medications).     Vital Signs: BP 136/78   Pulse 73   Temp 98.2 F (36.8 C)   Resp 16   Ht 5\' 1"  (1.549 m)   Wt 144 lb 9.6 oz (65.6 kg)   SpO2 98%   BMI 27.32 kg/m    Physical Exam Vitals reviewed.  Constitutional:      General: She is not in acute distress.    Appearance: Normal appearance. She is normal weight. She is not ill-appearing.  HENT:     Head: Normocephalic and atraumatic.  Eyes:     Extraocular Movements: Extraocular movements intact.     Pupils: Pupils are  equal, round, and reactive to light.  Cardiovascular:     Rate and Rhythm: Normal rate and regular rhythm.  Pulmonary:     Effort: Pulmonary effort is normal. No respiratory distress.  Neurological:     Mental Status: She is alert and oriented to  person, place, and time.     Cranial Nerves: No cranial nerve deficit.     Coordination: Coordination normal.     Gait: Gait normal.  Psychiatric:        Mood and Affect: Mood normal.        Behavior: Behavior normal.        Assessment/Plan: 1. Essential hypertension, benign (Primary) Stable, continue lisinopril  as prescribed.   2. Aortic atherosclerosis (HCC) Results of CT coronary imaging discussed. Continue fenofibrate  and ezetimibe  as prescribed. She is not currently on a statin and there is no current records of her previously trying a statin.   3. Coronary artery disease involving native coronary artery of native heart without angina pectoris Discussed CT coronary results. Continue cholesterol lowering medications, maintain control of blood pressure, eating a low salt, low fat, low cholesterol diet, ensure adequate hydration, limit red meat intake, increase lean proteins in diet, increase physical activity as tolerated.   4. Chronic interstitial lung disease (HCC) Discussed results showing interstitial lung disease. Continue inhalers as prescribed.   5. Moderate persistent asthma without status asthmaticus without complication Continue using inhalers as prescribed. Requesting a coverage exception to be submitted to her insurance for flovent   6. Acquired hypothyroidism Continue levothyroxine  as prescribed.   7. Complete uterovaginal prolapse Reminded patient to call the OBGYN she was referred to and phone number was given to the patient again.   8. Other insomnia Patient takes ambien  as needed for sleep. Refill sent to pharmacy. Will assess for more refills at next visit during the summer.  - zolpidem  (AMBIEN ) 5 MG tablet;  Take 0.5-1 tablets (2.5-5 mg total) by mouth at bedtime as needed for sleep.  Dispense: 30 tablet; Refill: 0  9. Encounter for long-term (current) use of medications Patient has chronic pain, 1 hydrocodone  refill ordered for the patient, will follow up in the summer for further refills.  - HYDROcodone -acetaminophen  (NORCO/VICODIN) 5-325 MG tablet; Take 1 tablet by mouth every 6 (six) hours as needed for severe pain (pain score 7-10) or moderate pain (pain score 4-6).  Dispense: 60 tablet; Refill: 0  10. GAD (generalized anxiety disorder) Continue alprazolam  as needed as prescribed. Has follow up visit in the summer for refills.    General Counseling: rache klimaszewski understanding of the findings of todays visit and agrees with plan of treatment. I have discussed any further diagnostic evaluation that may be needed or ordered today. We also reviewed her medications today. she has been encouraged to call the office with any questions or concerns that should arise related to todays visit.    No orders of the defined types were placed in this encounter.   Meds ordered this encounter  Medications   zolpidem  (AMBIEN ) 5 MG tablet    Sig: Take 0.5-1 tablets (2.5-5 mg total) by mouth at bedtime as needed for sleep.    Dispense:  30 tablet    Refill:  0    Do not fill now, patient will call pharmacy when she needs it.   HYDROcodone -acetaminophen  (NORCO/VICODIN) 5-325 MG tablet    Sig: Take 1 tablet by mouth every 6 (six) hours as needed for severe pain (pain score 7-10) or moderate pain (pain score 4-6).    Dispense:  60 tablet    Refill:  0    Refill for april    Return for previously scheduled, F/U, anxiety med refill, Anna-Marie Coller PCP in june .   Total time spent:30 Minutes Time spent includes review of chart, medications, test  results, and follow up plan with the patient.   [place] Controlled Substance Database was reviewed by me.  This patient was seen by Laurence Pons, FNP-C in  collaboration with Dr. Verneta Gone as a part of collaborative care agreement.   Anneli Bing R. Bobbi Burow, MSN, FNP-C Internal medicine

## 2024-03-12 ENCOUNTER — Telehealth: Payer: Self-pay | Admitting: Nurse Practitioner

## 2024-03-12 NOTE — Telephone Encounter (Signed)
 Patient called yesterday stating [facility] sent her referral to [facility]. She told me to send fax to same GYN that Alysssa sees. I tried to explain to her her that is where I sent her referral, and they may have sent referral to [facility] due to diagnosis. Patient kept repeating that I need to send referral to same GYN Alyssa sees and that I need to discuss with Alyssa. I s/w Alyssa this morning, told her what was said.I then s/w Archie Bearded w/ [facility] in [place]. They had told patient that they cannot get her in until August and that they can send referral to [facility] if patient wanted. Archie Bearded said she will call patient today and explain-[name]

## 2024-04-12 ENCOUNTER — Encounter: Payer: Self-pay | Admitting: Nurse Practitioner

## 2024-05-15 ENCOUNTER — Ambulatory Visit (INDEPENDENT_AMBULATORY_CARE_PROVIDER_SITE_OTHER): Admitting: Nurse Practitioner

## 2024-05-15 ENCOUNTER — Encounter: Payer: Self-pay | Admitting: Nurse Practitioner

## 2024-05-15 VITALS — BP 136/71 | HR 85 | Temp 97.8°F | Resp 16 | Ht 61.0 in | Wt 144.6 lb

## 2024-05-15 DIAGNOSIS — L309 Dermatitis, unspecified: Secondary | ICD-10-CM

## 2024-05-15 DIAGNOSIS — J4540 Moderate persistent asthma, uncomplicated: Secondary | ICD-10-CM | POA: Diagnosis not present

## 2024-05-15 DIAGNOSIS — Z79899 Other long term (current) drug therapy: Secondary | ICD-10-CM | POA: Diagnosis not present

## 2024-05-15 DIAGNOSIS — F411 Generalized anxiety disorder: Secondary | ICD-10-CM | POA: Diagnosis not present

## 2024-05-15 DIAGNOSIS — N813 Complete uterovaginal prolapse: Secondary | ICD-10-CM

## 2024-05-15 MED ORDER — ALPRAZOLAM 0.25 MG PO TABS: 0.2500 mg | ORAL_TABLET | Freq: Two times a day (BID) | ORAL | 2 refills | Status: DC | PRN

## 2024-05-15 MED ORDER — CLINDAMYCIN PHOSPHATE 1 % EX SOLN: 1.0000 | Freq: Two times a day (BID) | CUTANEOUS | 5 refills | Status: DC | PRN

## 2024-05-15 MED ORDER — HYDROCODONE-ACETAMINOPHEN 5-325 MG PO TABS: 1.0000 | ORAL_TABLET | Freq: Four times a day (QID) | ORAL | 0 refills | Status: DC | PRN

## 2024-05-15 NOTE — Progress Notes (Signed)
 [facility] [address] [place], [place] [address]  Internal MEDICINE  Office Visit Note  Patient Name: Bonnie West  [id number]  [id number]  Date of Service: 05/15/2024  Chief Complaint  Patient presents with   Follow-up   Depression   Hyperlipidemia   Hypertension   Gastroesophageal Reflux   Medication Refill   Quality Metric Gaps    Mammogram    HPI Bonnie West presents for a follow-up visit for asthma, anxiety, pelvic organ prolapse and refills.  Flovent  inhaler -- not covered by insurance, wants an exception for coverage. Not willing to try other inhalers. Takes inhaler for asthma Anxiety -- takes alprazolam  as needed  Prolapsed bladder -- needs to see urogynecology.     Current Medication: Outpatient Encounter Medications as of 05/15/2024  Medication Sig Note   acetaminophen  (TYLENOL ) 500 MG tablet Take 1,000 mg by mouth 2 (two) times daily as needed for mild pain or moderate pain.    albuterol  (VENTOLIN  HFA) 108 (90 Base) MCG/ACT inhaler Inhale 1-2 puffs into the lungs every 4 (four) hours as needed for wheezing or shortness of breath.    bisacodyl  (DULCOLAX) 5 MG EC tablet Take 5-10 mg by mouth at bedtime as needed for moderate constipation. Two at night    cetirizine  (ZYRTEC ) 10 MG tablet Take 1 tablet (10 mg total) by mouth daily.    cholecalciferol (VITAMIN D ) 1000 units tablet Take 1,000 Units by mouth at bedtime.    cyclobenzaprine  (FLEXERIL ) 5 MG tablet Take 1-2 tablets (5-10 mg total) by mouth at bedtime. 04/18/2023: Takes as needed   docusate sodium  (COLACE) 100 MG capsule Take 100-200 mg by mouth at bedtime as needed for mild constipation or moderate constipation.     estradiol  (ESTRACE ) 0.1 MG/GM vaginal cream INSERT 1 GRAM VAGINALLY TWICE WEEKLY AT BEDTIME    ezetimibe  (ZETIA ) 10 MG tablet Take 1 tablet (10 mg total) by mouth daily.    fenofibrate  (TRICOR ) 145 MG tablet Take 1 tablet (145 mg total) by mouth daily.    FLUoxetine  (PROZAC ) 40 MG  capsule Take 1 capsule (40 mg total) by mouth daily.    fluticasone  (FLOVENT  HFA) 110 MCG/ACT inhaler USE 2 PUFFS INHALED TWICE DAILY    ipratropium (ATROVENT  HFA) 17 MCG/ACT inhaler INHALE 2 PUFFS 4 TIMES DAILY    ipratropium-albuterol  (DUONEB) 0.5-2.5 (3) MG/3ML SOLN TAKE 3 MLS BY NEBULIZATION EVERY 4 (FOUR) HOURS AS NEEDED (SOB, WHEEZING).    isometheptene-acetaminophen -dichloralphenazone (MIDRIN) 65-100-325 MG capsule Take 1 capsule by mouth 4 (four) times daily as needed for migraine. Maximum 5 capsules in 12 hours for migraine headaches, 8 capsules in 24 hours for tension headaches.    levothyroxine  (SYNTHROID ) 100 MCG tablet TAKE 1 TABLET BY MOUTH EVERY DAY IN THE MORNING    lisinopril  (ZESTRIL ) 10 MG tablet Take 1 tablet (10 mg total) by mouth daily.    magnesium  oxide (MAG-OX) 400 MG tablet Take 400 mg by mouth at bedtime.     montelukast  (SINGULAIR ) 10 MG tablet TAKE 1 TABLET BY MOUTH EVERY DAY    omeprazole  (PRILOSEC) 40 MG capsule TAKE 1 CAPSULE BY MOUTH EVERY DAY    polyethylene glycol powder (GLYCOLAX /MIRALAX ) 17 GM/SCOOP powder Take 17 g by mouth every evening.    triamcinolone  cream (KENALOG ) 0.1 % Apply 1 application topically 2 (two) times daily as needed. For skin breakdown    vitamin B-12 (CYANOCOBALAMIN) 1000 MCG tablet Take 1,000 mcg by mouth daily.    zolpidem  (AMBIEN ) 5 MG tablet Take 0.5-1 tablets (  2.5-5 mg total) by mouth at bedtime as needed for sleep.    [DISCONTINUED] ALPRAZolam  (XANAX ) 0.25 MG tablet Take 1 tablet (0.25 mg total) by mouth 2 (two) times daily as needed for anxiety.    [DISCONTINUED] clindamycin  (CLEOCIN  T) 1 % external solution Apply 1 Application topically 2 (two) times daily as needed. For skin breakdown    [DISCONTINUED] HYDROcodone -acetaminophen  (NORCO/VICODIN) 5-325 MG tablet Take 1 tablet by mouth every 6 (six) hours as needed for severe pain (pain score 7-10) or moderate pain (pain score 4-6).    ALPRAZolam  (XANAX ) 0.25 MG tablet Take 1 tablet  (0.25 mg total) by mouth 2 (two) times daily as needed for anxiety.    clindamycin  (CLEOCIN  T) 1 % external solution Apply 1 Application topically 2 (two) times daily as needed. For skin breakdown    HYDROcodone -acetaminophen  (NORCO/VICODIN) 5-325 MG tablet Take 1 tablet by mouth every 6 (six) hours as needed for severe pain (pain score 7-10) or moderate pain (pain score 4-6).    No facility-administered encounter medications on file as of 05/15/2024.    Surgical History: Past Surgical History:  Procedure Laterality Date   BUNIONECTOMY     CATARACT EXTRACTION W/PHACO Right 10/11/2016   Procedure: CATARACT EXTRACTION PHACO AND INTRAOCULAR LENS PLACEMENT (IOC);  Surgeon: Elsie Carmine, MD;  Location: [facility] ORS;  Service: Ophthalmology;  Laterality: Right;  Lot# [id number] H US : 00:37.7 AP%: 18.1 CDE: 6.80   CATARACT EXTRACTION W/PHACO Left 11/08/2016   Procedure: CATARACT EXTRACTION PHACO AND INTRAOCULAR LENS PLACEMENT (IOC);  Surgeon: Elsie Carmine, MD;  Location: [facility] ORS;  Service: Ophthalmology;  Laterality: Left;  PACK LOT: [id number] H US :00:32 AP:44 CDE:6.46   COLONOSCOPY     COLONOSCOPY WITH PROPOFOL  N/A 11/26/2015   Procedure: COLONOSCOPY WITH PROPOFOL ;  Surgeon: Lamar ONEIDA Holmes, MD;  Location: [facility] END[facility]COPY;  Service: Endoscopy;  Laterality: N/A;   COLONOSCOPY WITH PROPOFOL  N/A 03/04/2024   Procedure: COLONOSCOPY WITH PROPOFOL ;  Surgeon: Maryruth Ole ONEIDA, MD;  Location: [facility] ENDOSCOPY;  Service: Endoscopy;  Laterality: N/A;   ESOPHAGOGASTRODUODENOSCOPY (EGD) WITH PROPOFOL  N/A 03/04/2024   Procedure: ESOPHAGOGASTRODUODENOSCOPY (EGD) WITH PROPOFOL ;  Surgeon: Maryruth Ole ONEIDA, MD;  Location: [facility] ENDOSCOPY;  Service: Endoscopy;  Laterality: N/A;   EYE SURGERY  07/23/2021   EYE SURGERY Left 09/24/2021   strabusmis, double vision   TONSILLECTOMY      Medical History: Past Medical History:  Diagnosis Date   Allergic rhinitis    Anxiety    Asthma    Blood transfusion  without reported diagnosis    Depression    Diverticulosis    Dyspnea    Esophagitis    GERD (gastroesophageal reflux disease)    Headache    Heart murmur    Hyperlipemia    Hypertension    Hypothyroidism    Obesity    Osteopenia    Palpitations    Pneumothorax    Sleep apnea     Family History: Family History  Problem Relation Age of Onset   Breast cancer Maternal Grandmother    Stroke Maternal Grandmother    Bladder Cancer Father    Prostate cancer Father    Heart attack Father    Aortic aneurysm Father    Congestive Heart Failure Father    Colon cancer Paternal Grandmother    Aneurysm Paternal Grandmother    Stroke Paternal Grandmother    Congestive Heart Failure Paternal Grandmother    Dementia Mother    Stroke Mother    Dementia Maternal Grandfather    Stroke Maternal  Grandfather    Dementia Paternal Grandfather    Stroke Paternal Grandfather     Social History   Socioeconomic History   Marital status: Married    Spouse name: Not on file   Number of children: Not on file   Years of education: Not on file   Highest education level: Not on file  Occupational History   Not on file  Tobacco Use   Smoking status: Former   Smokeless tobacco: Never  Vaping Use   Vaping status: Never Used  Substance and Sexual Activity   Alcohol use: No   Drug use: No   Sexual activity: Not Currently    Partners: Male    Birth control/protection: Post-menopausal  Other Topics Concern   Not on file  Social History Narrative   Not on file   Social Drivers of Health   Financial Resource Strain: Low Risk  (03/23/2021)   Overall Financial Resource Strain (CARDIA)    Difficulty of Paying Living Expenses: Not very hard  Food Insecurity: No Food Insecurity (04/19/2023)   Hunger Vital Sign    Worried About Running Out of Food in the Last Year: Never true    Ran Out of Food in the Last Year: Never true  Transportation Needs: No Transportation Needs (04/19/2023)   PRAPARE -  Administrator, Civil Service (Medical): No    Lack of Transportation (Non-Medical): No  Physical Activity: Not on file  Stress: Not on file  Social Connections: Not on file  Intimate Partner Violence: Not At Risk (04/19/2023)   Humiliation, Afraid, Rape, and Kick questionnaire    Fear of Current or Ex-Partner: No    Emotionally Abused: No    Physically Abused: No    Sexually Abused: No      Review of Systems  Constitutional:  Negative for chills, fatigue and unexpected weight change.  HENT:  Negative for congestion, rhinorrhea, sneezing and sore throat.   Eyes:  Negative for redness.  Respiratory:  Negative for cough, chest tightness and shortness of breath.   Cardiovascular: Negative.  Negative for chest pain and palpitations.  Gastrointestinal: Negative.  Negative for abdominal pain, constipation, diarrhea, nausea and vomiting.  Genitourinary:  Negative for dysuria and frequency.  Musculoskeletal:  Positive for arthralgias and back pain. Negative for joint swelling and neck pain.  Skin:  Negative for rash.  Neurological: Negative.  Negative for tremors and numbness.  Hematological:  Negative for adenopathy. Does not bruise/bleed easily.  Psychiatric/Behavioral:  Positive for sleep disturbance (improved with ambien ). Negative for behavioral problems (Depression), self-injury and suicidal ideas. The patient is nervous/anxious (stable with medications).     Vital Signs: BP 136/71   Pulse 85   Temp 97.8 F (36.6 C)   Resp 16   Ht 5' 1 (1.549 m)   Wt 144 lb 9.6 oz (65.6 kg)   SpO2 96%   BMI 27.32 kg/m    Physical Exam Vitals reviewed.  Constitutional:      General: She is not in acute distress.    Appearance: Normal appearance. She is normal weight. She is not ill-appearing.  HENT:     Head: Normocephalic and atraumatic.  Eyes:     Extraocular Movements: Extraocular movements intact.     Pupils: Pupils are equal, round, and reactive to light.   Cardiovascular:     Rate and Rhythm: Normal rate and regular rhythm.  Pulmonary:     Effort: Pulmonary effort is normal. No respiratory distress.  Neurological:  Mental Status: She is alert and oriented to person, place, and time.     Cranial Nerves: No cranial nerve deficit.     Coordination: Coordination normal.     Gait: Gait normal.  Psychiatric:        Mood and Affect: Mood normal.        Behavior: Behavior normal.        Assessment/Plan: 1. Moderate persistent asthma without status asthmaticus without complication (Primary) Continue inhaler use as prescribed. Will look into coverage exception further.   2. Complete uterovaginal prolapse Referred to urogynecology  - Ambulatory referral to Urogynecology  3. Dermatitis Continue topical clindamycin  as needed.  - clindamycin  (CLEOCIN  T) 1 % external solution; Apply 1 Application topically 2 (two) times daily as needed. For skin breakdown  Dispense: 30 mL; Refill: 5  4. Encounter for long-term (current) use of medications Continue prn hydrocodone  as prescribed.  - HYDROcodone -acetaminophen  (NORCO/VICODIN) 5-325 MG tablet; Take 1 tablet by mouth every 6 (six) hours as needed for severe pain (pain score 7-10) or moderate pain (pain score 4-6).  Dispense: 60 tablet; Refill: 0  5. GAD (generalized anxiety disorder) Continue alprazolam  as prescribed.  - ALPRAZolam  (XANAX ) 0.25 MG tablet; Take 1 tablet (0.25 mg total) by mouth 2 (two) times daily as needed for anxiety.  Dispense: 60 tablet; Refill: 2   General Counseling: Zuzanna verbalizes understanding of the findings of todays visit and agrees with plan of treatment. I have discussed any further diagnostic evaluation that may be needed or ordered today. We also reviewed her medications today. she has been encouraged to call the office with any questions or concerns that should arise related to todays visit.    No orders of the defined types were placed in this  encounter.   Meds ordered this encounter  Medications   clindamycin  (CLEOCIN  T) 1 % external solution    Sig: Apply 1 Application topically 2 (two) times daily as needed. For skin breakdown    Dispense:  30 mL    Refill:  5   ALPRAZolam  (XANAX ) 0.25 MG tablet    Sig: Take 1 tablet (0.25 mg total) by mouth 2 (two) times daily as needed for anxiety.    Dispense:  60 tablet    Refill:  2    F41.0 dx code   HYDROcodone -acetaminophen  (NORCO/VICODIN) 5-325 MG tablet    Sig: Take 1 tablet by mouth every 6 (six) hours as needed for severe pain (pain score 7-10) or moderate pain (pain score 4-6).    Dispense:  60 tablet    Refill:  0    Refill for april    Return in about 3 months (around 08/08/2024) for F/U, anxiety med refill, pain med refill, Arn Mcomber PCP.   Total time spent:30 Minutes Time spent includes review of chart, medications, test results, and follow up plan with the patient.   [place] Controlled Substance Database was reviewed by me.  This patient was seen by Mardy Maxin, FNP-C in collaboration with Dr. Sigrid Bathe as a part of collaborative care agreement.   Racquel Arkin R. Maxin, MSN, FNP-C Internal medicine

## 2024-05-26 ENCOUNTER — Encounter: Payer: Self-pay | Admitting: Nurse Practitioner

## 2024-06-15 ENCOUNTER — Other Ambulatory Visit: Payer: Self-pay | Admitting: Internal Medicine

## 2024-06-15 DIAGNOSIS — J4540 Moderate persistent asthma, uncomplicated: Secondary | ICD-10-CM

## 2024-06-24 ENCOUNTER — Other Ambulatory Visit: Payer: Self-pay | Admitting: Nurse Practitioner

## 2024-06-24 DIAGNOSIS — K219 Gastro-esophageal reflux disease without esophagitis: Secondary | ICD-10-CM

## 2024-07-18 ENCOUNTER — Other Ambulatory Visit: Payer: Self-pay | Admitting: Nurse Practitioner

## 2024-07-18 ENCOUNTER — Telehealth (INDEPENDENT_AMBULATORY_CARE_PROVIDER_SITE_OTHER): Admitting: Nurse Practitioner

## 2024-07-18 ENCOUNTER — Encounter: Payer: Self-pay | Admitting: Nurse Practitioner

## 2024-07-18 VITALS — Resp 16 | Ht 61.0 in | Wt 144.0 lb

## 2024-07-18 DIAGNOSIS — J4540 Moderate persistent asthma, uncomplicated: Secondary | ICD-10-CM

## 2024-07-18 DIAGNOSIS — R062 Wheezing: Secondary | ICD-10-CM

## 2024-07-18 DIAGNOSIS — J0110 Acute frontal sinusitis, unspecified: Secondary | ICD-10-CM | POA: Diagnosis not present

## 2024-07-18 MED ORDER — PREDNISONE 10 MG PO TABS: ORAL_TABLET | ORAL | 0 refills | Status: DC

## 2024-07-18 MED ORDER — FLUTICASONE PROPIONATE HFA 110 MCG/ACT IN AERO
INHALATION_SPRAY | RESPIRATORY_TRACT | 3 refills | Status: DC
Start: 2024-07-18 — End: 2024-10-01

## 2024-07-18 MED ORDER — AZITHROMYCIN 250 MG PO TABS: ORAL_TABLET | ORAL | 0 refills | Status: AC

## 2024-07-18 NOTE — Progress Notes (Signed)
 [facility] [address] [place], [place] [address]  Internal MEDICINE  Telephone Visit  Patient Name: Bonnie West  [id number]  [id number]  Date of Service: 07/18/2024  I connected with the patient at 0822 by telephone and verified the patients identity using two identifiers.   I discussed the limitations, risks, security and privacy concerns of performing an evaluation and management service by telephone and the availability of in person appointments. I also discussed with the patient that there may be a patient responsible charge related to the service.  The patient expressed understanding and agrees to proceed.    Chief Complaint  Patient presents with   Telephone Screen    Sinus infection, wheezing    Telephone Assessment    HPI Demesha presents for a telehealth virtual visit for sinus infection Onset of symptoms was last week, starting with nosebleed, has not used CPAP for a few days.  --reports cough, wheezing, nasal congestion, runny nose, dry mouth, scratchy throat, poor sleep, sinus pressure, enlarged lymph nodes.  Also has moderate persistent asthma and has been on flovent  inhaler for a long time. After switching insurances at the beginning of this year, her current insurance lists her flovent  inhaler as nonpreferred. She is requesting an application be filed for coverage exclusion with her prescription insurance which she reports is her [employer]. She reports that this inhaler has worked very well for her for a long time. She has been without it for a few months and has had more episodes of SOB and wheezing since this she has not been able to get this inhaler.    Current Medication: Outpatient Encounter Medications as of 07/18/2024  Medication Sig Note   azithromycin  (ZITHROMAX ) 250 MG tablet Take 2 tablets on day 1, then 1 tablet daily on days 2 through 5    predniSONE  (DELTASONE ) 10 MG tablet Take one tab 3 x day for 3 days, then take one tab 2 x a day for  3 days and then take one tab a day for 3 days for uri    acetaminophen  (TYLENOL ) 500 MG tablet Take 1,000 mg by mouth 2 (two) times daily as needed for mild pain or moderate pain.    albuterol  (VENTOLIN  HFA) 108 (90 Base) MCG/ACT inhaler Inhale 1-2 puffs into the lungs every 4 (four) hours as needed for wheezing or shortness of breath.    ALPRAZolam  (XANAX ) 0.25 MG tablet Take 1 tablet (0.25 mg total) by mouth 2 (two) times daily as needed for anxiety.    bisacodyl  (DULCOLAX) 5 MG EC tablet Take 5-10 mg by mouth at bedtime as needed for moderate constipation. Two at night    cetirizine  (ZYRTEC ) 10 MG tablet Take 1 tablet (10 mg total) by mouth daily.    cholecalciferol (VITAMIN D ) 1000 units tablet Take 1,000 Units by mouth at bedtime.    clindamycin  (CLEOCIN  T) 1 % external solution Apply 1 Application topically 2 (two) times daily as needed. For skin breakdown    cyclobenzaprine  (FLEXERIL ) 5 MG tablet Take 1-2 tablets (5-10 mg total) by mouth at bedtime. 04/18/2023: Takes as needed   docusate sodium  (COLACE) 100 MG capsule Take 100-200 mg by mouth at bedtime as needed for mild constipation or moderate constipation.     estradiol  (ESTRACE ) 0.1 MG/GM vaginal cream INSERT 1 GRAM VAGINALLY TWICE WEEKLY AT BEDTIME    ezetimibe  (ZETIA ) 10 MG tablet Take 1 tablet (10 mg total) by mouth daily.    fenofibrate  (TRICOR ) 145 MG tablet  Take 1 tablet (145 mg total) by mouth daily.    FLUoxetine  (PROZAC ) 40 MG capsule Take 1 capsule (40 mg total) by mouth daily.    fluticasone  (FLOVENT  HFA) 110 MCG/ACT inhaler USE 2 PUFFS INHALED TWICE DAILY    HYDROcodone -acetaminophen  (NORCO/VICODIN) 5-325 MG tablet Take 1 tablet by mouth every 6 (six) hours as needed for severe pain (pain score 7-10) or moderate pain (pain score 4-6).    ipratropium (ATROVENT  HFA) 17 MCG/ACT inhaler INHALE 2 PUFFS BY MOUTH 4 TIMES DAILY    ipratropium-albuterol  (DUONEB) 0.5-2.5 (3) MG/3ML SOLN TAKE 3 MLS BY NEBULIZATION EVERY 4 (FOUR) HOURS AS  NEEDED (SOB, WHEEZING).    isometheptene-acetaminophen -dichloralphenazone (MIDRIN) 65-100-325 MG capsule Take 1 capsule by mouth 4 (four) times daily as needed for migraine. Maximum 5 capsules in 12 hours for migraine headaches, 8 capsules in 24 hours for tension headaches.    levothyroxine  (SYNTHROID ) 100 MCG tablet TAKE 1 TABLET BY MOUTH EVERY DAY IN THE MORNING    lisinopril  (ZESTRIL ) 10 MG tablet Take 1 tablet (10 mg total) by mouth daily.    magnesium  oxide (MAG-OX) 400 MG tablet Take 400 mg by mouth at bedtime.     montelukast  (SINGULAIR ) 10 MG tablet TAKE 1 TABLET BY MOUTH EVERY DAY    omeprazole  (PRILOSEC) 40 MG capsule TAKE 1 CAPSULE BY MOUTH EVERY DAY    polyethylene glycol powder (GLYCOLAX /MIRALAX ) 17 GM/SCOOP powder Take 17 g by mouth every evening.    triamcinolone  cream (KENALOG ) 0.1 % Apply 1 application topically 2 (two) times daily as needed. For skin breakdown    vitamin B-12 (CYANOCOBALAMIN) 1000 MCG tablet Take 1,000 mcg by mouth daily.    zolpidem  (AMBIEN ) 5 MG tablet Take 0.5-1 tablets (2.5-5 mg total) by mouth at bedtime as needed for sleep.    [DISCONTINUED] fluticasone  (FLOVENT  HFA) 110 MCG/ACT inhaler USE 2 PUFFS INHALED TWICE DAILY    No facility-administered encounter medications on file as of 07/18/2024.    Surgical History: Past Surgical History:  Procedure Laterality Date   BUNIONECTOMY     CATARACT EXTRACTION W/PHACO Right 10/11/2016   Procedure: CATARACT EXTRACTION PHACO AND INTRAOCULAR LENS PLACEMENT (IOC);  Surgeon: Elsie Carmine, MD;  Location: [facility] ORS;  Service: Ophthalmology;  Laterality: Right;  Lot# [id number] H US : 00:37.7 AP%: 18.1 CDE: 6.80   CATARACT EXTRACTION W/PHACO Left 11/08/2016   Procedure: CATARACT EXTRACTION PHACO AND INTRAOCULAR LENS PLACEMENT (IOC);  Surgeon: Elsie Carmine, MD;  Location: [facility] ORS;  Service: Ophthalmology;  Laterality: Left;  PACK LOT: [id number] H US :00:32 AP:44 CDE:6.46   COLONOSCOPY     COLONOSCOPY WITH PROPOFOL   N/A 11/26/2015   Procedure: COLONOSCOPY WITH PROPOFOL ;  Surgeon: Lamar ONEIDA Holmes, MD;  Location: [facility] ENDOSCOPY;  Service: Endoscopy;  Laterality: N/A;   COLONOSCOPY WITH PROPOFOL  N/A 03/04/2024   Procedure: COLONOSCOPY WITH PROPOFOL ;  Surgeon: Maryruth Ole ONEIDA, MD;  Location: [facility] ENDOSCOPY;  Service: Endoscopy;  Laterality: N/A;   ESOPHAGOGASTRODUODENOSCOPY (EGD) WITH PROPOFOL  N/A 03/04/2024   Procedure: ESOPHAGOGASTRODUODENOSCOPY (EGD) WITH PROPOFOL ;  Surgeon: Maryruth Ole ONEIDA, MD;  Location: [facility] ENDOSCOPY;  Service: Endoscopy;  Laterality: N/A;   EYE SURGERY  07/23/2021   EYE SURGERY Left 09/24/2021   strabusmis, double vision   TONSILLECTOMY      Medical History: Past Medical History:  Diagnosis Date   Allergic rhinitis    Anxiety    Asthma    Blood transfusion without reported diagnosis    Depression    Diverticulosis    Dyspnea    Esophagitis  GERD (gastroesophageal reflux disease)    Headache    Heart murmur    Hyperlipemia    Hypertension    Hypothyroidism    Obesity    Osteopenia    Palpitations    Pneumothorax    Sleep apnea     Family History: Family History  Problem Relation Age of Onset   Breast cancer Maternal Grandmother    Stroke Maternal Grandmother    Bladder Cancer Father    Prostate cancer Father    Heart attack Father    Aortic aneurysm Father    Congestive Heart Failure Father    Colon cancer Paternal Grandmother    Aneurysm Paternal Grandmother    Stroke Paternal Grandmother    Congestive Heart Failure Paternal Grandmother    Dementia Mother    Stroke Mother    Dementia Maternal Grandfather    Stroke Maternal Grandfather    Dementia Paternal Grandfather    Stroke Paternal Grandfather     Social History   Socioeconomic History   Marital status: Married    Spouse name: Not on file   Number of children: Not on file   Years of education: Not on file   Highest education level: Not on file  Occupational History   Not on file   Tobacco Use   Smoking status: Former   Smokeless tobacco: Never  Vaping Use   Vaping status: Never Used  Substance and Sexual Activity   Alcohol use: No   Drug use: No   Sexual activity: Not Currently    Partners: Male    Birth control/protection: Post-menopausal  Other Topics Concern   Not on file  Social History Narrative   Not on file   Social Drivers of Health   Financial Resource Strain: Low Risk  (03/23/2021)   Overall Financial Resource Strain (CARDIA)    Difficulty of Paying Living Expenses: Not very hard  Food Insecurity: No Food Insecurity (04/19/2023)   Hunger Vital Sign    Worried About Running Out of Food in the Last Year: Never true    Ran Out of Food in the Last Year: Never true  Transportation Needs: No Transportation Needs (04/19/2023)   PRAPARE - Administrator, Civil Service (Medical): No    Lack of Transportation (Non-Medical): No  Physical Activity: Not on file  Stress: Not on file  Social Connections: Not on file  Intimate Partner Violence: Not At Risk (04/19/2023)   Humiliation, Afraid, Rape, and Kick questionnaire    Fear of Current or Ex-Partner: No    Emotionally Abused: No    Physically Abused: No    Sexually Abused: No      Review of Systems  Constitutional:  Positive for fatigue. Negative for chills and fever.  HENT:  Positive for congestion, postnasal drip, rhinorrhea, sinus pressure, sinus pain and sore throat. Negative for ear pain.   Respiratory:  Positive for cough, chest tightness, shortness of breath and wheezing.   Cardiovascular: Negative.  Negative for chest pain and palpitations.  Neurological:  Positive for headaches.    Vital Signs: Resp 16   Ht 5' 1 (1.549 m)   Wt 144 lb (65.3 kg)   BMI 27.21 kg/m    Observation/Objective: She is alert and oriented. No acute distress noted.     Assessment/Plan: 1. Acute non-recurrent frontal sinusitis (Primary) Take zpak and prednisone  taper as prescribed until gone.   - azithromycin  (ZITHROMAX ) 250 MG tablet; Take 2 tablets on day 1, then 1 tablet daily  on days 2 through 5  Dispense: 6 tablet; Refill: 0 - predniSONE  (DELTASONE ) 10 MG tablet; Take one tab 3 x day for 3 days, then take one tab 2 x a day for 3 days and then take one tab a day for 3 days for uri  Dispense: 18 tablet; Refill: 0  2. Wheezing Prednisone  taper prescribed, take until gone.  - predniSONE  (DELTASONE ) 10 MG tablet; Take one tab 3 x day for 3 days, then take one tab 2 x a day for 3 days and then take one tab a day for 3 days for uri  Dispense: 18 tablet; Refill: 0  3. Moderate persistent asthma without status asthmaticus without complication Continue flovent  inhaler, will send in application for coverage exception  - fluticasone  (FLOVENT  HFA) 110 MCG/ACT inhaler; USE 2 PUFFS INHALED TWICE DAILY  Dispense: 36 each; Refill: 3   General Counseling: Makeisha verbalizes understanding of the findings of today's phone visit and agrees with plan of treatment. I have discussed any further diagnostic evaluation that may be needed or ordered today. We also reviewed her medications today. she has been encouraged to call the office with any questions or concerns that should arise related to todays visit.  Return if symptoms worsen or fail to improve, for and keep regular scheduled follow up.   No orders of the defined types were placed in this encounter.   Meds ordered this encounter  Medications   azithromycin  (ZITHROMAX ) 250 MG tablet    Sig: Take 2 tablets on day 1, then 1 tablet daily on days 2 through 5    Dispense:  6 tablet    Refill:  0    Fill new script today   predniSONE  (DELTASONE ) 10 MG tablet    Sig: Take one tab 3 x day for 3 days, then take one tab 2 x a day for 3 days and then take one tab a day for 3 days for uri    Dispense:  18 tablet    Refill:  0    Fill new script today   fluticasone  (FLOVENT  HFA) 110 MCG/ACT inhaler    Sig: USE 2 PUFFS INHALED TWICE DAILY     Dispense:  36 each    Refill:  3    Time spent:30 Minutes Time spent with patient included reviewing progress notes, labs, imaging studies, and discussing plan for follow up.  [place] Controlled Substance Database was reviewed by me for overdose risk score (ORS) if appropriate.  This patient was seen by Mardy Maxin, FNP-C in collaboration with Dr. Sigrid Bathe as a part of collaborative care agreement.  Eiliyah Reh R. Maxin, MSN, FNP-C Internal medicine

## 2024-07-24 ENCOUNTER — Other Ambulatory Visit: Payer: Self-pay | Admitting: Nurse Practitioner

## 2024-07-24 DIAGNOSIS — J4540 Moderate persistent asthma, uncomplicated: Secondary | ICD-10-CM

## 2024-08-08 ENCOUNTER — Encounter: Payer: Self-pay | Admitting: Dermatology

## 2024-08-08 ENCOUNTER — Ambulatory Visit (INDEPENDENT_AMBULATORY_CARE_PROVIDER_SITE_OTHER): Admitting: Dermatology

## 2024-08-08 DIAGNOSIS — L309 Dermatitis, unspecified: Secondary | ICD-10-CM

## 2024-08-08 DIAGNOSIS — L739 Follicular disorder, unspecified: Secondary | ICD-10-CM

## 2024-08-08 DIAGNOSIS — S30810A Abrasion of lower back and pelvis, initial encounter: Secondary | ICD-10-CM | POA: Diagnosis not present

## 2024-08-08 DIAGNOSIS — T148XXA Other injury of unspecified body region, initial encounter: Secondary | ICD-10-CM

## 2024-08-08 MED ORDER — CLINDAMYCIN PHOSPHATE 1 % EX SOLN: 1.0000 | Freq: Two times a day (BID) | CUTANEOUS | 5 refills | Status: AC | PRN

## 2024-08-08 MED ORDER — MUPIROCIN 2 % EX OINT: 1.0000 | TOPICAL_OINTMENT | Freq: Every day | CUTANEOUS | 6 refills | Status: AC

## 2024-08-08 NOTE — Progress Notes (Signed)
   New Patient Visit   Subjective  CIERRIA HEIGHT is a 73 y.o. female who presents for the following: break down of skin on buttocks/ bumps, 55m, itchy prn, scratches at, using Clindamycin  sol bid/tid, neosporin, alcohol, peroxide, hx of prolapsed uterus     The following portions of the chart were reviewed this encounter and updated as appropriate: medications, allergies, medical history  Review of Systems:  No other skin or systemic complaints except as noted in HPI or Assessment and Plan.  Objective  Well appearing patient in no apparent distress; mood and affect are within normal limits.   A focused examination was performed of the following areas: buttocks  Relevant exam findings are noted in the Assessment and Plan.    Assessment & Plan   FOLLICULITIS and excoriation Exam: crusted excoriated papules on L buttocks/prox post thigh > R side  Folliculitis occurs due to inflammation of the superficial hair follicle (pore), resulting in acne-like lesions (pus bumps). It can be infectious (bacterial, fungal) or noninfectious (shaving, tight clothing, heat/sweat, medications).  Folliculitis can be acute or chronic and recommended treatment depends on the underlying cause of folliculitis.  Treatment Plan: D/C Neosporin Start Mupirocin  oint qd to sores on buttocks Cont Clindamycin  sol qd/bid to aa buttocks Avoid picking/scratching Discussed keeping area covered with bandage to avoid scratching/picking HSV and VZV PCR today  DERMATITIS   Related Procedures HSV and VZV PCR Panel Related Medications mupirocin  ointment (BACTROBAN ) 2 % Apply 1 Application topically daily. Qd to sores on buttocks prn flares clindamycin  (CLEOCIN  T) 1 % external solution Apply 1 Application topically 2 (two) times daily as needed. Bid to buttocks for folliculitis EXCORIATION    Return if symptoms worsen or fail to improve.  I, Grayce Saunas, RMA, am acting as scribe for Boneta Sharps, MD  .   Documentation: I have reviewed the above documentation for accuracy and completeness, and I agree with the above.  Boneta Sharps, MD

## 2024-08-08 NOTE — Patient Instructions (Signed)

## 2024-08-10 LAB — HSV AND VZV PCR PANEL
HSV 2 DNA: NEGATIVE
HSV-1 DNA: NEGATIVE
Varicella-Zoster, PCR: NEGATIVE

## 2024-08-11 ENCOUNTER — Ambulatory Visit: Payer: Self-pay | Admitting: Dermatology

## 2024-08-15 ENCOUNTER — Encounter: Payer: Self-pay | Admitting: Nurse Practitioner

## 2024-08-16 ENCOUNTER — Ambulatory Visit: Admitting: Nurse Practitioner

## 2024-08-16 ENCOUNTER — Encounter: Payer: Self-pay | Admitting: Nurse Practitioner

## 2024-08-16 VITALS — BP 128/80 | HR 60 | Temp 97.8°F | Resp 16 | Ht 61.0 in | Wt 145.2 lb

## 2024-08-16 DIAGNOSIS — Z79899 Other long term (current) drug therapy: Secondary | ICD-10-CM

## 2024-08-16 DIAGNOSIS — M545 Low back pain, unspecified: Secondary | ICD-10-CM | POA: Diagnosis not present

## 2024-08-16 DIAGNOSIS — G8929 Other chronic pain: Secondary | ICD-10-CM

## 2024-08-16 DIAGNOSIS — J4540 Moderate persistent asthma, uncomplicated: Secondary | ICD-10-CM | POA: Diagnosis not present

## 2024-08-16 DIAGNOSIS — I700 Atherosclerosis of aorta: Secondary | ICD-10-CM | POA: Diagnosis not present

## 2024-08-16 DIAGNOSIS — F411 Generalized anxiety disorder: Secondary | ICD-10-CM | POA: Diagnosis not present

## 2024-08-16 DIAGNOSIS — J0110 Acute frontal sinusitis, unspecified: Secondary | ICD-10-CM | POA: Diagnosis not present

## 2024-08-16 DIAGNOSIS — J301 Allergic rhinitis due to pollen: Secondary | ICD-10-CM | POA: Diagnosis not present

## 2024-08-16 LAB — POCT URINE DRUG SCREEN
Methylenedioxyamphetamine: NOT DETECTED
POC Amphetamine UR: NOT DETECTED
POC BENZODIAZEPINES UR: POSITIVE — AB
POC Barbiturate UR: NOT DETECTED
POC Cocaine UR: NOT DETECTED
POC Ecstasy UR: NOT DETECTED
POC Marijuana UR: NOT DETECTED
POC Methadone UR: NOT DETECTED
POC Methamphetamine UR: NOT DETECTED
POC Opiate Ur: NOT DETECTED
POC Oxycodone UR: NOT DETECTED
POC PHENCYCLIDINE UR: NOT DETECTED
POC TRICYCLICS UR: NOT DETECTED

## 2024-08-16 MED ORDER — HYDROCODONE-ACETAMINOPHEN 5-325 MG PO TABS: 1.0000 | ORAL_TABLET | Freq: Four times a day (QID) | ORAL | 0 refills | Status: DC | PRN

## 2024-08-16 MED ORDER — ALPRAZOLAM 0.25 MG PO TABS: 0.2500 mg | ORAL_TABLET | Freq: Two times a day (BID) | ORAL | 2 refills | Status: DC | PRN

## 2024-08-16 MED ORDER — FLUOXETINE HCL 40 MG PO CAPS: 40.0000 mg | ORAL_CAPSULE | Freq: Every day | ORAL | 1 refills | Status: AC

## 2024-08-16 MED ORDER — AMOXICILLIN-POT CLAVULANATE 875-125 MG PO TABS: 1.0000 | ORAL_TABLET | Freq: Two times a day (BID) | ORAL | 0 refills | Status: AC

## 2024-08-16 MED ORDER — ATROVENT HFA 17 MCG/ACT IN AERS
INHALATION_SPRAY | RESPIRATORY_TRACT | 3 refills | Status: AC
Start: 2024-08-16 — End: ?

## 2024-08-16 MED ORDER — ALBUTEROL SULFATE HFA 108 (90 BASE) MCG/ACT IN AERS: 1.0000 | INHALATION_SPRAY | RESPIRATORY_TRACT | 5 refills | Status: AC | PRN

## 2024-08-16 MED ORDER — MONTELUKAST SODIUM 10 MG PO TABS: 10.0000 mg | ORAL_TABLET | Freq: Every day | ORAL | 3 refills | Status: AC

## 2024-08-16 MED ORDER — CETIRIZINE HCL 10 MG PO TABS: 10.0000 mg | ORAL_TABLET | Freq: Every day | ORAL | 1 refills | Status: AC

## 2024-08-16 MED ORDER — FENOFIBRATE 145 MG PO TABS: 145.0000 mg | ORAL_TABLET | Freq: Every day | ORAL | 1 refills | Status: AC

## 2024-08-16 NOTE — Progress Notes (Signed)
 [facility] [address] [place], [place] [address]  Internal MEDICINE  Office Visit Note  Patient Name: Bonnie West  [id number]  [id number]  Date of Service: 08/16/2024  Chief Complaint  Patient presents with   Depression   Gastroesophageal Reflux   Hyperlipidemia   Hypertension   Follow-up    HPI Alima presents for a follow-up visit for asthma, GAD, chronic pain, prolapsed bladder and medication refills.  Flovent  inhaler -- not covered by insurance, wants an exception for coverage. Not willing to try other inhalers. Takes inhaler for asthma. Paperwork was sent in this week for coverage exception.  Anxiety -- takes alprazolam  as needed, due for refills. UDS due today as well  Prolapsed bladder -- needs to see urogynecology which is scheduled for October. Chronic pain -- takes hydrocodone  as needed, due for refills  Due for multiple refills.  UDS done today, positive for benzos.     Current Medication: Outpatient Encounter Medications as of 08/16/2024  Medication Sig Note   amoxicillin -clavulanate (AUGMENTIN ) 875-125 MG tablet Take 1 tablet by mouth 2 (two) times daily for 7 days. Take with food    acetaminophen  (TYLENOL ) 500 MG tablet Take 1,000 mg by mouth 2 (two) times daily as needed for mild pain or moderate pain.    albuterol  (VENTOLIN  HFA) 108 (90 Base) MCG/ACT inhaler Inhale 1-2 puffs into the lungs every 4 (four) hours as needed for wheezing or shortness of breath.    ALPRAZolam  (XANAX ) 0.25 MG tablet Take 1 tablet (0.25 mg total) by mouth 2 (two) times daily as needed for anxiety.    bisacodyl  (DULCOLAX) 5 MG EC tablet Take 5-10 mg by mouth at bedtime as needed for moderate constipation. Two at night    cetirizine  (ZYRTEC ) 10 MG tablet Take 1 tablet (10 mg total) by mouth daily.    cholecalciferol (VITAMIN D ) 1000 units tablet Take 1,000 Units by mouth at bedtime.    clindamycin  (CLEOCIN  T) 1 % external solution Apply 1 Application topically 2 (two)  times daily as needed. Bid to buttocks for folliculitis    cyclobenzaprine  (FLEXERIL ) 5 MG tablet Take 1-2 tablets (5-10 mg total) by mouth at bedtime. 04/18/2023: Takes as needed   docusate sodium  (COLACE) 100 MG capsule Take 100-200 mg by mouth at bedtime as needed for mild constipation or moderate constipation.     ezetimibe  (ZETIA ) 10 MG tablet Take 1 tablet (10 mg total) by mouth daily.    fenofibrate  (TRICOR ) 145 MG tablet Take 1 tablet (145 mg total) by mouth daily.    FLUoxetine  (PROZAC ) 40 MG capsule Take 1 capsule (40 mg total) by mouth daily.    fluticasone  (FLOVENT  HFA) 110 MCG/ACT inhaler USE 2 PUFFS INHALED TWICE DAILY    HYDROcodone -acetaminophen  (NORCO/VICODIN) 5-325 MG tablet Take 1 tablet by mouth every 6 (six) hours as needed for severe pain (pain score 7-10) or moderate pain (pain score 4-6).    [START ON 09/21/2024] HYDROcodone -acetaminophen  (NORCO/VICODIN) 5-325 MG tablet Take 1 tablet by mouth every 6 (six) hours as needed for severe pain (pain score 7-10) or moderate pain (pain score 4-6).    ipratropium (ATROVENT  HFA) 17 MCG/ACT inhaler INHALE 2 PUFFS BY MOUTH 4 TIMES DAILY    ipratropium-albuterol  (DUONEB) 0.5-2.5 (3) MG/3ML SOLN TAKE 3 MLS BY NEBULIZATION EVERY 4 (FOUR) HOURS AS NEEDED (SOB, WHEEZING).    isometheptene-acetaminophen -dichloralphenazone (MIDRIN) 65-100-325 MG capsule Take 1 capsule by mouth 4 (four) times daily as needed for migraine. Maximum 5 capsules in 12 hours for  migraine headaches, 8 capsules in 24 hours for tension headaches.    levothyroxine  (SYNTHROID ) 100 MCG tablet TAKE 1 TABLET BY MOUTH EVERY DAY IN THE MORNING    lisinopril  (ZESTRIL ) 10 MG tablet Take 1 tablet (10 mg total) by mouth daily.    magnesium  oxide (MAG-OX) 400 MG tablet Take 400 mg by mouth at bedtime.     montelukast  (SINGULAIR ) 10 MG tablet Take 1 tablet (10 mg total) by mouth daily.    mupirocin  ointment (BACTROBAN ) 2 % Apply 1 Application topically daily. Qd to sores on buttocks prn  flares    omeprazole  (PRILOSEC) 40 MG capsule TAKE 1 CAPSULE BY MOUTH EVERY DAY    polyethylene glycol powder (GLYCOLAX /MIRALAX ) 17 GM/SCOOP powder Take 17 g by mouth every evening.    triamcinolone  cream (KENALOG ) 0.1 % Apply 1 application topically 2 (two) times daily as needed. For skin breakdown    vitamin B-12 (CYANOCOBALAMIN) 1000 MCG tablet Take 1,000 mcg by mouth daily.    zolpidem  (AMBIEN ) 5 MG tablet Take 0.5-1 tablets (2.5-5 mg total) by mouth at bedtime as needed for sleep.    [DISCONTINUED] albuterol  (VENTOLIN  HFA) 108 (90 Base) MCG/ACT inhaler Inhale 1-2 puffs into the lungs every 4 (four) hours as needed for wheezing or shortness of breath.    [DISCONTINUED] ALPRAZolam  (XANAX ) 0.25 MG tablet Take 1 tablet (0.25 mg total) by mouth 2 (two) times daily as needed for anxiety.    [DISCONTINUED] cetirizine  (ZYRTEC ) 10 MG tablet Take 1 tablet (10 mg total) by mouth daily.    [DISCONTINUED] estradiol  (ESTRACE ) 0.1 MG/GM vaginal cream INSERT 1 GRAM VAGINALLY TWICE WEEKLY AT BEDTIME (Patient not taking: Reported on 08/08/2024)    [DISCONTINUED] fenofibrate  (TRICOR ) 145 MG tablet Take 1 tablet (145 mg total) by mouth daily.    [DISCONTINUED] FLUoxetine  (PROZAC ) 40 MG capsule Take 1 capsule (40 mg total) by mouth daily.    [DISCONTINUED] HYDROcodone -acetaminophen  (NORCO/VICODIN) 5-325 MG tablet Take 1 tablet by mouth every 6 (six) hours as needed for severe pain (pain score 7-10) or moderate pain (pain score 4-6).    [DISCONTINUED] ipratropium (ATROVENT  HFA) 17 MCG/ACT inhaler INHALE 2 PUFFS BY MOUTH 4 TIMES DAILY    [DISCONTINUED] montelukast  (SINGULAIR ) 10 MG tablet TAKE 1 TABLET BY MOUTH EVERY DAY    [DISCONTINUED] predniSONE  (DELTASONE ) 10 MG tablet Take one tab 3 x day for 3 days, then take one tab 2 x a day for 3 days and then take one tab a day for 3 days for uri (Patient not taking: Reported on 08/08/2024)    No facility-administered encounter medications on file as of 08/16/2024.     Surgical History: Past Surgical History:  Procedure Laterality Date   BUNIONECTOMY     CATARACT EXTRACTION W/PHACO Right 10/11/2016   Procedure: CATARACT EXTRACTION PHACO AND INTRAOCULAR LENS PLACEMENT (IOC);  Surgeon: Elsie Carmine, MD;  Location: [facility] ORS;  Service: Ophthalmology;  Laterality: Right;  Lot# [id number] H US : 00:37.7 AP%: 18.1 CDE: 6.80   CATARACT EXTRACTION W/PHACO Left 11/08/2016   Procedure: CATARACT EXTRACTION PHACO AND INTRAOCULAR LENS PLACEMENT (IOC);  Surgeon: Elsie Carmine, MD;  Location: [facility] ORS;  Service: Ophthalmology;  Laterality: Left;  PACK LOT: [id number] H US :00:32 AP:44 CDE:6.46   COLONOSCOPY     COLONOSCOPY WITH PROPOFOL  N/A 11/26/2015   Procedure: COLONOSCOPY WITH PROPOFOL ;  Surgeon: Lamar ONEIDA Holmes, MD;  Location: [facility];  Service: Endoscopy;  Laterality: N/A;   COLONOSCOPY WITH PROPOFOL  N/A 03/04/2024   Procedure: COLONOSCOPY WITH PROPOFOL ;  Surgeon: Maryruth Ole ONEIDA,  MD;  Location: [facility] ENDOSCOPY;  Service: Endoscopy;  Laterality: N/A;   ESOPHAGOGASTRODUODENOSCOPY (EGD) WITH PROPOFOL  N/A 03/04/2024   Procedure: ESOPHAGOGASTRODUODENOSCOPY (EGD) WITH PROPOFOL ;  Surgeon: Maryruth Ole DASEN, MD;  Location: [facility] ENDOSCOPY;  Service: Endoscopy;  Laterality: N/A;   EYE SURGERY  07/23/2021   EYE SURGERY Left 09/24/2021   strabusmis, double vision   TONSILLECTOMY      Medical History: Past Medical History:  Diagnosis Date   Allergic rhinitis    Anxiety    Asthma    Blood transfusion without reported diagnosis    Depression    Diverticulosis    Dyspnea    Esophagitis    GERD (gastroesophageal reflux disease)    Headache    Heart murmur    Hyperlipemia    Hypertension    Hypothyroidism    Obesity    Osteopenia    Palpitations    Pneumothorax    Sleep apnea     Family History: Family History  Problem Relation Age of Onset   Breast cancer Maternal Grandmother    Stroke Maternal Grandmother    Bladder Cancer Father     Prostate cancer Father    Heart attack Father    Aortic aneurysm Father    Congestive Heart Failure Father    Colon cancer Paternal Grandmother    Aneurysm Paternal Grandmother    Stroke Paternal Grandmother    Congestive Heart Failure Paternal Grandmother    Dementia Mother    Stroke Mother    Dementia Maternal Grandfather    Stroke Maternal Grandfather    Dementia Paternal Grandfather    Stroke Paternal Grandfather     Social History   Socioeconomic History   Marital status: Married    Spouse name: Not on file   Number of children: Not on file   Years of education: Not on file   Highest education level: Not on file  Occupational History   Not on file  Tobacco Use   Smoking status: Former   Smokeless tobacco: Never  Vaping Use   Vaping status: Never Used  Substance and Sexual Activity   Alcohol use: No   Drug use: No   Sexual activity: Not Currently    Partners: Male    Birth control/protection: Post-menopausal  Other Topics Concern   Not on file  Social History Narrative   Not on file   Social Drivers of Health   Financial Resource Strain: Low Risk  (03/23/2021)   Overall Financial Resource Strain (CARDIA)    Difficulty of Paying Living Expenses: Not very hard  Food Insecurity: No Food Insecurity (04/19/2023)   Hunger Vital Sign    Worried About Running Out of Food in the Last Year: Never true    Ran Out of Food in the Last Year: Never true  Transportation Needs: No Transportation Needs (04/19/2023)   PRAPARE - Administrator, Civil Service (Medical): No    Lack of Transportation (Non-Medical): No  Physical Activity: Not on file  Stress: Not on file  Social Connections: Not on file  Intimate Partner Violence: Not At Risk (04/19/2023)   Humiliation, Afraid, Rape, and Kick questionnaire    Fear of Current or Ex-Partner: No    Emotionally Abused: No    Physically Abused: No    Sexually Abused: No      Review of Systems  Constitutional:   Positive for fatigue. Negative for chills and unexpected weight change.  HENT:  Positive for congestion, rhinorrhea, sinus pressure and sinus  pain. Negative for sneezing and sore throat.   Eyes:  Negative for redness.  Respiratory:  Negative for cough, chest tightness, shortness of breath and wheezing.   Cardiovascular: Negative.  Negative for chest pain and palpitations.  Gastrointestinal: Negative.  Negative for abdominal pain, constipation, diarrhea, nausea and vomiting.  Genitourinary:  Negative for dysuria and frequency.  Musculoskeletal:  Positive for arthralgias and back pain. Negative for joint swelling and neck pain.  Skin:  Negative for rash.  Neurological: Negative.  Negative for tremors and numbness.  Hematological:  Negative for adenopathy. Does not bruise/bleed easily.  Psychiatric/Behavioral:  Positive for sleep disturbance (improved with ambien ). Negative for behavioral problems (Depression), self-injury and suicidal ideas. The patient is nervous/anxious (stable with medications).     Vital Signs: BP 128/80   Pulse 60   Temp 97.8 F (36.6 C)   Resp 16   Ht 5' 1 (1.549 m)   Wt 145 lb 3.2 oz (65.9 kg)   SpO2 97%   BMI 27.44 kg/m    Physical Exam Vitals reviewed.  Constitutional:      General: She is not in acute distress.    Appearance: Normal appearance. She is normal weight. She is not ill-appearing.  HENT:     Head: Normocephalic and atraumatic.     Right Ear: Tympanic membrane, ear canal and external ear normal.     Left Ear: Tympanic membrane, ear canal and external ear normal.     Nose: Congestion and rhinorrhea present.     Mouth/Throat:     Mouth: Mucous membranes are moist.     Pharynx: Posterior oropharyngeal erythema present.  Eyes:     Extraocular Movements: Extraocular movements intact.     Conjunctiva/sclera: Conjunctivae normal.     Pupils: Pupils are equal, round, and reactive to light.  Cardiovascular:     Rate and Rhythm: Normal rate and  regular rhythm.     Heart sounds: Normal heart sounds. No murmur heard. Pulmonary:     Effort: Pulmonary effort is normal. No respiratory distress.     Breath sounds: Normal breath sounds. No wheezing.  Neurological:     Mental Status: She is alert and oriented to person, place, and time.     Cranial Nerves: No cranial nerve deficit.     Coordination: Coordination normal.     Gait: Gait normal.  Psychiatric:        Mood and Affect: Mood normal.        Behavior: Behavior normal.        Assessment/Plan: 1. Acute non-recurrent frontal sinusitis (Primary) Augmentin  prescribed, take until gone  - amoxicillin -clavulanate (AUGMENTIN ) 875-125 MG tablet; Take 1 tablet by mouth 2 (two) times daily for 7 days. Take with food  Dispense: 14 tablet; Refill: 0  2. Moderate persistent asthma without status asthmaticus without complication Stable, continue medications as prescribed. Waiting to hear about the coverage exception for flovent .  - albuterol  (VENTOLIN  HFA) 108 (90 Base) MCG/ACT inhaler; Inhale 1-2 puffs into the lungs every 4 (four) hours as needed for wheezing or shortness of breath.  Dispense: 18 each; Refill: 5 - ipratropium (ATROVENT  HFA) 17 MCG/ACT inhaler; INHALE 2 PUFFS BY MOUTH 4 TIMES DAILY  Dispense: 12.9 each; Refill: 3 - montelukast  (SINGULAIR ) 10 MG tablet; Take 1 tablet (10 mg total) by mouth daily.  Dispense: 90 tablet; Refill: 3  3. Aortic atherosclerosis Continue fenofibrate  as prescribed.  - fenofibrate  (TRICOR ) 145 MG tablet; Take 1 tablet (145 mg total) by mouth daily.  Dispense:  90 tablet; Refill: 1  4. Chronic midline low back pain without sciatica Continue prn hydrocodone  as prescribed. Follow up in 3 months for additional symptoms.  - HYDROcodone -acetaminophen  (NORCO/VICODIN) 5-325 MG tablet; Take 1 tablet by mouth every 6 (six) hours as needed for severe pain (pain score 7-10) or moderate pain (pain score 4-6).  Dispense: 60 tablet; Refill: 0 -  HYDROcodone -acetaminophen  (NORCO/VICODIN) 5-325 MG tablet; Take 1 tablet by mouth every 6 (six) hours as needed for severe pain (pain score 7-10) or moderate pain (pain score 4-6).  Dispense: 60 tablet; Refill: 0  5. Allergic rhinitis due to pollen, unspecified seasonality Continue cetirizine  and montelukast  as prescribed.  - cetirizine  (ZYRTEC ) 10 MG tablet; Take 1 tablet (10 mg total) by mouth daily.  Dispense: 90 tablet; Refill: 1 - montelukast  (SINGULAIR ) 10 MG tablet; Take 1 tablet (10 mg total) by mouth daily.  Dispense: 90 tablet; Refill: 3  6. Encounter for long-term (current) use of medications UDS done today in office, positive for benzodiazepines which is consistent with current prescriptions. Negative for opioids but she does not take the hydrocodone  every day.  - POCT Urine Drug Screen - ALPRAZolam  (XANAX ) 0.25 MG tablet; Take 1 tablet (0.25 mg total) by mouth 2 (two) times daily as needed for anxiety.  Dispense: 60 tablet; Refill: 2 - HYDROcodone -acetaminophen  (NORCO/VICODIN) 5-325 MG tablet; Take 1 tablet by mouth every 6 (six) hours as needed for severe pain (pain score 7-10) or moderate pain (pain score 4-6).  Dispense: 60 tablet; Refill: 0 - HYDROcodone -acetaminophen  (NORCO/VICODIN) 5-325 MG tablet; Take 1 tablet by mouth every 6 (six) hours as needed for severe pain (pain score 7-10) or moderate pain (pain score 4-6).  Dispense: 60 tablet; Refill: 0  7. GAD (generalized anxiety disorder) Continue fluoxetine  and prn alprazolam  as prescribed. Follow up in 3 months for additional refills.  - ALPRAZolam  (XANAX ) 0.25 MG tablet; Take 1 tablet (0.25 mg total) by mouth 2 (two) times daily as needed for anxiety.  Dispense: 60 tablet; Refill: 2 - FLUoxetine  (PROZAC ) 40 MG capsule; Take 1 capsule (40 mg total) by mouth daily.  Dispense: 90 capsule; Refill: 1   General Counseling: Ardell verbalizes understanding of the findings of todays visit and agrees with plan of treatment. I have  discussed any further diagnostic evaluation that may be needed or ordered today. We also reviewed her medications today. she has been encouraged to call the office with any questions or concerns that should arise related to todays visit.    Orders Placed This Encounter  Procedures   POCT Urine Drug Screen    Meds ordered this encounter  Medications   ALPRAZolam  (XANAX ) 0.25 MG tablet    Sig: Take 1 tablet (0.25 mg total) by mouth 2 (two) times daily as needed for anxiety.    Dispense:  60 tablet    Refill:  2    For future refills, F41.0 dx code   HYDROcodone -acetaminophen  (NORCO/VICODIN) 5-325 MG tablet    Sig: Take 1 tablet by mouth every 6 (six) hours as needed for severe pain (pain score 7-10) or moderate pain (pain score 4-6).    Dispense:  60 tablet    Refill:  0    For refill in november   FLUoxetine  (PROZAC ) 40 MG capsule    Sig: Take 1 capsule (40 mg total) by mouth daily.    Dispense:  90 capsule    Refill:  1   fenofibrate  (TRICOR ) 145 MG tablet    Sig: Take 1 tablet (  145 mg total) by mouth daily.    Dispense:  90 tablet    Refill:  1   cetirizine  (ZYRTEC ) 10 MG tablet    Sig: Take 1 tablet (10 mg total) by mouth daily.    Dispense:  90 tablet    Refill:  1    Fill new script today   albuterol  (VENTOLIN  HFA) 108 (90 Base) MCG/ACT inhaler    Sig: Inhale 1-2 puffs into the lungs every 4 (four) hours as needed for wheezing or shortness of breath.    Dispense:  18 each    Refill:  5   ipratropium (ATROVENT  HFA) 17 MCG/ACT inhaler    Sig: INHALE 2 PUFFS BY MOUTH 4 TIMES DAILY    Dispense:  12.9 each    Refill:  3   montelukast  (SINGULAIR ) 10 MG tablet    Sig: Take 1 tablet (10 mg total) by mouth daily.    Dispense:  90 tablet    Refill:  3   HYDROcodone -acetaminophen  (NORCO/VICODIN) 5-325 MG tablet    Sig: Take 1 tablet by mouth every 6 (six) hours as needed for severe pain (pain score 7-10) or moderate pain (pain score 4-6).    Dispense:  60 tablet    Refill:   0    For refill   amoxicillin -clavulanate (AUGMENTIN ) 875-125 MG tablet    Sig: Take 1 tablet by mouth 2 (two) times daily for 7 days. Take with food    Dispense:  14 tablet    Refill:  0    Fill script asap today    Return for previously scheduled, AWV, med refill, Jamariyah Johannsen PCP in december.   Total time spent:30 Minutes Time spent includes review of chart, medications, test results, and follow up plan with the patient.   [place] Controlled Substance Database was reviewed by me.  This patient was seen by Mardy Maxin, FNP-C in collaboration with Dr. Sigrid Bathe as a part of collaborative care agreement.   Zakiah Gauthreaux R. Maxin, MSN, FNP-C Internal medicine

## 2024-08-19 ENCOUNTER — Telehealth: Payer: Self-pay

## 2024-08-19 MED ORDER — HYDROCODONE-ACETAMINOPHEN 5-325 MG PO TABS: 1.0000 | ORAL_TABLET | Freq: Four times a day (QID) | ORAL | 0 refills | Status: DC | PRN

## 2024-08-19 NOTE — Telephone Encounter (Signed)
Pt notified that we sent med

## 2024-08-19 NOTE — Addendum Note (Signed)
 Addended by: Maksym Pfiffner on: 08/19/2024 12:02 PM   Modules accepted: Orders

## 2024-09-09 ENCOUNTER — Ambulatory Visit: Admitting: Obstetrics and Gynecology

## 2024-09-09 ENCOUNTER — Encounter: Payer: Self-pay | Admitting: Obstetrics and Gynecology

## 2024-09-09 VITALS — BP 128/81 | HR 59

## 2024-09-09 DIAGNOSIS — R350 Frequency of micturition: Secondary | ICD-10-CM

## 2024-09-09 DIAGNOSIS — N816 Rectocele: Secondary | ICD-10-CM

## 2024-09-09 DIAGNOSIS — N814 Uterovaginal prolapse, unspecified: Secondary | ICD-10-CM

## 2024-09-09 DIAGNOSIS — N952 Postmenopausal atrophic vaginitis: Secondary | ICD-10-CM

## 2024-09-09 LAB — POCT URINALYSIS DIP (CLINITEK)
Bilirubin, UA: NEGATIVE
Blood, UA: NEGATIVE
Glucose, UA: NEGATIVE mg/dL
Ketones, POC UA: NEGATIVE mg/dL
Leukocytes, UA: NEGATIVE
Nitrite, UA: NEGATIVE
POC PROTEIN,UA: NEGATIVE
Spec Grav, UA: 1.015 (ref 1.010–1.025)
Urobilinogen, UA: 0.2 U/dL
pH, UA: 7 (ref 5.0–8.0)

## 2024-09-09 MED ORDER — ESTRADIOL 0.01 % VA CREA
0.5000 g | TOPICAL_CREAM | VAGINAL | 11 refills | Status: AC
Start: 2024-09-09 — End: ?

## 2024-09-09 NOTE — Progress Notes (Signed)
 [facility] New Patient Evaluation and Consultation  Referring Provider: Liana Fish, NP PCP: Liana Fish, NP Date of Service: 09/09/2024  SUBJECTIVE Chief Complaint: New patient - Prolapse & incontinence (Does push it back in but doesn't stay in )  History of Present Illness: Bonnie West is a 73 y.o. White or Caucasian female seen in consultation at the request of Dr. Liana for evaluation of prolapse and incontinence.    Review of records significant for: Has tried a pessaries in the past and it was not comfortable and is on estrogen Long term use of Vicodin On Ambien  for sleep disturbance   Hx of vaginal pessary with Dr. Jertson. Had #5, #6, and #7 RWS pessaries.Suffered from erythema, discharge and erosion with these pessaries and does not feel like trying another pessary.  Urinary Symptoms: Leaks urine with with a full bladder and with urgency Leaks just with full bladder Pad use: 1 liners/ mini-pads per day.   Patient is bothered by UI symptoms.  Day time voids 4.  Nocturia: 2 times per night to void. Voiding dysfunction:  empties bladder well.  Patient does not use a catheter to empty bladder.  When urinating, patient feels she has no difficulties Drinks: Coffee, Green Tea, diet mountain dew and water  per day  UTIs: 0 UTI's in the last year.   Denies history of urologic concerns No results found for the last 90 days.   Pelvic Organ Prolapse Symptoms:                  Patient Admits to a feeling of a bulge the vaginal area. It has been present for 6 years.  Patient Admits to seeing a bulge.  This bulge is bothersome.  Bowel Symptom: Bowel movements: 1 time(s) per day Stool consistency: hard Straining: yes.  Splinting: yes.  Incomplete evacuation: yes.  Patient Denies accidental bowel leakage / fecal incontinence Bowel regimen: fiber, stool softener, and miralax  Last colonoscopy: Date 03/04/2024, Results WNL [facility] Colonoscopy           Upcoming     Colonoscopy (Every 10 Years) Next due on 03/04/2034    03/04/2024  COLONOSCOPY   Only the first 1 history entries have been loaded, but more history exists.                Sexual Function Sexually active: no.  Sexual orientation: Straight Pain with sex: No  Pelvic Pain Admits to pelvic pain Location: Lower back Pain occurs: daily, ongoing Prior pain treatment: pain medication Improved by: meds/heat and cold, rest Worsened by: walking   Past Medical History:  Past Medical History:  Diagnosis Date   Allergic rhinitis    Anxiety    Asthma    Blood transfusion without reported diagnosis    Depression    Diverticulosis    Dyspnea    Esophagitis    GERD (gastroesophageal reflux disease)    Headache    Heart murmur    Hyperlipemia    Hypertension    Hypothyroidism    Obesity    Osteopenia    Palpitations    Pneumothorax    Sleep apnea      Past Surgical History:   Past Surgical History:  Procedure Laterality Date   BUNIONECTOMY     CATARACT EXTRACTION W/PHACO Right 10/11/2016   Procedure: CATARACT EXTRACTION PHACO AND INTRAOCULAR LENS PLACEMENT (IOC);  Surgeon: Elsie Carmine, MD;  Location: [facility] ORS;  Service: Ophthalmology;  Laterality: Right;  Lot# [id number] H US : 00:37.7  AP%: 18.1 CDE: 6.80   CATARACT EXTRACTION W/PHACO Left 11/08/2016   Procedure: CATARACT EXTRACTION PHACO AND INTRAOCULAR LENS PLACEMENT (IOC);  Surgeon: Elsie Carmine, MD;  Location: [facility];  Service: Ophthalmology;  Laterality: Left;  PACK LOT: [id number] H US :00:32 AP:44 CDE:6.46   COLONOSCOPY     COLONOSCOPY WITH PROPOFOL  N/A 11/26/2015   Procedure: COLONOSCOPY WITH PROPOFOL ;  Surgeon: Lamar ONEIDA Holmes, MD;  Location: [facility] END[facility];  Service: Endoscopy;  Laterality: N/A;   COLONOSCOPY WITH PROPOFOL  N/A 03/04/2024   Procedure: COLONOSCOPY WITH PROPOFOL ;  Surgeon: Maryruth Ole ONEIDA, MD;  Location: [facility] ENDOSCOPY;  Service: Endoscopy;  Laterality: N/A;    ESOPHAGOGASTRODUODENOSCOPY (EGD) WITH PROPOFOL  N/A 03/04/2024   Procedure: ESOPHAGOGASTRODUODENOSCOPY (EGD) WITH PROPOFOL ;  Surgeon: Maryruth Ole ONEIDA, MD;  Location: [facility] ENDOSCOPY;  Service: Endoscopy;  Laterality: N/A;   EYE SURGERY  07/23/2021   EYE SURGERY Left 09/24/2021   strabusmis, double vision   TONSILLECTOMY       Past OB/GYN History: G1P1001 Vaginal deliveries: 1,  Forceps/ Vacuum deliveries: 0, Cesarean section: 0 Menopausal: Yes, at age 32 Contraception: N/a. Last pap smear was 2020.  Any history of abnormal pap smears: no. [facility] PAP   This patient has no relevant Health Maintenance data.     Medications: Patient has a current medication list which includes the following prescription(s): acetaminophen , albuterol , alprazolam , bisacodyl , cetirizine , cholecalciferol, clindamycin , cyclobenzaprine , docusate sodium , fenofibrate , fluoxetine , fluticasone , [START ON 09/21/2024] hydrocodone -acetaminophen , hydrocodone -acetaminophen , atrovent  hfa, ipratropium-albuterol , isometheptene-acetaminophen -dichloralphenazone, levothyroxine , lisinopril , magnesium  oxide, montelukast , omeprazole , polyethylene glycol powder, triamcinolone  cream, zolpidem , ezetimibe , and mupirocin  ointment.   Allergies: Patient is allergic to salmeterol, codeine, erythromycin, morphine and codeine, nsaids, and sulfa antibiotics.   Social History:  Social History   Tobacco Use   Smoking status: Former   Smokeless tobacco: Never  Advertising account planner   Vaping status: Never Used  Substance Use Topics   Alcohol use: No   Drug use: No    Relationship status: married Patient lives with husband.   Patient is not employed . Regular exercise: No History of abuse: No  Family History:   Family History  Problem Relation Age of Onset   Breast cancer Maternal Grandmother    Stroke Maternal Grandmother    Bladder Cancer Father    Prostate cancer Father    Heart attack Father    Aortic aneurysm Father    Congestive Heart  Failure Father    Colon cancer Paternal Grandmother    Aneurysm Paternal Grandmother    Stroke Paternal Grandmother    Congestive Heart Failure Paternal Grandmother    Dementia Mother    Stroke Mother    Dementia Maternal Grandfather    Stroke Maternal Grandfather    Dementia Paternal Grandfather    Stroke Paternal Grandfather      Review of Systems: Review of Systems  Constitutional:  Positive for malaise/fatigue. Negative for chills and fever.       +Weight Gain  Respiratory:  Negative for cough and shortness of breath.   Cardiovascular:  Negative for chest pain and palpitations.  Gastrointestinal:  Negative for abdominal pain, blood in stool, constipation and diarrhea.  Skin:  Negative for rash.  Neurological:  Negative for weakness.  Psychiatric/Behavioral:  Negative for depression and suicidal ideas. The patient is nervous/anxious.      OBJECTIVE Physical Exam: Vitals:   09/09/24 0959  BP: 128/81  Pulse: (!) 59    Physical Exam Constitutional:      Appearance: Normal appearance.  Pulmonary:     Effort: Pulmonary effort  is normal.  Abdominal:     Palpations: Abdomen is soft.  Neurological:     General: No focal deficit present.     Mental Status: She is alert and oriented to person, place, and time.  Psychiatric:        Mood and Affect: Mood normal.        Behavior: Behavior normal. Behavior is cooperative.        Thought Content: Thought content normal.      GU / Detailed Urogynecologic Evaluation:  Pelvic Exam: Normal external female genitalia; Bartholin's and Skene's glands normal in appearance; urethral meatus normal in appearance, no urethral masses or discharge.   CST: negative  Speculum exam reveals normal vaginal mucosa with atrophy. Cervix normal appearance. Uterus normal single, nontender. Adnexa normal adnexa.     With apex supported, anterior compartment defect was reduced  Pelvic floor strength II/V  Pelvic floor musculature: Right  levator non-tender, Right obturator tender, Left levator non-tender, Left obturator tender  POP-Q:   POP-Q  0                                            Aa   0                                           Ba  3                                              C   3.5                                            Gh  4                                            Pb  9                                            tvl   0                                            Ap  0                                            Bp  -6                                              D      Rectal  Exam:  Normal external exam  Post-Void Residual (PVR) by Bladder Scan: In order to evaluate bladder emptying, we discussed obtaining a postvoid residual and patient agreed to this procedure.  Procedure: The ultrasound unit was placed on the patient's abdomen in the suprapubic region after the patient had voided.    Post Void Residual - 09/09/24 1015       Post Void Residual   Post Void Residual 12 mL           Laboratory Results: Lab Results  Component Value Date   COLORU yellow 09/09/2024   CLARITYU clear 09/09/2024   GLUCOSEUR negative 09/09/2024   BILIRUBINUR negative 09/09/2024   KETONESU Negative 11/08/2022   SPECGRAV 1.015 09/09/2024   RBCUR negative 09/09/2024   PHUR 7.0 09/09/2024   PROTEINUR Negative 11/08/2022   UROBILINOGEN 0.2 09/09/2024   LEUKOCYTESUR Negative 09/09/2024    Lab Results  Component Value Date   CREATININE 0.84 02/02/2024   CREATININE 0.93 04/24/2023   CREATININE 1.08 (H) 04/23/2023    No results found for: HGBA1C  Lab Results  Component Value Date   HGB 13.6 04/24/2023     ASSESSMENT AND PLAN Ms. Scarbro is a 73 y.o. with:  1. Uterine prolapse   2. Posterior vaginal wall prolapse   3. Frequency of urination    Patient has stage III/IV uterine prolapse. This was not initially visible on patient POP-Q, but once she was squatting and valsalva, the  cervix was +3 on exam and pulling down the anterior wall more than seen on initial exam. We discussed options for prolapse management including pessary and surgical repair. We discussed options of Sacrocolpopexy, Hysterectomy with sacrospinous ligament fixation, and colpocleisis. She believes she would like to retain the ability for sexual intercourse. We discussed risks and benefits of all 3 surgical options with handouts given for all three. She will consider these options.. She is not open to another pessary at this time, even though we discussed that she may not have been properly fitted as most OBGYN offices only carry ring pessaries and she may do well with a Gellhorn or cube pessary.  Patient considering surgical options. Will plan to have her come for urodynamics testing and then follow up with a surgeon for surgical planning.  We discussed the symptoms of overactive bladder (OAB), which include urinary urgency, urinary frequency, nocturia, with or without urge incontinence.  Our focus this visit was more on her prolapse. We can address OAB symptoms more after she has urodynamics so we can pinpoint better OAB symptoms.  Patient has vaginal atrophy on exam. She would benefit from estrogen cream. Patient to use a blueberry sized amount into the vagina. She may use this nightly for 2 weeks and then twice weekly after.   Patient to follow up for UDS.   Veronda Gabor G Kentrel Clevenger, NP

## 2024-09-09 NOTE — Patient Instructions (Addendum)
 Please consider your surgical options I have given you options to consider for surgical repair. You will meet with the surgeon to discuss more definitive surgery scheduling.   We will need to do bladder testing.

## 2024-09-26 ENCOUNTER — Telehealth: Payer: Self-pay

## 2024-09-26 DIAGNOSIS — I10 Essential (primary) hypertension: Secondary | ICD-10-CM

## 2024-09-26 MED ORDER — LISINOPRIL 10 MG PO TABS: 10.0000 mg | ORAL_TABLET | Freq: Every day | ORAL | 3 refills | Status: AC

## 2024-09-26 NOTE — Telephone Encounter (Signed)
 done

## 2024-10-01 ENCOUNTER — Ambulatory Visit: Payer: Medicare Other | Admitting: Internal Medicine

## 2024-10-01 ENCOUNTER — Encounter: Payer: Self-pay | Admitting: Internal Medicine

## 2024-10-01 VITALS — BP 128/78 | HR 77 | Temp 97.8°F | Resp 16 | Ht 61.0 in | Wt 145.0 lb

## 2024-10-01 DIAGNOSIS — J301 Allergic rhinitis due to pollen: Secondary | ICD-10-CM

## 2024-10-01 DIAGNOSIS — K219 Gastro-esophageal reflux disease without esophagitis: Secondary | ICD-10-CM

## 2024-10-01 DIAGNOSIS — J4540 Moderate persistent asthma, uncomplicated: Secondary | ICD-10-CM

## 2024-10-01 MED ORDER — BREYNA 80-4.5 MCG/ACT IN AERO: 2.0000 | INHALATION_SPRAY | Freq: Two times a day (BID) | RESPIRATORY_TRACT | 3 refills | Status: DC

## 2024-10-01 NOTE — Progress Notes (Signed)
 [facility] [address] [place], [place] [address]  Pulmonary Sleep Medicine   Office Visit Note  Patient Name: Bonnie West DOB: 03-30-1951 MRN [id number]  Date of Service: 10/01/2024  Complaints/HPI: Overall she states she is doing well She states she on occasion feels tight. She has no chest ppain no fevers no congestion. Apparently she is having difficulty getting flovent . She has insurance that stated they will not cover. She is not able to tolerate advair apparently.   Office Spirometry Results:     ROS  General: (-) fever, (-) chills, (-) night sweats, (-) weakness Skin: (-) rashes, (-) itching,. Eyes: (-) visual changes, (-) redness, (-) itching. Nose and Sinuses: (-) nasal stuffiness or itchiness, (-) postnasal drip, (-) nosebleeds, (-) sinus trouble. Mouth and Throat: (-) sore throat, (-) hoarseness. Neck: (-) swollen glands, (-) enlarged thyroid , (-) neck pain. Respiratory: - cough, (-) bloody sputum, + shortness of breath, - wheezing. Cardiovascular: - ankle swelling, (-) chest pain. Lymphatic: (-) lymph node enlargement. Neurologic: (-) numbness, (-) tingling. Psychiatric: (-) anxiety, (-) depression   Current Medication: Outpatient Encounter Medications as of 10/01/2024  Medication Sig Note   acetaminophen  (TYLENOL ) 500 MG tablet Take 1,000 mg by mouth 2 (two) times daily as needed for mild pain or moderate pain.    albuterol  (VENTOLIN  HFA) 108 (90 Base) MCG/ACT inhaler Inhale 1-2 puffs into the lungs every 4 (four) hours as needed for wheezing or shortness of breath.    ALPRAZolam  (XANAX ) 0.25 MG tablet Take 1 tablet (0.25 mg total) by mouth 2 (two) times daily as needed for anxiety.    bisacodyl  (DULCOLAX) 5 MG EC tablet Take 5-10 mg by mouth at bedtime as needed for moderate constipation. Two at night    cetirizine  (ZYRTEC ) 10 MG tablet Take 1 tablet (10 mg total) by mouth daily.    cholecalciferol (VITAMIN D ) 1000 units tablet Take 1,000  Units by mouth at bedtime.    clindamycin  (CLEOCIN  T) 1 % external solution Apply 1 Application topically 2 (two) times daily as needed. Bid to buttocks for folliculitis    cyclobenzaprine  (FLEXERIL ) 5 MG tablet Take 1-2 tablets (5-10 mg total) by mouth at bedtime. 04/18/2023: Takes as needed   docusate sodium  (COLACE) 100 MG capsule Take 100-200 mg by mouth at bedtime as needed for mild constipation or moderate constipation.     estradiol  (ESTRACE ) 0.01 % CREA vaginal cream Place 0.5 g vaginally 2 (two) times a week. Place 0.5g nightly for two weeks then twice a week after    ezetimibe  (ZETIA ) 10 MG tablet Take 1 tablet (10 mg total) by mouth daily.    fenofibrate  (TRICOR ) 145 MG tablet Take 1 tablet (145 mg total) by mouth daily.    FLUoxetine  (PROZAC ) 40 MG capsule Take 1 capsule (40 mg total) by mouth daily.    fluticasone  (FLOVENT  HFA) 110 MCG/ACT inhaler USE 2 PUFFS INHALED TWICE DAILY    HYDROcodone -acetaminophen  (NORCO/VICODIN) 5-325 MG tablet Take 1 tablet by mouth every 6 (six) hours as needed for severe pain (pain score 7-10) or moderate pain (pain score 4-6).    HYDROcodone -acetaminophen  (NORCO/VICODIN) 5-325 MG tablet Take 1 tablet by mouth every 6 (six) hours as needed for severe pain (pain score 7-10) or moderate pain (pain score 4-6).    ipratropium (ATROVENT  HFA) 17 MCG/ACT inhaler INHALE 2 PUFFS BY MOUTH 4 TIMES DAILY    ipratropium-albuterol  (DUONEB) 0.5-2.5 (3) MG/3ML SOLN TAKE 3 MLS BY NEBULIZATION EVERY 4 (FOUR) HOURS AS NEEDED (  SOB, WHEEZING).    isometheptene-acetaminophen -dichloralphenazone (MIDRIN) 65-100-325 MG capsule Take 1 capsule by mouth 4 (four) times daily as needed for migraine. Maximum 5 capsules in 12 hours for migraine headaches, 8 capsules in 24 hours for tension headaches.    levothyroxine  (SYNTHROID ) 100 MCG tablet TAKE 1 TABLET BY MOUTH EVERY DAY IN THE MORNING    lisinopril  (ZESTRIL ) 10 MG tablet Take 1 tablet (10 mg total) by mouth daily.    magnesium  oxide  (MAG-OX) 400 MG tablet Take 400 mg by mouth at bedtime.     montelukast  (SINGULAIR ) 10 MG tablet Take 1 tablet (10 mg total) by mouth daily.    mupirocin  ointment (BACTROBAN ) 2 % Apply 1 Application topically daily. Qd to sores on buttocks prn flares    omeprazole  (PRILOSEC) 40 MG capsule TAKE 1 CAPSULE BY MOUTH EVERY DAY    polyethylene glycol powder (GLYCOLAX /MIRALAX ) 17 GM/SCOOP powder Take 17 g by mouth every evening.    triamcinolone  cream (KENALOG ) 0.1 % Apply 1 application topically 2 (two) times daily as needed. For skin breakdown    zolpidem  (AMBIEN ) 5 MG tablet Take 0.5-1 tablets (2.5-5 mg total) by mouth at bedtime as needed for sleep.    No facility-administered encounter medications on file as of 10/01/2024.    Surgical History: Past Surgical History:  Procedure Laterality Date   BUNIONECTOMY     CATARACT EXTRACTION W/PHACO Right 10/11/2016   Procedure: CATARACT EXTRACTION PHACO AND INTRAOCULAR LENS PLACEMENT (IOC);  Surgeon: Elsie Carmine, MD;  Location: [facility] ORS;  Service: Ophthalmology;  Laterality: Right;  Lot# [id number] H US : 00:37.7 AP%: 18.1 CDE: 6.80   CATARACT EXTRACTION W/PHACO Left 11/08/2016   Procedure: CATARACT EXTRACTION PHACO AND INTRAOCULAR LENS PLACEMENT (IOC);  Surgeon: Elsie Carmine, MD;  Location: [facility] ORS;  Service: Ophthalmology;  Laterality: Left;  PACK LOT: [id number] H US :00:32 AP:44 CDE:6.46   COLONOSCOPY     COLONOSCOPY WITH PROPOFOL  N/A 11/26/2015   Procedure: COLONOSCOPY WITH PROPOFOL ;  Surgeon: Lamar ONEIDA Holmes, MD;  Location: [facility]COPY;  Service: Endoscopy;  Laterality: N/A;   COLONOSCOPY WITH PROPOFOL  N/A 03/04/2024   Procedure: COLONOSCOPY WITH PROPOFOL ;  Surgeon: Maryruth Ole ONEIDA, MD;  Location: [facility] ENDOSCOPY;  Service: Endoscopy;  Laterality: N/A;   ESOPHAGOGASTRODUODENOSCOPY (EGD) WITH PROPOFOL  N/A 03/04/2024   Procedure: ESOPHAGOGASTRODUODENOSCOPY (EGD) WITH PROPOFOL ;  Surgeon: Maryruth Ole ONEIDA, MD;  Location: [facility] ENDOSCOPY;   Service: Endoscopy;  Laterality: N/A;   EYE SURGERY  07/23/2021   EYE SURGERY Left 09/24/2021   strabusmis, double vision   TONSILLECTOMY      Medical History: Past Medical History:  Diagnosis Date   Allergic rhinitis    Anxiety    Asthma    Blood transfusion without reported diagnosis    Depression    Diverticulosis    Dyspnea    Esophagitis    GERD (gastroesophageal reflux disease)    Headache    Heart murmur    Hyperlipemia    Hypertension    Hypothyroidism    Obesity    Osteopenia    Palpitations    Pneumothorax    Sleep apnea     Family History: Family History  Problem Relation Age of Onset   Breast cancer Maternal Grandmother    Stroke Maternal Grandmother    Bladder Cancer Father    Prostate cancer Father    Heart attack Father    Aortic aneurysm Father    Congestive Heart Failure Father    Colon cancer Paternal Grandmother    Aneurysm Paternal Grandmother  Stroke Paternal Grandmother    Congestive Heart Failure Paternal Grandmother    Dementia Mother    Stroke Mother    Dementia Maternal Grandfather    Stroke Maternal Grandfather    Dementia Paternal Grandfather    Stroke Paternal Grandfather     Social History: Social History   Socioeconomic History   Marital status: Married    Spouse name: Not on file   Number of children: Not on file   Years of education: Not on file   Highest education level: Not on file  Occupational History   Not on file  Tobacco Use   Smoking status: Former   Smokeless tobacco: Never  Vaping Use   Vaping status: Never Used  Substance and Sexual Activity   Alcohol use: No   Drug use: No   Sexual activity: Not Currently    Partners: Male    Birth control/protection: Post-menopausal  Other Topics Concern   Not on file  Social History Narrative   Not on file   Social Drivers of Health   Financial Resource Strain: Low Risk  (03/23/2021)   Overall Financial Resource Strain (CARDIA)    Difficulty of Paying  Living Expenses: Not very hard  Food Insecurity: No Food Insecurity (04/19/2023)   Hunger Vital Sign    Worried About Running Out of Food in the Last Year: Never true    Ran Out of Food in the Last Year: Never true  Transportation Needs: No Transportation Needs (04/19/2023)   PRAPARE - Administrator, Civil Service (Medical): No    Lack of Transportation (Non-Medical): No  Physical Activity: Not on file  Stress: Not on file  Social Connections: Not on file  Intimate Partner Violence: Not At Risk (04/19/2023)   Humiliation, Afraid, Rape, and Kick questionnaire    Fear of Current or Ex-Partner: No    Emotionally Abused: No    Physically Abused: No    Sexually Abused: No    Vital Signs: Blood pressure 128/78, pulse 77, temperature 97.8 F (36.6 C), resp. rate 16, height 5' 1 (1.549 m), weight 145 lb (65.8 kg), SpO2 95%.  Examination: General Appearance: The patient is well-developed, well-nourished, and in no distress. Skin: Gross inspection of skin unremarkable. Head: normocephalic, no gross deformities. Eyes: no gross deformities noted. ENT: ears appear grossly normal no exudates. Neck: Supple. No thyromegaly. No LAD. Respiratory: no rhonchi noted. Cardiovascular: Normal S1 and S2 without murmur or rub. Extremities: No cyanosis. pulses are equal. Neurologic: Alert and oriented. No involuntary movements.  LABS: Recent Results (from the past 2160 hours)  HSV and VZV PCR Panel     Status: None   Collection Time: 08/08/24 11:47 AM  Result Value Ref Range   Varicella-Zoster, PCR Negative Negative    Comment: No Varicella Zoster Virus DNA detected.   HSV-1 DNA Negative Negative   HSV 2 DNA Negative Negative  POCT Urine Drug Screen     Status: Abnormal   Collection Time: 08/16/24 10:43 AM  Result Value Ref Range   POC Methamphetamine UR None Detected None Detected   POC Opiate Ur None Detected None Detected   POC Barbiturate UR None Detected None Detected   POC  Amphetamine UR None Detected None Detected   POC Oxycodone  UR None Detected None Detected   POC Cocaine UR None Detected None Detected   POC Ecstasy UR None Detected None Detected   POC TRICYCLICS UR None Detected None Detected   POC PHENCYCLIDINE UR None Detected None  Detected   POC Marijuana UR None Detected None Detected   POC Methadone UR None Detected None Detected   POC BENZODIAZEPINES UR Positive (A) None Detected   URINE TEMPERATURE     POC DRUG SCREEN OXIDANTS URINE     POC SPECIFIC GRAVITY URINE     POC PH URINE     Methylenedioxyamphetamine None Detected None Detected  POCT URINALYSIS DIP (CLINITEK)     Status: Normal   Collection Time: 09/09/24  9:37 AM  Result Value Ref Range   Color, UA yellow yellow   Clarity, UA clear clear   Glucose, UA negative negative mg/dL   Bilirubin, UA negative negative   Ketones, POC UA negative negative mg/dL   Spec Grav, UA 8.984 8.989 - 1.025   Blood, UA negative negative   pH, UA 7.0 5.0 - 8.0   POC PROTEIN,UA negative negative, trace   Urobilinogen, UA 0.2 0.2 or 1.0 E.U./dL   Nitrite, UA Negative Negative   Leukocytes, UA Negative Negative    Radiology: No results found.  No results found.  No results found.  Assessment and Plan: Patient Active Problem List   Diagnosis Date Noted   OSA (obstructive sleep apnea) 02/26/2024   CPAP use counseling 02/26/2024   Aortic atherosclerosis 02/01/2024   Coronary artery calcification seen on CT scan 02/01/2024   Complete uterovaginal prolapse 02/01/2024   Asthma exacerbation 04/18/2023   Vaginal atrophy 08/20/2020   Diastolic dysfunction 07/15/2020   OSA on CPAP 07/15/2020   Tightness in chest 05/20/2020   Hypercalcemia 05/20/2020   Primary insomnia 05/20/2020   Acute non-recurrent pansinusitis 02/12/2020   Encounter for general adult medical examination with abnormal findings 11/10/2019   Gastroesophageal reflux disease without esophagitis 11/10/2019   Dysuria 11/10/2019    Acute otitis externa of left ear 07/15/2019   Irritable bowel syndrome with constipation 07/15/2019   Chronic midline low back pain without sciatica 04/27/2019   Allergic rhinitis due to pollen 04/27/2019   Essential hypertension, benign 04/27/2019   GAD (generalized anxiety disorder) 09/01/2018   Chronic migraine without aura without status migrainosus, not intractable 09/01/2018   Encounter for long-term (current) use of medications 09/01/2018   Asthma without status asthmaticus 01/30/2018   Hyperlipidemia, unspecified 01/30/2018   Hypertension 01/30/2018   Obesity, unspecified 01/30/2018   Hypothyroidism 01/30/2018   Fatty infiltration of liver 12/10/2015   Abdominal pain, LLQ (left lower quadrant) 11/03/2015   History of adenomatous polyp of colon 11/03/2015   Hallux valgus, left 09/18/2014   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   Shoulder arthritis 12/19/2013    1. Moderate persistent asthma without status asthmaticus without complication (Primary) She was not able to tolerate Advair prescription for Breo Narcan was given to her - budesonide -formoterol  (BREYNA ) 80-4.5 MCG/ACT inhaler; Inhale 2 puffs into the lungs 2 times daily at 12 noon and 4 pm.  Dispense: 10.3 g; Refill: 3  2. Gastroesophageal reflux disease without esophagitis Controlled acid reflux PPIs as warranted.  3. Allergic rhinitis due to pollen, unspecified seasonality Use nasal corticosteroids as tolerated will continue to follow  General Counseling: I have discussed the findings of the evaluation and examination with Rock.  I have also discussed any further diagnostic evaluation thatmay be needed or ordered today. Zoila verbalizes understanding of the findings of todays visit. We also reviewed her medications today and discussed drug interactions and side effects including but not limited excessive drowsiness and altered mental states. We also discussed that there is always a risk not just to  her but also  people around her. she has been encouraged to call the office with any questions or concerns that should arise related to todays visit.  No orders of the defined types were placed in this encounter.    Time spent: 43  I have personally obtained a history, examined the patient, evaluated laboratory and imaging results, formulated the assessment and plan and placed orders.    Elfreda DELENA Bathe, MD [facility] Pulmonary and Critical Care Sleep medicine

## 2024-10-01 NOTE — Patient Instructions (Signed)
 Asthma, Adult  Asthma is a condition that causes swelling and narrowing of the airways. These are the passages that lead from the nose and mouth down into the lungs. When asthma symptoms get worse it is called an asthma attack or flare. This can make it hard to breathe. Asthma flares can range from minor to life-threatening. There is no cure for asthma, but medicines and lifestyle changes can help to control it. What are the causes? It is not known exactly what causes asthma, but certain things can cause asthma symptoms to get worse (triggers). What can trigger an asthma attack? Cigarette smoke. Mold. Dust. Your pet's skin flakes (dander). Cockroaches. Pollen. Air pollution (like household cleaners, wood smoke, smog, or Therapist, occupational). What are the signs or symptoms? Trouble breathing (shortness of breath). Coughing. Making high-pitched whistling sounds when you breathe, most often when you breathe out (wheezing). Chest tightness. Tiredness with little activity. Poor exercise tolerance. How is this treated? Controller medicines that help prevent asthma symptoms. Fast-acting reliever or rescue medicines. These give short-term relief of asthma symptoms. Allergy medicines if your attacks are brought on by allergens. Medicines to help control the body's defense (immune) system. Staying away from the things that cause asthma attacks. Follow these instructions at home: Avoiding triggers in your home Do not allow anyone to smoke in your home. Limit use of fireplaces and wood stoves. Get rid of pests (such as roaches and mice) and their droppings. Keep your home clean. Clean your floors. Dust regularly. Use cleaning products that do not smell. Wash bed sheets and blankets every week in hot water. Dry them in a dryer. Have someone vacuum when you are not home. Change your heating and air conditioning filters often. Use blankets that are made of polyester or cotton. General  instructions Take over-the-counter and prescription medicines only as told by your doctor. Do not smoke or use any products that contain nicotine or tobacco. If you need help quitting, ask your doctor. Stay away from secondhand smoke. Avoid doing things outdoors when allergen counts are high and when air quality is low. Warm up before you exercise. Take time to cool down after exercise. Use a peak flow meter as told by your doctor. A peak flow meter is a tool that measures how well your lungs are working. Keep track of the peak flow meter's readings. Write them down. Follow your asthma action plan. This is a written plan for taking care of your asthma and treating your attacks. Make sure you get all the shots (vaccines) that your doctor recommends. Ask your doctor about a flu shot and a pneumonia shot. Keep all follow-up visits. Contact a doctor if: You have wheezing, shortness of breath, or a cough even while taking medicine to prevent attacks. The mucus you cough up (sputum) is thicker than usual. The mucus you cough up changes from clear or white to yellow, green, gray, or is bloody. You have problems from the medicine you are taking, such as: A rash. Itching. Swelling. Trouble breathing. You need reliever medicines more than 2-3 times a week. Your peak flow reading is still at 50-79% of your personal best after following the action plan for 1 hour. You have a fever. Get help right away if: You seem to be worse and are not responding to medicine during an asthma attack. You are short of breath even at rest. You get short of breath when doing very little activity. You have trouble eating, drinking, or talking. You have chest  pain or tightness. You have a fast heartbeat. Your lips or fingernails start to turn blue. You are light-headed or dizzy, or you faint. Your peak flow is less than 50% of your personal best. You feel too tired to breathe normally. These symptoms may be an  emergency. Get help right away. Call 911. Do not wait to see if the symptoms will go away. Do not drive yourself to the hospital. Summary Asthma is a long-term (chronic) condition in which the airways get tight and narrow. An asthma attack can make it hard to breathe. Asthma cannot be cured, but medicines and lifestyle changes can help control it. Make sure you understand how to avoid triggers and how and when to use your medicines. Avoid things that can cause allergy symptoms (allergens). These include animal skin flakes (dander) and pollen from trees or grass. Avoid things that pollute the air. These may include household cleaners, wood smoke, smog, or chemical odors. This information is not intended to replace advice given to you by your health care provider. Make sure you discuss any questions you have with your health care provider. Document Revised: 08/16/2021 Document Reviewed: 08/16/2021 Elsevier Patient Education  2024 [employer].

## 2024-10-04 ENCOUNTER — Telehealth: Payer: Self-pay

## 2024-10-04 NOTE — Telephone Encounter (Signed)
 Attempted to contact patient. I left a message with patient Husband. Also attempted to call her cell phone but no answer and voicemail is full.

## 2024-10-14 ENCOUNTER — Encounter: Payer: Self-pay | Admitting: Obstetrics and Gynecology

## 2024-10-14 ENCOUNTER — Ambulatory Visit (INDEPENDENT_AMBULATORY_CARE_PROVIDER_SITE_OTHER): Admitting: Obstetrics and Gynecology

## 2024-10-14 DIAGNOSIS — N3941 Urge incontinence: Secondary | ICD-10-CM

## 2024-10-14 DIAGNOSIS — N812 Incomplete uterovaginal prolapse: Secondary | ICD-10-CM

## 2024-10-14 NOTE — Progress Notes (Signed)
 [facility] Return Visit  SUBJECTIVE  History of Present Illness: RACQUEL ARKIN is a 73 y.o. female seen in follow-up for Stage III prolapse and incontinence.   She thinks she may be interested in surgery. She previously had several RWS pessaries. Not sure she wants another pessary because she cannot remove it herself. She wants the option to remain sexually active.   She mostly has incontinence with a full bladder and urgency. Does not really have SUI.   Past Medical History: Patient  has a past medical history of Allergic rhinitis, Anxiety, Asthma, Blood transfusion without reported diagnosis, Depression, Diverticulosis, Dyspnea, Esophagitis, GERD (gastroesophageal reflux disease), Headache, Heart murmur, Hyperlipemia, Hypertension, Hypothyroidism, Obesity, Osteopenia, Palpitations, Pneumothorax, and Sleep apnea.   Past Surgical History: She  has a past surgical history that includes Tonsillectomy; Colonoscopy; Bunionectomy; Colonoscopy with propofol  (N/A, 11/26/2015); Cataract extraction w/PHACO (Right, 10/11/2016); Eye surgery (07/23/2021); Cataract extraction w/PHACO (Left, 11/08/2016); Eye surgery (Left, 09/24/2021); Colonoscopy with propofol  (N/A, 03/04/2024); and Esophagogastroduodenoscopy (egd) with propofol  (N/A, 03/04/2024).   Medications: She has a current medication list which includes the following prescription(s): acetaminophen , albuterol , alprazolam , bisacodyl , breyna , cetirizine , cholecalciferol, clindamycin , cyclobenzaprine , docusate sodium , estradiol , ezetimibe , fenofibrate , fluoxetine , hydrocodone -acetaminophen , hydrocodone -acetaminophen , atrovent  hfa, ipratropium-albuterol , isometheptene-acetaminophen -dichloralphenazone, levothyroxine , lisinopril , magnesium  oxide, montelukast , mupirocin  ointment, omeprazole , polyethylene glycol powder, triamcinolone  cream, and zolpidem .   Allergies: Patient is allergic to salmeterol, codeine, erythromycin, morphine and codeine,  nsaids, and sulfa antibiotics.   Social History: Patient  reports that she has quit smoking. She has never used smokeless tobacco. She reports that she does not drink alcohol and does not use drugs.     OBJECTIVE     Physical Exam: There were no vitals filed for this visit. Gen: No apparent distress, A&O x 3.  Detailed Urogynecologic Evaluation:  Deferred. Prior exam showed:  POP-Q   0                                            Aa   0                                           Ba   3                                              C    3.5                                            Gh   4                                            Pb   9                                            tvl    0  Ap   0                                            Bp   -6                                              D        ASSESSMENT AND PLAN    Ms. Vasconcelos is a 73 y.o. with:  1. Uterovaginal prolapse, incomplete   2. Urge incontinence     - We discussed two options for prolapse repair:  1) vaginal repair without mesh- with or without hysterectomy, also discussed colpocleisis - Pros - safer, no mesh complications - Cons - not as strong as mesh repair, higher risk of recurrence  2) laparoscopic repair with mesh- Sacrocolpopexy - Pros - stronger, better long-term success - Cons - risks of mesh implant (erosion into vagina or bladder, adhering to the rectum, pain) - these risks are lower than with a vaginal mesh but still exist - Also discussed expectant management.  - She is concerned about mesh and prefers native tissue repair. She is deciding between hysterectomy vs hysteropexy with prolapse repair - Will have her undergo urodynamic testing to assess leakage further so we can discuss options for SUI treatment if needed.  - Will need pulmonary clearance prior to surgery   Rosaline LOISE Caper, MD

## 2024-10-14 NOTE — Patient Instructions (Signed)

## 2024-10-16 ENCOUNTER — Other Ambulatory Visit: Payer: Self-pay | Admitting: Internal Medicine

## 2024-10-16 DIAGNOSIS — J4540 Moderate persistent asthma, uncomplicated: Secondary | ICD-10-CM

## 2024-10-22 ENCOUNTER — Other Ambulatory Visit: Payer: Self-pay

## 2024-10-22 ENCOUNTER — Telehealth: Payer: Self-pay

## 2024-10-22 MED ORDER — BUDESONIDE-FORMOTEROL FUMARATE 80-4.5 MCG/ACT IN AERO: 2.0000 | INHALATION_SPRAY | Freq: Two times a day (BID) | RESPIRATORY_TRACT | 3 refills | Status: DC

## 2024-10-22 NOTE — Telephone Encounter (Signed)
 Faxed patient's P.A. form for Symbicort .

## 2024-10-28 ENCOUNTER — Other Ambulatory Visit: Payer: Self-pay | Admitting: Internal Medicine

## 2024-10-28 ENCOUNTER — Other Ambulatory Visit: Payer: Self-pay

## 2024-10-28 MED ORDER — AIRSUPRA 90-80 MCG/ACT IN AERO: 90.0000 ug | INHALATION_SPRAY | Freq: Four times a day (QID) | RESPIRATORY_TRACT | 3 refills | Status: DC | PRN

## 2024-10-30 ENCOUNTER — Other Ambulatory Visit: Payer: Self-pay

## 2024-10-30 MED ORDER — BREYNA 80-4.5 MCG/ACT IN AERO: 2.0000 | INHALATION_SPRAY | Freq: Every day | RESPIRATORY_TRACT | 3 refills | Status: AC

## 2024-11-11 ENCOUNTER — Ambulatory Visit (INDEPENDENT_AMBULATORY_CARE_PROVIDER_SITE_OTHER): Payer: Medicare Other | Admitting: Nurse Practitioner

## 2024-11-11 ENCOUNTER — Encounter: Payer: Self-pay | Admitting: Nurse Practitioner

## 2024-11-11 VITALS — BP 130/86 | HR 78 | Temp 96.9°F | Resp 16 | Ht 61.0 in | Wt 146.4 lb

## 2024-11-11 DIAGNOSIS — N813 Complete uterovaginal prolapse: Secondary | ICD-10-CM | POA: Diagnosis not present

## 2024-11-11 DIAGNOSIS — F411 Generalized anxiety disorder: Secondary | ICD-10-CM | POA: Diagnosis not present

## 2024-11-11 DIAGNOSIS — Z0001 Encounter for general adult medical examination with abnormal findings: Secondary | ICD-10-CM | POA: Diagnosis not present

## 2024-11-11 DIAGNOSIS — E538 Deficiency of other specified B group vitamins: Secondary | ICD-10-CM | POA: Diagnosis not present

## 2024-11-11 DIAGNOSIS — G8929 Other chronic pain: Secondary | ICD-10-CM | POA: Diagnosis not present

## 2024-11-11 DIAGNOSIS — Z1231 Encounter for screening mammogram for malignant neoplasm of breast: Secondary | ICD-10-CM | POA: Diagnosis not present

## 2024-11-11 DIAGNOSIS — R300 Dysuria: Secondary | ICD-10-CM | POA: Diagnosis not present

## 2024-11-11 DIAGNOSIS — E782 Mixed hyperlipidemia: Secondary | ICD-10-CM

## 2024-11-11 DIAGNOSIS — Z79899 Other long term (current) drug therapy: Secondary | ICD-10-CM

## 2024-11-11 DIAGNOSIS — J4540 Moderate persistent asthma, uncomplicated: Secondary | ICD-10-CM | POA: Diagnosis not present

## 2024-11-11 DIAGNOSIS — M545 Low back pain, unspecified: Secondary | ICD-10-CM | POA: Diagnosis not present

## 2024-11-11 DIAGNOSIS — E559 Vitamin D deficiency, unspecified: Secondary | ICD-10-CM

## 2024-11-11 DIAGNOSIS — I700 Atherosclerosis of aorta: Secondary | ICD-10-CM

## 2024-11-11 DIAGNOSIS — I10 Essential (primary) hypertension: Secondary | ICD-10-CM | POA: Diagnosis not present

## 2024-11-11 DIAGNOSIS — I5032 Chronic diastolic (congestive) heart failure: Secondary | ICD-10-CM | POA: Diagnosis not present

## 2024-11-11 MED ORDER — ALPRAZOLAM 0.25 MG PO TABS: 0.2500 mg | ORAL_TABLET | Freq: Two times a day (BID) | ORAL | 2 refills | Status: AC | PRN

## 2024-11-11 MED ORDER — HYDROCODONE-ACETAMINOPHEN 5-325 MG PO TABS: 1.0000 | ORAL_TABLET | Freq: Four times a day (QID) | ORAL | 0 refills | Status: AC | PRN

## 2024-11-11 NOTE — Progress Notes (Signed)
 [facility] [address] [place], [place] [address]  Internal MEDICINE  Office Visit Note  Patient Name: Bonnie West  [id number]  [id number]  Date of Service: 11/11/2024  Chief Complaint  Patient presents with   Depression   Gastroesophageal Reflux   Hyperlipidemia   Hypertension   Medicare Wellness    HPI Tyrah presents for an annual well visit and physical exam.  Well-appearing 73 y.o. female with  hypertension, migraines, asthma, OSA, IBS, GERD, hypothyroidism, osteoarthritis, fatty liver, and GAD.   Routine CRC screening: sees GI, due in 2027 Routine mammogram: overdue now  DEXA scan: done in 2018 Labs: due for routine labs  New or worsening pain: chronic back pain  Other concerns:     11/11/2024   11:05 AM 11/09/2023   11:17 AM 11/08/2022   11:07 AM  MMSE - Mini Mental State Exam  Orientation to time 5 5 5   Orientation to Place 5 5 5   Registration 3 3 3   Attention/ Calculation 5 5 5   Recall 3 3 3   Language- name 2 objects 2 2 2   Language- repeat 1 1 1   Language- follow 3 step command 3 3 3   Language- read & follow direction 1 1 1   Write a sentence 1 1 1   Copy design 1 1 1   Total score 30 30 30     Functional Status Survey: Is the patient deaf or have difficulty hearing?: Yes Does the patient have difficulty seeing, even when wearing glasses/contacts?: No Does the patient have difficulty concentrating, remembering, or making decisions?: No Does the patient have difficulty walking or climbing stairs?: No Does the patient have difficulty dressing or bathing?: No Does the patient have difficulty doing errands alone such as visiting a doctor's office or shopping?: No     01/30/2023   11:28 AM 11/09/2023   11:15 AM 02/01/2024    2:09 PM 05/15/2024    3:37 PM 11/11/2024   11:04 AM  Fall Risk  Falls in the past year? 0 1 0 0 0  Was there an injury with Fall? 0  0    0  Fall Risk Category Calculator 0 2   0  Patient at Risk for Falls Due to  No Fall Risks  History of fall(s)    Fall risk Follow up Falls evaluation completed Falls evaluation completed Falls evaluation completed  Falls evaluation completed     Data saved with a previous flowsheet row definition       02/01/2024    2:10 PM  Depression screen PHQ 2/9  Decreased Interest 1  Down, Depressed, Hopeless 0  PHQ - 2 Score 1      Current Medication: Outpatient Encounter Medications as of 11/11/2024  Medication Sig Note   acetaminophen  (TYLENOL ) 500 MG tablet Take 1,000 mg by mouth 2 (two) times daily as needed for mild pain or moderate pain.    albuterol  (VENTOLIN  HFA) 108 (90 Base) MCG/ACT inhaler Inhale 1-2 puffs into the lungs every 4 (four) hours as needed for wheezing or shortness of breath.    albuterol  (VENTOLIN  HFA) 108 (90 Base) MCG/ACT inhaler Inhale 1-2 puffs into the lungs every 6 (six) hours as needed for wheezing or shortness of breath.    ALPRAZolam  (XANAX ) 0.25 MG tablet Take 1 tablet (0.25 mg total) by mouth 2 (two) times daily as needed for anxiety.    bisacodyl  (DULCOLAX) 5 MG EC tablet Take 5-10 mg by mouth at bedtime as needed for moderate constipation. Two at  night    budesonide -formoterol  (BREYNA ) 80-4.5 MCG/ACT inhaler Inhale 2 puffs into the lungs daily.    cetirizine  (ZYRTEC ) 10 MG tablet Take 1 tablet (10 mg total) by mouth daily.    cholecalciferol (VITAMIN D ) 1000 units tablet Take 1,000 Units by mouth at bedtime.    clindamycin  (CLEOCIN  T) 1 % external solution Apply 1 Application topically 2 (two) times daily as needed. Bid to buttocks for folliculitis    cyclobenzaprine  (FLEXERIL ) 5 MG tablet Take 1-2 tablets (5-10 mg total) by mouth at bedtime. 04/18/2023: Takes as needed   docusate sodium  (COLACE) 100 MG capsule Take 100-200 mg by mouth at bedtime as needed for mild constipation or moderate constipation.     estradiol  (ESTRACE ) 0.01 % CREA vaginal cream Place 0.5 g vaginally 2 (two) times a week. Place 0.5g nightly for two weeks then  twice a week after    ezetimibe  (ZETIA ) 10 MG tablet Take 1 tablet (10 mg total) by mouth daily.    fenofibrate  (TRICOR ) 145 MG tablet Take 1 tablet (145 mg total) by mouth daily.    FLUoxetine  (PROZAC ) 40 MG capsule Take 1 capsule (40 mg total) by mouth daily.    HYDROcodone -acetaminophen  (NORCO/VICODIN) 5-325 MG tablet Take 1 tablet by mouth every 6 (six) hours as needed for severe pain (pain score 7-10) or moderate pain (pain score 4-6).    HYDROcodone -acetaminophen  (NORCO/VICODIN) 5-325 MG tablet Take 1 tablet by mouth every 6 (six) hours as needed for severe pain (pain score 7-10) or moderate pain (pain score 4-6).    ipratropium (ATROVENT  HFA) 17 MCG/ACT inhaler INHALE 2 PUFFS BY MOUTH 4 TIMES DAILY    isometheptene-acetaminophen -dichloralphenazone (MIDRIN) 65-100-325 MG capsule Take 1 capsule by mouth 4 (four) times daily as needed for migraine. Maximum 5 capsules in 12 hours for migraine headaches, 8 capsules in 24 hours for tension headaches.    levothyroxine  (SYNTHROID ) 100 MCG tablet TAKE 1 TABLET BY MOUTH EVERY DAY IN THE MORNING    lisinopril  (ZESTRIL ) 10 MG tablet Take 1 tablet (10 mg total) by mouth daily.    magnesium  oxide (MAG-OX) 400 MG tablet Take 400 mg by mouth at bedtime.     montelukast  (SINGULAIR ) 10 MG tablet Take 1 tablet (10 mg total) by mouth daily.    mupirocin  ointment (BACTROBAN ) 2 % Apply 1 Application topically daily. Qd to sores on buttocks prn flares    omeprazole  (PRILOSEC) 40 MG capsule TAKE 1 CAPSULE BY MOUTH EVERY DAY    polyethylene glycol powder (GLYCOLAX /MIRALAX ) 17 GM/SCOOP powder Take 17 g by mouth every evening.    triamcinolone  cream (KENALOG ) 0.1 % Apply 1 application topically 2 (two) times daily as needed. For skin breakdown    zolpidem  (AMBIEN ) 5 MG tablet Take 0.5-1 tablets (2.5-5 mg total) by mouth at bedtime as needed for sleep.    [DISCONTINUED] albuterol  (VENTOLIN  HFA) 108 (90 Base) MCG/ACT inhaler Inhale 1-2 puffs into the lungs every 4 (four)  hours as needed for wheezing or shortness of breath.    [DISCONTINUED] ALPRAZolam  (XANAX ) 0.25 MG tablet Take 1 tablet (0.25 mg total) by mouth 2 (two) times daily as needed for anxiety.    [DISCONTINUED] ALPRAZolam  (XANAX ) 0.25 MG tablet Take 1 tablet (0.25 mg total) by mouth 2 (two) times daily as needed for anxiety.    [DISCONTINUED] cetirizine  (ZYRTEC ) 10 MG tablet Take 1 tablet (10 mg total) by mouth daily.    [DISCONTINUED] clindamycin  (CLEOCIN  T) 1 % external solution Apply 1 Application topically 2 (two) times  daily as needed. For skin breakdown    [DISCONTINUED] estradiol  (ESTRACE ) 0.1 MG/GM vaginal cream INSERT 1 GRAM VAGINALLY TWICE WEEKLY AT BEDTIME (Patient not taking: Reported on 08/08/2024)    [DISCONTINUED] fenofibrate  (TRICOR ) 145 MG tablet Take 1 tablet (145 mg total) by mouth daily.    [DISCONTINUED] FLUoxetine  (PROZAC ) 40 MG capsule Take 1 capsule (40 mg total) by mouth daily.    [DISCONTINUED] fluticasone  (FLOVENT  HFA) 110 MCG/ACT inhaler USE 2 PUFFS INHALED TWICE DAILY    [DISCONTINUED] HYDROcodone -acetaminophen  (NORCO/VICODIN) 5-325 MG tablet Take 1 tablet by mouth every 6 (six) hours as needed for severe pain (pain score 7-10) or moderate pain (pain score 4-6).    [DISCONTINUED] HYDROcodone -acetaminophen  (NORCO/VICODIN) 5-325 MG tablet Take 1 tablet by mouth every 6 (six) hours as needed for severe pain (pain score 7-10) or moderate pain (pain score 4-6).    [DISCONTINUED] HYDROcodone -acetaminophen  (NORCO/VICODIN) 5-325 MG tablet Take 1 tablet by mouth every 6 (six) hours as needed for severe pain (pain score 7-10) or moderate pain (pain score 4-6).    [DISCONTINUED] ipratropium (ATROVENT  HFA) 17 MCG/ACT inhaler INHALE 2 PUFFS 4 TIMES DAILY    [DISCONTINUED] ipratropium-albuterol  (DUONEB) 0.5-2.5 (3) MG/3ML SOLN TAKE 3 MLS BY NEBULIZATION EVERY 4 (FOUR) HOURS AS NEEDED (SOB, WHEEZING).    [DISCONTINUED] lisinopril  (ZESTRIL ) 10 MG tablet Take 1 tablet (10 mg total) by mouth daily.     [DISCONTINUED] montelukast  (SINGULAIR ) 10 MG tablet TAKE 1 TABLET BY MOUTH EVERY DAY    [DISCONTINUED] omeprazole  (PRILOSEC) 40 MG capsule TAKE 1 CAPSULE BY MOUTH EVERY DAY    [DISCONTINUED] vitamin B-12 (CYANOCOBALAMIN) 1000 MCG tablet Take 1,000 mcg by mouth daily.    No facility-administered encounter medications on file as of 11/11/2024.    Surgical History: Past Surgical History:  Procedure Laterality Date   BUNIONECTOMY     CATARACT EXTRACTION W/PHACO Right 10/11/2016   Procedure: CATARACT EXTRACTION PHACO AND INTRAOCULAR LENS PLACEMENT (IOC);  Surgeon: Elsie Carmine, MD;  Location: [facility] ORS;  Service: Ophthalmology;  Laterality: Right;  Lot# [id number] H US : 00:37.7 AP%: 18.1 CDE: 6.80   CATARACT EXTRACTION W/PHACO Left 11/08/2016   Procedure: CATARACT EXTRACTION PHACO AND INTRAOCULAR LENS PLACEMENT (IOC);  Surgeon: Elsie Carmine, MD;  Location: [facility] ORS;  Service: Ophthalmology;  Laterality: Left;  PACK LOT: [id number] H US :00:32 AP:44 CDE:6.46   COLONOSCOPY     COLONOSCOPY WITH PROPOFOL  N/A 11/26/2015   Procedure: COLONOSCOPY WITH PROPOFOL ;  Surgeon: Lamar ONEIDA Holmes, MD;  Location: [facility] ENDOSCOPY;  Service: Endoscopy;  Laterality: N/A;   COLONOSCOPY WITH PROPOFOL  N/A 03/04/2024   Procedure: COLONOSCOPY WITH PROPOFOL ;  Surgeon: Maryruth Ole ONEIDA, MD;  Location: [facility] ENDOSCOPY;  Service: Endoscopy;  Laterality: N/A;   ESOPHAGOGASTRODUODENOSCOPY (EGD) WITH PROPOFOL  N/A 03/04/2024   Procedure: ESOPHAGOGASTRODUODENOSCOPY (EGD) WITH PROPOFOL ;  Surgeon: Maryruth Ole ONEIDA, MD;  Location: [facility] ENDOSCOPY;  Service: Endoscopy;  Laterality: N/A;   EYE SURGERY  07/23/2021   EYE SURGERY Left 09/24/2021   strabusmis, double vision   TONSILLECTOMY      Medical History: Past Medical History:  Diagnosis Date   Allergic rhinitis    Anxiety    Asthma    Blood transfusion without reported diagnosis    Depression    Diverticulosis    Dyspnea    Esophagitis    GERD  (gastroesophageal reflux disease)    Headache    Heart murmur    Hyperlipemia    Hypertension    Hypothyroidism    Obesity    Osteopenia    Palpitations  Pneumothorax    Sleep apnea     Family History: Family History  Problem Relation Age of Onset   Breast cancer Maternal Grandmother    Stroke Maternal Grandmother    Bladder Cancer Father    Prostate cancer Father    Heart attack Father    Aortic aneurysm Father    Congestive Heart Failure Father    Colon cancer Paternal Grandmother    Aneurysm Paternal Grandmother    Stroke Paternal Grandmother    Congestive Heart Failure Paternal Grandmother    Dementia Mother    Stroke Mother    Dementia Maternal Grandfather    Stroke Maternal Grandfather    Dementia Paternal Grandfather    Stroke Paternal Grandfather     Social History   Socioeconomic History   Marital status: Married    Spouse name: Not on file   Number of children: Not on file   Years of education: Not on file   Highest education level: Not on file  Occupational History   Not on file  Tobacco Use   Smoking status: Former   Smokeless tobacco: Never  Vaping Use   Vaping status: Never Used  Substance and Sexual Activity   Alcohol use: No   Drug use: No   Sexual activity: Not Currently    Partners: Male    Birth control/protection: Post-menopausal  Other Topics Concern   Not on file  Social History Narrative   Not on file   Social Drivers of Health   Tobacco Use: Medium Risk (11/11/2024)   Patient History    Smoking Tobacco Use: Former    Smokeless Tobacco Use: Never    Passive Exposure: Not on Actuary Strain: Not on file  Food Insecurity: No Food Insecurity (04/19/2023)   Hunger Vital Sign    Worried About Running Out of Food in the Last Year: Never true    Ran Out of Food in the Last Year: Never true  Transportation Needs: No Transportation Needs (04/19/2023)   PRAPARE - Administrator, Civil Service (Medical):  No    Lack of Transportation (Non-Medical): No  Physical Activity: Not on file  Stress: Not on file  Social Connections: Not on file  Intimate Partner Violence: Not At Risk (04/19/2023)   Humiliation, Afraid, Rape, and Kick questionnaire    Fear of Current or Ex-Partner: No    Emotionally Abused: No    Physically Abused: No    Sexually Abused: No  Depression (PHQ2-9): Low Risk (02/01/2024)   Depression (PHQ2-9)    PHQ-2 Score: 1  Alcohol Screen: Low Risk (08/04/2022)   Alcohol Screen    Last Alcohol Screening Score (AUDIT): 0  Housing: Patient Declined (04/19/2023)   Housing    Last Housing Risk Score: 0  Utilities: Not At Risk (04/19/2023)   AHC Utilities    Threatened with loss of utilities: No  Health Literacy: Not on file      Review of Systems  Constitutional:  Negative for activity change, appetite change, chills, fatigue, fever and unexpected weight change.  HENT: Negative.  Negative for congestion, ear pain, rhinorrhea, sore throat and trouble swallowing.   Eyes: Negative.   Respiratory: Negative.  Negative for cough, chest tightness, shortness of breath and wheezing.   Cardiovascular: Negative.  Negative for chest pain.  Gastrointestinal: Negative.  Negative for abdominal pain, blood in stool, constipation, diarrhea, nausea and vomiting.  Endocrine: Negative.   Genitourinary: Negative.  Negative for difficulty urinating, dysuria, frequency, hematuria  and urgency.  Musculoskeletal: Negative.  Negative for arthralgias, back pain, joint swelling, myalgias and neck pain.  Skin: Negative.  Negative for rash and wound.  Allergic/Immunologic: Negative.  Negative for immunocompromised state.  Neurological: Negative.  Negative for dizziness, seizures, numbness and headaches.  Hematological: Negative.   Psychiatric/Behavioral:  Positive for depression. Negative for behavioral problems, self-injury and suicidal ideas. The patient is not nervous/anxious.     Vital Signs: BP  130/86   Pulse 78   Temp (!) 96.9 F (36.1 C)   Resp 16   Ht 5' 1 (1.549 m)   Wt 146 lb 6.4 oz (66.4 kg)   SpO2 98%   BMI 27.66 kg/m    Physical Exam Vitals reviewed.  Constitutional:      General: She is not in acute distress.    Appearance: Normal appearance. She is well-developed and normal weight. She is not ill-appearing or diaphoretic.  HENT:     Head: Normocephalic and atraumatic.     Right Ear: Tympanic membrane, ear canal and external ear normal.     Left Ear: Tympanic membrane, ear canal and external ear normal.     Nose: Nose normal. No congestion or rhinorrhea.     Mouth/Throat:     Mouth: Mucous membranes are moist.     Pharynx: Oropharynx is clear. No oropharyngeal exudate or posterior oropharyngeal erythema.  Eyes:     General: No scleral icterus.       Right eye: No discharge.        Left eye: No discharge.     Extraocular Movements: Extraocular movements intact.     Conjunctiva/sclera: Conjunctivae normal.     Pupils: Pupils are equal, round, and reactive to light.  Neck:     Thyroid : No thyromegaly.     Vascular: No JVD.     Trachea: No tracheal deviation.  Cardiovascular:     Rate and Rhythm: Normal rate and regular rhythm.     Pulses: Normal pulses.     Heart sounds: Normal heart sounds. No murmur heard.    No friction rub. No gallop.  Pulmonary:     Effort: Pulmonary effort is normal. No respiratory distress.     Breath sounds: Normal breath sounds. No stridor. No wheezing or rales.  Chest:     Chest wall: No tenderness.     Comments: Declined clinical breast exam, routine mammogram ordered.  Abdominal:     General: Bowel sounds are normal. There is no distension.     Palpations: Abdomen is soft. There is no mass.     Tenderness: There is no abdominal tenderness. There is no guarding or rebound.  Musculoskeletal:        General: No tenderness or deformity. Normal range of motion.     Cervical back: Normal range of motion and neck supple.   Lymphadenopathy:     Cervical: No cervical adenopathy.  Skin:    General: Skin is warm and dry.     Coloration: Skin is not pale.     Findings: No erythema or rash.  Neurological:     Mental Status: She is alert and oriented to person, place, and time.     Cranial Nerves: No cranial nerve deficit.     Motor: No abnormal muscle tone.     Coordination: Coordination normal.     Gait: Gait normal.     Deep Tendon Reflexes: Reflexes are normal and symmetric.  Psychiatric:        Mood and Affect:  Mood normal.        Behavior: Behavior normal.        Thought Content: Thought content normal.        Judgment: Judgment normal.        Assessment/Plan: 1. Encounter for Medicare annual examination with abnormal findings (Primary) Age-appropriate preventive screenings and vaccinations discussed. Routine labs for health maintenance ordered, see below. PHM updated.   - CBC with Differential/Platelet - CMP14+EGFR - Lipid Profile - TSH + free T4 - Vitamin D  (25 hydroxy) - B12 and Folate Panel  2. Moderate persistent asthma without status asthmaticus without complication Sees Dr. Elfreda Bathe and was recently switched to breyna  inhaler.   3. Chronic diastolic (congestive) heart failure (HCC) Continue medications as prescribed.   4. Essential hypertension, benign Stable, continue medications as prescribed. Routine labs ordered. - CBC with Differential/Platelet - CMP14+EGFR - Lipid Profile - TSH + free T4  5. Aortic atherosclerosis Routine labs ordered  - CBC with Differential/Platelet - CMP14+EGFR - Lipid Profile - TSH + free T4  6. Complete uterovaginal prolapse Referred to gynecology at [facility] for second opinion.  - Ambulatory referral to Obstetrics / Gynecology  7. Chronic midline low back pain without sciatica Continue prn hydrocodone  as prescribed. Follow up in 3 months for additional refills.  - HYDROcodone -acetaminophen  (NORCO/VICODIN) 5-325 MG tablet; Take 1  tablet by mouth every 6 (six) hours as needed for severe pain (pain score 7-10) or moderate pain (pain score 4-6).  Dispense: 60 tablet; Refill: 0 - HYDROcodone -acetaminophen  (NORCO/VICODIN) 5-325 MG tablet; Take 1 tablet by mouth every 6 (six) hours as needed for severe pain (pain score 7-10) or moderate pain (pain score 4-6).  Dispense: 60 tablet; Refill: 0  8. B12 deficiency Routine labs ordered.  - CBC with Differential/Platelet - B12 and Folate Panel  9. Vitamin D  deficiency Routine lab ordered.  - Vitamin D  (25 hydroxy)  10. Dysuria Urine sent to lab.  - UA/M w/rflx Culture, Routine - Microscopic Examination  11. Encounter for screening mammogram for malignant neoplasm of breast Routine mammogram ordered.  - MM 3D SCREENING MAMMOGRAM BILATERAL BREAST; Future  12. GAD (generalized anxiety disorder) Continue alprazolam  as prescribed.  - ALPRAZolam  (XANAX ) 0.25 MG tablet; Take 1 tablet (0.25 mg total) by mouth 2 (two) times daily as needed for anxiety.  Dispense: 60 tablet; Refill: 2     General Counseling: Hadlei verbalizes understanding of the findings of todays visit and agrees with plan of treatment. I have discussed any further diagnostic evaluation that may be needed or ordered today. We also reviewed her medications today. she has been encouraged to call the office with any questions or concerns that should arise related to todays visit.    Orders Placed This Encounter  Procedures   MM 3D SCREENING MAMMOGRAM BILATERAL BREAST   CBC with Differential/Platelet   CMP14+EGFR   Lipid Profile   TSH + free T4   Vitamin D  (25 hydroxy)   B12 and Folate Panel   Ambulatory referral to Obstetrics / Gynecology    Meds ordered this encounter  Medications   ALPRAZolam  (XANAX ) 0.25 MG tablet    Sig: Take 1 tablet (0.25 mg total) by mouth 2 (two) times daily as needed for anxiety.    Dispense:  60 tablet    Refill:  2    For future refills, F41.0 dx code    HYDROcodone -acetaminophen  (NORCO/VICODIN) 5-325 MG tablet    Sig: Take 1 tablet by mouth every 6 (six) hours as needed  for severe pain (pain score 7-10) or moderate pain (pain score 4-6).    Dispense:  60 tablet    Refill:  0    For refill now   HYDROcodone -acetaminophen  (NORCO/VICODIN) 5-325 MG tablet    Sig: Take 1 tablet by mouth every 6 (six) hours as needed for severe pain (pain score 7-10) or moderate pain (pain score 4-6).    Dispense:  60 tablet    Refill:  0    For refill    Return in about 3 months (around 02/09/2025) for F/U, anxiety med refill, pain med refill, Labs, [place]way PCP.   Total time spent:30 Minutes Time spent includes review of chart, medications, test results, and follow up plan with the patient.   [place] Controlled Substance Database was reviewed by me.  This patient was seen by Mardy Maxin, FNP-C in collaboration with Dr. Sigrid Bathe as a part of collaborative care agreement.  Ramie Erman R. Maxin, MSN, FNP-C Internal medicine

## 2024-11-12 LAB — MICROSCOPIC EXAMINATION
Bacteria, UA: NONE SEEN
Casts: NONE SEEN /LPF
RBC, Urine: NONE SEEN /HPF (ref 0–2)
WBC, UA: NONE SEEN /HPF (ref 0–5)

## 2024-11-12 LAB — UA/M W/RFLX CULTURE, ROUTINE
Bilirubin, UA: NEGATIVE
Glucose, UA: NEGATIVE
Ketones, UA: NEGATIVE
Leukocytes,UA: NEGATIVE
Nitrite, UA: NEGATIVE
Protein,UA: NEGATIVE
RBC, UA: NEGATIVE
Specific Gravity, UA: 1.024 (ref 1.005–1.030)
Urobilinogen, Ur: 0.2 mg/dL (ref 0.2–1.0)
pH, UA: 6.5 (ref 5.0–7.5)

## 2024-11-22 ENCOUNTER — Encounter: Payer: Self-pay | Admitting: Nurse Practitioner

## 2024-11-27 ENCOUNTER — Encounter: Admitting: Obstetrics and Gynecology

## 2024-12-02 ENCOUNTER — Ambulatory Visit: Admitting: Obstetrics and Gynecology

## 2024-12-02 ENCOUNTER — Encounter: Payer: Self-pay | Admitting: *Deleted

## 2024-12-17 ENCOUNTER — Ambulatory Visit

## 2025-02-11 ENCOUNTER — Ambulatory Visit: Admitting: Nurse Practitioner

## 2025-09-30 ENCOUNTER — Ambulatory Visit: Admitting: Internal Medicine

## 2025-11-12 ENCOUNTER — Ambulatory Visit: Admitting: Nurse Practitioner
